# Patient Record
Sex: Male | Born: 1960 | Race: Black or African American | Hispanic: No | Smoking: Current every day smoker
Health system: Southern US, Community
[De-identification: ages and names within clinical notes are randomized; demographics above are authoritative.]

## PROBLEM LIST (undated history)

## (undated) DIAGNOSIS — N1831 Chronic kidney disease, stage 3a: Secondary | ICD-10-CM

## (undated) DIAGNOSIS — I214 Non-ST elevation (NSTEMI) myocardial infarction: Secondary | ICD-10-CM

## (undated) DIAGNOSIS — Z8673 Personal history of transient ischemic attack (TIA), and cerebral infarction without residual deficits: Secondary | ICD-10-CM

## (undated) DIAGNOSIS — F1910 Other psychoactive substance abuse, uncomplicated: Secondary | ICD-10-CM

## (undated) DIAGNOSIS — J45909 Unspecified asthma, uncomplicated: Secondary | ICD-10-CM

## (undated) DIAGNOSIS — F329 Major depressive disorder, single episode, unspecified: Secondary | ICD-10-CM

## (undated) DIAGNOSIS — Z9861 Coronary angioplasty status: Principal | ICD-10-CM

## (undated) DIAGNOSIS — G4733 Obstructive sleep apnea (adult) (pediatric): Secondary | ICD-10-CM

## (undated) DIAGNOSIS — M5030 Other cervical disc degeneration, unspecified cervical region: Secondary | ICD-10-CM

## (undated) DIAGNOSIS — I2699 Other pulmonary embolism without acute cor pulmonale: Secondary | ICD-10-CM

## (undated) DIAGNOSIS — Z7901 Long term (current) use of anticoagulants: Secondary | ICD-10-CM

## (undated) DIAGNOSIS — Z955 Presence of coronary angioplasty implant and graft: Secondary | ICD-10-CM

## (undated) DIAGNOSIS — F32A Depression, unspecified: Secondary | ICD-10-CM

## (undated) DIAGNOSIS — I10 Essential (primary) hypertension: Secondary | ICD-10-CM

## (undated) DIAGNOSIS — I2510 Atherosclerotic heart disease of native coronary artery without angina pectoris: Principal | ICD-10-CM

## (undated) HISTORY — DX: Personal history of transient ischemic attack (TIA), and cerebral infarction without residual deficits: Z86.73

## (undated) HISTORY — PX: TUMOR REMOVAL: SHX12

## (undated) HISTORY — DX: Atherosclerotic heart disease of native coronary artery without angina pectoris: I25.10

## (undated) HISTORY — DX: Coronary angioplasty status: Z98.61

## (undated) HISTORY — DX: Obstructive sleep apnea (adult) (pediatric): G47.33

## (undated) HISTORY — DX: Presence of coronary angioplasty implant and graft: Z95.5

## (undated) HISTORY — DX: Other pulmonary embolism without acute cor pulmonale: I26.99

## (undated) HISTORY — DX: Other psychoactive substance abuse, uncomplicated: F19.10

---

## 2009-12-10 ENCOUNTER — Emergency Department (HOSPITAL_COMMUNITY): Admission: EM | Admit: 2009-12-10 | Discharge: 2009-12-10 | Payer: Self-pay | Admitting: Emergency Medicine

## 2010-10-22 LAB — CBC
HCT: 42.9 % (ref 39.0–52.0)
MCHC: 34.8 g/dL (ref 30.0–36.0)
MCV: 94.1 fL (ref 78.0–100.0)
Platelets: 221 10*3/uL (ref 150–400)
RBC: 4.56 MIL/uL (ref 4.22–5.81)
RDW: 13.1 % (ref 11.5–15.5)

## 2010-10-22 LAB — POCT I-STAT, CHEM 8
BUN: 9 mg/dL (ref 6–23)
Calcium, Ion: 1.13 mmol/L (ref 1.12–1.32)
Creatinine, Ser: 1 mg/dL (ref 0.4–1.5)
Glucose, Bld: 91 mg/dL (ref 70–99)
HCT: 47 % (ref 39.0–52.0)
Potassium: 4.1 mEq/L (ref 3.5–5.1)
TCO2: 25 mmol/L (ref 0–100)

## 2010-10-22 LAB — POCT CARDIAC MARKERS: Troponin i, poc: <0.05 ng/mL (ref 0.00–0.09)

## 2010-10-22 LAB — RAPID URINE DRUG SCREEN, HOSP PERFORMED: Opiates: NOT DETECTED

## 2011-04-09 ENCOUNTER — Emergency Department (HOSPITAL_COMMUNITY): Payer: Medicaid Other

## 2011-04-09 ENCOUNTER — Emergency Department (HOSPITAL_COMMUNITY)
Admission: EM | Admit: 2011-04-09 | Discharge: 2011-04-09 | Disposition: A | Discharge status: Home / Self Care | Payer: Medicaid Other | Attending: Emergency Medicine | Admitting: Emergency Medicine

## 2011-04-09 DIAGNOSIS — R0602 Shortness of breath: Secondary | ICD-10-CM | POA: Insufficient documentation

## 2011-04-09 DIAGNOSIS — E119 Type 2 diabetes mellitus without complications: Secondary | ICD-10-CM | POA: Insufficient documentation

## 2011-04-09 DIAGNOSIS — R071 Chest pain on breathing: Secondary | ICD-10-CM | POA: Insufficient documentation

## 2011-04-09 DIAGNOSIS — I10 Essential (primary) hypertension: Secondary | ICD-10-CM | POA: Insufficient documentation

## 2011-04-09 LAB — COMPREHENSIVE METABOLIC PANEL
BUN: 20 mg/dL (ref 6–23)
Calcium: 9.6 mg/dL (ref 8.4–10.5)
Creatinine, Ser: 1.08 mg/dL (ref 0.50–1.35)
GFR calc Af Amer: >60 mL/min (ref >60)
GFR calc non Af Amer: >60 mL/min (ref >60)
Potassium: 3.5 mEq/L (ref 3.5–5.1)
Sodium: 142 mEq/L (ref 135–145)
Total Bilirubin: 0.5 mg/dL (ref 0.3–1.2)
Total Protein: 6.5 g/dL (ref 6.0–8.3)

## 2011-04-09 LAB — RAPID URINE DRUG SCREEN, HOSP PERFORMED
Amphetamines: NOT DETECTED
Opiates: NOT DETECTED
Tetrahydrocannabinol: POSITIVE — AB

## 2011-04-09 LAB — POCT I-STAT TROPONIN I

## 2011-11-17 ENCOUNTER — Emergency Department (HOSPITAL_COMMUNITY): Payer: Medicaid Other

## 2011-11-17 ENCOUNTER — Encounter (HOSPITAL_COMMUNITY): Payer: Self-pay | Admitting: Emergency Medicine

## 2011-11-17 ENCOUNTER — Emergency Department (HOSPITAL_COMMUNITY)
Admission: EM | Admit: 2011-11-17 | Discharge: 2011-11-17 | Disposition: A | Discharge status: Home / Self Care | Payer: Medicaid Other | Attending: Emergency Medicine | Admitting: Emergency Medicine

## 2011-11-17 DIAGNOSIS — M25519 Pain in unspecified shoulder: Secondary | ICD-10-CM | POA: Insufficient documentation

## 2011-11-17 DIAGNOSIS — F329 Major depressive disorder, single episode, unspecified: Secondary | ICD-10-CM | POA: Insufficient documentation

## 2011-11-17 DIAGNOSIS — R209 Unspecified disturbances of skin sensation: Secondary | ICD-10-CM | POA: Insufficient documentation

## 2011-11-17 DIAGNOSIS — M79609 Pain in unspecified limb: Secondary | ICD-10-CM | POA: Insufficient documentation

## 2011-11-17 DIAGNOSIS — R29898 Other symptoms and signs involving the musculoskeletal system: Secondary | ICD-10-CM | POA: Insufficient documentation

## 2011-11-17 DIAGNOSIS — M5090 Cervical disc disorder, unspecified, unspecified cervical region: Secondary | ICD-10-CM | POA: Insufficient documentation

## 2011-11-17 DIAGNOSIS — M542 Cervicalgia: Secondary | ICD-10-CM | POA: Insufficient documentation

## 2011-11-17 DIAGNOSIS — F3289 Other specified depressive episodes: Secondary | ICD-10-CM | POA: Insufficient documentation

## 2011-11-17 HISTORY — DX: Depression, unspecified: F32.A

## 2011-11-17 HISTORY — DX: Major depressive disorder, single episode, unspecified: F32.9

## 2011-11-17 LAB — DIFFERENTIAL
Basophils Absolute: 0 10*3/uL (ref 0.0–0.1)
Eosinophils Absolute: 0.4 10*3/uL (ref 0.0–0.7)
Eosinophils Relative: 4 % (ref 0–5)
Monocytes Absolute: 0.5 10*3/uL (ref 0.1–1.0)
Monocytes Relative: 6 % (ref 3–12)
Neutrophils Relative %: 56 % (ref 43–77)

## 2011-11-17 LAB — BASIC METABOLIC PANEL
BUN: 16 mg/dL (ref 6–23)
CO2: 25 mEq/L (ref 19–32)
Calcium: 8.9 mg/dL (ref 8.4–10.5)
Creatinine, Ser: 1.17 mg/dL (ref 0.50–1.35)
GFR calc non Af Amer: 72 mL/min — ABNORMAL LOW (ref >90)
Glucose, Bld: 91 mg/dL (ref 70–99)

## 2011-11-17 LAB — CBC
MCH: 32.1 pg (ref 26.0–34.0)
MCHC: 35.3 g/dL (ref 30.0–36.0)
RBC: 4.27 MIL/uL (ref 4.22–5.81)
RDW: 13.1 % (ref 11.5–15.5)
WBC: 9.2 10*3/uL (ref 4.0–10.5)

## 2011-11-17 MED ORDER — KETOROLAC TROMETHAMINE 30 MG/ML IJ SOLN
30.0000 mg | Freq: Once | INTRAMUSCULAR | Status: AC
  Administered 2011-11-17: 30 mg via INTRAVENOUS
  Filled 2011-11-17: qty 1

## 2011-11-17 MED ORDER — ONDANSETRON HCL 4 MG/2ML IJ SOLN
4.0000 mg | Freq: Once | INTRAMUSCULAR | Status: AC
  Administered 2011-11-17: 4 mg via INTRAVENOUS
  Filled 2011-11-17: qty 2

## 2011-11-17 MED ORDER — OXYCODONE-ACETAMINOPHEN 5-325 MG PO TABS
2.0000 | ORAL_TABLET | Freq: Once | ORAL | Status: AC
  Administered 2011-11-17: 2 via ORAL
  Filled 2011-11-17: qty 2

## 2011-11-17 MED ORDER — MORPHINE SULFATE 4 MG/ML IJ SOLN
4.0000 mg | Freq: Once | INTRAMUSCULAR | Status: AC
  Administered 2011-11-17: 4 mg via INTRAVENOUS
  Filled 2011-11-17: qty 1

## 2011-11-17 MED ORDER — PREDNISONE 10 MG PO TABS: 20.0000 mg | ORAL_TABLET | Freq: Two times a day (BID) | ORAL | Status: DC

## 2011-11-17 MED ORDER — METHYLPREDNISOLONE SODIUM SUCC 125 MG IJ SOLR
125.0000 mg | Freq: Once | INTRAMUSCULAR | Status: AC
  Administered 2011-11-17: 125 mg via INTRAVENOUS
  Filled 2011-11-17: qty 2

## 2011-11-17 MED ORDER — LORAZEPAM 2 MG/ML IJ SOLN
1.0000 mg | Freq: Once | INTRAMUSCULAR | Status: AC
  Administered 2011-11-17: 1 mg via INTRAVENOUS
  Filled 2011-11-17: qty 1

## 2011-11-17 MED ORDER — HYDROCODONE-ACETAMINOPHEN 5-325 MG PO TABS: 1.0000 | ORAL_TABLET | Freq: Three times a day (TID) | ORAL | Status: AC | PRN

## 2011-11-17 NOTE — ED Notes (Signed)
Cp for 2days and progressively got worst this evening, CP with numbness and tingling to left side. Pain scale 10/10, ASA 324 mg  And 0.4nitro SL given by EMS.

## 2011-11-17 NOTE — ED Provider Notes (Signed)
Pt has a cervical disc protrusion.  Spoke with dr. Dutch Quint who will see the pt in the office in 2 days.  Pt to begin steroids  Benny Lennert, MD 11/17/11 [phone]
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
## 2011-11-17 NOTE — Discharge Instructions (Signed)
Follow up with dr. Dutch Quint in 2-3 days

## 2011-11-17 NOTE — ED Notes (Signed)
Patient transported to MRI 

## 2011-11-21 ENCOUNTER — Encounter (HOSPITAL_COMMUNITY): Payer: Self-pay | Admitting: Pharmacy Technician

## 2011-11-24 ENCOUNTER — Other Ambulatory Visit: Payer: Self-pay | Admitting: Neurosurgery

## 2011-11-24 NOTE — Pre-Procedure Instructions (Signed)
20 Timothy Peck  11/24/2011   Your procedure is scheduled on:  Friday November 28, 2011  Report to [facility] at 1:20pm .  Call this number if you have problems the morning of surgery: [phone]   Remember:   Do not eat food:After Midnight.  May have clear liquids: up to 4 Hours before arrival. (up to 9:20am)  Clear liquids include soda, tea, black coffee, apple or grape juice, broth.  Take these medicines the morning of surgery with A SIP OF WATER: hydrocodone, prednisone, paxil, seroquel     Do not wear lotions, powders, or perfumes. You may wear deodorant.  Do not shave 48 hours prior to surgery.  Do not bring valuables to the hospital.  Contacts, dentures or bridgework may not be worn into surgery.  Leave suitcase in the car. After surgery it may be brought to your room.  For patients admitted to the hospital, checkout time is 11:00 AM the day of discharge.   Patients discharged the day of surgery will not be allowed to drive home.  Name and phone number of your driver:   Special Instructions: CHG Shower Use Special Wash: 1/2 bottle night before surgery and 1/2 bottle morning of surgery.   Please read over the following fact sheets that you were given: Pain Booklet, Coughing and Deep Breathing, MRSA Information and Surgical Site Infection Prevention

## 2011-11-25 ENCOUNTER — Encounter (HOSPITAL_COMMUNITY): Payer: Self-pay

## 2011-11-25 ENCOUNTER — Encounter (HOSPITAL_COMMUNITY)
Admission: RE | Admit: 2011-11-25 | Discharge: 2011-11-25 | Disposition: A | Discharge status: Home / Self Care | Payer: Medicaid Other | Source: Ambulatory Visit | Attending: Neurosurgery | Admitting: Neurosurgery

## 2011-11-25 HISTORY — DX: Other cervical disc degeneration, unspecified cervical region: M50.30

## 2011-11-25 LAB — COMPREHENSIVE METABOLIC PANEL
Albumin: 4 g/dL (ref 3.5–5.2)
Alkaline Phosphatase: 66 U/L (ref 39–117)
BUN: 20 mg/dL (ref 6–23)
Calcium: 9.5 mg/dL (ref 8.4–10.5)
Creatinine, Ser: 1.28 mg/dL (ref 0.50–1.35)
GFR calc Af Amer: 75 mL/min — ABNORMAL LOW (ref >90)
Glucose, Bld: 80 mg/dL (ref 70–99)
Potassium: 4.1 mEq/L (ref 3.5–5.1)
Total Protein: 7 g/dL (ref 6.0–8.3)

## 2011-11-25 LAB — CBC
HCT: 44.7 % (ref 39.0–52.0)
Hemoglobin: 15.6 g/dL (ref 13.0–17.0)
MCV: 92.9 fL (ref 78.0–100.0)
RBC: 4.81 MIL/uL (ref 4.22–5.81)
WBC: 11 10*3/uL — ABNORMAL HIGH (ref 4.0–10.5)

## 2011-11-25 LAB — DIFFERENTIAL
Eosinophils Absolute: 0.4 10*3/uL (ref 0.0–0.7)
Eosinophils Relative: 3 % (ref 0–5)
Lymphocytes Relative: 29 % (ref 12–46)
Lymphs Abs: 3.2 10*3/uL (ref 0.7–4.0)
Monocytes Absolute: 0.9 10*3/uL (ref 0.1–1.0)

## 2011-11-25 LAB — SURGICAL PCR SCREEN: MRSA, PCR: NEGATIVE

## 2011-11-28 ENCOUNTER — Encounter (HOSPITAL_COMMUNITY): Payer: Self-pay | Admitting: Anesthesiology

## 2011-11-28 ENCOUNTER — Inpatient Hospital Stay (HOSPITAL_COMMUNITY)
Admission: RE | Admit: 2011-11-28 | Discharge: 2011-11-29 | DRG: 473 | Disposition: A | Discharge status: Home / Self Care | Payer: Medicaid Other | Source: Ambulatory Visit | Attending: Neurosurgery | Admitting: Neurosurgery

## 2011-11-28 ENCOUNTER — Ambulatory Visit (HOSPITAL_COMMUNITY): Payer: Medicaid Other | Admitting: Anesthesiology

## 2011-11-28 ENCOUNTER — Encounter (HOSPITAL_COMMUNITY)
Admission: RE | Disposition: A | Discharge status: Home / Self Care | Payer: Self-pay | Source: Ambulatory Visit | Attending: Neurosurgery

## 2011-11-28 ENCOUNTER — Encounter (HOSPITAL_COMMUNITY): Payer: Self-pay | Admitting: *Deleted

## 2011-11-28 ENCOUNTER — Ambulatory Visit (HOSPITAL_COMMUNITY): Payer: Medicaid Other

## 2011-11-28 DIAGNOSIS — M5020 Other cervical disc displacement, unspecified cervical region: Principal | ICD-10-CM | POA: Diagnosis present

## 2011-11-28 DIAGNOSIS — F329 Major depressive disorder, single episode, unspecified: Secondary | ICD-10-CM | POA: Diagnosis present

## 2011-11-28 DIAGNOSIS — F1410 Cocaine abuse, uncomplicated: Secondary | ICD-10-CM | POA: Diagnosis present

## 2011-11-28 DIAGNOSIS — F17200 Nicotine dependence, unspecified, uncomplicated: Secondary | ICD-10-CM | POA: Diagnosis present

## 2011-11-28 DIAGNOSIS — IMO0002 Reserved for concepts with insufficient information to code with codable children: Secondary | ICD-10-CM

## 2011-11-28 DIAGNOSIS — M5010 Cervical disc disorder with radiculopathy, unspecified cervical region: Secondary | ICD-10-CM | POA: Diagnosis present

## 2011-11-28 DIAGNOSIS — Z01812 Encounter for preprocedural laboratory examination: Secondary | ICD-10-CM

## 2011-11-28 DIAGNOSIS — F3289 Other specified depressive episodes: Secondary | ICD-10-CM | POA: Diagnosis present

## 2011-11-28 DIAGNOSIS — F1210 Cannabis abuse, uncomplicated: Secondary | ICD-10-CM | POA: Diagnosis present

## 2011-11-28 HISTORY — PX: ANTERIOR CERVICAL DECOMP/DISCECTOMY FUSION: SHX1161

## 2011-11-28 SURGERY — ANTERIOR CERVICAL DECOMPRESSION/DISCECTOMY FUSION 1 LEVEL
Anesthesia: General | Site: Neck | Wound class: Clean

## 2011-11-28 MED ORDER — 0.9 % SODIUM CHLORIDE (POUR BTL) OPTIME
TOPICAL | Status: DC | PRN
  Administered 2011-11-28: 1000 mL

## 2011-11-28 MED ORDER — PROPOFOL 10 MG/ML IV BOLUS
INTRAVENOUS | Status: DC | PRN
  Administered 2011-11-28: 50 mg via INTRAVENOUS
  Administered 2011-11-28: 150 mg via INTRAVENOUS

## 2011-11-28 MED ORDER — ALUM & MAG HYDROXIDE-SIMETH 200-200-20 MG/5ML PO SUSP: 30.0000 mL | Freq: Four times a day (QID) | ORAL | Status: DC | PRN

## 2011-11-28 MED ORDER — FENTANYL CITRATE 0.05 MG/ML IJ SOLN
INTRAMUSCULAR | Status: DC | PRN
  Administered 2011-11-28: 125 ug via INTRAVENOUS
  Administered 2011-11-28 (×2): 100 ug via INTRAVENOUS
  Administered 2011-11-28: 125 ug via INTRAVENOUS
  Administered 2011-11-28: 50 ug via INTRAVENOUS

## 2011-11-28 MED ORDER — ROCURONIUM BROMIDE 100 MG/10ML IV SOLN
INTRAVENOUS | Status: DC | PRN
  Administered 2011-11-28: 50 mg via INTRAVENOUS

## 2011-11-28 MED ORDER — POLYETHYLENE GLYCOL 3350 17 G PO PACK
17.0000 g | PACK | Freq: Every day | ORAL | Status: DC | PRN
  Filled 2011-11-28: qty 1

## 2011-11-28 MED ORDER — PHENOL 1.4 % MT LIQD: 1.0000 | OROMUCOSAL | Status: DC | PRN

## 2011-11-28 MED ORDER — HYDROMORPHONE HCL PF 1 MG/ML IJ SOLN: 0.2500 mg | INTRAMUSCULAR | Status: DC | PRN

## 2011-11-28 MED ORDER — BACITRACIN 50000 UNITS IM SOLR
INTRAMUSCULAR | Status: AC
  Filled 2011-11-28: qty 1

## 2011-11-28 MED ORDER — ONDANSETRON HCL 4 MG/2ML IJ SOLN: 4.0000 mg | Freq: Once | INTRAMUSCULAR | Status: DC | PRN

## 2011-11-28 MED ORDER — FLEET ENEMA 7-19 GM/118ML RE ENEM
1.0000 | ENEMA | Freq: Once | RECTAL | Status: AC | PRN
  Filled 2011-11-28: qty 1

## 2011-11-28 MED ORDER — CEFAZOLIN SODIUM 1-5 GM-% IV SOLN
1.0000 g | Freq: Three times a day (TID) | INTRAVENOUS | Status: DC
  Administered 2011-11-29: 1 g via INTRAVENOUS
  Filled 2011-11-28 (×2): qty 50

## 2011-11-28 MED ORDER — ONDANSETRON HCL 4 MG/2ML IJ SOLN
4.0000 mg | INTRAMUSCULAR | Status: DC | PRN
  Administered 2011-11-28 – 2011-11-29 (×2): 4 mg via INTRAVENOUS
  Filled 2011-11-28 (×2): qty 2

## 2011-11-28 MED ORDER — PAROXETINE HCL 10 MG PO TABS
10.0000 mg | ORAL_TABLET | ORAL | Status: DC
  Administered 2011-11-29: 10 mg via ORAL
  Filled 2011-11-28 (×2): qty 1

## 2011-11-28 MED ORDER — ZOLPIDEM TARTRATE 5 MG PO TABS: 10.0000 mg | ORAL_TABLET | Freq: Every evening | ORAL | Status: DC | PRN

## 2011-11-28 MED ORDER — HYDROMORPHONE HCL PF 1 MG/ML IJ SOLN
0.2500 mg | INTRAMUSCULAR | Status: DC | PRN
  Administered 2011-11-28: 0.5 mg via INTRAVENOUS

## 2011-11-28 MED ORDER — ACETAMINOPHEN 650 MG RE SUPP: 650.0000 mg | RECTAL | Status: DC | PRN

## 2011-11-28 MED ORDER — BISACODYL 10 MG RE SUPP: 10.0000 mg | Freq: Every day | RECTAL | Status: DC | PRN

## 2011-11-28 MED ORDER — SODIUM CHLORIDE 0.9 % IV SOLN
INTRAVENOUS | Status: AC
  Filled 2011-11-28: qty 500

## 2011-11-28 MED ORDER — SODIUM CHLORIDE 0.9 % IV SOLN: 250.0000 mL | INTRAVENOUS | Status: DC

## 2011-11-28 MED ORDER — MORPHINE SULFATE 2 MG/ML IJ SOLN: 0.0500 mg/kg | INTRAMUSCULAR | Status: DC | PRN

## 2011-11-28 MED ORDER — ACETAMINOPHEN 325 MG PO TABS: 650.0000 mg | ORAL_TABLET | ORAL | Status: DC | PRN

## 2011-11-28 MED ORDER — GLYCOPYRROLATE 0.2 MG/ML IJ SOLN
INTRAMUSCULAR | Status: DC | PRN
  Administered 2011-11-28: 0.4 mg via INTRAVENOUS

## 2011-11-28 MED ORDER — MENTHOL 3 MG MT LOZG: 1.0000 | LOZENGE | OROMUCOSAL | Status: DC | PRN

## 2011-11-28 MED ORDER — LACTATED RINGERS IV SOLN
INTRAVENOUS | Status: DC | PRN
  Administered 2011-11-28 (×2): via INTRAVENOUS

## 2011-11-28 MED ORDER — HEMOSTATIC AGENTS (NO CHARGE) OPTIME
TOPICAL | Status: DC | PRN
  Administered 2011-11-28: 1 via TOPICAL

## 2011-11-28 MED ORDER — SENNA 8.6 MG PO TABS
1.0000 | ORAL_TABLET | Freq: Two times a day (BID) | ORAL | Status: DC
  Filled 2011-11-28 (×2): qty 1

## 2011-11-28 MED ORDER — HYDROMORPHONE HCL PF 1 MG/ML IJ SOLN
0.5000 mg | INTRAMUSCULAR | Status: DC | PRN
  Administered 2011-11-28 – 2011-11-29 (×2): 1 mg via INTRAVENOUS
  Filled 2011-11-28 (×2): qty 1

## 2011-11-28 MED ORDER — SODIUM CHLORIDE 0.9 % IR SOLN
Status: DC | PRN
  Administered 2011-11-28: 19:00:00

## 2011-11-28 MED ORDER — OXYCODONE-ACETAMINOPHEN 5-325 MG PO TABS
1.0000 | ORAL_TABLET | ORAL | Status: DC | PRN
  Administered 2011-11-29 (×2): 2 via ORAL
  Filled 2011-11-28 (×2): qty 2

## 2011-11-28 MED ORDER — MIDAZOLAM HCL 5 MG/5ML IJ SOLN
INTRAMUSCULAR | Status: DC | PRN
  Administered 2011-11-28: 2 mg via INTRAVENOUS

## 2011-11-28 MED ORDER — CEFAZOLIN SODIUM 1-5 GM-% IV SOLN
INTRAVENOUS | Status: DC | PRN
  Administered 2011-11-28: 2 g via INTRAVENOUS

## 2011-11-28 MED ORDER — THROMBIN 5000 UNITS EX SOLR
CUTANEOUS | Status: DC | PRN
  Administered 2011-11-28 (×2): 5000 [IU] via TOPICAL

## 2011-11-28 MED ORDER — SODIUM CHLORIDE 0.9 % IJ SOLN: 3.0000 mL | Freq: Two times a day (BID) | INTRAMUSCULAR | Status: DC

## 2011-11-28 MED ORDER — CYCLOBENZAPRINE HCL 10 MG PO TABS
10.0000 mg | ORAL_TABLET | Freq: Three times a day (TID) | ORAL | Status: DC | PRN
  Administered 2011-11-29 (×2): 10 mg via ORAL
  Filled 2011-11-28 (×2): qty 1

## 2011-11-28 MED ORDER — DROPERIDOL 2.5 MG/ML IJ SOLN
0.6250 mg | INTRAMUSCULAR | Status: DC | PRN
  Filled 2011-11-28: qty 0.25

## 2011-11-28 MED ORDER — HYDROCODONE-ACETAMINOPHEN 5-325 MG PO TABS: 1.0000 | ORAL_TABLET | ORAL | Status: DC | PRN

## 2011-11-28 MED ORDER — SODIUM CHLORIDE 0.9 % IJ SOLN: 3.0000 mL | INTRAMUSCULAR | Status: DC | PRN

## 2011-11-28 MED ORDER — NEOSTIGMINE METHYLSULFATE 1 MG/ML IJ SOLN
INTRAMUSCULAR | Status: DC | PRN
  Administered 2011-11-28: 2 mg via INTRAVENOUS

## 2011-11-28 MED ORDER — QUETIAPINE FUMARATE ER 300 MG PO TB24
300.0000 mg | ORAL_TABLET | Freq: Every day | ORAL | Status: DC
Start: 2011-11-28 — End: 2011-11-29
  Filled 2011-11-28 (×2): qty 1

## 2011-11-28 SURGICAL SUPPLY — 54 items
BAG DECANTER FOR FLEXI CONT (MISCELLANEOUS) ×2 IMPLANT
BENZOIN TINCTURE PRP APPL 2/3 (GAUZE/BANDAGES/DRESSINGS) ×2 IMPLANT
BRUSH SCRUB EZ PLAIN DRY (MISCELLANEOUS) ×2 IMPLANT
BUR MATCHSTICK NEURO 3.0 LAGG (BURR) ×2 IMPLANT
CANISTER SUCTION 2500CC (MISCELLANEOUS) ×2 IMPLANT
CLOTH BEACON ORANGE TIMEOUT ST (SAFETY) ×2 IMPLANT
CONT SPEC 4OZ CLIKSEAL STRL BL (MISCELLANEOUS) ×2 IMPLANT
DRAPE C-ARM 42X72 X-RAY (DRAPES) ×4 IMPLANT
DRAPE INCISE IOBAN 66X45 STRL (DRAPES) ×2 IMPLANT
DRAPE LAPAROTOMY 100X72 PEDS (DRAPES) ×2 IMPLANT
DRAPE MICROSCOPE ZEISS OPMI (DRAPES) ×2 IMPLANT
DRAPE POUCH INSTRU U-SHP 10X18 (DRAPES) ×2 IMPLANT
DRILL BIT (BIT) ×2 IMPLANT
ELECT COATED BLADE 2.86 ST (ELECTRODE) ×2 IMPLANT
ELECT REM PT RETURN 9FT ADLT (ELECTROSURGICAL) ×2
ELECTRODE REM PT RTRN 9FT ADLT (ELECTROSURGICAL) ×1 IMPLANT
GAUZE SPONGE 4X4 16PLY XRAY LF (GAUZE/BANDAGES/DRESSINGS) IMPLANT
GLOVE BIO SURGEON STRL SZ 6.5 (GLOVE) ×2 IMPLANT
GLOVE BIOGEL PI IND STRL 6.5 (GLOVE) ×1 IMPLANT
GLOVE BIOGEL PI IND STRL 7.5 (GLOVE) ×1 IMPLANT
GLOVE BIOGEL PI INDICATOR 6.5 (GLOVE) ×1
GLOVE BIOGEL PI INDICATOR 7.5 (GLOVE) ×1
GLOVE ECLIPSE 8.5 STRL (GLOVE) ×2 IMPLANT
GLOVE EXAM NITRILE LRG STRL (GLOVE) IMPLANT
GLOVE EXAM NITRILE MD LF STRL (GLOVE) IMPLANT
GLOVE EXAM NITRILE XL STR (GLOVE) IMPLANT
GLOVE EXAM NITRILE XS STR PU (GLOVE) IMPLANT
GOWN BRE IMP SLV AUR LG STRL (GOWN DISPOSABLE) ×2 IMPLANT
GOWN BRE IMP SLV AUR XL STRL (GOWN DISPOSABLE) ×4 IMPLANT
GOWN STRL REIN 2XL LVL4 (GOWN DISPOSABLE) IMPLANT
HEAD HALTER (SOFTGOODS) ×2 IMPLANT
KIT BASIN OR (CUSTOM PROCEDURE TRAY) ×2 IMPLANT
KIT ROOM TURNOVER OR (KITS) ×2 IMPLANT
NEEDLE SPNL 20GX3.5 QUINCKE YW (NEEDLE) ×2 IMPLANT
NS IRRIG 1000ML POUR BTL (IV SOLUTION) ×2 IMPLANT
PACK LAMINECTOMY NEURO (CUSTOM PROCEDURE TRAY) ×4 IMPLANT
PAD ARMBOARD 7.5X6 YLW CONV (MISCELLANEOUS) ×4 IMPLANT
PLATE ELITE VISION 25MM (Plate) ×2 IMPLANT
RUBBERBAND STERILE (MISCELLANEOUS) ×4 IMPLANT
SCREW ST 13X4XST VA NS SPNE (Screw) ×4 IMPLANT
SCREW ST VAR 4 ATL (Screw) ×4 IMPLANT
SPACER BONE CORNERSTONE 8X14 (Orthopedic Implant) ×2 IMPLANT
SPONGE GAUZE 4X4 12PLY (GAUZE/BANDAGES/DRESSINGS) ×2 IMPLANT
SPONGE INTESTINAL PEANUT (DISPOSABLE) ×2 IMPLANT
SPONGE SURGIFOAM ABS GEL SZ50 (HEMOSTASIS) ×2 IMPLANT
STRIP CLOSURE SKIN 1/2X4 (GAUZE/BANDAGES/DRESSINGS) ×2 IMPLANT
SUT PDS AB 5-0 P3 18 (SUTURE) ×2 IMPLANT
SUT VIC AB 3-0 SH 8-18 (SUTURE) ×2 IMPLANT
SYR 20ML ECCENTRIC (SYRINGE) ×2 IMPLANT
TAPE CLOTH 4X10 WHT NS (GAUZE/BANDAGES/DRESSINGS) ×2 IMPLANT
TAPE CLOTH SURG 4X10 WHT LF (GAUZE/BANDAGES/DRESSINGS) ×2 IMPLANT
TOWEL OR 17X24 6PK STRL BLUE (TOWEL DISPOSABLE) ×2 IMPLANT
TOWEL OR 17X26 10 PK STRL BLUE (TOWEL DISPOSABLE) ×2 IMPLANT
WATER STERILE IRR 1000ML POUR (IV SOLUTION) ×2 IMPLANT

## 2011-11-28 NOTE — H&P (Signed)
Timothy Peck is an 51 y.o. male.   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 Patient presents now for anterior cervical discectomy and fusion with allograft and anterior plating in hopes of improving his symptoms.  Past Medical History  Diagnosis Date  . Depression   . Degenerative disc disease, cervical     Dx years ago    Past Surgical History  Procedure Date  . Tumor removal     left foot third toe    Family History  Problem Relation Age of Onset  . Anesthesia problems Neg Hx   . Hypotension Neg Hx   . Malignant hyperthermia Neg Hx   . Pseudochol deficiency Neg Hx    Social History:  reports that he has been smoking Cigarettes.  He has a 4.5 pack-year smoking history. He has never used smokeless tobacco. He reports that he drinks alcohol. He reports that he uses illicit drugs (Cocaine and Marijuana).  Allergies: No Known Allergies  Medications Prior to Admission  Medication Sig Dispense Refill  . HYDROcodone-acetaminophen (NORCO) 5-325 MG per tablet Take 1 tablet by mouth every 8 (eight) hours as needed for pain.  20 tablet  0  . ibuprofen (ADVIL,MOTRIN) 200 MG tablet Take 800 mg by mouth every 6 (six) hours as needed. For pain      . Multiple Vitamin (MULITIVITAMIN WITH MINERALS) TABS Take 1 tablet by mouth daily.      Marland Kitchen oxyCODONE-acetaminophen (PERCOCET) 5-325 MG per tablet Take 1-2 tablets by mouth every 4 (four) hours as needed. For pain      . PARoxetine (PAXIL) 10 MG tablet Take 10 mg by mouth every morning.      . predniSONE (DELTASONE) 10 MG tablet Take 2 tablets (20 mg total) by mouth 2 (two) times daily.  15 tablet  0  . QUEtiapine (SEROQUEL XR) 300 MG 24 hr tablet Take 300 mg by mouth at bedtime.      . Ascorbic Acid (VITAMIN C) 1000 MG tablet Take 1,000 mg by mouth daily.          No results found for this or any previous visit (from the past 48 hour(s)). No results found.  Review of Systems  Constitutional: Negative.   HENT: Negative.   Eyes: Negative.   Respiratory: Negative.   Cardiovascular: Negative.   Gastrointestinal: Negative.   Genitourinary: Negative.   Musculoskeletal: Negative.   Skin: Negative.   Neurological: Negative.   Endo/Heme/Allergies: Negative.   Psychiatric/Behavioral: Negative.     Blood pressure 151/92, pulse 47, temperature 97.9 F (36.6 C), temperature source Oral, resp. rate 18, SpO2 100.00%. Physical Exam  Constitutional: He is oriented to person, place, and time. He appears well-developed and well-nourished.  HENT:  Head: Normocephalic and atraumatic.  Right Ear: External ear normal.  Left Ear: External ear normal.  Nose: Nose normal.  Mouth/Throat: Oropharynx is clear and moist.  Eyes: Conjunctivae and EOM are normal. Pupils are equal, round, and reactive to light.  Neck: Normal range of motion. Neck supple. No tracheal deviation present. No thyromegaly present.  Cardiovascular: Normal rate, regular rhythm, normal heart sounds and intact distal pulses.   Respiratory: Effort normal and breath sounds normal. No respiratory distress. He has no wheezes.  GI: Soft. Bowel sounds are normal. He exhibits no distension. There is no tenderness.  Musculoskeletal: Normal  range of motion.  Neurological: He is alert and oriented to person, place, and time. He has normal reflexes. He displays normal reflexes. No cranial nerve deficit. He exhibits normal muscle tone. Coordination normal.  Skin: Skin is warm and dry. No rash noted. No erythema. No pallor.  Psychiatric: He has a normal mood and affect. His behavior is normal. Judgment and thought content normal.     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 Plan C6-7 anterior cervical discectomy and fusion with allograft and anterior plating. Risks and  benefits have been explained. Patient wishes to proceed.  Moesha Sarchet A 11/28/2011, 12:38 PM

## 2011-11-28 NOTE — Anesthesia Procedure Notes (Signed)
Procedure Name: Intubation Date/Time: 11/28/2011 6:58 PM Performed by: Alanda Amass A Pre-anesthesia Checklist: Patient identified, Timeout performed, Emergency Drugs available, Patient being monitored and Suction available Patient Re-evaluated:Patient Re-evaluated prior to inductionOxygen Delivery Method: Circle system utilized Preoxygenation: Pre-oxygenation with 100% oxygen Intubation Type: IV induction Ventilation: Mask ventilation without difficulty Laryngoscope Size: Mac and 3 Grade View: Grade II Tube type: Oral Tube size: 7.5 mm Number of attempts: 1 Airway Equipment and Method: Stylet Placement Confirmation: ETT inserted through vocal cords under direct vision,  positive ETCO2 and breath sounds checked- equal and bilateral Secured at: 22 cm Tube secured with: Tape Dental Injury: Teeth and Oropharynx as per pre-operative assessment

## 2011-11-28 NOTE — Brief Op Note (Signed)
11/28/2011  7:59 PM  PATIENT:  Timothy Peck  51 y.o. male  PRE-OPERATIVE DIAGNOSIS:  HNP  POST-OPERATIVE DIAGNOSIS:  HNP  PROCEDURE:  Procedure(s) (LRB): ANTERIOR CERVICAL DECOMPRESSION/DISCECTOMY FUSION 1 LEVEL (N/A)  SURGEON:  Surgeon(s) and Role:    * Temple Pacini, MD - Primary  PHYSICIAN ASSISTANT:   ASSISTANTS: none   ANESTHESIA:   general  EBL:  Total I/O In: 1400 [I.V.:1400] Out: 15 [Blood:15]  BLOOD ADMINISTERED:none  DRAINS: none   LOCAL MEDICATIONS USED:  NONE  SPECIMEN:  No Specimen  DISPOSITION OF SPECIMEN:  N/A  COUNTS:  YES  TOURNIQUET:  * No tourniquets in log *  DICTATION: .Dragon Dictation  PLAN OF CARE: Admit to inpatient   PATIENT DISPOSITION:  PACU - hemodynamically stable.   Delay start of Pharmacological VTE agent (>24hrs) due to surgical blood loss or risk of bleeding: yes

## 2011-11-28 NOTE — Preoperative (Signed)
Beta Blockers   Reason not to administer Beta Blockers:Not Applicable 

## 2011-11-28 NOTE — Transfer of Care (Signed)
Immediate Anesthesia Transfer of Care Note  Patient: Timothy Peck  Procedure(s) Performed: Procedure(s) (LRB): ANTERIOR CERVICAL DECOMPRESSION/DISCECTOMY FUSION 1 LEVEL (N/A)  Patient Location: PACU  Anesthesia Type: General  Level of Consciousness: awake  Airway & Oxygen Therapy: Patient Spontanous Breathing and Patient connected to nasal cannula oxygen  Post-op Assessment: Report given to PACU RN and Post -op Vital signs reviewed and stable  Post vital signs: Reviewed and stable  Complications: No apparent anesthesia complications

## 2011-11-28 NOTE — Anesthesia Postprocedure Evaluation (Signed)
Anesthesia Post Note  Patient: Timothy Peck  Procedure(s) Performed: Procedure(s) (LRB): ANTERIOR CERVICAL DECOMPRESSION/DISCECTOMY FUSION 1 LEVEL (N/A)  Anesthesia type: general  Patient location: PACU  Post pain: Pain level controlled  Post assessment: Patient's Cardiovascular Status Stable  Last Vitals:  Filed Vitals:   11/28/11 2055  BP: 144/61  Pulse: 51  Temp:   Resp: 9    Post vital signs: Reviewed and stable  Level of consciousness: sedated  Complications: No apparent anesthesia complications

## 2011-11-28 NOTE — Anesthesia Preprocedure Evaluation (Addendum)
Anesthesia Evaluation  Patient identified by MRN, date of birth, ID band Patient awake    Reviewed: Allergy & Precautions, H&P , NPO status , Patient's Chart, lab work & pertinent test results  Airway Mallampati: I TM Distance: >3 FB Neck ROM: Full    Dental  (+) Teeth Intact and Dental Advisory Given   Pulmonary  breath sounds clear to auscultation        Cardiovascular Rhythm:Regular Rate:Normal     Neuro/Psych    GI/Hepatic   Endo/Other    Renal/GU      Musculoskeletal   Abdominal   Peds  Hematology   Anesthesia Other Findings   Reproductive/Obstetrics                           Anesthesia Physical Anesthesia Plan  ASA: II  Anesthesia Plan: General   Post-op Pain Management:    Induction: Intravenous  Airway Management Planned: Oral ETT  Additional Equipment:   Intra-op Plan:   Post-operative Plan: Extubation in OR  Informed Consent: I have reviewed the patients History and Physical, chart, labs and discussed the procedure including the risks, benefits and alternatives for the proposed anesthesia with the patient or authorized representative who has indicated his/her understanding and acceptance.   Dental advisory given  Plan Discussed with: CRNA, Anesthesiologist and Surgeon  Anesthesia Plan Comments:         Anesthesia Quick Evaluation  

## 2011-11-28 NOTE — Op Note (Signed)
Date of procedure: 11/28/2011  Date of dictation: Same  Service: Neurosurgery  Preoperative diagnosis: Left C6-7 herniated nucleus pulposus with radiculopathy  Postoperative diagnosis: Same  Procedure Name: C6-7 anterior cervical setting fusion with allograft and anterior plating  Surgeon:Tally Mattox A.Yanuel Tagg, M.D.  Asst. Surgeon: None  Anesthesia: General  Indication: 51 year old male with history of neck and left upper journey pain paresthesias and weakness consistent with a left-sided C7 her a copy her workup and straight a large left-sided C6-7 disc herniation with spinal cord and nerve root compression. Patient presents now for C6-7 anterior cervical facet fusion with allograft and plating in hopes of improving his symptoms.  Operative note: After induction anesthesia, patient is a supine with Extended and held in place with halter traction. Patient's anterior service prepped draped sterilely. Incision was made overlying the C6-7 interspace. This carried down sharply to the platysma. This is then divided vertically and dissection proceeded on U. border of the sternocleidomastoid muscle and carotid sheath. Trachea and esophagus mobilized track towards the left. Prevertebral fascia proximal anterior spinal column. Longus colli muscles of elevated bilaterally/Carter. Deep self retaining retractors placed intraoperative fluoroscopy is used levels was confirmed. The space and incised 15 blade in a rectangular fashion. Y. disc space cleanout was achieved using pituitary rongeurs forward and backward angle Carlen curettes Kerrison rongeurs the high-speed drill. Almost the disc were removed down to the level posterior annulus. Marked and brought field these at the remainder of the discectomy. Remaining aspects of annulus and osteophytes removed using a high-speed drill. Was elevated and resected in the fashion using Kerrison rongeurs. Underlying thecal sac then identified. Wide decompression and perform  undercut the bodies of C6 and C7. Decompression and CA chauffeuring provider foraminotomies and portal along the course the exiting C7 nerve roots bilaterally probably to the disc herniation of the left side were completely resected this point a very thorough depression achieved. There is noted of injury to thecal sac and nerve roots. Wound is then irrigated. Cornerstone allograft wedges and packed in place recessed off the 1 mm and the intervertebral margin tear 25 mm Atlantis anterior cervical plate was a posterior the C6 and C7 levels. This attached under fluoroscopic guidance using 13 mm  variable angle screws to each of both levels. All screws final tightening be solidly within bone. Locking screws engaged both levels. Final images real good position the graft or proper level with normal S1. Wound is then irrigated and bike solution. Hemostasis was assured with bipolar cautery. Wounds and closed in a typical fashion. Steri-Strips and a sterile dressing were applied. The patient tolerated the procedure well. He returns to the recovery room postop.

## 2011-11-28 NOTE — Progress Notes (Signed)
Orthopedic Tech Progress Note Patient Details:  Timothy Peck 1961/02/21 [id number]  Other Ortho Devices Ortho Device Location: soft collar Ortho Device Interventions: Application   Timothy Peck, Timothy Peck 11/28/2011, 12:53 PM

## 2011-11-29 MED ORDER — DIAZEPAM 5 MG PO TABS: 5.0000 mg | ORAL_TABLET | Freq: Four times a day (QID) | ORAL | Status: DC | PRN

## 2011-11-29 MED ORDER — OXYCODONE-ACETAMINOPHEN 5-325 MG PO TABS: 1.0000 | ORAL_TABLET | ORAL | Status: DC | PRN

## 2011-11-29 NOTE — Discharge Summary (Signed)
Discharge summary: Admitting diagnosis: Herniated nucleus pulposus C6-7 left with left cervical radiculopathy Discharge diagnosis: Herniated nucleus pulposus C6-C7 left with left cervical radiculopathy Operation: Anterior cervical decompression C6-C7 arthrodesis with structural allograft, anterior plate fixation B1-Y7 Condition on discharge: Improved Discharge medications: Percocet 5/325 #40 without refills Valium 5 mg #30 without refills Hospital course: Uncomplicated

## 2011-11-29 NOTE — Discharge Instructions (Signed)
Wound Care Keep incision covered and dry for one week.  If you shower prior to then, cover incision with plastic wrap.  You may remove outer bandage after one week and shower.  Do not put any creams, lotions, or ointments on incision. Leave steri-strips on neck.  They will fall off by themselves. Activity Walk each and every day, increasing distance each day. No lifting greater than 5 lbs.  Avoid excessive neck motion. No driving for 2 weeks; may ride as a passenger locally. Wear neck brace at all times except when showering. Diet Resume your normal diet.  Return to Work Will be discussed at you follow up appointment. Call Your Doctor If Any of These Occur Redness, drainage, or swelling at the wound.  Temperature greater than 101 degrees. Severe pain not relieved by pain medication. Increased difficulty swallowing.  Incision starts to come apart. Follow Up Appt Call today for appointment in 1-2 weeks ([phone]) or for problems.  If you have any hardware placed in your spine, you will need an x-ray before your appointment. 

## 2011-12-01 ENCOUNTER — Encounter (HOSPITAL_COMMUNITY): Payer: Self-pay | Admitting: Neurosurgery

## 2011-12-07 ENCOUNTER — Encounter (HOSPITAL_COMMUNITY): Payer: Self-pay | Admitting: Emergency Medicine

## 2011-12-07 ENCOUNTER — Emergency Department (HOSPITAL_COMMUNITY)
Admission: EM | Admit: 2011-12-07 | Discharge: 2011-12-07 | Disposition: A | Discharge status: Home / Self Care | Payer: Medicaid Other | Attending: Emergency Medicine | Admitting: Emergency Medicine

## 2011-12-07 DIAGNOSIS — M542 Cervicalgia: Secondary | ICD-10-CM

## 2011-12-07 DIAGNOSIS — M5030 Other cervical disc degeneration, unspecified cervical region: Secondary | ICD-10-CM | POA: Insufficient documentation

## 2011-12-07 DIAGNOSIS — F17200 Nicotine dependence, unspecified, uncomplicated: Secondary | ICD-10-CM | POA: Insufficient documentation

## 2011-12-07 DIAGNOSIS — M79609 Pain in unspecified limb: Secondary | ICD-10-CM | POA: Insufficient documentation

## 2011-12-07 MED ORDER — OXYCODONE-ACETAMINOPHEN 5-325 MG PO TABS: 1.0000 | ORAL_TABLET | ORAL | Status: AC | PRN

## 2011-12-07 MED ORDER — OXYCODONE-ACETAMINOPHEN 5-325 MG PO TABS
1.0000 | ORAL_TABLET | Freq: Once | ORAL | Status: AC
  Administered 2011-12-07: 1 via ORAL
  Filled 2011-12-07: qty 1

## 2011-12-07 NOTE — Discharge Instructions (Signed)
Please call Dr. Lindalou Hose office tomorrow to obtain an appointment this week for reevaluation.  Cervical Radiculopathy Cervical radiculopathy happens when a nerve in the neck is pinched or bruised by a slipped (herniated) disk or by arthritic changes in the bones of the cervical spine. This can occur due to an injury or as part of the normal aging process. Pressure on the cervical nerves can cause pain or numbness that runs from your neck all the way down into your arm and fingers. CAUSES  There are many possible causes, including:  Injury.   Muscle tightness in the neck from overuse.   Swollen, painful joints (arthritis).   Breakdown or degeneration in the bones and joints of the spine (spondylosis) due to aging.   Bone spurs that may develop near the cervical nerves.  SYMPTOMS  Symptoms include pain, weakness, or numbness in the affected arm and hand. Pain can be severe or irritating. Symptoms may be worse when extending or turning the neck. DIAGNOSIS  Your caregiver will ask about your symptoms and do a physical exam. He or she may test your strength and reflexes. X-rays, CT scans, and MRI scans may be needed in cases of injury or if the symptoms do not go away after a period of time. Electromyography (EMG) or nerve conduction testing may be done to study how your nerves and muscles are working. TREATMENT  Your caregiver may recommend certain exercises to help relieve your symptoms. Cervical radiculopathy can, and often does, get better with time and treatment. If your problems continue, treatment options may include:  Wearing a soft collar for short periods of time.   Physical therapy to strengthen the neck muscles.   Medicines, such as nonsteroidal anti-inflammatory drugs (NSAIDs), oral corticosteroids, or spinal injections.   Surgery. Different types of surgery may be done depending on the cause of your problems.  HOME CARE INSTRUCTIONS   Put ice on the affected area.   Put ice  in a plastic bag.   Place a towel between your skin and the bag.   Leave the ice on for 15 to 20 minutes, 3 to 4 times a day or as directed by your caregiver.   Use a flat pillow when you sleep.   Only take over-the-counter or prescription medicines for pain, discomfort, or fever as directed by your caregiver.   If physical therapy was prescribed, follow your caregiver's directions.   If a soft collar was prescribed, use it as directed.  SEEK IMMEDIATE MEDICAL CARE IF:   Your pain gets much worse and cannot be controlled with medicines.   You have weakness or numbness in your hand, arm, face, or leg.   You have a high fever or a stiff, rigid neck.   You lose bowel or bladder control (incontinence).   You have trouble with walking, balance, or speaking.  MAKE SURE YOU:   Understand these instructions.   Will watch your condition.   Will get help right away if you are not doing well or get worse.  Document Released: 04/15/2001 Document Revised: 07/10/2011 Document Reviewed: 03/04/2011 [facility] Patient Information 2012 [place], [place].

## 2011-12-07 NOTE — ED Provider Notes (Signed)
History  Scribed for Nat Christen, MD, the patient was seen in room STRE4/STRE4. This chart was scribed by Candelaria Stagers. The patient's care started at 1:29 PM    CSN: [id number]  Arrival date & time 12/07/11  1247   First MD Initiated Contact with Patient 12/07/11 1327      Chief Complaint  Patient presents with  . Incisional Pain     HPI Timothy Peck is a 51 y.o. male who presents to the Emergency Department complaining of incisional pain and swelling after having surgery for herniation of cervical intervertebral disc.  Pt states that it hurts to swallow.  He states that after the surgery his arm felt fine and for the past three days he has had pain down his left arm.  Pain is worse when lying down.  Pt states he has tried to f/u with surgeon and has not been able to schedule an appointment.    Past Medical History  Diagnosis Date  . Depression   . Degenerative disc disease, cervical     Dx years ago    Past Surgical History  Procedure Date  . Tumor removal     left foot third toe  . Anterior cervical decomp/discectomy fusion 11/28/2011    Procedure: ANTERIOR CERVICAL DECOMPRESSION/DISCECTOMY FUSION 1 LEVEL;  Surgeon: Temple Pacini, MD;  Location: [facility];  Service: Neurosurgery;  Laterality: N/A;  Anterior Cervical Six-Seven Decompression and Fusion    Family History  Problem Relation Age of Onset  . Anesthesia problems Neg Hx   . Hypotension Neg Hx   . Malignant hyperthermia Neg Hx   . Pseudochol deficiency Neg Hx     History  Substance Use Topics  . Smoking status: Current Everyday Smoker -- 0.5 packs/day for 9 years    Types: Cigarettes  . Smokeless tobacco: Never Used  . Alcohol Use: Yes     occasional      Review of Systems  HENT: Positive for trouble swallowing.   Musculoskeletal: Positive for arthralgias (left arm).  Skin:       Incisional pain and swelling.   All other systems reviewed and are negative.    Allergies  Review of  patient's allergies indicates no known allergies.  Home Medications   Current Outpatient Rx  Name Route Sig Dispense Refill  . VITAMIN C 1000 MG PO TABS Oral Take 1,000 mg by mouth daily.    Marland Kitchen DIAZEPAM 5 MG PO TABS Oral Take 5 mg by mouth every 6 (six) hours as needed. For muscle spasms.    . IBUPROFEN 200 MG PO TABS Oral Take 800 mg by mouth every 6 (six) hours as needed. For pain    . OXYCODONE-ACETAMINOPHEN 5-325 MG PO TABS Oral Take 1 tablet by mouth every 4 (four) hours as needed. For pain    . PAROXETINE HCL 10 MG PO TABS Oral Take 10 mg by mouth every morning.    Marland Kitchen PREDNISONE 10 MG PO TABS Oral Take 20 mg by mouth 2 (two) times daily.    . QUETIAPINE FUMARATE ER 300 MG PO TB24 Oral Take 300 mg by mouth at bedtime.      BP 148/94  Pulse 58  Temp(Src) 97.8 F (36.6 C) (Oral)  Resp 18  SpO2 100%  Physical Exam  Nursing note and vitals reviewed. Constitutional: He is oriented to person, place, and time. He appears well-developed and well-nourished. No distress.  HENT:  Head: Normocephalic and atraumatic.  Eyes: Conjunctivae and EOM  are normal. Pupils are equal, round, and reactive to light. Right eye exhibits no discharge. Left eye exhibits no discharge.  Neck: Normal range of motion. Neck supple. No tracheal deviation present.       Wound and anterior right neck has Steri-Strips in place.  No drainage.  No erythema.  Mild swelling underneath the incision without erythema or warmth  Cardiovascular: Normal rate.   Pulmonary/Chest: Effort normal.  Abdominal: Soft.  Musculoskeletal: He exhibits tenderness. He exhibits no edema.       Tenderness both midline and over the paraspinal muscles.  Pain with raising the left arm even to shoulder level.    Palpable radial pulses bilaterally.  Slight decrease in grip strength on left.    Lymphadenopathy:    He has no cervical adenopathy.  Neurological: He is alert and oriented to person, place, and time.  Skin: Skin is warm and dry. He  is not diaphoretic.  Psychiatric: He has a normal mood and affect. His behavior is normal. Judgment and thought content normal.    ED Course  Procedures  DIAGNOSTIC STUDIES: Oxygen Saturation is 100% on room air, normal by my interpretation.    COORDINATION OF CARE:   Labs Reviewed - No data to display No results found.   No diagnosis found.    MDM  Patient with a wound that appears clean and intact.  There is no signs of drainage or erythema.  Patient is afebrile here.  Patient has some return of left arm pain at this time.  He has mild weakness on that side at this time.  I will discharge the patient with Percocet for pain and have instructed him to call the office tomorrow to obtain a followup appointment this week with Dr. Dutch Quint.  I have no ability to specifically obtain an appointment for the patient today since it is Sunday.  Patient does have the physician's business card so has the number in his possession.  I personally performed the services described in this documentation, which was scribed in my presence. The recorded information has been reviewed and considered.   Nat Christen, MD 12/07/11 1346

## 2011-12-07 NOTE — ED Notes (Signed)
Pt here for swelling to incision site on neck from cervical sx; steri strips present are clean and dry; pt denies drainage; pt sts incision got hit accidently

## 2012-01-31 ENCOUNTER — Encounter (HOSPITAL_COMMUNITY)
Admission: EM | Disposition: A | Discharge status: Home / Self Care | Payer: Self-pay | Source: Ambulatory Visit | Attending: Internal Medicine

## 2012-01-31 ENCOUNTER — Inpatient Hospital Stay (HOSPITAL_COMMUNITY)
Admission: EM | Admit: 2012-01-31 | Discharge: 2012-02-03 | DRG: 247 | Disposition: A | Discharge status: Home / Self Care | Payer: Medicaid Other | Source: Ambulatory Visit | Attending: Cardiology | Admitting: Cardiology

## 2012-01-31 ENCOUNTER — Inpatient Hospital Stay (HOSPITAL_COMMUNITY): Payer: Medicaid Other

## 2012-01-31 ENCOUNTER — Emergency Department (HOSPITAL_COMMUNITY): Payer: Medicaid Other

## 2012-01-31 ENCOUNTER — Encounter (HOSPITAL_COMMUNITY): Payer: Self-pay | Admitting: Internal Medicine

## 2012-01-31 DIAGNOSIS — I214 Non-ST elevation (NSTEMI) myocardial infarction: Secondary | ICD-10-CM

## 2012-01-31 DIAGNOSIS — F17200 Nicotine dependence, unspecified, uncomplicated: Secondary | ICD-10-CM | POA: Diagnosis present

## 2012-01-31 DIAGNOSIS — F329 Major depressive disorder, single episode, unspecified: Secondary | ICD-10-CM | POA: Diagnosis present

## 2012-01-31 DIAGNOSIS — M5030 Other cervical disc degeneration, unspecified cervical region: Secondary | ICD-10-CM | POA: Diagnosis present

## 2012-01-31 DIAGNOSIS — R079 Chest pain, unspecified: Secondary | ICD-10-CM | POA: Diagnosis present

## 2012-01-31 DIAGNOSIS — Z720 Tobacco use: Secondary | ICD-10-CM | POA: Diagnosis present

## 2012-01-31 DIAGNOSIS — I498 Other specified cardiac arrhythmias: Secondary | ICD-10-CM | POA: Diagnosis present

## 2012-01-31 DIAGNOSIS — F1910 Other psychoactive substance abuse, uncomplicated: Secondary | ICD-10-CM

## 2012-01-31 DIAGNOSIS — I2510 Atherosclerotic heart disease of native coronary artery without angina pectoris: Secondary | ICD-10-CM | POA: Diagnosis present

## 2012-01-31 DIAGNOSIS — Z955 Presence of coronary angioplasty implant and graft: Secondary | ICD-10-CM

## 2012-01-31 DIAGNOSIS — F1010 Alcohol abuse, uncomplicated: Secondary | ICD-10-CM

## 2012-01-31 DIAGNOSIS — Z79899 Other long term (current) drug therapy: Secondary | ICD-10-CM

## 2012-01-31 DIAGNOSIS — F3289 Other specified depressive episodes: Secondary | ICD-10-CM | POA: Diagnosis present

## 2012-01-31 DIAGNOSIS — F1410 Cocaine abuse, uncomplicated: Secondary | ICD-10-CM | POA: Diagnosis present

## 2012-01-31 DIAGNOSIS — R001 Bradycardia, unspecified: Secondary | ICD-10-CM

## 2012-01-31 HISTORY — PX: LEFT HEART CATHETERIZATION WITH CORONARY ANGIOGRAM: SHX5451

## 2012-01-31 HISTORY — DX: Non-ST elevation (NSTEMI) myocardial infarction: I21.4

## 2012-01-31 LAB — CARDIAC PANEL(CRET KIN+CKTOT+MB+TROPI)
CK, MB: 86.7 ng/mL (ref 0.3–4.0)
CK, MB: 60.2 ng/mL (ref 0.3–4.0)
Total CK: 1117 U/L — ABNORMAL HIGH (ref 7–232)
Total CK: 841 U/L — ABNORMAL HIGH (ref 7–232)
Troponin I: >20 ng/mL (ref <0.30)

## 2012-01-31 LAB — RAPID URINE DRUG SCREEN, HOSP PERFORMED
Amphetamines: NOT DETECTED
Barbiturates: NOT DETECTED
Cocaine: POSITIVE — AB
Opiates: NOT DETECTED
Tetrahydrocannabinol: POSITIVE — AB

## 2012-01-31 LAB — COMPREHENSIVE METABOLIC PANEL
ALT: 23 U/L (ref 0–53)
Albumin: 2.9 g/dL — ABNORMAL LOW (ref 3.5–5.2)
Alkaline Phosphatase: 39 U/L (ref 39–117)
Potassium: 3.3 mEq/L — ABNORMAL LOW (ref 3.5–5.1)
Sodium: 145 mEq/L (ref 135–145)
Total Protein: 5 g/dL — ABNORMAL LOW (ref 6.0–8.3)

## 2012-01-31 LAB — POCT I-STAT TROPONIN I

## 2012-01-31 LAB — LIPASE, BLOOD: Lipase: 21 U/L (ref 11–59)

## 2012-01-31 LAB — CBC WITH DIFFERENTIAL/PLATELET
Basophils Absolute: 0 10*3/uL (ref 0.0–0.1)
Basophils Relative: 0 % (ref 0–1)
Eosinophils Absolute: 0.4 10*3/uL (ref 0.0–0.7)
MCH: 32 pg (ref 26.0–34.0)
MCHC: 34.8 g/dL (ref 30.0–36.0)
Neutrophils Relative %: 68 % (ref 43–77)
Platelets: 214 10*3/uL (ref 150–400)

## 2012-01-31 SURGERY — LEFT HEART CATHETERIZATION WITH CORONARY ANGIOGRAM
Anesthesia: LOCAL

## 2012-01-31 MED ORDER — VITAMIN C 500 MG PO TABS
1000.0000 mg | ORAL_TABLET | Freq: Every day | ORAL | Status: DC
  Administered 2012-01-31 – 2012-02-03 (×4): 1000 mg via ORAL
  Filled 2012-01-31 (×4): qty 2

## 2012-01-31 MED ORDER — MORPHINE SULFATE 2 MG/ML IJ SOLN: 1.0000 mg | INTRAMUSCULAR | Status: DC | PRN

## 2012-01-31 MED ORDER — ACETAMINOPHEN 325 MG PO TABS
650.0000 mg | ORAL_TABLET | ORAL | Status: DC | PRN
  Administered 2012-01-31: 650 mg via ORAL
  Filled 2012-01-31: qty 2

## 2012-01-31 MED ORDER — ASPIRIN EC 81 MG PO TBEC: 81.0000 mg | DELAYED_RELEASE_TABLET | Freq: Every day | ORAL | Status: DC

## 2012-01-31 MED ORDER — NITROGLYCERIN 2 % TD OINT
1.0000 [in_us] | TOPICAL_OINTMENT | Freq: Once | TRANSDERMAL | Status: AC
  Administered 2012-01-31: 1 [in_us] via TOPICAL
  Filled 2012-01-31: qty 1

## 2012-01-31 MED ORDER — LIDOCAINE HCL (PF) 1 % IJ SOLN
INTRAMUSCULAR | Status: AC
  Filled 2012-01-31: qty 30

## 2012-01-31 MED ORDER — FENTANYL CITRATE 0.05 MG/ML IJ SOLN
INTRAMUSCULAR | Status: AC
  Filled 2012-01-31: qty 2

## 2012-01-31 MED ORDER — HEPARIN BOLUS VIA INFUSION
4000.0000 [IU] | Freq: Once | INTRAVENOUS | Status: AC
  Administered 2012-01-31: 4000 [IU] via INTRAVENOUS

## 2012-01-31 MED ORDER — NITROGLYCERIN IN D5W 200-5 MCG/ML-% IV SOLN
INTRAVENOUS | Status: AC
  Filled 2012-01-31: qty 250

## 2012-01-31 MED ORDER — NITROGLYCERIN 0.4 MG SL SUBL: 0.4000 mg | SUBLINGUAL_TABLET | SUBLINGUAL | Status: DC | PRN

## 2012-01-31 MED ORDER — SODIUM CHLORIDE 0.9 % IV BOLUS (SEPSIS)
1000.0000 mL | Freq: Once | INTRAVENOUS | Status: AC
  Administered 2012-01-31: 1000 mL via INTRAVENOUS

## 2012-01-31 MED ORDER — EPTIFIBATIDE BOLUS VIA INFUSION
180.0000 ug/kg | Freq: Once | INTRAVENOUS | Status: DC
  Filled 2012-01-31: qty 20

## 2012-01-31 MED ORDER — THIAMINE HCL 100 MG/ML IJ SOLN
100.0000 mg | Freq: Every day | INTRAMUSCULAR | Status: DC
  Filled 2012-01-31 (×3): qty 1

## 2012-01-31 MED ORDER — MORPHINE SULFATE 2 MG/ML IJ SOLN: 2.0000 mg | INTRAMUSCULAR | Status: DC | PRN

## 2012-01-31 MED ORDER — ASPIRIN 81 MG PO CHEW
CHEWABLE_TABLET | ORAL | Status: AC
  Administered 2012-01-31: 324 mg
  Filled 2012-01-31: qty 4

## 2012-01-31 MED ORDER — LORAZEPAM 2 MG/ML IJ SOLN: 1.0000 mg | Freq: Four times a day (QID) | INTRAMUSCULAR | Status: AC | PRN

## 2012-01-31 MED ORDER — KETOROLAC TROMETHAMINE 30 MG/ML IJ SOLN
INTRAMUSCULAR | Status: AC
  Filled 2012-01-31: qty 1

## 2012-01-31 MED ORDER — SODIUM CHLORIDE 0.9 % IJ SOLN: 3.0000 mL | Freq: Two times a day (BID) | INTRAMUSCULAR | Status: DC

## 2012-01-31 MED ORDER — CLOPIDOGREL BISULFATE 300 MG PO TABS
ORAL_TABLET | ORAL | Status: AC
  Filled 2012-01-31: qty 1

## 2012-01-31 MED ORDER — LORAZEPAM 0.5 MG PO TABS
1.0000 mg | ORAL_TABLET | Freq: Four times a day (QID) | ORAL | Status: AC | PRN
  Administered 2012-01-31: 1 mg via ORAL
  Filled 2012-01-31 (×2): qty 1

## 2012-01-31 MED ORDER — EPTIFIBATIDE 75 MG/100ML IV SOLN
2.0000 ug/kg/min | INTRAVENOUS | Status: DC
  Administered 2012-01-31 – 2012-02-02 (×6): 2 ug/kg/min via INTRAVENOUS
  Filled 2012-01-31 (×15): qty 100

## 2012-01-31 MED ORDER — MIDAZOLAM HCL 2 MG/2ML IJ SOLN
INTRAMUSCULAR | Status: AC
  Filled 2012-01-31: qty 2

## 2012-01-31 MED ORDER — ASPIRIN EC 325 MG PO TBEC
325.0000 mg | DELAYED_RELEASE_TABLET | Freq: Every day | ORAL | Status: DC
  Filled 2012-01-31: qty 1

## 2012-01-31 MED ORDER — NITROGLYCERIN 0.2 MG/ML ON CALL CATH LAB
INTRAVENOUS | Status: AC
  Filled 2012-01-31: qty 1

## 2012-01-31 MED ORDER — ASPIRIN 81 MG PO CHEW: 324.0000 mg | CHEWABLE_TABLET | Freq: Once | ORAL | Status: AC

## 2012-01-31 MED ORDER — CLOPIDOGREL BISULFATE 75 MG PO TABS
75.0000 mg | ORAL_TABLET | Freq: Every day | ORAL | Status: DC
  Administered 2012-02-01 – 2012-02-02 (×2): 75 mg via ORAL
  Filled 2012-01-31 (×4): qty 1

## 2012-01-31 MED ORDER — ONDANSETRON HCL 4 MG/2ML IJ SOLN
INTRAMUSCULAR | Status: AC
  Filled 2012-01-31: qty 2

## 2012-01-31 MED ORDER — KETOROLAC TROMETHAMINE 30 MG/ML IJ SOLN
30.0000 mg | Freq: Once | INTRAMUSCULAR | Status: AC
  Administered 2012-01-31: 30 mg via INTRAVENOUS

## 2012-01-31 MED ORDER — VITAMIN B-1 100 MG PO TABS
100.0000 mg | ORAL_TABLET | Freq: Every day | ORAL | Status: DC
  Administered 2012-01-31 – 2012-02-02 (×3): 100 mg via ORAL
  Filled 2012-01-31 (×3): qty 1

## 2012-01-31 MED ORDER — ONDANSETRON HCL 4 MG/2ML IJ SOLN
4.0000 mg | Freq: Four times a day (QID) | INTRAMUSCULAR | Status: DC | PRN
  Administered 2012-01-31: 4 mg via INTRAVENOUS
  Filled 2012-01-31: qty 2

## 2012-01-31 MED ORDER — ADULT MULTIVITAMIN W/MINERALS CH
1.0000 | ORAL_TABLET | Freq: Every day | ORAL | Status: DC
  Administered 2012-01-31 – 2012-02-02 (×3): 1 via ORAL
  Filled 2012-01-31 (×3): qty 1

## 2012-01-31 MED ORDER — ONDANSETRON HCL 4 MG/2ML IJ SOLN: 4.0000 mg | Freq: Four times a day (QID) | INTRAMUSCULAR | Status: DC | PRN

## 2012-01-31 MED ORDER — QUETIAPINE FUMARATE ER 300 MG PO TB24
300.0000 mg | ORAL_TABLET | Freq: Every day | ORAL | Status: DC
  Administered 2012-01-31 – 2012-02-02 (×3): 300 mg via ORAL
  Filled 2012-01-31 (×6): qty 1

## 2012-01-31 MED ORDER — NICOTINE 21 MG/24HR TD PT24: 21.0000 mg | MEDICATED_PATCH | Freq: Once | TRANSDERMAL | Status: DC

## 2012-01-31 MED ORDER — HEPARIN (PORCINE) IN NACL 100-0.45 UNIT/ML-% IJ SOLN
1000.0000 [IU]/h | INTRAMUSCULAR | Status: DC
  Administered 2012-01-31: 1000 [IU]/h via INTRAVENOUS
  Filled 2012-01-31 (×2): qty 250

## 2012-01-31 MED ORDER — SODIUM CHLORIDE 0.9 % IV SOLN: INTRAVENOUS | Status: DC

## 2012-01-31 MED ORDER — SODIUM CHLORIDE 0.9 % IV SOLN
INTRAVENOUS | Status: AC
  Administered 2012-01-31: 16:00:00 via INTRAVENOUS

## 2012-01-31 MED ORDER — POTASSIUM CHLORIDE CRYS ER 20 MEQ PO TBCR
40.0000 meq | EXTENDED_RELEASE_TABLET | Freq: Once | ORAL | Status: AC
  Administered 2012-01-31: 40 meq via ORAL
  Filled 2012-01-31: qty 2

## 2012-01-31 MED ORDER — EPTIFIBATIDE 75 MG/100ML IV SOLN
INTRAVENOUS | Status: AC
  Administered 2012-01-31: 2 ug/kg/min via INTRAVENOUS
  Filled 2012-01-31: qty 100

## 2012-01-31 MED ORDER — HYDRALAZINE HCL 20 MG/ML IJ SOLN
5.0000 mg | Freq: Four times a day (QID) | INTRAMUSCULAR | Status: DC | PRN
  Filled 2012-01-31: qty 0.25

## 2012-01-31 MED ORDER — PAROXETINE HCL 10 MG PO TABS
10.0000 mg | ORAL_TABLET | Freq: Every day | ORAL | Status: DC
  Administered 2012-01-31 – 2012-02-03 (×4): 10 mg via ORAL
  Filled 2012-01-31 (×4): qty 1

## 2012-01-31 MED ORDER — NITROGLYCERIN IN D5W 200-5 MCG/ML-% IV SOLN
2.0000 ug/min | INTRAVENOUS | Status: DC
  Administered 2012-01-31 – 2012-02-01 (×2): 10 ug/min via INTRAVENOUS
  Filled 2012-01-31 (×2): qty 250

## 2012-01-31 MED ORDER — FOLIC ACID 1 MG PO TABS
1.0000 mg | ORAL_TABLET | Freq: Every day | ORAL | Status: DC
  Administered 2012-01-31 – 2012-02-03 (×4): 1 mg via ORAL
  Filled 2012-01-31 (×4): qty 1

## 2012-01-31 MED ORDER — HEPARIN (PORCINE) IN NACL 2-0.9 UNIT/ML-% IJ SOLN
INTRAMUSCULAR | Status: AC
  Filled 2012-01-31: qty 2000

## 2012-01-31 NOTE — ED Notes (Signed)
Per EMS, pt having CP started at 6pm after an argument; gradually gotten worse, L sided, no radiation, denies SOB, n/v/diaphoresis; worse with movement and respirations; 324 mg asa given 2 nitro given, 9/10 pain after meds; 20g Lhand; vss, 134/87, 100% on RA, HR 75; RR 20

## 2012-01-31 NOTE — Progress Notes (Signed)
[facility] [place] TEAM 1 - Stepdown/ICU TEAM  PCP:  Pcp Not In System  Subjective: 51 yo thin male with a history of depression, alcohol abuse, drug abuse, tobacco abuse since age of 37, and recent cervical spine surgery. He drinks 3-4 or more 40 ounce beers per day.  He presented with complaints of L sided pressure like chest pain over the last 3 days with no particular relieving or provoking factors.  Since admit, the patient has develped recurrent chest pain.  His cardiac enzymes have climbed.  He is therefore being taken to the cardiac cath lab per [facility].  I was able to examine him prior to the procedure.  He was alert and conversant, but admittedly nervous.  He denied cp, sob, ha, abdom pain, or n/v at that time.   Objective:  Intake/Output Summary (Last 24 hours) at 01/31/12 1453 Last data filed at 01/31/12 1253  Gross per 24 hour  Intake 651.92 ml  Output   1575 ml  Net -923.08 ml   Blood pressure 103/63, pulse 59, temperature 98.3 F (36.8 C), temperature source Oral, resp. rate 20, height 6\' 4"  (1.93 m), weight 80 kg (176 lb 5.9 oz), SpO2 100.00%.  Physical Exam: General: No acute respiratory distress Lungs: Clear to auscultation bilaterally without wheezes or crackles Cardiovascular: Regular rate and rhythm without murmur gallop or rub normal S1 and S2 Extremities: No significant cyanosis, clubbing, or edema bilateral lower extremities  Lab Results:  [facility] 01/31/12 0254  NA 145  K 3.3*  CL 109  CO2 24  GLUCOSE 71  BUN 12  CREATININE 1.27  CALCIUM 8.5  MG --  PHOS --    Basename 01/31/12 0254  AST 41*  ALT 23  ALKPHOS 39  BILITOT 0.3  PROT 5.0*  ALBUMIN 2.9*    Basename 01/31/12 0254  WBC 14.6*  NEUTROABS 9.9*  HGB 14.0  HCT 40.2  MCV 92.0  PLT 214    Basename 01/31/12 1200  CKTOTAL 1117*  CKMB 86.7*  CKMBINDEX --  TROPONINI >20.00*   Micro Results: Recent Results (from the past 240 hour(s))  MRSA PCR SCREENING     Status:  Normal   Collection Time   01/31/12  6:20 AM      Component Value Range Status Comment   MRSA by PCR NEGATIVE  NEGATIVE Final     Studies/Results: All recent x-ray/radiology reports have been reviewed in detail.   Medications: I have reviewed the patient's complete medication list.  Assessment/Plan:  NSTEMI / elevated cardiac enzymes In setting of admitted cocaine abuse - to cath lab - care per [facility]  Polysubstance abuse To include heavy EtoH abuse, cocaine use, marijuana use, and tobacco abuse - agree with empiric CIWA protocol - educate on need for absolute abstinence   hypokalemia likley due to poor nutrition - replacing - follow trend   ANTERIOR CERVICAL DECOMPRESSION/DISCECTOMY FUSION Surgeon: Temple Pacini, MD - 11/28/2011 - no immediate evidence of complications  Lonia Blood, MD [facility] Office  [phone] Pager [phone]  On-Call/Text Page:      [email or web address]      password [facility]

## 2012-01-31 NOTE — Progress Notes (Signed)
ANTICOAGULATION CONSULT NOTE - Initial Consult  Pharmacy Consult for heparin Indication: chest pain/ACS  No Known Allergies  Patient Measurements: Height: 6\' 4"  (193 cm) Weight: 176 lb 5.9 oz (80 kg) IBW/kg (Calculated) : 86.8  Heparin Dosing Weight: 80 kg  Vital Signs: Temp: 98.3 F (36.8 C) (06/29 1150) Temp src: Oral (06/29 1150) BP: 130/86 mmHg (06/29 1630) Pulse Rate: 51  (06/29 1630)  Labs:  Basename 01/31/12 1200 01/31/12 1159 01/31/12 0254  HGB -- -- 14.0  HCT -- -- 40.2  PLT -- -- 214  APTT -- -- --  LABPROT -- -- --  INR -- -- --  HEPARINUNFRC -- 0.49 --  CREATININE -- -- 1.27  CKTOTAL 1117* -- --  CKMB 86.7* -- --  TROPONINI >20.00* -- --    Estimated Creatinine Clearance: 79.6 ml/min (by C-G formula based on Cr of 1.27).   Medical History: Past Medical History  Diagnosis Date  . Depression   . Degenerative disc disease, cervical     Dx years ago    Medications:  Prescriptions prior to admission  Medication Sig Dispense Refill  . Ascorbic Acid (VITAMIN C) 1000 MG tablet Take 1,000 mg by mouth daily.      Marland Kitchen ibuprofen (ADVIL,MOTRIN) 200 MG tablet Take 800 mg by mouth every 6 (six) hours as needed. For pain      . PARoxetine (PAXIL) 10 MG tablet Take 10 mg by mouth every morning.      Marland Kitchen QUEtiapine (SEROQUEL XR) 300 MG 24 hr tablet Take 300 mg by mouth at bedtime.        Assessment: Patient is a 51 y.o. M admitted to the ED today with CP and positive cardiac enzymes. Heparin 4000 units IV bolus and drip at 1000 units/hr started at ~0600 this morning per MD in the ED. Patient taken to cath today d/t climbing troponin.  Found to have large prox LAD thrombus with plan to resume heparin and start IIb/IIIa post cath with relook on Monday. Heparin level today = 0.49 at 1000units/hr. Dose of integrelin 84mcg/kg/min OK for renal function.   Goal of Therapy:  Heparin level 0.3-0.5 for concomitant IIb/IIa Monitor platelets by anticoagulation protocol: Yes     Plan:  1.) Heparin to start 4h after sheath removal (15:25),  Heparin no bolus at 1000units/hr 2.) Heparin level and  CBC at 0200. 3.) heparin level and CBC daily 4.) monitor for bleeding   Dannielle Huh 01/31/2012,4:38 PM

## 2012-01-31 NOTE — Progress Notes (Addendum)
ANTICOAGULATION CONSULT NOTE - Initial Consult  Pharmacy Consult for heparin Indication: chest pain/ACS  No Known Allergies  Patient Measurements: Height: 6\' 4"  (193 cm) Weight: 176 lb 5.9 oz (80 kg) IBW/kg (Calculated) : 86.8  Heparin Dosing Weight: 80 kg  Vital Signs: Temp: 98.8 F (37.1 C) (06/29 0733) Temp src: Oral (06/29 0733) BP: 118/69 mmHg (06/29 1000) Pulse Rate: 60  (06/29 1000)  Labs:  Basename 01/31/12 0254  HGB 14.0  HCT 40.2  PLT 214  APTT --  LABPROT --  INR --  HEPARINUNFRC --  CREATININE 1.27  CKTOTAL --  CKMB --  TROPONINI --    Estimated Creatinine Clearance: 79.6 ml/min (by C-G formula based on Cr of 1.27).   Medical History: Past Medical History  Diagnosis Date  . Depression   . Degenerative disc disease, cervical     Dx years ago    Medications:  Prescriptions prior to admission  Medication Sig Dispense Refill  . Ascorbic Acid (VITAMIN C) 1000 MG tablet Take 1,000 mg by mouth daily.      Marland Kitchen ibuprofen (ADVIL,MOTRIN) 200 MG tablet Take 800 mg by mouth every 6 (six) hours as needed. For pain      . PARoxetine (PAXIL) 10 MG tablet Take 10 mg by mouth every morning.      Marland Kitchen QUEtiapine (SEROQUEL XR) 300 MG 24 hr tablet Take 300 mg by mouth at bedtime.        Assessment: Patient is a 51 y.o. M admitted to the ED today with CP and positive cardiac enzymes. Heparin 4000 units IV bolus and drip at 1000 units/hr started at ~0600 this morning per MD in the ED. Plan for cardiac cath on Monday.  Goal of Therapy:  Heparin level 0.3-0.7 units/ml Monitor platelets by anticoagulation protocol: Yes   Plan:  1) continue current heparin rate 2) check level at noon   Adden:  Heparin level 6 hours after drip started now back therapeutic at 0.49.  Continue heparin at 1000 units/hr.  Azalie Harbeck P 01/31/2012,11:38 AM

## 2012-01-31 NOTE — ED Notes (Signed)
Admitting MD at bedside.

## 2012-01-31 NOTE — Op Note (Signed)
ZAKEE DEERMAN is a 51 y.o. male    [id number] LOCATION:  FACILITY: [facility]  PHYSICIAN: Nanetta Batty, M.D. 1961/01/25   DATE OF PROCEDURE:  01/31/2012  DATE OF DISCHARGE:  [facility]  CARDIAC CATHETERIZATION     History obtained from chart review. Mr. Menning is a 51 year old single after the rectum male positive factors including 80+-pack-year history of tobacco abuse and family history. He also routinely uses THC, alcohol and cocaine. He presented to the hospital with chest pain nausea vomiting. His enzymes were positive and continued to rise. His EKG showed no acute changes. Because of this he was brought to the cardiac catheterization laboratory to define his anatomy  PROCEDURE DESCRIPTION:     The patient was brought to the second floor  [facility]iac cath lab in the postabsorptive state. He Was  premedicated with IV Versed and fentanyl.Marland Kitchen His right groin was prepped and shaved in usual sterile fashion. Xylocaine 1% was used  for local anesthesia. A 6 French sheath was inserted into the the common femoral  artery using standard Seldinger technique. 6 French right and left Judkins catheters all the 6 French pigtail catheter were used for selective coronary angiography and left ventriculography respectively. Visipaque dye was used for the entirety of the case. Retrograde aorta, left ventricular pullback pressures were recorded.   HEMODYNAMICS:    AO SYSTOLIC/AO DIASTOLIC: 122/88   LV SYSTOLIC/LV DIASTOLIC: 125/14  ANGIOGRAPHIC RESULTS:   1. Left main; normal  2. LAD; ulcerative plaque of approximately 80 with a large superimposed thrombus burden 3. Left circumflex; nondominant and normal. There was a moderate size ramus branch that was normal as well..  4. Right coronary artery; dominant and normal 5. Left ventriculography; RAO left ventriculogram was performed using  25 mL of Visipaque dye at 12 mL/second. The overall LVEF estimated  50 %   Without wall motion abnormalities  IMPRESSION:Mr. 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 I believe the safest option is to treat him with Integrilin for 36-48 hours along with Plavix and aspirin. He'll need relook angiography on Monday to determine whether the thrombus will have resolved making percutaneous intervention safer. The sheath was removed and pressure was held on the groin to achieve hemostasis after a few as measured. The patient received 300 mg of by mouth Plavix in the cath lab Integrilin infusion was initiated. He left the lab in stable condition.  Runell Gess MD, [facility] 01/31/2012 3:35 PM

## 2012-01-31 NOTE — H&P (Signed)
    Pt was reexamined and existing H & P reviewed. No changes found.  Runell Gess, MD [facility] 01/31/2012 3:02 PM

## 2012-01-31 NOTE — Progress Notes (Signed)
CRITICAL VALUE ALERT  Critical value received:  Troponin >20, CKMB 86.7  Date of notification: 01/31/2012  Time of notification:  1250  Critical value read back:yes   Nurse who received alert:  Yvone Neu, RN  MD notified (1st page):  Huey Bienenstock, PA  Time of first page:  1255  MD notified (2nd page):  Time of second page:  Responding MD:  Huey Bienenstock, PA  Time MD responded:  [phone]

## 2012-01-31 NOTE — Progress Notes (Signed)
Chaplain Note:  Chaplain responded to referral from Cath Lab nurse.  Pt coming from CICU to Cath Lab.  Chaplain met pt and pt's nurses in CICU.  Pt was anxious about the risks associated with a cath procedure.  Pt has a fear of dying in a hospital.  Chaplain joined nurses in helping pt with through his anxious moment. Chaplain went to cath lab with pt and remained there in support of pt and staff throughout the procedure.  Chaplain made contact with pt's family and offered spiritual comfort and support. Pt, cath lab staff, and pt's family expressed appreciation for chaplain support.  Chaplain will follow up as needed.  01/31/12 1456  Clinical Encounter Type  Visited With Patient;Family;Health care provider  Visit Type Spiritual support  Referral From Nurse  Spiritual Encounters  Spiritual Needs Emotional  Stress Factors  Patient Stress Factors Loss of control;Lack of knowledge;Family relationships;Major life changes;Health changes  Family Stress Factors Family relationships;Major life changes;Lack of knowledge   Verdie Shire, chaplain resident [phone]

## 2012-01-31 NOTE — ED Notes (Signed)
Pt c/o onset left chest pain at 1800 hrs while drinking and arguing..  Denies radiation of pain,, NV diaphoresis.  States SOB "OFF and ON".  Rates pain 8/10 constant.  Has prior HX of CP in May 2013.

## 2012-01-31 NOTE — ED Provider Notes (Addendum)
History     CSN: [id number]  Arrival date & time 01/31/12  0103   First MD Initiated Contact with Patient 01/31/12 0229      Chief Complaint  Patient presents with  . Chest Pain    (Consider location/radiation/quality/duration/timing/severity/associated sxs/prior treatment) Patient is a 51 y.o. male presenting with chest pain. The history is provided by the patient and medical records. The history is limited by the condition of the patient.  Chest Pain Pertinent negatives for primary symptoms include no fever, no shortness of breath, no cough, no abdominal pain, no nausea and no vomiting.  Pertinent negatives for associated symptoms include no diaphoresis.    the patient is a 51 year old, male, who smokes cigarettes and drinks alcohol.  He presents emergency department complaining of chest pain.  For the past 3 days.  He, says it is worse.  He takes a deep breath.  The pain has been constant for the past 3 days.  It does not radiate.  He denies cough, shortness of breath, fevers, chills, nausea, vomiting, sweating, leg pain or swelling.  He has not had recent surgery or travel.  Level V caveat applies for severe intoxication.  Past Medical History  Diagnosis Date  . Depression   . Degenerative disc disease, cervical     Dx years ago    Past Surgical History  Procedure Date  . Tumor removal     left foot third toe  . Anterior cervical decomp/discectomy fusion 11/28/2011    Procedure: ANTERIOR CERVICAL DECOMPRESSION/DISCECTOMY FUSION 1 LEVEL;  Surgeon: Temple Pacini, MD;  Location: [facility];  Service: Neurosurgery;  Laterality: N/A;  Anterior Cervical Six-Seven Decompression and Fusion    Family History  Problem Relation Age of Onset  . Anesthesia problems Neg Hx   . Hypotension Neg Hx   . Malignant hyperthermia Neg Hx   . Pseudochol deficiency Neg Hx     History  Substance Use Topics  . Smoking status: Current Everyday Smoker -- 0.5 packs/day for 9 years    Types:  Cigarettes  . Smokeless tobacco: Never Used  . Alcohol Use: Yes     occasional      Review of Systems  Constitutional: Negative for fever, chills and diaphoresis.  HENT: Negative for congestion.   Respiratory: Negative for cough and shortness of breath.   Cardiovascular: Positive for chest pain. Negative for leg swelling.  Gastrointestinal: Negative for nausea, vomiting and abdominal pain.  Skin: Negative for rash.  Neurological: Negative for headaches.  Psychiatric/Behavioral: Negative for confusion.  All other systems reviewed and are negative.    Allergies  Review of patient's allergies indicates no known allergies.  Home Medications   Current Outpatient Rx  Name Route Sig Dispense Refill  . VITAMIN C 1000 MG PO TABS Oral Take 1,000 mg by mouth daily.    . IBUPROFEN 200 MG PO TABS Oral Take 800 mg by mouth every 6 (six) hours as needed. For pain    . PAROXETINE HCL 10 MG PO TABS Oral Take 10 mg by mouth every morning.    Marland Kitchen QUETIAPINE FUMARATE ER 300 MG PO TB24 Oral Take 300 mg by mouth at bedtime.      BP 147/89  Temp 97.8 F (36.6 C)  Resp 24  SpO2 100%  Physical Exam  Nursing note and vitals reviewed. Constitutional: He is oriented to person, place, and time. He appears well-developed and well-nourished. No distress.       Very strong odor of alcohol  HENT:  Head: Normocephalic and atraumatic.  Eyes: Conjunctivae are normal.  Neck: Normal range of motion. Neck supple.  Cardiovascular: Normal rate.   No murmur heard. Pulmonary/Chest: Effort normal and breath sounds normal. No respiratory distress. He has no rales.  Abdominal: Soft. He exhibits no distension. There is no tenderness.  Musculoskeletal: He exhibits no edema and no tenderness.  Neurological: He is alert and oriented to person, place, and time.  Skin: Skin is warm and dry.  Psychiatric: He has a normal mood and affect. Thought content normal.    ED Course  Procedures (including critical care  time)   Labs Reviewed  CBC WITH DIFFERENTIAL  COMPREHENSIVE METABOLIC PANEL  LIPASE, BLOOD   No results found.   No diagnosis found.  ECG 0109 Normal sinus rhythm with heart rate 70 Normal axis. Normal intervals. ST segment elevation suggestive of early repo versus pericarditis. No significant change compared to EKG from 11/17/2011  ECG at 0 350 Normal sinus rhythm with heart rate 88 beats per minute. Normal axis. Normal intervals. No significant change from prior EKG  4:42 AM Spoke with Dr. Herbie Baltimore. He will consult. He asked me to start ntg and heparin and page triad  Spoke with triad. He will admit.  Spoke with pt. Explained findings and need for admission.  He understands.  He is PAIN FREE.  MDM  Chest pain Non-stemi Alcohol abuse       Cheri Guppy, MD 01/31/12 1914  Cheri Guppy, MD 01/31/12 7829  Cheri Guppy, MD 01/31/12 [phone]

## 2012-01-31 NOTE — H&P (Signed)
[facility]                                                                                    Patient Demographics  Timothy Peck, is a 51 y.o. male  CSN: [id number]  MRN: [id number]  DOB - 04-Mar-1961  Admit Date - 01/31/2012  Outpatient Primary MD for the patient is Pcp Not In System   With History of -  Past Medical History  Diagnosis Date  . Depression   . Degenerative disc disease, cervical     Dx years ago      Past Surgical History  Procedure Date  . Tumor removal     left foot third toe  . Anterior cervical decomp/discectomy fusion 11/28/2011    Procedure: ANTERIOR CERVICAL DECOMPRESSION/DISCECTOMY FUSION 1 LEVEL;  Surgeon: Temple Pacini, MD;  Location: [facility];  Service: Neurosurgery;  Laterality: N/A;  Anterior Cervical Six-Seven Decompression and Fusion    in for   Chief Complaint  Patient presents with  . Chest Pain     HPI  Timothy Peck  is a 51 y.o. male, without significant past medical history presents with complaints of chest pain over the last 3 days no relieving or provoking factor, reports he has severe episode of chest pain pressure-like in left side of the chest nonradiating while he was argued with a neighbor, as well reports it was accompanied by nausea diaphoresis palpitations shortness of breath lightheadedness, patient reports he has been having these episodes on and off over the last 3 days, patient reports he has been using cocaine last time yesterday but he but he reports the chest pain is unrelated to cocaine as it wasn't present appeared to that and it did not come immediately after he smoked cocaine, as well reports he has history of heavy drinking on average to 40 ounce beer every day he had to drink up to 4 yesterday because of his friend birthday, patient did not have any significant EKG changes from previous one but did have elevated troponin 2.4  discussed with cardiology Dr. Herbie Baltimore from [facility] cardiology requested  admission to the hospitalist service and recommended starting patient on heparin drip, patient received 324 of aspirin and one sublingual nitroglycerin.    Review of Systems    In addition to the HPI above, scratch No Fever-chills, No Headache, No changes with Vision or hearing, No problems swallowing food or Liquids, Complaints of Chest pain, and Shortness of Breath, lightheadedness and palpitation No Abdominal pain,   Vommitting, Bowel movements are regular, complaints of nausea No Blood in stool or Urine, No dysuria, No new skin rashes or bruises, No new joints pains-aches,  No new weakness, tingling, numbness in any extremity, No recent weight gain or loss, No polyuria, polydypsia or polyphagia, History of alcohol and cocaine and marijuana use.  A full 10 point Review of Systems was done, except as stated above, all other Review of Systems were negative.   Social History History  Substance Use Topics  . Smoking status: Current Everyday Smoker -- 0.5 packs/day for 9 years    Types: Cigarettes  . Smokeless tobacco: Never Used  . Alcohol  Use: Yes     occasional    Family History Family History  Problem Relation Age of Onset  . Anesthesia problems Neg Hx   . Hypotension Neg Hx   . Malignant hyperthermia Neg Hx   . Pseudochol deficiency Neg Hx   . Coronary artery disease Father      Prior to Admission medications   Medication Sig Start Date End Date Taking? Authorizing Provider  Ascorbic Acid (VITAMIN C) 1000 MG tablet Take 1,000 mg by mouth daily.   Yes Historical Provider, MD  ibuprofen (ADVIL,MOTRIN) 200 MG tablet Take 800 mg by mouth every 6 (six) hours as needed. For pain   Yes Historical Provider, MD  PARoxetine (PAXIL) 10 MG tablet Take 10 mg by mouth every morning.   Yes Historical Provider, MD  QUEtiapine (SEROQUEL XR) 300 MG 24 hr tablet Take 300 mg by mouth at bedtime.   Yes Historical Provider, MD    No Known Allergies  Physical Exam  Vitals  Blood  pressure 120/66, pulse 91, temperature 97.8 F (36.6 C), resp. rate 18, SpO2 95.00%.   1. General well-nourished male lying in bed in NAD,    2. Normal affect and insight, Not Suicidal or Homicidal, Awake Alert, Oriented *3.  3. No F.N deficits, ALL C.Nerves Intact, Strength 5/5 all 4 extremities, Sensation intact all 4 extremities, Plantars down going.  4. Ears and Eyes appear Normal, Conjunctivae clear, PERRLA. Moist Oral Mucosa.  5. Supple Neck, No JVD, No cervical lymphadenopathy appriciated, No Carotid Bruits.  6. Symmetrical Chest wall movement, Good air movement bilaterally, CTAB.  7. RRR, No Gallops, Rubs or Murmurs, No Parasternal Heave.  8. Positive Bowel Sounds, Abdomen Soft, Non tender, No organomegaly appriciated,       No rebound -guarding or rigidity.  9.  No Cyanosis, Normal Skin Turgor, No Skin Rash or Bruise.  10. Good muscle tone,  joints appear normal , no effusions, Normal ROM.  11. No Palpable Lymph Nodes in Neck or Axillae    Data Review  CBC  Lab 01/31/12 0254  WBC 14.6*  HGB 14.0  HCT 40.2  PLT 214  MCV 92.0  MCH 32.0  MCHC 34.8  RDW 13.9  LYMPHSABS 3.1  MONOABS 1.1*  EOSABS 0.4  BASOSABS 0.0  BANDABS --   ------------------------------------------------------------------------------------------------------------------  Chemistries   Lab 01/31/12 0254  NA 145  K 3.3*  CL 109  CO2 24  GLUCOSE 71  BUN 12  CREATININE 1.27  CALCIUM 8.5  MG --  AST 41*  ALT 23  ALKPHOS 39  BILITOT 0.3   ------------------------------------------------------------------------------------------------------------------ CrCl is unknown because both a height and weight (above a minimum accepted value) are required for this calculation. ------------------------------------------------------------------------------------------------------------------ No results found for this basename: TSH,T4TOTAL,FREET3,T3FREE,THYROIDAB in the last 72  hours   Coagulation profile No results found for this basename: INR:5,PROTIME:5 in the last 168 hours ------------------------------------------------------------------------------------------------------------------- No results found for this basename: DDIMER:2 in the last 72 hours -------------------------------------------------------------------------------------------------------------------  Cardiac Enzymes No results found for this basename: CK:3,CKMB:3,TROPONINI:3,MYOGLOBIN:3 in the last 168 hours ------------------------------------------------------------------------------------------------------------------ No components found with this basename: POCBNP:3   ---------------------------------------------------------------------------------------------------------------  Urinalysis No results found for this basename: colorurine, appearanceur, labspec, phurine, glucoseu, hgbur, bilirubinur, ketonesur, proteinur, urobilinogen, nitrite, leukocytesur    ----------------------------------------------------------------------------------------------------------------   Imaging results:   Dg Chest Port 1 View  01/31/2012  *RADIOLOGY REPORT*  Clinical Data: Chest pain  PORTABLE CHEST - 1 VIEW  Comparison: 04/09/2011  Findings: Hypoaeration results in elevation of the hemidiaphragms, obscuring evaluation of  the lung bases.  Allowing for this, no focal consolidation, pleural effusion, or pneumothorax.  Mild interstitial prominence.  Cardiomediastinal contours within normal range.  No acute osseous finding. Partially imaged cervical fusion hardware.  IMPRESSION: Mild interstitial prominence.  No focal consolidation identified.  Original Report Authenticated By: Waneta Martins, M.D.    My personal review of EKG: Rhythm NSR, no acute ST or T wave changes once compared to previous  Personally reviewed Old Chart  Assessment & Plan  Principal Problem:  *NSTEMI (non-ST elevated  myocardial infarction) Active Problems:  Substance abuse  Alcohol abuse  Tobacco abuse    1. non-STEMI. Patient has elevated troponin, currently chest pain resolved, unclear if it's related to his cocaine use or not, patient will be given 324 of aspirin, started on heparin drip, will avoid beta blockers secondary to recent cocaine use, will have him sublingual nitroglycerin we'll check lipid panel, ED discussed with [facility] cardiology will evaluate in a.m. we'll admit to step down  2. Substance abuse. Patient has history of cocaine abuse last time he smoked yesterday possibly provoked his none STEMI, will avoid beta blockers, patient was counseled  3. Alcohol abuse. Patient was counseled will start on alcohol withdrawal protocol  4. Tobacco abuse. Patient was counseled will start on Nicoderm    AM Labs Ordered, also please review Full Orders  Admission, patients condition and plan of care including tests being ordered have been discussed with the patient who indicate understanding and agree with the plan and Code Status.  Code Status full  Condition GUARDED  Ceci Taliaferro M.D on 01/31/2012 at 5:24 AM  Between 7am to 7pm - Pager - [phone]  After 7pm go to [email or web address] - password [email or web address]  And look for the night coverage person covering me after hours  [facility] Office  [phone]

## 2012-01-31 NOTE — Consult Note (Signed)
Reason for Consult: NSTEMI  Referring Physician:   POPE Peck is an 51 y.o. male.  HPI:   The patient is a 51 yo thin AA male with a history of depression, alcohol abuse, drug abuse, Tobacco abuse since age of 77, recent cervical spine surgery.  He drinks 3-4 or more 40 ounce beers per day.  His family has a lot of birthdays in the month on June so he drinks more.  He presents with chest pain which began last night.  He describes the pain as "sore".  At its worst was 10/10 and during the interview was 3/10. He had associated diaphoresis.  He denies radiation of pain, nausea, vomiting, palpitations, cough, congestion, abdominal pain, LEE, dysuria, hematuria.    Past Medical History  Diagnosis Date  . Depression   . Degenerative disc disease, cervical     Dx years ago    Past Surgical History  Procedure Date  . Tumor removal     left foot third toe  . Anterior cervical decomp/discectomy fusion 11/28/2011    Procedure: ANTERIOR CERVICAL DECOMPRESSION/DISCECTOMY FUSION 1 LEVEL;  Surgeon: Temple Pacini, MD;  Location: [facility];  Service: Neurosurgery;  Laterality: N/A;  Anterior Cervical Six-Seven Decompression and Fusion    Family History  Problem Relation Age of Onset  . Anesthesia problems Neg Hx   . Hypotension Neg Hx   . Malignant hyperthermia Neg Hx   . Pseudochol deficiency Neg Hx   . Coronary artery disease Father     Social History:  reports that he has been smoking Cigarettes.  He has a 4.5 pack-year smoking history. He has never used smokeless tobacco. He reports that he drinks alcohol. He reports that he uses illicit drugs (Cocaine and Marijuana).  Allergies: No Known Allergies  Medications:     . aspirin  324 mg Oral Once  . aspirin EC  81 mg Oral Daily  . folic acid  1 mg Oral Daily  . heparin  4,000 Units Intravenous Once  . ketorolac  30 mg Intravenous Once  . ketorolac      . multivitamin with minerals  1 tablet Oral Daily  . nicotine  21 mg Transdermal  Once  . nitroGLYCERIN  1 inch Topical Once  . nitroGLYCERIN      . PARoxetine  10 mg Oral Daily  . QUEtiapine  300 mg Oral QHS  . sodium chloride  1,000 mL Intravenous Once  . sodium chloride  3 mL Intravenous Q12H  . thiamine  100 mg Oral Daily   Or  . thiamine  100 mg Intravenous Daily  . vitamin C  1,000 mg Oral Daily    Results for orders placed during the hospital encounter of 01/31/12 (from the past 48 hour(s))  CBC WITH DIFFERENTIAL     Status: Abnormal   Collection Time   01/31/12  2:54 AM      Component Value Range Comment   WBC 14.6 (*) 4.0 - 10.5 K/uL    RBC 4.37  4.22 - 5.81 MIL/uL    Hemoglobin 14.0  13.0 - 17.0 g/dL    HCT 16.1  09.6 - 04.5 %    MCV 92.0  78.0 - 100.0 fL    MCH 32.0  26.0 - 34.0 pg    MCHC 34.8  30.0 - 36.0 g/dL    RDW 40.9  81.1 - 91.4 %    Platelets 214  150 - 400 K/uL    Neutrophils Relative 68  43 - 77 %    Neutro Abs 9.9 (*) 1.7 - 7.7 K/uL    Lymphocytes Relative 22  12 - 46 %    Lymphs Abs 3.1  0.7 - 4.0 K/uL    Monocytes Relative 8  3 - 12 %    Monocytes Absolute 1.1 (*) 0.1 - 1.0 K/uL    Eosinophils Relative 3  0 - 5 %    Eosinophils Absolute 0.4  0.0 - 0.7 K/uL    Basophils Relative 0  0 - 1 %    Basophils Absolute 0.0  0.0 - 0.1 K/uL   COMPREHENSIVE METABOLIC PANEL     Status: Abnormal   Collection Time   01/31/12  2:54 AM      Component Value Range Comment   Sodium 145  135 - 145 mEq/L    Potassium 3.3 (*) 3.5 - 5.1 mEq/L    Chloride 109  96 - 112 mEq/L    CO2 24  19 - 32 mEq/L    Glucose, Bld 71  70 - 99 mg/dL    BUN 12  6 - 23 mg/dL    Creatinine, Ser 5.28  0.50 - 1.35 mg/dL    Calcium 8.5  8.4 - 41.3 mg/dL    Total Protein 5.0 (*) 6.0 - 8.3 g/dL    Albumin 2.9 (*) 3.5 - 5.2 g/dL    AST 41 (*) 0 - 37 U/L    ALT 23  0 - 53 U/L    Alkaline Phosphatase 39  39 - 117 U/L    Total Bilirubin 0.3  0.3 - 1.2 mg/dL    GFR calc non Af Amer 65 (*) >90 mL/min    GFR calc Af Amer 75 (*) >90 mL/min   LIPASE, BLOOD     Status: Normal     Collection Time   01/31/12  2:54 AM      Component Value Range Comment   Lipase 21  11 - 59 U/L   ETHANOL     Status: Abnormal   Collection Time   01/31/12  3:11 AM      Component Value Range Comment   Alcohol, Ethyl (B) 46 (*) 0 - 11 mg/dL   POCT I-STAT TROPONIN I     Status: Abnormal   Collection Time   01/31/12  3:15 AM      Component Value Range Comment   Troponin i, poc 2.56 (*) 0.00 - 0.08 ng/mL    Comment NOTIFIED PHYSICIAN      Comment 3            POCT I-STAT TROPONIN I     Status: Abnormal   Collection Time   01/31/12  5:25 AM      Component Value Range Comment   Troponin i, poc 8.59 (*) 0.00 - 0.08 ng/mL    Comment NOTIFIED PHYSICIAN      Comment 3            MRSA PCR SCREENING     Status: Normal   Collection Time   01/31/12  6:20 AM      Component Value Range Comment   MRSA by PCR NEGATIVE  NEGATIVE     Dg Chest Port 1 View  01/31/2012  *RADIOLOGY REPORT*  Clinical Data: Chest pain  PORTABLE CHEST - 1 VIEW  Comparison: 04/09/2011  Findings: Hypoaeration results in elevation of the hemidiaphragms, obscuring evaluation of the lung bases.  Allowing for this, no focal consolidation, pleural effusion, or pneumothorax.  Mild interstitial prominence.  Cardiomediastinal contours within normal range.  No acute osseous finding. Partially imaged cervical fusion hardware.  IMPRESSION: Mild interstitial prominence.  No focal consolidation identified.  Original Report Authenticated By: Waneta Martins, M.D.    Review of Systems  Constitutional: Negative for fever and diaphoresis.  HENT: Negative for congestion and sore throat.   Eyes: Negative for blurred vision.  Respiratory: Negative for cough, shortness of breath and wheezing.   Cardiovascular: Positive for chest pain. Negative for palpitations, orthopnea, leg swelling and PND.  Gastrointestinal: Negative for nausea, vomiting, abdominal pain, blood in stool and melena.  Genitourinary: Negative for dysuria and hematuria.   Musculoskeletal:       No radiation of pain to neck, arm, jaw, back.  Neurological: Negative for dizziness and headaches.   Blood pressure 123/77, pulse 69, temperature 98.8 F (37.1 C), temperature source Oral, resp. rate 22, height 6\' 4"  (1.93 m), weight 80 kg (176 lb 5.9 oz), SpO2 99.00%. Physical Exam  Constitutional: He is oriented to person, place, and time. He appears well-developed and well-nourished. No distress.  HENT:  Head: Normocephalic and atraumatic.  Eyes: EOM are normal. Pupils are equal, round, and reactive to light. No scleral icterus.  Neck: Normal range of motion. Neck supple.  Cardiovascular: Normal rate and regular rhythm.   No murmur heard. Respiratory: Effort normal. He has no rales.       BS decreased  GI: Soft. There is tenderness (6/10 periumbilical, LUQ tenderness. ).  Musculoskeletal: He exhibits no edema.  Lymphadenopathy:    He has no cervical adenopathy.  Neurological: He is alert and oriented to person, place, and time. He exhibits normal muscle tone.  Skin: Skin is warm and dry.  Psychiatric: He has a normal mood and affect.    Assessment/Plan: Patient Active Hospital Problem List: NSTEMI (non-ST elevated myocardial infarction) (01/31/2012) Substance abuse (01/31/2012) Alcohol abuse (01/31/2012) Tobacco abuse (01/31/2012)  Plan:  Second I-Stat Troponin was 8.59.  Cardiac Markers cycling.  I started IV nitro and DCd paste.  IV heparin going.  Will get urine drug screen.   Replace K+.  Will add beta blocker after UDS comes back.  History of cocaine use.  Left heart cath and possible PCI Monday.   2D echo.  KUB.     HAGER, BRYAN 01/31/2012, 8:19 AM      Agree with note written by [facility]  Pt admitted with CP. CRF + tob and family history. Drinks ETOH in excess, smokes THC and uses crack cocaine frequently ( last 1-2 days ago). Enz +, EKG shows LVH, early repol and j point elevation. Exam benign. On IV hep and NTG. Still having low grade  CP. Tele shows freq PVCs, K+ low and being repleted. Will need cardiac cath, question is timing. UDS pending. At risk for ETOH W/D.     Runell Gess 01/31/2012 12:25 PM

## 2012-02-01 DIAGNOSIS — F1010 Alcohol abuse, uncomplicated: Secondary | ICD-10-CM

## 2012-02-01 DIAGNOSIS — F17200 Nicotine dependence, unspecified, uncomplicated: Secondary | ICD-10-CM

## 2012-02-01 DIAGNOSIS — F1910 Other psychoactive substance abuse, uncomplicated: Secondary | ICD-10-CM

## 2012-02-01 DIAGNOSIS — I214 Non-ST elevation (NSTEMI) myocardial infarction: Secondary | ICD-10-CM

## 2012-02-01 LAB — LIPID PANEL
HDL: 67 mg/dL (ref >39)
LDL Cholesterol: 50 mg/dL (ref 0–99)
Total CHOL/HDL Ratio: 1.9 RATIO
VLDL: 11 mg/dL (ref 0–40)

## 2012-02-01 LAB — CBC
HCT: 40.6 % (ref 39.0–52.0)
MCH: 32.4 pg (ref 26.0–34.0)
MCHC: 35 g/dL (ref 30.0–36.0)
MCV: 92.8 fL (ref 78.0–100.0)
Platelets: 110 10*3/uL — ABNORMAL LOW (ref 150–400)
Platelets: 113 10*3/uL — ABNORMAL LOW (ref 150–400)
Platelets: 118 10*3/uL — ABNORMAL LOW (ref 150–400)
RBC: 4.14 MIL/uL — ABNORMAL LOW (ref 4.22–5.81)
RBC: 4.41 MIL/uL (ref 4.22–5.81)
RBC: 4.37 MIL/uL (ref 4.22–5.81)
RDW: 14.2 % (ref 11.5–15.5)
RDW: 14.1 % (ref 11.5–15.5)
WBC: 10.7 10*3/uL — ABNORMAL HIGH (ref 4.0–10.5)
WBC: 8 10*3/uL (ref 4.0–10.5)

## 2012-02-01 LAB — COMPREHENSIVE METABOLIC PANEL
ALT: 28 U/L (ref 0–53)
AST: 70 U/L — ABNORMAL HIGH (ref 0–37)
Albumin: 2.9 g/dL — ABNORMAL LOW (ref 3.5–5.2)
Alkaline Phosphatase: 38 U/L — ABNORMAL LOW (ref 39–117)
Calcium: 8.4 mg/dL (ref 8.4–10.5)
GFR calc Af Amer: 72 mL/min — ABNORMAL LOW (ref >90)
Potassium: 3.5 mEq/L (ref 3.5–5.1)
Sodium: 142 mEq/L (ref 135–145)
Total Protein: 5.1 g/dL — ABNORMAL LOW (ref 6.0–8.3)

## 2012-02-01 LAB — HEPARIN LEVEL (UNFRACTIONATED): Heparin Unfractionated: 0.35 IU/mL (ref 0.30–0.70)

## 2012-02-01 MED ORDER — POTASSIUM CHLORIDE CRYS ER 20 MEQ PO TBCR
EXTENDED_RELEASE_TABLET | ORAL | Status: AC
  Filled 2012-02-01: qty 2

## 2012-02-01 MED ORDER — SODIUM CHLORIDE 0.9 % IV SOLN
INTRAVENOUS | Status: DC
  Administered 2012-02-01: 20 mL/h via INTRAVENOUS

## 2012-02-01 MED ORDER — ASPIRIN EC 325 MG PO TBEC
325.0000 mg | DELAYED_RELEASE_TABLET | Freq: Every day | ORAL | Status: DC
  Administered 2012-02-01: 325 mg via ORAL
  Filled 2012-02-01 (×2): qty 1

## 2012-02-01 MED ORDER — FOLIC ACID 1 MG PO TABS: 1.0000 mg | ORAL_TABLET | Freq: Every day | ORAL | Status: DC

## 2012-02-01 MED ORDER — POTASSIUM CHLORIDE CRYS ER 20 MEQ PO TBCR
40.0000 meq | EXTENDED_RELEASE_TABLET | Freq: Once | ORAL | Status: AC
  Administered 2012-02-01: 40 meq via ORAL

## 2012-02-01 NOTE — Progress Notes (Signed)
  Echocardiogram 2D Echocardiogram has been performed.  Timothy Peck 02/01/2012, 11:37 AM

## 2012-02-01 NOTE — Progress Notes (Signed)
[facility] [place] TEAM 1 - Stepdown/ICU TEAM  PCP:  Pcp Not In System  Subjective: 51 yo thin male with a history of depression, alcohol abuse, drug abuse, tobacco abuse since age of 74, and recent cervical spine surgery. He drinks 3-4 or more 40 ounce beers per day.  He presented with complaints of L sided pressure like chest pain over the last 3 days with no particular relieving or provoking factors.  Since admit, the patient develped recurrent chest pain.  His cardiac enzymes climbed.  He was therefore taken to the cardiac cath lab on 6/29 where a ulcerated plaque in the proximal LAD with a large thrombus burden was diagnosed.    This morning the pt is resting comfortably.  He denies f/c, sob, recurrent cp, or sob.   Objective:  Intake/Output Summary (Last 24 hours) at 02/01/12 0941 Last data filed at 02/01/12 0900  Gross per 24 hour  Intake 2746.12 ml  Output   5426 ml  Net -2679.88 ml   Blood pressure 115/72, pulse 78, temperature 98.5 F (36.9 C), temperature source Oral, resp. rate 17, height 6\' 4"  (1.93 m), weight 80 kg (176 lb 5.9 oz), SpO2 96.00%.  Physical Exam: General: No acute respiratory distress Lungs: Clear to auscultation bilaterally without wheezes or crackles Abdom:  NT/ND, soft, bs+, no mass, no rebound Cardiovascular: Regular rate and rhythm without murmur gallop or rub normal S1 and S2 Extremities: No significant cyanosis, clubbing, or edema bilateral lower extremities  Lab Results:  [facility] 02/01/12 0540 01/31/12 0254  NA 142 145  K 3.5 3.3*  CL 108 109  CO2 27 24  GLUCOSE 90 71  BUN 7 12  CREATININE 1.32 1.27  CALCIUM 8.4 8.5  MG -- --  PHOS -- --    Basename 02/01/12 0540 01/31/12 0254  AST 70* 41*  ALT 28 23  ALKPHOS 38* 39  BILITOT 0.5 0.3  PROT 5.1* 5.0*  ALBUMIN 2.9* 2.9*    Basename 02/01/12 0540 01/31/12 0254  WBC 10.7* 14.6*  NEUTROABS -- 9.9*  HGB 13.3 14.0  HCT 38.4* 40.2  MCV 92.8 92.0  PLT 110* 214     Basename 01/31/12 2100 01/31/12 1200  CKTOTAL 841* 1117*  CKMB 60.2* 86.7*  CKMBINDEX -- --  TROPONINI 17.93* >20.00*   Micro Results: Recent Results (from the past 240 hour(s))  MRSA PCR SCREENING     Status: Normal   Collection Time   01/31/12  6:20 AM      Component Value Range Status Comment   MRSA by PCR NEGATIVE  NEGATIVE Final     Studies/Results: All recent x-ray/radiology reports have been reviewed in detail.   Medications: I have reviewed the patient's complete medication list.  Assessment/Plan:  NSTEMI / elevated cardiac enzymes In setting of admitted cocaine abuse - found to have ruptured plaque with large thrombus - care per [facility] - to return to cath lab 02/02/12  Polysubstance abuse To include heavy EtoH abuse, cocaine use, marijuana use, and tobacco abuse - agree with empiric CIWA protocol - educate on need for absolute abstinence - ask CSW to counsel as well  hypokalemia likley due to poor nutrition - replacing - follow trend   ANTERIOR CERVICAL DECOMPRESSION/DISCECTOMY FUSION Surgeon: Temple Pacini, MD - 11/28/2011 - no immediate evidence of complications  Dispo: Perhaps pt would be more appropriate for the [facility] service, with [facility] as a consult - will discuss with [facility]  Lonia Blood, MD [facility] Office  [phone]  Pager [phone]  On-Call/Text Page:      [email or web address]      password [facility]

## 2012-02-01 NOTE — Progress Notes (Signed)
ANTICOAGULATION CONSULT NOTE - Follow Up Consult  Pharmacy Consult for heparin Indication: chest pain/ACS  No Known Allergies  Patient Measurements: Height: 6\' 4"  (193 cm) Weight: 176 lb 5.9 oz (80 kg) IBW/kg (Calculated) : 86.8    Vital Signs: Temp: 98.5 F (36.9 C) (06/30 0729) Temp src: Oral (06/30 0729) BP: 102/57 mmHg (06/30 0000)  Labs:  Basename 02/01/12 0540 01/31/12 2100 01/31/12 1200 01/31/12 1159 01/31/12 0254  HGB 13.3 -- -- -- 14.0  HCT 38.4* -- -- -- 40.2  PLT 110* -- -- -- 214  APTT -- -- -- -- --  LABPROT -- -- -- -- --  INR -- -- -- -- --  HEPARINUNFRC 0.35 -- -- 0.49 --  CREATININE 1.32 -- -- -- 1.27  CKTOTAL -- 841* 1117* -- --  CKMB -- 60.2* 86.7* -- --  TROPONINI -- 17.93* >20.00* -- --    Estimated Creatinine Clearance: 76.6 ml/min (by C-G formula based on Cr of 1.32).  Assessment: Patient is a 51 y.o M s/p cath on 6/29 with heparin and integrilin continued after procedure d/t large thrombus noted in LAD.  No bleeding noted but platelet has decreased sharply from 214 to 110 today.  Spoke to Dr. Allyson Sabal, d/c heparin drip for now and continue with integrilin, plavix and asa.   Plan:  1) d/c heparin 2) will recheck cbc at 5 PM today 2) continue with integrilin  Denim Kalmbach P 02/01/2012,7:51 AM

## 2012-02-01 NOTE — Progress Notes (Signed)
[facility]  Subjective: No More CP.  Objective: Vital signs in last 24 hours: Temp:  [98.2 F (36.8 C)-98.6 F (37 C)] 98.5 F (36.9 C) (06/30 0729) Pulse Rate:  [44-90] 77  (06/30 0800) Resp:  [13-28] 19  (06/30 0800) BP: (102-140)/(57-96) 113/69 mmHg (06/30 0800) SpO2:  [96 %-100 %] 96 % (06/30 0800)    Intake/Output from previous day: 06/29 0701 - 06/30 0700 In: 2644.1 [P.O.:1440; I.V.:1204.1] Out: 5276 [Urine:5275; Stool:1] Intake/Output this shift: Total I/O In: 35.7 [I.V.:35.7] Out: -   Medications Current Facility-Administered Medications  Medication Dose Route Frequency Provider Last Rate Last Dose  . 0.9 %  sodium chloride infusion   Intravenous Continuous Runell Gess, MD 75 mL/hr at 01/31/12 1612    . acetaminophen (TYLENOL) tablet 650 mg  650 mg Oral Q4H PRN Runell Gess, MD   650 mg at 01/31/12 2025  . aspirin 81 MG chewable tablet        324 mg at 01/31/12 1429  . aspirin EC tablet 325 mg  325 mg Oral Daily Runell Gess, MD      . clopidogrel (PLAVIX) 300 MG tablet           . clopidogrel (PLAVIX) tablet 75 mg  75 mg Oral Q breakfast Runell Gess, MD      . eptifibatide (INTEGRILIN) 0.75 mg/mL bolus via infusion 14,400 mcg  180 mcg/kg Intravenous Once Runell Gess, MD      . eptifibatide (INTEGRILIN) 75 mg / 100 mL infusion  2 mcg/kg/min Intravenous Continuous Runell Gess, MD 12.8 mL/hr at 02/01/12 0417 2 mcg/kg/min at 02/01/12 0417  . fentaNYL (SUBLIMAZE) 0.05 MG/ML injection           . folic acid (FOLVITE) tablet 1 mg  1 mg Oral Daily Huey Bienenstock, MD   1 mg at 01/31/12 1053  . heparin 2-0.9 UNIT/ML-% infusion           . hydrALAZINE (APRESOLINE) injection 5 mg  5 mg Intravenous Q6H PRN Huey Bienenstock, MD      . ketorolac (TORADOL) 30 MG/ML injection           . lidocaine (XYLOCAINE) 1 % injection           . LORazepam (ATIVAN) tablet 1 mg  1 mg Oral Q6H PRN Huey Bienenstock, MD   1 mg at  01/31/12 1648   Or  . LORazepam (ATIVAN) injection 1 mg  1 mg Intravenous Q6H PRN Huey Bienenstock, MD      . midazolam (VERSED) 2 MG/2ML injection           . morphine 2 MG/ML injection 1 mg  1 mg Intravenous Q1H PRN Runell Gess, MD      . multivitamin with minerals tablet 1 tablet  1 tablet Oral Daily Huey Bienenstock, MD   1 tablet at 01/31/12 1054  . nicotine (NICODERM CQ - dosed in mg/24 hours) patch 21 mg  21 mg Transdermal Once Huey Bienenstock, MD      . nitroGLYCERIN (NITROSTAT) SL tablet 0.4 mg  0.4 mg Sublingual Q5 min PRN Cheri Guppy, MD      . nitroGLYCERIN (NTG ON-CALL) 0.2 mg/mL injection           . nitroGLYCERIN 0.2 mg/mL in dextrose 5 % infusion  2-200 mcg/min Intravenous Titrated Wilburt Finlay, PA 3 mL/hr at 02/01/12 0700 10 mcg/min at 02/01/12 0700  . ondansetron (ZOFRAN) 4 MG/2ML injection           .  ondansetron (ZOFRAN) injection 4 mg  4 mg Intravenous Q6H PRN Runell Gess, MD      . PARoxetine (PAXIL) tablet 10 mg  10 mg Oral Daily Huey Bienenstock, MD   10 mg at 01/31/12 1054  . potassium chloride SA (K-DUR,KLOR-CON) CR tablet 40 mEq  40 mEq Oral Once Wilburt Finlay, PA   40 mEq at 01/31/12 1052  . QUEtiapine (SEROQUEL XR) 24 hr tablet 300 mg  300 mg Oral QHS Huey Bienenstock, MD   300 mg at 01/31/12 2226  . thiamine (VITAMIN B-1) tablet 100 mg  100 mg Oral Daily Huey Bienenstock, MD   100 mg at 01/31/12 1053   Or  . thiamine (B-1) injection 100 mg  100 mg Intravenous Daily Dawood Elgergawy, MD      . vitamin C (ASCORBIC ACID) tablet 1,000 mg  1,000 mg Oral Daily Huey Bienenstock, MD   1,000 mg at 01/31/12 1054  . DISCONTD: 0.9 %  sodium chloride infusion   Intravenous Continuous Lonia Blood, MD 15 mL/hr at 01/31/12 0900 15 mL/hr at 01/31/12 0900  . DISCONTD: acetaminophen (TYLENOL) tablet 650 mg  650 mg Oral Q4H PRN Wilburt Finlay, PA   650 mg at 01/31/12 1308  . DISCONTD: aspirin EC tablet 81 mg  81 mg Oral Daily Huey Bienenstock, MD      . DISCONTD:  heparin ADULT infusion 100 units/mL (25000 units/250 mL)  1,000 Units/hr Intravenous Continuous Cheri Guppy, MD   1,000 Units/hr at 01/31/12 0550  . DISCONTD: heparin ADULT infusion 100 units/mL (25000 units/250 mL)  1,000 Units/hr Intravenous Continuous Dannielle Huh, PHARMD   1,000 Units/hr at 01/31/12 2015  . DISCONTD: morphine 2 MG/ML injection 2 mg  2 mg Intravenous Q2H PRN Huey Bienenstock, MD      . DISCONTD: ondansetron (ZOFRAN) injection 4 mg  4 mg Intravenous Q6H PRN Wilburt Finlay, PA   4 mg at 01/31/12 1335  . DISCONTD: sodium chloride 0.9 % injection 3 mL  3 mL Intravenous Q12H Huey Bienenstock, MD        PE: General appearance: alert, cooperative and no distress Lungs: clear to auscultation bilaterally Heart: regular rate and rhythm, S1, S2 normal, no murmur, click, rub or gallop Extremities: No LEE Pulses: 2+ and symmetric Groin:    Lab Results:   Basename 02/01/12 0540 01/31/12 0254  WBC 10.7* 14.6*  HGB 13.3 14.0  HCT 38.4* 40.2  PLT 110* 214   BMET  Basename 02/01/12 0540 01/31/12 0254  NA 142 145  K 3.5 3.3*  CL 108 109  CO2 27 24  GLUCOSE 90 71  BUN 7 12  CREATININE 1.32 1.27  CALCIUM 8.4 8.5   Lipid Panel     Component Value Date/Time   CHOL 128 02/01/2012 0540   TRIG 57 02/01/2012 0540   HDL 67 02/01/2012 0540   CHOLHDL 1.9 02/01/2012 0540   VLDL 11 02/01/2012 0540   LDLCALC 50 02/01/2012 0540   Cardiac Panel (last 3 results)  Basename 01/31/12 2100 01/31/12 1200  CKTOTAL 841* 1117*  CKMB 60.2* 86.7*  TROPONINI 17.93* >20.00*  RELINDX 7.2* 7.8*     Studies/Results: Timothy Peck is a 51 y.o. male  [id number]  LOCATION: FACILITY: [facility]  PHYSICIAN: Nanetta Batty, M.D.  1961/04/07  DATE OF PROCEDURE: 01/31/2012  DATE OF DISCHARGE:  [facility]  CARDIAC CATHETERIZATION  History obtained from chart review. Timothy Peck is a 51 year old single after the rectum male positive factors including 80+-pack-year  history of tobacco abuse and family history. He also routinely uses THC, alcohol and cocaine. He presented to the hospital with chest pain nausea vomiting. His enzymes were positive and continued to rise. His EKG showed no acute changes. Because of this he was brought to the cardiac catheterization laboratory to define his anatomy  PROCEDURE DESCRIPTION:  The patient was brought to the second floor  [place] Cardiac cath lab in the postabsorptive state. He Was  premedicated with IV Versed and fentanyl.Marland Kitchen His right groin  was prepped and shaved in usual sterile fashion. Xylocaine 1% was used  for local anesthesia. A 6 French sheath was inserted into the the common femoral  artery using standard Seldinger technique. 6 French right and left Judkins catheters all the 6 French pigtail catheter were used for selective coronary angiography and left ventriculography respectively. Visipaque dye was used for the entirety of the case. Retrograde aorta, left ventricular pullback pressures were recorded.  HEMODYNAMICS:  AO SYSTOLIC/AO DIASTOLIC: 122/88  LV SYSTOLIC/LV DIASTOLIC: 125/14  ANGIOGRAPHIC RESULTS:  1. Left main; normal  2. LAD; ulcerative plaque of approximately 80 with a large superimposed thrombus burden  3. Left circumflex; nondominant and normal. There was a moderate size ramus branch that was normal as well..  4. Right coronary artery; dominant and normal  5. Left ventriculography; RAO left ventriculogram was performed using  25 mL of Visipaque dye at 12 mL/second. The overall LVEF estimated  50 % Without wall motion abnormalities  IMPRESSION:Timothy Peck has an ulcerated plaque in its proximal LAD with a large thrombus burden superimposed. I believe the safest option is to treat him with Integrilin for 36-48 hours along with Plavix and aspirin. He'll need relook angiography on Monday to determine whether the thrombus will have resolved making percutaneous intervention safer. The sheath was  removed and pressure was held on the groin to achieve hemostasis after a few as measured. The patient received 300 mg of by mouth Plavix in the cath lab Integrilin infusion was initiated. He left the lab in stable condition.  Runell Gess MD, [facility]  01/31/2012  3:35 PM   Assessment/Plan  Principal Problem:  *NSTEMI (non-ST elevated myocardial infarction) Active Problems:  Substance abuse  Alcohol abuse  Tobacco abuse  S/P Urgent left heart cath yesterday due to continued CP and Troponin > 20.0.   Ulcerated plaque in its proximal LAD with a large thrombus burden superimposed. Integrilin, ASA/Plavix.  Relook cath on Monday.  Surprising lipid profile for someone with CAD: Cholesterol 128 Triglycerides 57 HDL 67 LDL Cholesterol 50  VLDL 11 Total CHOL/HDL Ratio 1.9.  Platelets Decreased.  Monitor q8hr. Heparin stopped.  Troponin decreasing.  BP controlled this AM.     LOS: 1 day    Timothy Peck, Timothy Peck 02/01/2012 8:34 AM   Agree with note written by [facility]  S/P cath yesterday with large thrombus burden prox LAD with ruptured plaque. No CP/SOB. Groin OK. Exam benign. Plts dropping. On IV integrelin, hep and PO asa and plavix. Will D/C hep and follow plts. Will ideally like to keep on integrelin another 24 hours prior to recath tomorrow by Dr. Herbie Baltimore. Troponin peaked and is dropping as well.  Runell Gess 02/01/2012 8:56 AM

## 2012-02-02 ENCOUNTER — Encounter (HOSPITAL_COMMUNITY)
Admission: EM | Disposition: A | Discharge status: Home / Self Care | Payer: Self-pay | Source: Ambulatory Visit | Attending: Internal Medicine

## 2012-02-02 DIAGNOSIS — F1010 Alcohol abuse, uncomplicated: Secondary | ICD-10-CM

## 2012-02-02 DIAGNOSIS — F1910 Other psychoactive substance abuse, uncomplicated: Secondary | ICD-10-CM

## 2012-02-02 DIAGNOSIS — Z955 Presence of coronary angioplasty implant and graft: Secondary | ICD-10-CM

## 2012-02-02 DIAGNOSIS — I214 Non-ST elevation (NSTEMI) myocardial infarction: Principal | ICD-10-CM

## 2012-02-02 DIAGNOSIS — I2510 Atherosclerotic heart disease of native coronary artery without angina pectoris: Secondary | ICD-10-CM

## 2012-02-02 HISTORY — DX: Presence of coronary angioplasty implant and graft: Z95.5

## 2012-02-02 HISTORY — DX: Atherosclerotic heart disease of native coronary artery without angina pectoris: I25.10

## 2012-02-02 HISTORY — PX: PERCUTANEOUS CORONARY STENT INTERVENTION (PCI-S): SHX5485

## 2012-02-02 LAB — POCT ACTIVATED CLOTTING TIME
Activated Clotting Time: 120 seconds
Activated Clotting Time: 249 seconds
Activated Clotting Time: 144 seconds

## 2012-02-02 LAB — CBC
HCT: 41.3 % (ref 39.0–52.0)
HCT: 40.4 % (ref 39.0–52.0)
Hemoglobin: 14.3 g/dL (ref 13.0–17.0)
MCH: 32.4 pg (ref 26.0–34.0)
MCHC: 34.5 g/dL (ref 30.0–36.0)
MCHC: 35.1 g/dL (ref 30.0–36.0)
MCV: 93.8 fL (ref 78.0–100.0)
Platelets: 122 10*3/uL — ABNORMAL LOW (ref 150–400)
Platelets: 120 10*3/uL — ABNORMAL LOW (ref 150–400)
RBC: 4.45 MIL/uL (ref 4.22–5.81)
RBC: 4.48 MIL/uL (ref 4.22–5.81)
RDW: 14 % (ref 11.5–15.5)
RDW: 13.8 % (ref 11.5–15.5)
WBC: 8.9 10*3/uL (ref 4.0–10.5)

## 2012-02-02 LAB — MAGNESIUM: Magnesium: 1.8 mg/dL (ref 1.5–2.5)

## 2012-02-02 LAB — PROTIME-INR
INR: 1.01 (ref 0.00–1.49)
Prothrombin Time: 13.5 seconds (ref 11.6–15.2)

## 2012-02-02 LAB — BASIC METABOLIC PANEL
BUN: 7 mg/dL (ref 6–23)
Calcium: 8.6 mg/dL (ref 8.4–10.5)
Chloride: 104 mEq/L (ref 96–112)
Creatinine, Ser: 1.27 mg/dL (ref 0.50–1.35)
GFR calc Af Amer: 75 mL/min — ABNORMAL LOW (ref >90)
GFR calc non Af Amer: 65 mL/min — ABNORMAL LOW (ref >90)

## 2012-02-02 SURGERY — PERCUTANEOUS CORONARY STENT INTERVENTION (PCI-S)
Anesthesia: LOCAL

## 2012-02-02 MED ORDER — ACETAMINOPHEN 325 MG PO TABS: 650.0000 mg | ORAL_TABLET | ORAL | Status: DC | PRN

## 2012-02-02 MED ORDER — SODIUM CHLORIDE 0.9 % IV SOLN: 1.0000 mL/kg/h | INTRAVENOUS | Status: DC

## 2012-02-02 MED ORDER — SODIUM CHLORIDE 0.9 % IJ SOLN: 3.0000 mL | INTRAMUSCULAR | Status: DC | PRN

## 2012-02-02 MED ORDER — MIDAZOLAM HCL 2 MG/2ML IJ SOLN
INTRAMUSCULAR | Status: AC
  Filled 2012-02-02: qty 2

## 2012-02-02 MED ORDER — SODIUM CHLORIDE 0.9 % IV SOLN: 250.0000 mL | INTRAVENOUS | Status: DC

## 2012-02-02 MED ORDER — ATORVASTATIN CALCIUM 80 MG PO TABS
80.0000 mg | ORAL_TABLET | Freq: Every day | ORAL | Status: DC
  Administered 2012-02-02: 80 mg via ORAL
  Filled 2012-02-02 (×3): qty 1

## 2012-02-02 MED ORDER — SODIUM CHLORIDE 0.9 % IJ SOLN: 3.0000 mL | Freq: Two times a day (BID) | INTRAMUSCULAR | Status: DC

## 2012-02-02 MED ORDER — HYDRALAZINE HCL 20 MG/ML IJ SOLN
20.0000 mg | INTRAMUSCULAR | Status: DC | PRN
  Filled 2012-02-02: qty 1

## 2012-02-02 MED ORDER — ASPIRIN 81 MG PO CHEW
CHEWABLE_TABLET | ORAL | Status: AC
  Filled 2012-02-02: qty 4

## 2012-02-02 MED ORDER — ASPIRIN EC 325 MG PO TBEC
325.0000 mg | DELAYED_RELEASE_TABLET | Freq: Every day | ORAL | Status: DC
  Filled 2012-02-02: qty 1

## 2012-02-02 MED ORDER — SODIUM CHLORIDE 0.9 % IV SOLN: 250.0000 mL | INTRAVENOUS | Status: DC | PRN

## 2012-02-02 MED ORDER — SODIUM CHLORIDE 0.9 % IJ SOLN
3.0000 mL | Freq: Two times a day (BID) | INTRAMUSCULAR | Status: DC
  Administered 2012-02-02: 07:00:00 via INTRAVENOUS

## 2012-02-02 MED ORDER — FENTANYL CITRATE 0.05 MG/ML IJ SOLN
INTRAMUSCULAR | Status: AC
  Filled 2012-02-02: qty 2

## 2012-02-02 MED ORDER — ASPIRIN 81 MG PO CHEW
324.0000 mg | CHEWABLE_TABLET | ORAL | Status: AC
  Administered 2012-02-02: 324 mg via ORAL

## 2012-02-02 MED ORDER — ONDANSETRON HCL 4 MG/2ML IJ SOLN: 4.0000 mg | Freq: Four times a day (QID) | INTRAMUSCULAR | Status: DC | PRN

## 2012-02-02 MED ORDER — EPTIFIBATIDE 75 MG/100ML IV SOLN
INTRAVENOUS | Status: AC
  Filled 2012-02-02: qty 100

## 2012-02-02 MED ORDER — SODIUM CHLORIDE 0.9 % IV SOLN
1.0000 mL/kg/h | INTRAVENOUS | Status: AC
  Administered 2012-02-02: 1 mL/kg/h via INTRAVENOUS

## 2012-02-02 NOTE — Care Management Note (Signed)
    Page 1 of 1   02/02/2012     10:33:47 AM   CARE MANAGEMENT NOTE 02/02/2012  Patient:  Timothy Peck, Timothy Peck   Account Number:  [id number]  Date Initiated:  02/02/2012  Documentation initiated by:  Junius Creamer  Subjective/Objective Assessment:   adm w mi     Action/Plan:   lives w wife, has medicaid so his meds or 1.00 to 3.00   Anticipated DC Date:     Anticipated DC Plan:        DC Planning Services  CM consult      Choice offered to / List presented to:             Status of service:   Medicare Important Message given?   (If response is "NO", the following Medicare IM given date fields will be blank) Date Medicare IM given:   Date Additional Medicare IM given:    Discharge Disposition:    Per UR Regulation:  Reviewed for med. necessity/level of care/duration of stay  If discussed at Long Length of Stay Meetings, dates discussed:    Comments:  7/1 10:32a debbie Makaia Rappa rn,bsn [phone]

## 2012-02-02 NOTE — CV Procedure (Signed)
[facility] PERCUTANEOUS CORONARY INTERVENTION REPORT  NAME:  DEMARLO RIOJAS   MRN: [id number] DOB:  05/23/1961   ADMIT DATE: 01/31/2012  INTERVENTIONAL CARDIOLOGIST: Marykay Lex, M.D., MS PRIMARY CARE PROVIDER: Pcp Not In System PRIMARY CARDIOLOGIST: Nanetta Batty, M.D.   PATIENT:  Timothy Peck is a 51 y.o. male who presented to [facility] emergency room early the morning of Saturday 6/29 with ACS/unstable angina.  He suddenly ruled in for non-ST elevation MI, and continued to have pain to the course of the day was therefore taken in the Cath Lab with Dr. Allyson Sabal. This catheterization revealed proximal LAD lesion that is extremely in nature. The patient was placed on Heparin and Integrilin over the course the weekend for medical stabilization. He the chest pain-free. He is now referred for relook angiography with possible we'll/likely PCI of the LAD lesion.   PRE-OPERATIVE DIAGNOSIS:    NSTEMI  Previously documented Proximal LAD Lesion.  PROCEDURES PERFORMED:    Successful Percutaneous Intervention on the Proximal to Mid LAD with a Promus DES 4.0 mm x 28 mm  PROCEDURE: Consent: The procedure with Risks/Benefits/Alternatives and Indications was reviewed with the patient.  All questions were answered.    Risks / Complications include, but not limited to: Death, MI, CVA/TIA, VF/VT (with defibrillation), Bradycardia (need for temporary pacer placement), contrast induced nephropathy, bleeding / bruising / hematoma / pseudoaneurysm, vascular or coronary injury (with possible emergent CT or Vascular Surgery), adverse medication reactions, infection.    The patient voiced understanding and agree to proceed.     Consent for signed by MD and patient with RN witness -- placed on chart.  Procedure: The patient was brought to the 2nd Floor [facility]iac Catheterization Lab in the fasting state and prepped and draped in the usual sterile fashion for Right groin  access. Sterile technique was used including antiseptics, cap, gloves, gown, hand hygiene, mask and sheet.  Skin prep: Chlorhexidine;  Time Out: Verified patient identification, verified procedure, site/side was marked, verified correct patient position, special equipment/implants available, medications/allergies/relevent history reviewed, required imaging and test results available.  Performed   Access: Right Common Femoral Artery; 5 Fr Sheath; fluoroscopically guided, modified Seldinger technique  Diagnostic Left Coronary Artery Angiography:  JL 4  Revealed residual 70-80% lesion ranging from the proximal to mid LAD between the ostia of D1 and SP1, there is minimal residual thrombus present. The rest of the LAD is free of any significant disease the very tortuous Hemodynamics:  Central Aortic / Mean Pressures: 135/87 mmHg; 109 mmHg   PCI:  Sheath exchanged for 6 Fr  IV Heparin bolus administered to achieve ACT greater than 200 prior to wire advancement; see below Guide: 6 Fr   XB LAD 3.5  Guidewire: BMW  Predilation Balloon: Emerge Monorail 3.0 mm x 15 mm; 8 Atm x 30 Sec  Stent: Promus Element DES 4.0 mm x  28 mm; 12 Atm x 30 Sec, 14 Atm x 45 Sec -- final diameter 4.1 mm  Post-dilatation Balloon:  Quantum Apex 4.0 mm x 15 mm; 16 Atm x 45 Sec -- 3 sequential inflations from distal to proximal stent. Post-deployment cineangiography with and without guidewire in place was performed in multiple orthogonal views demonstrating excellent stent deployment and did not reveal evidence of dissection or perforation  The sheath was sutured in place, to be removed on 2500 according to the ACT.  ANESTHESIA:   Local Lidocaine 18 ml SEDATION:  3 mg IV Versed, 75  mcg IV Fentanyl; MEDICATIONS: Continue Integrilin infusion during procedure; IV Heparin 2 separate boluses -- 5000 units, 2000 units  EBL:   < 20 ml  PATIENT DISPOSITION:    The patient was stable before, during, and after the  procedure.   He tolerated the procedure well without any complications.  He was transported to the PACU holding area in a chest pain-free, hemodynamically stable condition  POST-OPERATIVE DIAGNOSIS:    Successful PCI of the proximal into the mid LAD with a Promus Element DES 4.0 mm x 28 mm  PLAN OF CARE:  Transfer to 2500 for post PCI care.  Monitor post Integrilin CBCs. Need to monitor thrombocytopenia, may need to use Angiomax if platelets drop.  Continue dual Antiplatelet Therapy for minimum of 1 year.  I have counseled the case management to assist.  Continue to adjust her medications prior to discharge.   Marykay Lex, M.D., M.S. [facility] [address] [place], [place]  [address]  [phone]  02/02/2012 6:33 PM

## 2012-02-02 NOTE — Progress Notes (Addendum)
[facility]  Subjective: No CP or SOB  Objective: Vital signs in last 24 hours: Temp:  [97.7 F (36.5 C)-98.9 F (37.2 C)] 98.9 F (37.2 C) (07/01 0400) Pulse Rate:  [49-86] 58  (07/01 0900) Resp:  [15-20] 17  (07/01 0900) BP: (98-119)/(60-85) 119/76 mmHg (07/01 0900) SpO2:  [96 %-99 %] 98 % (07/01 0900) Weight:  [80 kg (176 lb 5.9 oz)] 80 kg (176 lb 5.9 oz) (07/01 0343) Last BM Date: 02/01/12  Intake/Output from previous day: 06/30 0701 - 07/01 0700 In: 1881.9 [P.O.:860; I.V.:1021.9] Out: 3900 [Urine:3900] Intake/Output this shift: Total I/O In: 160 [I.V.:160] Out: -   Medications Current Facility-Administered Medications  Medication Dose Route Frequency Provider Last Rate Last Dose  . 0.9 %  sodium chloride infusion  250 mL Intravenous PRN Wilburt Finlay, PA      . 0.9 %  sodium chloride infusion  1 mL/kg/hr Intravenous Continuous Wilburt Finlay, PA      . 0.9 %  sodium chloride infusion   Intravenous Continuous Runell Gess, MD 20 mL/hr at 02/01/12 1008 20 mL/hr at 02/01/12 1008  . acetaminophen (TYLENOL) tablet 650 mg  650 mg Oral Q4H PRN Runell Gess, MD   650 mg at 01/31/12 2025  . aspirin 81 MG chewable tablet           . aspirin chewable tablet 324 mg  324 mg Oral Pre-Cath Wilburt Finlay, [place]   324 mg at 02/02/12 1610  . aspirin EC tablet 325 mg  325 mg Oral Daily Lonia Blood, MD      . clopidogrel (PLAVIX) tablet 75 mg  75 mg Oral Q breakfast Runell Gess, MD   75 mg at 02/02/12 0858  . eptifibatide (INTEGRILIN) 0.75 mg/mL bolus via infusion 14,400 mcg  180 mcg/kg Intravenous Once Runell Gess, MD      . eptifibatide (INTEGRILIN) 75 mg / 100 mL infusion  2 mcg/kg/min Intravenous Continuous Runell Gess, MD 12.8 mL/hr at 02/02/12 0012 2 mcg/kg/min at 02/02/12 0012  . folic acid (FOLVITE) tablet 1 mg  1 mg Oral Daily Huey Bienenstock, MD   1 mg at 02/01/12 1003  . hydrALAZINE (APRESOLINE) injection 5 mg  5 mg Intravenous  Q6H PRN Huey Bienenstock, MD      . LORazepam (ATIVAN) tablet 1 mg  1 mg Oral Q6H PRN Huey Bienenstock, MD   1 mg at 01/31/12 1648   Or  . LORazepam (ATIVAN) injection 1 mg  1 mg Intravenous Q6H PRN Huey Bienenstock, MD      . morphine 2 MG/ML injection 1 mg  1 mg Intravenous Q1H PRN Runell Gess, MD      . multivitamin with minerals tablet 1 tablet  1 tablet Oral Daily Huey Bienenstock, MD   1 tablet at 02/01/12 1003  . nicotine (NICODERM CQ - dosed in mg/24 hours) patch 21 mg  21 mg Transdermal Once Huey Bienenstock, MD      . nitroGLYCERIN (NITROSTAT) SL tablet 0.4 mg  0.4 mg Sublingual Q5 min PRN Cheri Guppy, MD      . nitroGLYCERIN 0.2 mg/mL in dextrose 5 % infusion  2-200 mcg/min Intravenous Titrated Wilburt Finlay, PA 3 mL/hr at 02/01/12 2135 10 mcg/min at 02/01/12 2135  . ondansetron (ZOFRAN) injection 4 mg  4 mg Intravenous Q6H PRN Runell Gess, MD      . PARoxetine (PAXIL) tablet 10 mg  10 mg Oral Daily Huey Bienenstock, MD  10 mg at 02/01/12 1003  . potassium chloride SA (K-DUR,KLOR-CON) 20 MEQ CR tablet           . potassium chloride SA (K-DUR,KLOR-CON) CR tablet 40 mEq  40 mEq Oral Once Lonia Blood, MD   40 mEq at 02/01/12 1014  . QUEtiapine (SEROQUEL XR) 24 hr tablet 300 mg  300 mg Oral QHS Huey Bienenstock, MD   300 mg at 02/01/12 2143  . sodium chloride 0.9 % injection 3 mL  3 mL Intravenous Q12H Wilburt Finlay, PA      . sodium chloride 0.9 % injection 3 mL  3 mL Intravenous PRN Wilburt Finlay, PA      . thiamine (VITAMIN B-1) tablet 100 mg  100 mg Oral Daily Huey Bienenstock, MD   100 mg at 02/01/12 1003   Or  . thiamine (B-1) injection 100 mg  100 mg Intravenous Daily Huey Bienenstock, MD      . vitamin C (ASCORBIC ACID) tablet 1,000 mg  1,000 mg Oral Daily Huey Bienenstock, MD   1,000 mg at 02/01/12 1003  . DISCONTD: aspirin EC tablet 325 mg  325 mg Oral Daily Runell Gess, MD      . DISCONTD: aspirin EC tablet 325 mg  325 mg Oral Daily Wilburt Finlay, [place]   325  mg at 02/01/12 1003  . DISCONTD: folic acid (FOLVITE) tablet 1 mg  1 mg Oral Daily Lonia Blood, MD        PE: General appearance: alert, cooperative and no distress  Lungs: clear to auscultation bilaterally  Heart: regular rate and rhythm, S1, S2 normal, no murmur, click, rub or gallop  Extremities: No LEE  Pulses: 2+ and symmetric  Lab Results:   Basename 02/02/12 0429 02/01/12 2100 02/01/12 1200  WBC 7.1 8.0 10.3  HGB 14.3 14.0 14.3  HCT 41.3 40.6 40.8  PLT 106* 118* 113*   BMET  Basename 02/02/12 0429 02/01/12 0540 01/31/12 0254  NA 139 142 145  K 3.7 3.5 3.3*  CL 104 108 109  CO2 26 27 24   GLUCOSE 100* 90 71  BUN 7 7 12   CREATININE 1.27 1.32 1.27  CALCIUM 8.6 8.4 8.5   PT/INR No results found for this basename: LABPROT:3,INR:3 in the last 72 hours Cholesterol  Basename 02/01/12 0540  CHOL 128   Cardiac Panel (last 3 results)  Basename 01/31/12 2100 01/31/12 1200  CKTOTAL 841* 1117*  CKMB 60.2* 86.7*  TROPONINI 17.93* >20.00*  RELINDX 7.2* 7.8*   Studies/Results: @RISRSLT2 @   Assessment/Plan  Principal Problem:  *NSTEMI (non-ST elevated myocardial infarction) Active Problems:  Substance abuse  Alcohol abuse  Tobacco abuse  Plan:  No further CP.   Net fluids negative 4.8L in the last two days.  For relook cath and possible  PCI today.  Ok for lite breakfast.  On integrilin and nitro.   Thrombocytopenia, acute drop.  Heparin DCd.  Platelets holding steady.         LOS: 2 days    Timothy Peck, Timothy Peck 02/02/2012 9:14 AM  I have seen & examined the patient. I agree with [name] note above.  Plan is relook LAD +/- PCI to LAD lesion.  Will need to send of PF4 (HITT) panel -- ? If thrombocytopenia was due to HITT vs. Integrilin. -- either way, both will be d/c'd today. Plavix started over the weekend. Echo with normal EF - can use CCB +/- ACE-I on d/c. -- will start with Diltiazem post cath for  rate control & antianginal effect.  Will need  statin prior to d/c  Counseled on importance of Smoking, Cocaine & EtOH abuse on his long-term health outcome. He needs to abstain from Cocaine & stop smoking.  EtOH will be more difficult.  Continue Thiamine & Nicoderm patch.   The planned procedure has been fully reviewed with the patient and written informed consent has been obtained.   Timothy Peck, M.D., M.S. [facility] [address] [place], [place]  [address]  [phone] Pager # [phone]  02/02/2012 12:35 PM

## 2012-02-02 NOTE — Progress Notes (Addendum)
[facility] [place] TEAM 1 - Stepdown/ICU TEAM  PCP:  Pcp Not In System  Subjective: 51 yo thin male with a history of depression, alcohol abuse, drug abuse, tobacco abuse since age of 81, and recent cervical spine surgery. He drinks 3-4 or more 40 ounce beers per day.  He presented with complaints of L sided pressure like chest pain over the last 3 days with no particular relieving or provoking factors.  Since admit, the patient develped recurrent chest pain.  His cardiac enzymes climbed.  He was therefore taken to the cardiac cath lab on 6/29 where a ulcerated plaque in the proximal LAD with a large thrombus burden was diagnosed.    Pt is resting comfortably.  Denies f/c, sob, n/v, chest pain, or SOB.  Is anxious to go home.    Objective:  Intake/Output Summary (Last 24 hours) at 02/02/12 1257 Last data filed at 02/02/12 1200  Gross per 24 hour  Intake 1576.4 ml  Output   2650 ml  Net -1073.6 ml   Blood pressure 124/69, pulse 84, temperature 98.9 F (37.2 C), temperature source Oral, resp. rate 24, height 6\' 4"  (1.93 m), weight 80 kg (176 lb 5.9 oz), SpO2 99.00%.  Physical Exam: General: No acute respiratory distress Lungs: Clear to auscultation bilaterally without wheezes or crackles Abdom:  NT/ND, soft, bs+, no mass, no rebound Cardiovascular: Regular rate and rhythm without murmur gallop or rub normal S1 and S2 Extremities: No significant cyanosis, clubbing, or edema bilateral lower extremities  Lab Results:  Basename 02/02/12 0429 02/01/12 0540 01/31/12 0254  NA 139 142 145  K 3.7 3.5 3.3*  CL 104 108 109  CO2 26 27 24   GLUCOSE 100* 90 71  BUN 7 7 12   CREATININE 1.27 1.32 1.27  CALCIUM 8.6 8.4 8.5  MG 1.8 -- --  PHOS 3.7 -- --    [facility] 02/01/12 0540 01/31/12 0254  AST 70* 41*  ALT 28 23  ALKPHOS 38* 39  BILITOT 0.5 0.3  PROT 5.1* 5.0*  ALBUMIN 2.9* 2.9*    Basename 02/02/12 0429 02/01/12 2100 02/01/12 1200 01/31/12 0254  WBC 7.1 8.0 10.3 --    NEUTROABS -- -- -- 9.9*  HGB 14.3 14.0 14.3 --  HCT 41.3 40.6 40.8 --  MCV 92.8 92.9 92.5 --  PLT 106* 118* 113* --    Basename 01/31/12 2100 01/31/12 1200  CKTOTAL 841* 1117*  CKMB 60.2* 86.7*  CKMBINDEX -- --  TROPONINI 17.93* >20.00*   Micro Results: Recent Results (from the past 240 hour(s))  MRSA PCR SCREENING     Status: Normal   Collection Time   01/31/12  6:20 AM      Component Value Range Status Comment   MRSA by PCR NEGATIVE  NEGATIVE Final     Studies/Results: All recent x-ray/radiology reports have been reviewed in detail.   Medications: I have reviewed the patient's complete medication list.  Assessment/Plan:  NSTEMI / elevated cardiac enzymes In setting of admitted cocaine abuse - found to have ruptured plaque with large thrombus - care per [facility] - to return to cath lab today  Polysubstance abuse To include heavy EtoH abuse, cocaine use, marijuana use, and tobacco abuse - no evidence of withdrawal sx - educated on need for absolute abstinence - asked CSW to counsel as well  hypokalemia likley due to poor nutrition - replaced - now stable    Thrombocytopenia [facility] is investigating - no evidence of acute bleeding complications - admit PLT count  was 214  ANTERIOR CERVICAL DECOMPRESSION/DISCECTOMY FUSION Surgeon: Temple Pacini, MD - 11/28/2011 - no immediate evidence of complications  Dispo: Discussed w/ [facility] - they will assume attending role as other non-cardiac issues are all stable at this time - [facility] will sign off  Lonia Blood, MD [facility] Office  [phone] Pager [phone]  On-Call/Text Page:      [email or web address]      password [facility]

## 2012-02-03 ENCOUNTER — Other Ambulatory Visit: Payer: Self-pay

## 2012-02-03 DIAGNOSIS — R001 Bradycardia, unspecified: Secondary | ICD-10-CM

## 2012-02-03 LAB — CBC
MCH: 32.4 pg (ref 26.0–34.0)
MCHC: 35 g/dL (ref 30.0–36.0)
MCV: 92.5 fL (ref 78.0–100.0)
Platelets: 135 10*3/uL — ABNORMAL LOW (ref 150–400)
RBC: 4.54 MIL/uL (ref 4.22–5.81)
RDW: 13.8 % (ref 11.5–15.5)

## 2012-02-03 LAB — BASIC METABOLIC PANEL
Calcium: 8.6 mg/dL (ref 8.4–10.5)
Creatinine, Ser: 1.22 mg/dL (ref 0.50–1.35)
GFR calc Af Amer: 79 mL/min — ABNORMAL LOW (ref >90)
GFR calc non Af Amer: 68 mL/min — ABNORMAL LOW (ref >90)
Sodium: 141 mEq/L (ref 135–145)

## 2012-02-03 LAB — CARDIAC PANEL(CRET KIN+CKTOT+MB+TROPI)
CK, MB: 4 ng/mL (ref 0.3–4.0)
Troponin I: 3.68 ng/mL (ref <0.30)

## 2012-02-03 MED ORDER — ASPIRIN 81 MG PO TBEC: 81.0000 mg | DELAYED_RELEASE_TABLET | Freq: Every day | ORAL | Status: AC

## 2012-02-03 MED ORDER — ATORVASTATIN CALCIUM 80 MG PO TABS: 80.0000 mg | ORAL_TABLET | Freq: Every day | ORAL | Status: DC

## 2012-02-03 MED ORDER — FOLIC ACID 1 MG PO TABS: 1.0000 mg | ORAL_TABLET | Freq: Every day | ORAL | Status: AC

## 2012-02-03 MED ORDER — TICAGRELOR 90 MG PO TABS: 90.0000 mg | ORAL_TABLET | Freq: Two times a day (BID) | ORAL | Status: DC

## 2012-02-03 MED ORDER — FOLIC ACID 1 MG PO TABS: 1.0000 mg | ORAL_TABLET | Freq: Every day | ORAL | Status: DC

## 2012-02-03 MED ORDER — TICAGRELOR 90 MG PO TABS
90.0000 mg | ORAL_TABLET | Freq: Two times a day (BID) | ORAL | Status: DC
  Administered 2012-02-03: 90 mg via ORAL
  Filled 2012-02-03 (×2): qty 1

## 2012-02-03 MED ORDER — ASPIRIN EC 81 MG PO TBEC: 81.0000 mg | DELAYED_RELEASE_TABLET | Freq: Every day | ORAL | Status: DC

## 2012-02-03 MED ORDER — ASPIRIN 325 MG PO TBEC: 81.0000 mg | DELAYED_RELEASE_TABLET | Freq: Every day | ORAL | Status: DC

## 2012-02-03 NOTE — Progress Notes (Signed)
CARDIAC REHAB PHASE I   PRE:  Rate/Rhythm: 61 SR    BP: sitting 112/81    SaO2: 99 RA  MODE:  Ambulation: 500 ft   POST:  Rate/Rhythm: 62 SR    BP: sitting 110/70     SaO2:   Pt c/o dizziness upon walking which continued entire walk. Noticed HR fluctuates from 49-73 SR at rest. QRS seems narrow but changes periodically. Pt sts he has palpitations chronically. Stated he was having palpitations after the walk while sitting. Monitor showed HR 50 SB. Ed completed with pt with good understanding of cardiac issues. Sts he can quit substance abuse on his own. Interested in [facility] and requests his name be sent to [place] [facility]. [phone]  Harriet Masson CES, ACSM

## 2012-02-03 NOTE — Progress Notes (Signed)
[facility]  Subjective: No complaints or CP or SOB  Objective: Vital signs in last 24 hours: Temp:  [97.6 F (36.4 C)-98.1 F (36.7 C)] 97.7 F (36.5 C) (07/02 0822) Pulse Rate:  [43-87] 57  (07/02 0822) Resp:  [12-24] 16  (07/02 0822) BP: (110-162)/(58-101) 110/70 mmHg (07/02 0822) SpO2:  [96 %-100 %] 99 % (07/02 0822) Weight:  [80.5 kg (177 lb 7.5 oz)] 80.5 kg (177 lb 7.5 oz) (07/02 0409) Last BM Date: 02/02/12  Intake/Output from previous day: 07/01 0701 - 07/02 0700 In: 1775.9 [P.O.:510; I.V.:1265.9] Out: 2500 [Urine:2500] Intake/Output this shift:    Medications Current Facility-Administered Medications  Medication Dose Route Frequency Provider Last Rate Last Dose  . 0.9 %  sodium chloride infusion  1 mL/kg/hr Intravenous Continuous Marykay Lex, MD 80 mL/hr at 02/02/12 2029 1 mL/kg/hr at 02/02/12 2029  . 0.9 %  sodium chloride infusion  250 mL Intravenous Continuous Marykay Lex, MD      . acetaminophen (TYLENOL) tablet 650 mg  650 mg Oral Q4H PRN Marykay Lex, MD      . aspirin 81 MG chewable tablet           . aspirin EC tablet 325 mg  325 mg Oral Daily Lonia Blood, MD      . atorvastatin (LIPITOR) tablet 80 mg  80 mg Oral q1800 Marykay Lex, MD   80 mg at 02/02/12 2351  . clopidogrel (PLAVIX) tablet 75 mg  75 mg Oral Q breakfast Runell Gess, MD   75 mg at 02/02/12 0858  . eptifibatide (INTEGRILIN) 75 mg / 100 mL infusion           . fentaNYL (SUBLIMAZE) 0.05 MG/ML injection           . folic acid (FOLVITE) tablet 1 mg  1 mg Oral Daily Huey Bienenstock, MD   1 mg at 02/02/12 0926  . hydrALAZINE (APRESOLINE) injection 20 mg  20 mg Intravenous PRN Marykay Lex, MD      . LORazepam (ATIVAN) tablet 1 mg  1 mg Oral Q6H PRN Huey Bienenstock, MD   1 mg at 01/31/12 1648   Or  . LORazepam (ATIVAN) injection 1 mg  1 mg Intravenous Q6H PRN Huey Bienenstock, MD      . midazolam (VERSED) 2 MG/2ML injection           .  midazolam (VERSED) 2 MG/2ML injection           . ondansetron (ZOFRAN) injection 4 mg  4 mg Intravenous Q6H PRN Marykay Lex, MD      . PARoxetine (PAXIL) tablet 10 mg  10 mg Oral Daily Huey Bienenstock, MD   10 mg at 02/02/12 0926  . QUEtiapine (SEROQUEL XR) 24 hr tablet 300 mg  300 mg Oral QHS Huey Bienenstock, MD   300 mg at 02/02/12 2352  . sodium chloride 0.9 % injection 3 mL  3 mL Intravenous Q12H Marykay Lex, MD      . sodium chloride 0.9 % injection 3 mL  3 mL Intravenous PRN Marykay Lex, MD      . vitamin C (ASCORBIC ACID) tablet 1,000 mg  1,000 mg Oral Daily Huey Bienenstock, MD   1,000 mg at 02/02/12 0926  . DISCONTD: 0.9 %  sodium chloride infusion  250 mL Intravenous PRN Wilburt Finlay, PA      . DISCONTD: 0.9 %  sodium chloride infusion  1 mL/kg/hr Intravenous Continuous Wilburt Finlay, PA      . DISCONTD: 0.9 %  sodium chloride infusion   Intravenous Continuous Runell Gess, MD 20 mL/hr at 02/01/12 1008 20 mL/hr at 02/01/12 1008  . DISCONTD: 0.9 %  sodium chloride infusion  1 mL/kg/hr Intravenous Continuous Lonia Blood, MD      . DISCONTD: acetaminophen (TYLENOL) tablet 650 mg  650 mg Oral Q4H PRN Runell Gess, MD   650 mg at 01/31/12 2025  . DISCONTD: eptifibatide (INTEGRILIN) 0.75 mg/mL bolus via infusion 14,400 mcg  180 mcg/kg Intravenous Once Runell Gess, MD      . DISCONTD: eptifibatide (INTEGRILIN) 75 mg / 100 mL infusion  2 mcg/kg/min Intravenous Continuous Runell Gess, MD 12.8 mL/hr at 02/02/12 0942 2 mcg/kg/min at 02/02/12 0942  . DISCONTD: hydrALAZINE (APRESOLINE) injection 5 mg  5 mg Intravenous Q6H PRN Huey Bienenstock, MD      . DISCONTD: morphine 2 MG/ML injection 1 mg  1 mg Intravenous Q1H PRN Runell Gess, MD      . DISCONTD: multivitamin with minerals tablet 1 tablet  1 tablet Oral Daily Huey Bienenstock, MD   1 tablet at 02/02/12 0926  . DISCONTD: nicotine (NICODERM CQ - dosed in mg/24 hours) patch 21 mg  21 mg Transdermal Once Huey Bienenstock, MD      . DISCONTD: nitroGLYCERIN (NITROSTAT) SL tablet 0.4 mg  0.4 mg Sublingual Q5 min PRN Cheri Guppy, MD      . DISCONTD: nitroGLYCERIN 0.2 mg/mL in dextrose 5 % infusion  2-200 mcg/min Intravenous Titrated Wilburt Finlay, PA   10 mcg/min at 02/01/12 2135  . DISCONTD: ondansetron (ZOFRAN) injection 4 mg  4 mg Intravenous Q6H PRN Runell Gess, MD      . DISCONTD: sodium chloride 0.9 % injection 3 mL  3 mL Intravenous Q12H Wilburt Finlay, PA      . DISCONTD: sodium chloride 0.9 % injection 3 mL  3 mL Intravenous PRN Wilburt Finlay, PA      . DISCONTD: thiamine (B-1) injection 100 mg  100 mg Intravenous Daily Huey Bienenstock, MD      . DISCONTD: thiamine (VITAMIN B-1) tablet 100 mg  100 mg Oral Daily Huey Bienenstock, MD   100 mg at 02/02/12 1000    PE: General appearance: alert, cooperative and no distress Lungs: clear to auscultation bilaterally Heart: regular rate and rhythm, S1, S2 normal, no murmur, click, rub or gallop Extremities: No LEE Pulses: 2+ and symmetric Neurologic: Grossly normal Skin:  Warm and dry.  Small hematoma at right groin cath site.  Minial ecchymosis.  Nontender and no bruit.    Lab Results:   Basename 02/03/12 0425 02/02/12 2235 02/02/12 1255  WBC 8.7 8.9 7.8  HGB 14.7 14.2 14.5  HCT 42.0 40.4 42.0  PLT 135* 120* 122*   BMET  Basename 02/03/12 0425 02/02/12 0429 02/01/12 0540  NA 141 139 142  K 4.0 3.7 3.5  CL 107 104 108  CO2 25 26 27   GLUCOSE 88 100* 90  BUN 11 7 7   CREATININE 1.22 1.27 1.32  CALCIUM 8.6 8.6 8.4   PT/INR  Basename 02/02/12 0821  LABPROT 13.5  INR 1.01   Cholesterol  Basename 02/01/12 0540  CHOL 128   Cardiac Panel (last 3 results)  Basename 02/03/12 0500 01/31/12 2100 01/31/12 1200  CKTOTAL 137 841* 1117*  CKMB 4.0 60.2* 86.7*  TROPONINI 3.68* 17.93* >20.00*  RELINDX 2.9* 7.2* 7.8*  Studies/Results: PRE-OPERATIVE DIAGNOSIS:  NSTEMI  Previously documented Proximal LAD Lesion. PROCEDURES  PERFORMED:  Successful Percutaneous Intervention on the Proximal to Mid LAD with a Promus DES 4.0 mm x 28 mm PROCEDURE:  Consent: The procedure with Risks/Benefits/Alternatives and Indications was reviewed with the patient. All questions were answered.  Risks / Complications include, but not limited to: Death, MI, CVA/TIA, VF/VT (with defibrillation), Bradycardia (need for temporary pacer placement), contrast induced nephropathy, bleeding / bruising / hematoma / pseudoaneurysm, vascular or coronary injury (with possible emergent CT or Vascular Surgery), adverse medication reactions, infection.  The patient voiced understanding and agree to proceed.  Consent for signed by MD and patient with RN witness -- placed on chart.  Procedure: The patient was brought to the 2nd Floor [facility] in the fasting state and prepped and draped in the usual sterile fashion for Right groin access. Sterile technique was used including antiseptics, cap, gloves, gown, hand hygiene, mask and sheet.  Skin prep: Chlorhexidine;  Time Out: Verified patient identification, verified procedure, site/side was marked, verified correct patient position, special equipment/implants available, medications/allergies/relevent history reviewed, required imaging and test results available. Performed  Access: Right Common Femoral Artery; 5 Fr Sheath; fluoroscopically guided, modified Seldinger technique  Diagnostic Left Coronary Artery Angiography: JL 4  Revealed residual 70-80% lesion ranging from the proximal to mid LAD between the ostia of D1 and SP1, there is minimal residual thrombus present. The rest of the LAD is free of any significant disease the very tortuous Hemodynamics:  Central Aortic / Mean Pressures: 135/87 mmHg; 109 mmHg PCI: Sheath exchanged for 6 Fr  IV Heparin bolus administered to achieve ACT greater than 200 prior to wire advancement; see below Guide: 6 Fr XB LAD 3.5 Guidewire: BMW    Predilation Balloon: Emerge Monorail 3.0 mm x 15 mm; 8 Atm x 30 Sec  Stent: Promus Element DES 4.0 mm x 28 mm; 12 Atm x 30 Sec, 14 Atm x 45 Sec -- final diameter 4.1 mm Post-dilatation Balloon: [place] Quantum Apex 4.0 mm x 15 mm; 16 Atm x 45 Sec -- 3 sequential inflations from distal to proximal stent. Post-deployment cineangiography with and without guidewire in place was performed in multiple orthogonal views demonstrating excellent stent deployment and did not reveal evidence of dissection or perforation  The sheath was sutured in place, to be removed on 2500 according to the ACT.  ANESTHESIA: Local Lidocaine 18 ml  SEDATION: 3 mg IV Versed, 75 mcg IV Fentanyl;  MEDICATIONS: Continue Integrilin infusion during procedure; IV Heparin 2 separate boluses -- 5000 units, 2000 units  EBL: < 20 ml  PATIENT DISPOSITION:  The patient was stable before, during, and after the procedure.  He tolerated the procedure well without any complications.  He was transported to the PACU holding area in a chest pain-free, hemodynamically stable condition POST-OPERATIVE DIAGNOSIS:  Successful PCI of the proximal into the mid LAD with a Promus Element DES 4.0 mm x 28 mm PLAN OF CARE:  Transfer to 2500 for post PCI care.  Monitor post Integrilin CBCs. Need to monitor thrombocytopenia, may need to use Angiomax if platelets drop.  Continue dual Antiplatelet Therapy for minimum of 1 year. I have counseled the case management to assist.  Continue to adjust her medications prior to discharge. Marykay Lex, M.D., M.S.  [facility]   Assessment/Plan  Principal Problem:  *NSTEMI (non-ST elevated myocardial infarction) Active Problems:  Substance abuse  Alcohol abuse  Tobacco abuse  Bradycardia  Plan: S/P LHC and PCI to proximal into the mid LAD with a Promus Element DES 4.0 mm x 28 mm.  No beta blocker secondary to cocaine use and intermitent junctional rhythm/bradycardia.  Consider  starting amlodipine for better BP control as OP.  BP today is well controlled.  Troponin improved considerably.  Platelets starting to improve.  Starting [employer] tomorrow.     LOS: 3 days    HAGER, BRYAN 02/03/2012 9:47 AM  He did well overnight - has intermittent bradycardia in the mid to high 40s that he senses as "palpitations" -- with the QRS widening somewhat during this rhythm - it could either be idioventricular rhythm vs. Low junctional.. -- No AV Nodal Agents.  He says that he actually feels better -- no pressure in his chest, & breathing better.  Groin looks great, exam is benign.  He walked around today without difficulty.   His platelets have continued to increase.  Troponin appears to have had a "washout phenomenon.  He is ready for discharge today -- would consider Amlodipine vs ACE-I for BP once we see how his BP normalizes in OP. Will convert to Brilinta -- can get via Medicaid (can switch to Plavix after month if necessary). D/c on statin for endothelial stabilization.  ROV with JB or an extender in ~1-2 weeks. Discussed activity restrictions - ~3 wks with light activity, full activity not until 6 weeks.  Marykay Lex, M.D., M.S. [facility] [address] [place], [place]  [address]  [phone] Pager # [phone]  02/03/2012 2:07 PM

## 2012-02-03 NOTE — Discharge Summary (Signed)
Physician Discharge Summary  Patient ID: JAXTON CASALE MRN: [id number] DOB/AGE: [birth year] 49 y.o.  Admit date: 01/31/2012 Discharge date: 02/03/2012  Admission Diagnoses:  Discharge Diagnoses:  Principal Problem:  *NSTEMI (non-ST elevated myocardial infarction) Active Problems:  Substance abuse - Cocaine  Alcohol abuse  Tobacco abuse  Bradycardia  Discharged Condition: stable  Hospital Course:   The patient is a 51 yo thin AA male with a history of depression, alcohol abuse, drug abuse, Tobacco abuse since age of 40, recent cervical spine surgery. He drinks 3-4 or more 40 ounce beers per day. His family has a lot of birthdays in the month on June so he drinks more. He presented with chest pain. He described the pain as "sore". At its worst was 10/10 and during the interview was 3/10. He had associated diaphoresis. He denied radiation of pain, nausea, vomiting, palpitations, cough, congestion, abdominal pain, LEE, dysuria, hematuria.  He was admitted to step down.  I-Sat troponin was 8.59.  He was started on IV nitro and heparin.  2D echo was ordered(see results below). First set of cardiac markers showed a troponin >20.0.  He was then taken to the cath lab on Saturday for LHC.   He  had an ulcerated plaque in its proximal LAD with a large thrombus burden superimposed.  Integrillin was continued for 36 hrs and he was taken back to the cath lab on Monday when he had successful PCI of the proximal into the mid LAD with a Promus Element DES 4.0 mm x 28 mm.  UDS was positive for cocaine and marijuana.  EToh was positive.   Chest pain resolved.  Beta blocker not initiated secondary to cocaine use.  He was discharged home in stable condition after being seen by Dr. Herbie Baltimore.  Consults: None  Significant Diagnostic Studies:  [facility]  CARDIAC CATHETERIZATION  History obtained from chart review. Mr. Jeanty is a 51 year old single after the rectum male positive factors  including 80+-pack-year history of tobacco abuse and family history. He also routinely uses THC, alcohol and cocaine. He presented to the hospital with chest pain nausea vomiting. His enzymes were positive and continued to rise. His EKG showed no acute changes. Because of this he was brought to the cardiac catheterization laboratory to define his anatomy  PROCEDURE DESCRIPTION:  The patient was brought to the second floor  [facility] Cardiac cath lab in the postabsorptive state. He Was  premedicated with IV Versed and fentanyl.Marland Kitchen His right groin  was prepped and shaved in usual sterile fashion. Xylocaine 1% was used  for local anesthesia. A 6 French sheath was inserted into the the common femoral  artery using standard Seldinger technique. 6 French right and left Judkins catheters all the 6 French pigtail catheter were used for selective coronary angiography and left ventriculography respectively. Visipaque dye was used for the entirety of the case. Retrograde aorta, left ventricular pullback pressures were recorded.  HEMODYNAMICS:  AO SYSTOLIC/AO DIASTOLIC: 122/88  LV SYSTOLIC/LV DIASTOLIC: 125/14  ANGIOGRAPHIC RESULTS:  1. Left main; normal  2. LAD; ulcerative plaque of approximately 80 with a large superimposed thrombus burden  3. Left circumflex; nondominant and normal. There was a moderate size ramus branch that was normal as well..  4. Right coronary artery; dominant and normal  5. Left ventriculography; RAO left ventriculogram was performed using  25 mL of Visipaque dye at 12 mL/second. The overall LVEF estimated  50 % Without wall motion abnormalities  IMPRESSION:Mr. Shuster has an  ulcerated plaque in its proximal LAD with a large thrombus burden superimposed. I believe the safest option is to treat him with Integrilin for 36-48 hours along with Plavix and aspirin. He'll need relook angiography on Monday to determine whether the thrombus will have resolved making percutaneous intervention  safer. The sheath was removed and pressure was held on the groin to achieve hemostasis after a few as measured. The patient received 300 mg of by mouth Plavix in the cath lab Integrilin infusion was initiated. He left the lab in stable condition.  Runell Gess MD, [facility]  01/31/2012  3:35 PM   02/02/12 PRE-OPERATIVE DIAGNOSIS:  NSTEMI  Previously documented Proximal LAD Lesion. PROCEDURES PERFORMED:  Successful Percutaneous Intervention on the Proximal to Mid LAD with a Promus DES 4.0 mm x 28 mm PROCEDURE:  Consent: The procedure with Risks/Benefits/Alternatives and Indications was reviewed with the patient. All questions were answered.  Risks / Complications include, but not limited to: Death, MI, CVA/TIA, VF/VT (with defibrillation), Bradycardia (need for temporary pacer placement), contrast induced nephropathy, bleeding / bruising / hematoma / pseudoaneurysm, vascular or coronary injury (with possible emergent CT or Vascular Surgery), adverse medication reactions, infection.  The patient voiced understanding and agree to proceed.  Consent for signed by MD and patient with RN witness -- placed on chart.  Procedure: The patient was brought to the 2nd Floor [facility]iac Catheterization Lab in the fasting state and prepped and draped in the usual sterile fashion for Right groin access. Sterile technique was used including antiseptics, cap, gloves, gown, hand hygiene, mask and sheet.  Skin prep: Chlorhexidine;  Time Out: Verified patient identification, verified procedure, site/side was marked, verified correct patient position, special equipment/implants available, medications/allergies/relevent history reviewed, required imaging and test results available. Performed  Access: Right Common Femoral Artery; 5 Fr Sheath; fluoroscopically guided, modified Seldinger technique  Diagnostic Left Coronary Artery Angiography: JL 4  Revealed residual 70-80% lesion ranging from the proximal to mid LAD  between the ostia of D1 and SP1, there is minimal residual thrombus present. The rest of the LAD is free of any significant disease the very tortuous Hemodynamics:  Central Aortic / Mean Pressures: 135/87 mmHg; 109 mmHg PCI: Sheath exchanged for 6 Fr  IV Heparin bolus administered to achieve ACT greater than 200 prior to wire advancement; see below Guide: 6 Fr XB LAD 3.5 Guidewire: BMW  Predilation Balloon: Emerge Monorail 3.0 mm x 15 mm; 8 Atm x 30 Sec  Stent: Promus Element DES 4.0 mm x 28 mm; 12 Atm x 30 Sec, 14 Atm x 45 Sec -- final diameter 4.1 mm Post-dilatation Balloon: [place] Quantum Apex 4.0 mm x 15 mm; 16 Atm x 45 Sec -- 3 sequential inflations from distal to proximal stent. Post-deployment cineangiography with and without guidewire in place was performed in multiple orthogonal views demonstrating excellent stent deployment and did not reveal evidence of dissection or perforation  The sheath was sutured in place, to be removed on 2500 according to the ACT.  ANESTHESIA: Local Lidocaine 18 ml  SEDATION: 3 mg IV Versed, 75 mcg IV Fentanyl;  MEDICATIONS: Continue Integrilin infusion during procedure; IV Heparin 2 separate boluses -- 5000 units, 2000 units  EBL: < 20 ml  PATIENT DISPOSITION:  The patient was stable before, during, and after the procedure.  He tolerated the procedure well without any complications.  He was transported to the PACU holding area in a chest pain-free, hemodynamically stable condition POST-OPERATIVE DIAGNOSIS:  Successful PCI of the proximal into  the mid LAD with a Promus Element DES 4.0 mm x 28 mm PLAN OF CARE:  Transfer to 2500 for post PCI care.  Monitor post Integrilin CBCs. Need to monitor thrombocytopenia, may need to use Angiomax if platelets drop.  Continue dual Antiplatelet Therapy for minimum of 1 year. I have counseled the case management to assist.  Continue to adjust her medications prior to discharge. Marykay Lex, M.D., M.S.  [facility]  [address]  [place], [place] [address]  [phone]   [phone], 2D Echo Study Conclusions  - Left ventricle: The cavity size was normal. There was moderate to severefocal basal and moderate concentric hypertrophy of the septum. Consider hypertrophic cardiomyopathy variant if the patient does not have a history of hypertension. No increased LVOT gradient was noted. Systolic function was normal. The estimated ejection fraction was in the range of 55% to 60%. Left ventricular diastolic function parameters were normal. - Left atrium: The atrium was mildly dilated.  Lipid Panel     Component Value Date/Time   CHOL 128 02/01/2012 0540   TRIG 57 02/01/2012 0540   HDL 67 02/01/2012 0540   CHOLHDL 1.9 02/01/2012 0540   VLDL 11 02/01/2012 0540   LDLCALC 50 02/01/2012 0540    BMET    Component Value Date/Time   NA 141 02/03/2012 0425   K 4.0 02/03/2012 0425   CL 107 02/03/2012 0425   CO2 25 02/03/2012 0425   GLUCOSE 88 02/03/2012 0425   BUN 11 02/03/2012 0425   CREATININE 1.22 02/03/2012 0425   CALCIUM 8.6 02/03/2012 0425   GFRNONAA 68* 02/03/2012 0425   GFRAA 79* 02/03/2012 0425    CBC    Component Value Date/Time   WBC 8.7 02/03/2012 0425   RBC 4.54 02/03/2012 0425   HGB 14.7 02/03/2012 0425   HCT 42.0 02/03/2012 0425   PLT 135* 02/03/2012 0425   MCV 92.5 02/03/2012 0425   MCH 32.4 02/03/2012 0425   MCHC 35.0 02/03/2012 0425   RDW 13.8 02/03/2012 0425   LYMPHSABS 3.1 01/31/2012 0254   MONOABS 1.1* 01/31/2012 0254   EOSABS 0.4 01/31/2012 0254   BASOSABS 0.0 01/31/2012 0254    Treatments: Diagnostic Cardiac Catheterization on 01/31/12 --> placed on IV Integrelin & Heparin; Planned relook LHC with PCI of prox-mid LAD on 02/02/12.  Echocardiogram done 02/01/12.    Discharge Exam: Blood pressure 110/70, pulse 57, temperature 97.7 F (36.5 C), temperature source Oral, resp. rate 16, height 6\' 4"  (1.93 m), weight 80.5 kg (177 lb 7.5 oz), SpO2 99.00%. General appearance: alert,  cooperative and no distress  Lungs: clear to auscultation bilaterally  Heart: regular rate and rhythm, S1, S2 normal, no murmur, click, rub or gallop  Extremities: No LEE  Pulses: 2+ and symmetric  Neurologic: Grossly normal  Skin: Warm and dry. Small hematoma at right groin cath site. Minial ecchymosis. Nontender and no bruit.   Disposition: 01-Home or Self Care  Discharge Orders    Future Orders Please Complete By Expires   Amb Referral to Cardiac Rehabilitation      Diet - low sodium heart healthy      Increase activity slowly      Discharge instructions      Comments:   The patient had a myocardial infarction requiring stent to the LAD artery and needs to rest for the next seven days.  No driving and no lifting greater than a gallon of milk.     Medication List  As of 02/03/2012 10:20 AM   STOP taking these medications         ibuprofen 200 MG tablet         TAKE these medications         aspirin 81 MG EC tablet   Take 1 tablet (81 mg total) by mouth daily.      atorvastatin 80 MG tablet   Commonly known as: LIPITOR   Take 1 tablet (80 mg total) by mouth daily at 6 PM.      folic acid 1 MG tablet   Commonly known as: FOLVITE   Take 1 tablet (1 mg total) by mouth daily.      PARoxetine 10 MG tablet   Commonly known as: PAXIL   Take 10 mg by mouth every morning.      QUEtiapine 300 MG 24 hr tablet   Commonly known as: SEROQUEL XR   Take 300 mg by mouth at bedtime.      Ticagrelor 90 MG Tabs tablet   Commonly known as: BRILINTA   Take 1 tablet (90 mg total) by mouth 2 (two) times daily.      vitamin C 1000 MG tablet   Take 1,000 mg by mouth daily.           Follow-up Information    Follow up with [facility] R, NP on 02/13/2012. (3:15 PM)    Contact information:   [address] [place] [address] [phone]          Signed: Wilburt Finlay 02/03/2012, 10:20 AM  I saw & examined the patient the AM of discharge.  I  agree with Mr. Jasper Riling summary as noted above.    He did well overnight following his PCI.    He did have intermittent bradycardia in the mid to high 40s that he senses as "palpitations" -- with the QRS widening somewhat during this rhythm - it could either be idioventricular rhythm vs. Low junctional.. -- No AV Nodal Agents.   He says that he actually feels better -- no pressure in his chest, & breathing better.   Groin looks great, exam is benign. He walked around today without difficulty.   His platelets have continued to increase. Troponin appears to have had a "washout phenomenon.   He is ready for discharge today -- would consider Amlodipine vs ACE-I for BP once we see how his BP normalizes in OP.   Will convert to Brilinta -- can get via Medicaid (can switch to Plavix after month if necessary).   D/c on statin for endothelial stabilization.   ROV with JB or an extender in ~1-2 weeks.   Discussed activity restrictions - ~3 wks with light activity, full activity not until 6 weeks.

## 2012-02-03 NOTE — Progress Notes (Signed)
Site area: right groin  Site Prior to Removal:  Level 0  Pressure Applied For 30 minutes  Minutes Beginning at 2245  Manual:   yes  Patient Status During Pull:  WNL  Post Pull Groin Site:  Level 0  Post Pull Instructions Given:  yes  Post Pull Pulses Present:  Yes  Dressing Applied:  Yes  Comments: Pt tolerated procedure well

## 2012-02-09 LAB — POCT ACTIVATED CLOTTING TIME: Activated Clotting Time: 199 seconds

## 2012-02-21 ENCOUNTER — Encounter (HOSPITAL_COMMUNITY): Payer: Self-pay | Admitting: *Deleted

## 2012-02-21 ENCOUNTER — Other Ambulatory Visit: Payer: Self-pay

## 2012-02-21 ENCOUNTER — Emergency Department (HOSPITAL_COMMUNITY)
Admission: EM | Admit: 2012-02-21 | Discharge: 2012-02-21 | Disposition: A | Discharge status: Home / Self Care | Payer: Medicaid Other | Attending: Emergency Medicine | Admitting: Emergency Medicine

## 2012-02-21 DIAGNOSIS — IMO0002 Reserved for concepts with insufficient information to code with codable children: Secondary | ICD-10-CM

## 2012-02-21 DIAGNOSIS — S0180XA Unspecified open wound of other part of head, initial encounter: Secondary | ICD-10-CM | POA: Insufficient documentation

## 2012-02-21 DIAGNOSIS — F17200 Nicotine dependence, unspecified, uncomplicated: Secondary | ICD-10-CM | POA: Insufficient documentation

## 2012-02-21 DIAGNOSIS — F411 Generalized anxiety disorder: Secondary | ICD-10-CM | POA: Insufficient documentation

## 2012-02-21 DIAGNOSIS — F419 Anxiety disorder, unspecified: Secondary | ICD-10-CM

## 2012-02-21 DIAGNOSIS — Z981 Arthrodesis status: Secondary | ICD-10-CM | POA: Insufficient documentation

## 2012-02-21 DIAGNOSIS — M5030 Other cervical disc degeneration, unspecified cervical region: Secondary | ICD-10-CM | POA: Insufficient documentation

## 2012-02-21 MED ORDER — TICAGRELOR 90 MG PO TABS
90.0000 mg | ORAL_TABLET | Freq: Once | ORAL | Status: AC
  Administered 2012-02-21: 90 mg via ORAL
  Filled 2012-02-21: qty 1

## 2012-02-21 NOTE — ED Notes (Signed)
Pt ambulated with a steady gait; VSS; A&Ox3; no signs of distress; respirations even and unlabored; skin warm and dry. No questions
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
## 2012-02-21 NOTE — ED Provider Notes (Signed)
Medical screening examination/treatment/procedure(s) were performed by non-physician practitioner and as supervising physician I was immediately available for consultation/collaboration.   Leanda Padmore, MD 02/21/12 0600 
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
## 2012-07-23 LAB — CBC WITH DIFFERENTIAL/PLATELET
Basophil %: 0.8 %
Eosinophil %: 4.9 %
HCT: 39.9 % — ABNORMAL LOW (ref 40.0–52.0)
HGB: 13.3 g/dL (ref 13.0–18.0)
Lymphocyte #: 2 10*3/uL (ref 1.0–3.6)
MCH: 31.4 pg (ref 26.0–34.0)
MCV: 94 fL (ref 80–100)
Monocyte #: 0.4 x10 3/mm (ref 0.2–1.0)
Neutrophil #: 3.2 10*3/uL (ref 1.4–6.5)
RBC: 4.23 10*6/uL — ABNORMAL LOW (ref 4.40–5.90)
RDW: 13 % (ref 11.5–14.5)

## 2012-07-23 LAB — COMPREHENSIVE METABOLIC PANEL
Alkaline Phosphatase: 67 U/L (ref 50–136)
BUN: 20 mg/dL — ABNORMAL HIGH (ref 7–18)
Bilirubin,Total: 0.3 mg/dL (ref 0.2–1.0)
Chloride: 107 mmol/L (ref 98–107)
Creatinine: 1.06 mg/dL (ref 0.60–1.30)
EGFR (African American): >60
EGFR (Non-African Amer.): >60
Glucose: 73 mg/dL (ref 65–99)
Osmolality: 282 (ref 275–301)
Potassium: 4.3 mmol/L (ref 3.5–5.1)
SGOT(AST): 24 U/L (ref 15–37)
Sodium: 141 mmol/L (ref 136–145)
Total Protein: 6.5 g/dL (ref 6.4–8.2)

## 2012-07-23 LAB — CK TOTAL AND CKMB (NOT AT ARMC): CK, Total: 118 U/L (ref 35–232)

## 2012-07-24 ENCOUNTER — Observation Stay: Payer: Self-pay | Admitting: Internal Medicine

## 2012-07-24 DIAGNOSIS — R079 Chest pain, unspecified: Secondary | ICD-10-CM

## 2012-07-24 LAB — URINALYSIS, COMPLETE
Nitrite: NEGATIVE
Protein: NEGATIVE
RBC,UR: 8 /HPF (ref 0–5)
WBC UR: 9 /HPF (ref 0–5)

## 2012-07-24 LAB — CK TOTAL AND CKMB (NOT AT ARMC)
CK, Total: 122 U/L (ref 35–232)
CK-MB: 1.9 ng/mL (ref 0.5–3.6)
CK-MB: 1.8 ng/mL (ref 0.5–3.6)

## 2012-07-25 DIAGNOSIS — R079 Chest pain, unspecified: Secondary | ICD-10-CM

## 2012-07-25 LAB — LIPID PANEL
Cholesterol: 110 mg/dL (ref 0–200)
Ldl Cholesterol, Calc: 48 mg/dL (ref 0–100)

## 2012-12-06 ENCOUNTER — Encounter: Payer: Self-pay | Admitting: Cardiology

## 2012-12-08 ENCOUNTER — Telehealth: Payer: Self-pay | Admitting: Cardiology

## 2012-12-08 NOTE — Telephone Encounter (Signed)
Patient was called to see if he would r/s to see a PA the same day. Pt VM is full and I could not leave a message.

## 2012-12-13 ENCOUNTER — Encounter: Payer: Self-pay | Admitting: Cardiology

## 2012-12-14 ENCOUNTER — Encounter: Payer: Self-pay | Admitting: Cardiology

## 2012-12-14 ENCOUNTER — Ambulatory Visit (INDEPENDENT_AMBULATORY_CARE_PROVIDER_SITE_OTHER): Payer: Medicaid Other | Admitting: Cardiology

## 2012-12-14 VITALS — BP 100/60 | HR 49 | Ht 76.0 in | Wt 181.5 lb

## 2012-12-14 DIAGNOSIS — I200 Unstable angina: Secondary | ICD-10-CM

## 2012-12-14 DIAGNOSIS — F17200 Nicotine dependence, unspecified, uncomplicated: Secondary | ICD-10-CM

## 2012-12-14 DIAGNOSIS — Z79899 Other long term (current) drug therapy: Secondary | ICD-10-CM

## 2012-12-14 DIAGNOSIS — D689 Coagulation defect, unspecified: Secondary | ICD-10-CM

## 2012-12-14 DIAGNOSIS — Z01818 Encounter for other preprocedural examination: Secondary | ICD-10-CM

## 2012-12-14 DIAGNOSIS — R079 Chest pain, unspecified: Secondary | ICD-10-CM

## 2012-12-14 DIAGNOSIS — R001 Bradycardia, unspecified: Secondary | ICD-10-CM

## 2012-12-14 DIAGNOSIS — Z9861 Coronary angioplasty status: Secondary | ICD-10-CM | POA: Insufficient documentation

## 2012-12-14 DIAGNOSIS — Z720 Tobacco use: Secondary | ICD-10-CM

## 2012-12-14 DIAGNOSIS — I498 Other specified cardiac arrhythmias: Secondary | ICD-10-CM

## 2012-12-14 DIAGNOSIS — I2510 Atherosclerotic heart disease of native coronary artery without angina pectoris: Secondary | ICD-10-CM

## 2012-12-14 DIAGNOSIS — E785 Hyperlipidemia, unspecified: Secondary | ICD-10-CM

## 2012-12-14 DIAGNOSIS — F1910 Other psychoactive substance abuse, uncomplicated: Secondary | ICD-10-CM

## 2012-12-14 LAB — PROTIME-INR
INR: 0.92 (ref <1.50)
Prothrombin Time: 12.4 seconds (ref 11.6–15.2)

## 2012-12-14 LAB — CBC
HCT: 45.1 % (ref 39.0–52.0)
MCV: 93.6 fL (ref 78.0–100.0)
Platelets: 276 10*3/uL (ref 150–400)
RBC: 4.82 MIL/uL (ref 4.22–5.81)
WBC: 8.3 10*3/uL (ref 4.0–10.5)

## 2012-12-14 MED ORDER — ATORVASTATIN CALCIUM 80 MG PO TABS: 40.0000 mg | ORAL_TABLET | Freq: Every day | ORAL | Status: DC

## 2012-12-14 MED ORDER — ISOSORBIDE MONONITRATE ER 30 MG PO TB24: 30.0000 mg | ORAL_TABLET | Freq: Every day | ORAL | Status: DC

## 2012-12-14 MED ORDER — LISINOPRIL 5 MG PO TABS: 2.5000 mg | ORAL_TABLET | Freq: Every day | ORAL | Status: DC

## 2012-12-14 NOTE — Assessment & Plan Note (Signed)
He needs relatively urgent cardiac catheterization to confirm stent patency.  High likelihood of ISR/thrombosis. Add Imdur No BP room for BB as yet -- actually reducing ACE-I dose to 2.5 mg.

## 2012-12-14 NOTE — Assessment & Plan Note (Addendum)
Need to continue Brilinta + ASA. Plan Cardiac Cath. Need Echo Report from 07/2012 @ [facility]

## 2012-12-14 NOTE — Progress Notes (Signed)
[facility]  Patient ID: Timothy Peck, male   DOB: 03/15/1961, 51 y.o.   MRN: [id number]  Clinic Note: HPI: Timothy Peck is a 52 y.o. male history denies solution MI back in June 2013. He was found to have a hazy ulcerated plaque in the proximal LAD. This was treated with overnight Integrilin, but was persistent on relook catheterization. I then treated the lesion the proximal LAD with a Promus element DES 4.0 mm 28 mm. Unfortunately I am not really certain how much the followup since his discharge. Apparently he was incarcerated at the time of his with an intended followup visit with me. I did see him in August at which time he was doing pretty well insistent on seeing him back.  Today he comes in quite late for his appointment reportedly been having intermittent chest pain with exertion as well as shortness of breath with exertion since November of last year. He notes that this was after a several month period with no active symptoms. His symptoms began when he was incarcerated. He says that for several weeks he didn't get his Prasugrel.  He was then switched to Plavix, was not receiving aspirin. He was not receiving a statin or his ACE inhibitor. Next  He was taken to [facility] where he was evaluated for shortness of breath with exertion and chest pain and said he had ultrasound of his heart. All he knows was that he was told that the stent was placed was "messed up". He would not give any more details. This information was not forwarded to me. Since then he's had persistently worsening shortness of breath exertion. He also gets chest tightness and pressure with exertion. Now the point where he asked to have some chest pains at rest with not doing anything. He has 2 different types of pain one is aching type pain when he does certain movements but the other is a deeper heavier pain which he relates to being similar to his non-ST elevation type pain. He also  notes having PND and orthopnea. He denies any syncope or near-syncope but does note occasional dizziness but is not necessary prepositional. He actually at some chest pain and shortness of breath will walking into the office today.   Allergies  Allergen Reactions  . Ibuprofen Other (See Comments)    Doctor told him he could not take    Current Outpatient Prescriptions  Medication Sig Dispense Refill  . folic acid (FOLVITE) 1 MG tablet Take 1 tablet (1 mg total) by mouth daily.  30 tablet  5  . lisinopril (PRINIVIL,ZESTRIL) 5 MG tablet Take 0.5 tablets (2.5 mg total) by mouth daily.      . nitroGLYCERIN (NITROSTAT) 0.4 MG SL tablet Place 0.4 mg under the tongue every 5 (five) minutes as needed for chest pain.      . Ticagrelor (BRILINTA) 90 MG TABS tablet Take 1 tablet (90 mg total) by mouth 2 (two) times daily.  60 tablet  10  . Ascorbic Acid (VITAMIN C) 1000 MG tablet Take 1,000 mg by mouth daily.      . aspirin EC 81 MG EC tablet Take 1 tablet (81 mg total) by mouth daily.      . atorvastatin (LIPITOR) 80 MG tablet Take 0.5 tablets (40 mg total) by mouth daily at 6 PM.  90 tablet  5  . isosorbide mononitrate (IMDUR) 30 MG 24 hr tablet Take 1 tablet (30 mg total) by mouth daily.    90 tablet  3  . PARoxetine (PAXIL) 10 MG tablet Take 10 mg by mouth every morning.      . QUEtiapine (SEROQUEL XR) 300 MG 24 hr tablet Take 300 mg by mouth at bedtime.       No current facility-administered medications for this visit.    Past Medical History  Diagnosis Date  . Depression   . Degenerative disc disease, cervical     Dx years ago  . CAD (coronary artery disease) 02/2012    NSTEMI - PCI to prox LAD  . Presence of drug coated stent in LAD coronary artery 02/2012    Promus Element 4.0 mm x 28 mm  . MI (myocardial infarction) 02/02/2012    Normal 2D Echo - EF-60    Past Surgical History  Procedure Laterality Date  . Tumor removal      left foot third toe  . Anterior cervical  decomp/discectomy fusion  11/28/2011    Procedure: ANTERIOR CERVICAL DECOMPRESSION/DISCECTOMY FUSION 1 LEVEL;  Surgeon: Henry A Pool, MD;  Location: [facility];  Service: Neurosurgery;  Laterality: N/A;  Anterior Cervical Six-Seven Decompression and Fusion  . Cardiac catheterization  01/31/2012    Promeus Element DES 4 x 28, balloon-Emerge monorail    ROS: Review of Systems - Negative except below & HPI Respiratory ROS: positive for - orthopnea and shortness of breath negative for - cough, hemoptysis, sputum changes or wheezing Cardiovascular ROS: positive for - chest pain, dyspnea on exertion, irregular heartbeat, orthopnea, palpitations, paroxysmal nocturnal dyspnea and shortness of breath negative for - edema, loss of consciousness or murmur Gastrointestinal ROS: no abdominal pain, change in bowel habits, or black or bloody stools negative for - blood in stools, heartburn, hematemesis, melena or nausea/vomiting Musculoskeletal ROS: positive for - pain in bilateral chest wall   PHYSICAL EXAM BP 100/60  Pulse 49  Ht 6' 4" (1.93 m)  Wt 181 lb 8 oz (82.328 kg)  BMI 22.1 kg/m2 General appearance: alert, cooperative, appears stated age, no distress and pleasant mood & affect; very anxious Neck: no adenopathy, no carotid bruit, no JVD, supple, symmetrical, trachea midline and thyroid not enlarged, symmetric, no tenderness/mass/nodules Lungs: clear to auscultation bilaterally, normal percussion bilaterally and non-labored Heart: regular rate and rhythm, S1, S2 normal, no murmur, click, rub or gallop and Bradycardic Abdomen: soft, non-tender; bowel sounds normal; no masses,  no organomegaly Extremities: extremities normal, atraumatic, no cyanosis or edema Pulses: 2+ and symmetric Neurologic: Alert and oriented X 3, normal strength and tone. Normal symmetric reflexes. Normal coordination and gait Palpation of bilateral chest wall reporduces & exacerbates painl but he states that this is not  what he feels with exertion.  EKG:Rate:49 , Rhythm: Sinus Bradycardia;  Conduction Delay non; AV Block: none; J-point elevation as previously noted in anterior leads  ASSESSMENT AND PLAN: Patient Active Problem List   Diagnosis Date Noted  . Unstable angina pectoris - angina now intermittently at rest; previously with exertion only 12/14/2012    Priority: High  . CAD (coronary artery disease), native coronary artery 12/14/2012    Priority: Medium  . Dyslipidemia, goal LDL below 70 12/14/2012    Priority: Medium  . NSTEMI (non-ST elevated myocardial infarction) 01/31/2012    Priority: Low  . Bradycardia 02/03/2012  . Substance abuse 01/31/2012  . Alcohol abuse 01/31/2012  . Tobacco abuse 01/31/2012  . Herniation of cervical intervertebral disc with radiculopathy 11/28/2011   I am quite concerned that in this gentleman with a relatively long drug-eluting   stent in his LAD having exertional chest pain or shortness of breath for several months may very well be indicative of in-stent restenosis or thrombosis. Especially since he's not been on standing dual antiplatelet therapy during this time. At this point I think the most prudent course of action is to proceed with cardiac catheterization. I may be the result of the echocardiogram from [facility]. I am also quite upset at the fact that he had a cardiac evaluation was not forwarded to me while he was incarcerated. He should see a cardiologist if there was concerns.  Orders Placed This Encounter  Procedures  . COMPLETE METABOLIC PANEL WITH GFR  . CBC  . PTT  . INR/PT  . TSH  . EKG 12-Lead  . LEFT HEART CATH    Standing Status: Future     Number of Occurrences:      Standing Expiration Date: 12/20/2012    Scheduling Instructions:     Right radial access. Will do labs 12/14/2012    HARDING,DAVID W, M.D., M.S. [facility] [address] [place], [place]  [address]  [phone] Pager #  [phone] 12/14/2012 1:13 PM    

## 2012-12-14 NOTE — Assessment & Plan Note (Addendum)
He has been "sober" since prior to incarceration. He was congratulated.

## 2012-12-14 NOTE — Patient Instructions (Addendum)
Scheduling  Cardiac Cath for this week.Please do lab work today.   Concern for issues with your stent causing your shortness of breath and chest pain. Some of the chest pain is reproducible, & therefore may be musculoskeletal.  Your physician has requested that you have a cardiac catheterization. Cardiac catheterization is used to diagnose and/or treat various heart conditions. Doctors may recommend this procedure for a number of different reasons. The most common reason is to evaluate chest pain. Chest pain can be a symptom of coronary artery disease (CAD), and cardiac catheterization can show whether plaque is narrowing or blocking your heart's arteries. This procedure is also used to evaluate the valves, as well as measure the blood flow and oxygen levels in different parts of your heart. For further information please visit [email or web address] Please follow instruction sheet, as given. If an "interventio"n needs to be done, you may need to spend the night, if not, you will be discharged that day.  Your doctor would like for you to: Reduce Lisinopril dose to 1/2 tab daily. Add a long acting Nitroglycerin medicine (IMDUR) to help with your chest pain.  You will follow up in the office following your procedure.

## 2012-12-14 NOTE — Assessment & Plan Note (Signed)
No Beta Blocker.

## 2012-12-14 NOTE — Assessment & Plan Note (Signed)
Restart Statin -- f/u labs in 3 months

## 2012-12-14 NOTE — Assessment & Plan Note (Signed)
3-5 minutes of smoking cessation counseling given.   Down to 5 cigarettes daily.

## 2012-12-15 ENCOUNTER — Other Ambulatory Visit: Payer: Self-pay | Admitting: *Deleted

## 2012-12-15 ENCOUNTER — Encounter (HOSPITAL_COMMUNITY): Payer: Self-pay | Admitting: Pharmacy Technician

## 2012-12-15 DIAGNOSIS — Z01810 Encounter for preprocedural cardiovascular examination: Secondary | ICD-10-CM

## 2012-12-15 LAB — COMPLETE METABOLIC PANEL WITH GFR
AST: 34 U/L (ref 0–37)
Alkaline Phosphatase: 47 U/L (ref 39–117)
BUN: 16 mg/dL (ref 6–23)
Creat: 1.32 mg/dL (ref 0.50–1.35)
Total Bilirubin: 0.3 mg/dL (ref 0.3–1.2)

## 2012-12-17 ENCOUNTER — Encounter (HOSPITAL_COMMUNITY)
Admission: RE | Disposition: A | Discharge status: Home / Self Care | Payer: Self-pay | Source: Ambulatory Visit | Attending: Cardiology

## 2012-12-17 ENCOUNTER — Ambulatory Visit (HOSPITAL_COMMUNITY)
Admission: RE | Admit: 2012-12-17 | Discharge: 2012-12-17 | Disposition: A | Discharge status: Home / Self Care | Payer: Medicaid Other | Source: Ambulatory Visit | Attending: Cardiology | Admitting: Cardiology

## 2012-12-17 ENCOUNTER — Encounter (HOSPITAL_COMMUNITY): Admission: RE | Payer: Self-pay | Source: Ambulatory Visit

## 2012-12-17 ENCOUNTER — Ambulatory Visit (HOSPITAL_COMMUNITY): Admission: RE | Admit: 2012-12-17 | Payer: Medicaid Other | Source: Ambulatory Visit | Admitting: Cardiology

## 2012-12-17 DIAGNOSIS — Z886 Allergy status to analgesic agent status: Secondary | ICD-10-CM | POA: Insufficient documentation

## 2012-12-17 DIAGNOSIS — I252 Old myocardial infarction: Secondary | ICD-10-CM | POA: Insufficient documentation

## 2012-12-17 DIAGNOSIS — I2510 Atherosclerotic heart disease of native coronary artery without angina pectoris: Secondary | ICD-10-CM | POA: Insufficient documentation

## 2012-12-17 DIAGNOSIS — Z7982 Long term (current) use of aspirin: Secondary | ICD-10-CM | POA: Insufficient documentation

## 2012-12-17 DIAGNOSIS — R0602 Shortness of breath: Secondary | ICD-10-CM | POA: Insufficient documentation

## 2012-12-17 DIAGNOSIS — R0789 Other chest pain: Secondary | ICD-10-CM | POA: Insufficient documentation

## 2012-12-17 DIAGNOSIS — Z01810 Encounter for preprocedural cardiovascular examination: Secondary | ICD-10-CM

## 2012-12-17 DIAGNOSIS — Z9861 Coronary angioplasty status: Secondary | ICD-10-CM | POA: Insufficient documentation

## 2012-12-17 DIAGNOSIS — Z79899 Other long term (current) drug therapy: Secondary | ICD-10-CM | POA: Insufficient documentation

## 2012-12-17 DIAGNOSIS — F329 Major depressive disorder, single episode, unspecified: Secondary | ICD-10-CM | POA: Insufficient documentation

## 2012-12-17 DIAGNOSIS — E785 Hyperlipidemia, unspecified: Secondary | ICD-10-CM | POA: Insufficient documentation

## 2012-12-17 DIAGNOSIS — M5030 Other cervical disc degeneration, unspecified cervical region: Secondary | ICD-10-CM | POA: Insufficient documentation

## 2012-12-17 DIAGNOSIS — F3289 Other specified depressive episodes: Secondary | ICD-10-CM | POA: Insufficient documentation

## 2012-12-17 HISTORY — PX: LEFT HEART CATHETERIZATION WITH CORONARY ANGIOGRAM: SHX5451

## 2012-12-17 LAB — LIPID PANEL
LDL Cholesterol: 33 mg/dL (ref 0–99)
VLDL: 10 mg/dL (ref 0–40)

## 2012-12-17 SURGERY — LEFT HEART CATHETERIZATION WITH CORONARY ANGIOGRAM
Anesthesia: LOCAL

## 2012-12-17 MED ORDER — NITROGLYCERIN 1 MG/10 ML FOR IR/CATH LAB
INTRA_ARTERIAL | Status: AC
  Filled 2012-12-17: qty 10

## 2012-12-17 MED ORDER — TICAGRELOR 90 MG PO TABS
ORAL_TABLET | ORAL | Status: AC
  Filled 2012-12-17: qty 1

## 2012-12-17 MED ORDER — ALBUTEROL SULFATE HFA 108 (90 BASE) MCG/ACT IN AERS: 2.0000 | INHALATION_SPRAY | RESPIRATORY_TRACT | Status: DC | PRN

## 2012-12-17 MED ORDER — ACETAMINOPHEN 325 MG PO TABS: 650.0000 mg | ORAL_TABLET | ORAL | Status: DC | PRN

## 2012-12-17 MED ORDER — HEPARIN (PORCINE) IN NACL 2-0.9 UNIT/ML-% IJ SOLN
INTRAMUSCULAR | Status: AC
  Filled 2012-12-17: qty 1000

## 2012-12-17 MED ORDER — DIAZEPAM 5 MG PO TABS: 5.0000 mg | ORAL_TABLET | ORAL | Status: AC

## 2012-12-17 MED ORDER — ATORVASTATIN CALCIUM 40 MG PO TABS
40.0000 mg | ORAL_TABLET | Freq: Every day | ORAL | Status: DC
  Administered 2012-12-17: 40 mg via ORAL
  Filled 2012-12-17: qty 1

## 2012-12-17 MED ORDER — HEPARIN SODIUM (PORCINE) 1000 UNIT/ML IJ SOLN
INTRAMUSCULAR | Status: AC
  Filled 2012-12-17: qty 1

## 2012-12-17 MED ORDER — VERAPAMIL HCL 2.5 MG/ML IV SOLN
INTRAVENOUS | Status: AC
  Filled 2012-12-17: qty 2

## 2012-12-17 MED ORDER — MIDAZOLAM HCL 2 MG/2ML IJ SOLN
INTRAMUSCULAR | Status: AC
  Filled 2012-12-17: qty 2

## 2012-12-17 MED ORDER — LIDOCAINE HCL (PF) 1 % IJ SOLN
INTRAMUSCULAR | Status: AC
  Filled 2012-12-17: qty 30

## 2012-12-17 MED ORDER — ASPIRIN 81 MG PO CHEW
CHEWABLE_TABLET | ORAL | Status: AC
  Filled 2012-12-17: qty 1

## 2012-12-17 MED ORDER — SODIUM CHLORIDE 0.9 % IV SOLN: 1.0000 mL/kg/h | INTRAVENOUS | Status: DC

## 2012-12-17 MED ORDER — NITROGLYCERIN 0.4 MG SL SUBL: 0.4000 mg | SUBLINGUAL_TABLET | SUBLINGUAL | Status: DC | PRN

## 2012-12-17 MED ORDER — ONDANSETRON HCL 4 MG/2ML IJ SOLN: 4.0000 mg | Freq: Four times a day (QID) | INTRAMUSCULAR | Status: DC | PRN

## 2012-12-17 MED ORDER — SODIUM CHLORIDE 0.9 % IV SOLN
INTRAVENOUS | Status: DC
  Administered 2012-12-17: 09:00:00 via INTRAVENOUS

## 2012-12-17 MED ORDER — DIAZEPAM 5 MG PO TABS
ORAL_TABLET | ORAL | Status: AC
  Administered 2012-12-17: 5 mg via ORAL
  Filled 2012-12-17: qty 1

## 2012-12-17 MED ORDER — SODIUM CHLORIDE 0.9 % IJ SOLN: 3.0000 mL | Freq: Two times a day (BID) | INTRAMUSCULAR | Status: DC

## 2012-12-17 MED ORDER — FENTANYL CITRATE 0.05 MG/ML IJ SOLN
INTRAMUSCULAR | Status: AC
  Filled 2012-12-17: qty 2

## 2012-12-17 NOTE — Interval H&P Note (Signed)
History and Physical Interval Note:  12/17/2012 8:42 AM  [name]  has presented today for surgery, with the diagnosis of Crescendo (Unstable Angina). The various methods of treatment have been discussed with the patient and family. After consideration of risks, benefits and other options for treatment, the patient has consented to  Procedure(s): LEFT HEART CATHETERIZATION WITH CORONARY ANGIOGRAM (N/A) with possible PERCUTANEOUS CORNARY INTERVENTION as a surgical intervention .    The patient's history has been reviewed, patient examined, no change in status, stable for surgery.  I have reviewed the patient's chart and labs.  Questions were answered to the patient's satisfaction.     HARDING,DAVID W

## 2012-12-17 NOTE — H&P (View-Only) (Signed)
[facility]  Patient ID: Timothy Peck, male   DOB: Mar 30, 1961, 52 y.o.   MRN: [id number]  Clinic Note: HPI: Timothy Peck is a 52 y.o. male history denies solution MI back in June 2013. He was found to have a hazy ulcerated plaque in the proximal LAD. This was treated with overnight Integrilin, but was persistent on relook catheterization. I then treated the lesion the proximal LAD with a Promus element DES 4.0 mm 28 mm. Unfortunately I am not really certain how much the followup since his discharge. Apparently he was incarcerated at the time of his with an intended followup visit with me. I did see him in August at which time he was doing pretty well insistent on seeing him back.  Today he comes in quite late for his appointment reportedly been having intermittent chest pain with exertion as well as shortness of breath with exertion since November of last year. He notes that this was after a several month period with no active symptoms. His symptoms began when he was incarcerated. He says that for several weeks he didn't get his Prasugrel.  He was then switched to Plavix, was not receiving aspirin. He was not receiving a statin or his ACE inhibitor. Next  He was taken to [facility] where he was evaluated for shortness of breath with exertion and chest pain and said he had ultrasound of his heart. All he knows was that he was told that the stent was placed was "messed up". He would not give any more details. This information was not forwarded to me. Since then he's had persistently worsening shortness of breath exertion. He also gets chest tightness and pressure with exertion. Now the point where he asked to have some chest pains at rest with not doing anything. He has 2 different types of pain one is aching type pain when he does certain movements but the other is a deeper heavier pain which he relates to being similar to his non-ST elevation type pain. He also  notes having PND and orthopnea. He denies any syncope or near-syncope but does note occasional dizziness but is not necessary prepositional. He actually at some chest pain and shortness of breath will walking into the office today.   Allergies  Allergen Reactions  . Ibuprofen Other (See Comments)    Doctor told him he could not take    Current Outpatient Prescriptions  Medication Sig Dispense Refill  . folic acid (FOLVITE) 1 MG tablet Take 1 tablet (1 mg total) by mouth daily.  30 tablet  5  . lisinopril (PRINIVIL,ZESTRIL) 5 MG tablet Take 0.5 tablets (2.5 mg total) by mouth daily.      . nitroGLYCERIN (NITROSTAT) 0.4 MG SL tablet Place 0.4 mg under the tongue every 5 (five) minutes as needed for chest pain.      . Ticagrelor (BRILINTA) 90 MG TABS tablet Take 1 tablet (90 mg total) by mouth 2 (two) times daily.  60 tablet  10  . Ascorbic Acid (VITAMIN C) 1000 MG tablet Take 1,000 mg by mouth daily.      Marland Kitchen aspirin EC 81 MG EC tablet Take 1 tablet (81 mg total) by mouth daily.      Marland Kitchen atorvastatin (LIPITOR) 80 MG tablet Take 0.5 tablets (40 mg total) by mouth daily at 6 PM.  90 tablet  5  . isosorbide mononitrate (IMDUR) 30 MG 24 hr tablet Take 1 tablet (30 mg total) by mouth daily.  90 tablet  3  . PARoxetine (PAXIL) 10 MG tablet Take 10 mg by mouth every morning.      Marland Kitchen QUEtiapine (SEROQUEL XR) 300 MG 24 hr tablet Take 300 mg by mouth at bedtime.       No current facility-administered medications for this visit.    Past Medical History  Diagnosis Date  . Depression   . Degenerative disc disease, cervical     Dx years ago  . CAD (coronary artery disease) 02/2012    NSTEMI - PCI to prox LAD  . Presence of drug coated stent in LAD coronary artery 02/2012    Promus Element 4.0 mm x 28 mm  . MI (myocardial infarction) 02/02/2012    Normal 2D Echo - EF-60    Past Surgical History  Procedure Laterality Date  . Tumor removal      left foot third toe  . Anterior cervical  decomp/discectomy fusion  11/28/2011    Procedure: ANTERIOR CERVICAL DECOMPRESSION/DISCECTOMY FUSION 1 LEVEL;  Surgeon: Temple Pacini, MD;  Location: [facility];  Service: Neurosurgery;  Laterality: N/A;  Anterior Cervical Six-Seven Decompression and Fusion  . Cardiac catheterization  01/31/2012    Promeus Element DES 4 x 28, balloon-Emerge monorail    ROS: Review of Systems - Negative except below & HPI Respiratory ROS: positive for - orthopnea and shortness of breath negative for - cough, hemoptysis, sputum changes or wheezing Cardiovascular ROS: positive for - chest pain, dyspnea on exertion, irregular heartbeat, orthopnea, palpitations, paroxysmal nocturnal dyspnea and shortness of breath negative for - edema, loss of consciousness or murmur Gastrointestinal ROS: no abdominal pain, change in bowel habits, or black or bloody stools negative for - blood in stools, heartburn, hematemesis, melena or nausea/vomiting Musculoskeletal ROS: positive for - pain in bilateral chest wall   PHYSICAL EXAM BP 100/60  Pulse 49  Ht 6\' 4"  (1.93 m)  Wt 181 lb 8 oz (82.328 kg)  BMI 22.1 kg/m2 General appearance: alert, cooperative, appears stated age, no distress and pleasant mood & affect; very anxious Neck: no adenopathy, no carotid bruit, no JVD, supple, symmetrical, trachea midline and thyroid not enlarged, symmetric, no tenderness/mass/nodules Lungs: clear to auscultation bilaterally, normal percussion bilaterally and non-labored Heart: regular rate and rhythm, S1, S2 normal, no murmur, click, rub or gallop and Bradycardic Abdomen: soft, non-tender; bowel sounds normal; no masses,  no organomegaly Extremities: extremities normal, atraumatic, no cyanosis or edema Pulses: 2+ and symmetric Neurologic: Alert and oriented X 3, normal strength and tone. Normal symmetric reflexes. Normal coordination and gait Palpation of bilateral chest wall reporduces & exacerbates painl but he states that this is not  what he feels with exertion.  NWG:NFAO:13 , Rhythm: Sinus Bradycardia;  Conduction Delay non; AV Block: none; J-point elevation as previously noted in anterior leads  ASSESSMENT AND PLAN: Patient Active Problem List   Diagnosis Date Noted  . Unstable angina pectoris - angina now intermittently at rest; previously with exertion only 12/14/2012    Priority: High  . CAD (coronary artery disease), native coronary artery 12/14/2012    Priority: Medium  . Dyslipidemia, goal LDL below 70 12/14/2012    Priority: Medium  . NSTEMI (non-ST elevated myocardial infarction) 01/31/2012    Priority: Low  . Bradycardia 02/03/2012  . Substance abuse 01/31/2012  . Alcohol abuse 01/31/2012  . Tobacco abuse 01/31/2012  . Herniation of cervical intervertebral disc with radiculopathy 11/28/2011   I am quite concerned that in this gentleman with a relatively long drug-eluting  stent in his LAD having exertional chest pain or shortness of breath for several months may very well be indicative of in-stent restenosis or thrombosis. Especially since he's not been on standing dual antiplatelet therapy during this time. At this point I think the most prudent course of action is to proceed with cardiac catheterization. I may be the result of the echocardiogram from [facility]. I am also quite upset at the fact that he had a cardiac evaluation was not forwarded to me while he was incarcerated. He should see a cardiologist if there was concerns.  Orders Placed This Encounter  Procedures  . COMPLETE METABOLIC PANEL WITH GFR  . CBC  . PTT  . INR/PT  . TSH  . EKG 12-Lead  . LEFT HEART CATH    Standing Status: Future     Number of Occurrences:      Standing Expiration Date: 12/20/2012    Scheduling Instructions:     Right radial access. Will do labs 12/14/2012    Marykay Lex, M.D., M.S. [facility] [address] [place], [place]  [address]  [phone] Pager #  [phone] 12/14/2012 1:13 PM

## 2012-12-17 NOTE — CV Procedure (Signed)
[facility] CARDIAC CATHETERIZATION REPORT  NAME:  Timothy Peck   MRN: [id number] DOB:  August 06, 1960   ADMIT DATE: 12/17/2012 Procedure Date: 12/17/2012  INTERVENTIONAL CARDIOLOGIST: Marykay Lex, M.D., MS PRIMARY CARE PROVIDER: Pcp Not In System PRIMARY CARDIOLOGIST: Marykay Lex, MD, MS   PATIENT:  Timothy Peck is a 52 y.o. male with a PMH of NSTEMI in 01/2012, found to have an ulcerated ~80% plaque in the early mid LAD -- treated with a Promus Element DES 4.0 mm x 28 mm.  He was doing well until late November 2013.  Since then he has been having progressively worsening episodes of Chest Pain that sounds similar to his NSTEMI angina.  He supposedly had some sort of evaluation while incarcerated and was told that there must be a problem with his stent.  Since then, his symptoms have worsened.  I finally saw him this week in clinic, and come to find that he has not been on DAPT (was not on ASA while incarcerated & never restarted, had been on neither ASA or Brilinta/Plavix for ~1 week).  I was concerned that his symptoms could be related to In-stent restenosis vs. Thrombosis, so he was referred for cardiac catheterization.   PRE-OPERATIVE DIAGNOSIS:    Chest Pain -- concerning for Crescendo Unstable Angina.  Known CAD with DES in proximal to mid LAD  PROCEDURES PERFORMED:    LEFT HEART CATHETERIZATION WITH NATIVE CORONARY ANGIOGRAPHY  PROCEDURE:Consent:  Risks of procedure as well as the alternatives and risks of each were explained to the (patient/caregiver).  Consent for procedure obtained. Consent for signed by MD and patient with RN witness -- placed on chart.   PROCEDURE: The patient was brought to the 2nd Floor [facility] in the fasting state and prepped and draped in the usual sterile fashion for Right groin or radial access. A modified Allen's test with plethysmography was performed, revealing excellent Ulnar artery collateral  flow.  Sterile technique was used including antiseptics, cap, gloves, gown, hand hygiene, mask and sheet.  Skin prep: Chlorhexidine.  Time Out: Verified patient identification, verified procedure, site/side was marked, verified correct patient position, special equipment/implants available, medications/allergies/relevent history reviewed, required imaging and test results available.  Performed  Access: Right Radial Artery; 5 Fr Sheath -- Seldinger technique using an Angiocath Micropuncture Kit.  IA Radial Cocktail, IV Heparin Diagnostic:  5 Fr TIG 4.0 advanced over a Versicore wire into the ascending aorta.  Left & Right Coronary Artery Angiography: TIG 4.0  LV Hemodynamics (LV Gram): Angled Pigtail (exchanged over long exchange safety J-wire)  TR Band:  1117 Hours, 12 mL air; non-occlusive hemostasis  MEDICATIONS:  Anesthesia:  Local Lidocaine 2 ml  Sedation:  2 mg IV Versed, 50 mcg IV fentanyl ;   Premedication: 5 mg PO Valium  Omnipaque Contrast: 70 ml  Anticoagulation:  IV Heparin 4000 Units   Anti-Platelet Agent:  Brilinta 90 mg, ASA 81 mg  Hemodynamics:  Central Aortic / Mean Pressures: 108/70 mmHg; 88 mmHg  Left Ventricular Pressures / EDP: 104/7 mmHg; 11 mmHg  Left Ventriculography:  EF: 55 %  Wall Motion: normal  Coronary Anatomy:  Left Main: large caliber, trifurcates into LAD, Circumflex & Ramus Intermedius. LAD: Large caliber vessel with widely patent DES stent and brisk TIMI 3 flow throughout the remainder of the vessel.  There are 2 small caliber D1 & D2 branches that are free of disease.  The jailed SP1 has a residual 60-70% ostial  stenosis that is improved from the immediate post PCI ~90%.  Left Circumflex: Large caliber vessel with a  Small OM1 proximally that bifurcates into a moderate to large OM2 and the AV Groove Circumflex that bifurcates into OM3 and a small LPL branch.  Minimal luminal irregularities.  Ramus intermedius: Moderate to large  caliber vessel that courses as a "High OM1"; minimal luminal irregularities.  RCA: large caliber, dominant vessel with a proximal ~40% stenosis (with catheter induced "kinking" at the slight "sherpherd's crook" bend.  There is a small to moderate (but major) RV marginal branch that arises early in the mid-vessel.  The RCA bifurcates just after the inferior bend into the Right Posterior Descending Artery and the Right Posterior AV Groove Branch (RPAV); no angiographically significant disease.  RPDA: moderate caliber vessel that reaches almost to the apex, minimal luminal irregularities.  RPL Sysytem:The RPAV begins as a moderate caliber vessel that bifurcates into two small to moderate caliber Right Posterolateral Branches; minimal luminal irregularities.  PATIENT DISPOSITION:    The patient was transferred to the PACU holding area in a hemodynamicaly stable, chest pain free condition.  The patient tolerated the procedure well, and there were no complications.  EBL:   < 10 ml  The patient was stable before, during, and after the procedure.  POST-OPERATIVE DIAGNOSIS:    Widely patent proximal to mid LAD DES stent with no evidence of ISR or thrombosis.  The jailed large septal trunk has improved ostial stenosis with brisk filling.    No angiographic evidence of additional CAD lesions to explain the patient's CP symptoms - suspect a non-cardiac etiology.  Well preserved LVEF.  PLAN OF CARE:  Post Radial cath care with hydration & TR Band removal.  D/c home today on current Medical Regimen -- ensuring DAPT.  ROV with me as scheduled.   Marykay Lex, M.D., M.S. [facility] [address] [place], [place]  [address]  [phone]  12/17/2012 11:34 AM

## 2012-12-22 ENCOUNTER — Encounter: Payer: Self-pay | Admitting: Cardiology

## 2012-12-22 ENCOUNTER — Ambulatory Visit (INDEPENDENT_AMBULATORY_CARE_PROVIDER_SITE_OTHER): Payer: Medicaid Other | Admitting: Cardiology

## 2012-12-22 ENCOUNTER — Ambulatory Visit (HOSPITAL_COMMUNITY)
Admission: RE | Admit: 2012-12-22 | Discharge: 2012-12-22 | Disposition: A | Discharge status: Home / Self Care | Payer: Medicaid Other | Source: Ambulatory Visit | Attending: Cardiovascular Disease | Admitting: Cardiovascular Disease

## 2012-12-22 VITALS — BP 120/72 | HR 60 | Ht 76.5 in | Wt 186.0 lb

## 2012-12-22 DIAGNOSIS — I2510 Atherosclerotic heart disease of native coronary artery without angina pectoris: Secondary | ICD-10-CM

## 2012-12-22 DIAGNOSIS — M25531 Pain in right wrist: Secondary | ICD-10-CM

## 2012-12-22 DIAGNOSIS — T148XXA Other injury of unspecified body region, initial encounter: Secondary | ICD-10-CM

## 2012-12-22 DIAGNOSIS — R600 Localized edema: Secondary | ICD-10-CM

## 2012-12-22 DIAGNOSIS — F17200 Nicotine dependence, unspecified, uncomplicated: Secondary | ICD-10-CM

## 2012-12-22 DIAGNOSIS — Z720 Tobacco use: Secondary | ICD-10-CM

## 2012-12-22 DIAGNOSIS — M25539 Pain in unspecified wrist: Secondary | ICD-10-CM

## 2012-12-22 DIAGNOSIS — R609 Edema, unspecified: Secondary | ICD-10-CM

## 2012-12-22 DIAGNOSIS — E785 Hyperlipidemia, unspecified: Secondary | ICD-10-CM

## 2012-12-22 DIAGNOSIS — R079 Chest pain, unspecified: Secondary | ICD-10-CM

## 2012-12-22 DIAGNOSIS — M7989 Other specified soft tissue disorders: Secondary | ICD-10-CM

## 2012-12-22 DIAGNOSIS — M79609 Pain in unspecified limb: Secondary | ICD-10-CM

## 2012-12-22 MED ORDER — PANTOPRAZOLE SODIUM 40 MG PO TBEC: 40.0000 mg | DELAYED_RELEASE_TABLET | Freq: Every day | ORAL | Status: DC

## 2012-12-22 MED ORDER — TRAMADOL HCL 50 MG PO TABS: 50.0000 mg | ORAL_TABLET | Freq: Four times a day (QID) | ORAL | Status: DC | PRN

## 2012-12-22 NOTE — Assessment & Plan Note (Signed)
Hx of Proximal LAD stent from 2013, now on cath 12/17/12 patent stent, stable CAD, not felt to be cardiac related chest pain.  Will add protonix to meds.

## 2012-12-22 NOTE — Progress Notes (Signed)
[facility]  12/22/2012   PCP: Pcp Not In System   Chief Complaint  Patient presents with  . Hand Pain    right hand/wrist swelling and pain since yesterday    Primary Cardiologist: Dr. Herbie Baltimore  HPI: 52 y.o. male history with NSTEMI back in June 2013. He was found to have a hazy ulcerated plaque in the proximal LAD. This was treated with overnight Integrilin, but was persistent on relook catheterization. Dr. Herbie Baltimore then treated the lesion the proximal LAD with a Promus element DES 4.0 mm 28 mm.   Unfortunately he was incarcerated after MI and did not have much follow up.  He was seen for intermittent chest pain with exertion as well as shortness of breath with exertion since November of last year. He noted that this was after a several month period with no active symptoms. His symptoms began when he was incarcerated. He says that for several weeks he didn't get his Prasugrel. He was then switched to Plavix, was not receiving aspirin.  Dr. Herbie Baltimore felt cardiac cath would be most prudent means to evaluate his chest pain.   Only had patent stent to the LAD without any evidence of in-stent restenosis or thrombus the jailed large septal trunk had improved with ostial stenosis with brisk filling.  No additional coronary artery lesions Dr. Herbie Baltimore believe the patient's current chest pain was noncardiac. He was also found to have normal LVEF.  Patient stated initially he did well as this was a right radial cath but the next morning his right hand was swollen like a baseball glove and pain of the right wrist he called and talked with our office and he is here today for followup.   Today the hand is improved on but significant right wrist pain with any movement or touch.  He also relates he could not take the isosorbide mononitrate and has stopped that but continues with episodic chest pain.  I saw the patient and then we sent him for arterial Dopplers of the right wrist.   There was no blockage in the radial artery.   Allergies  Allergen Reactions  . Ibuprofen Other (See Comments)    Doctor told him he could not take    Current Outpatient Prescriptions  Medication Sig Dispense Refill  . Ascorbic Acid (VITAMIN C) 1000 MG tablet Take 1,000 mg by mouth daily.      Marland Kitchen aspirin EC 81 MG EC tablet Take 1 tablet (81 mg total) by mouth daily.      Marland Kitchen atorvastatin (LIPITOR) 80 MG tablet Take 0.5 tablets (40 mg total) by mouth daily at 6 PM.  90 tablet  5  . folic acid (FOLVITE) 1 MG tablet Take 1 tablet (1 mg total) by mouth daily.  30 tablet  5  . isosorbide mononitrate (IMDUR) 30 MG 24 hr tablet Take 1 tablet (30 mg total) by mouth daily.  90 tablet  3  . lisinopril (PRINIVIL,ZESTRIL) 5 MG tablet Take 0.5 tablets (2.5 mg total) by mouth daily.      . nitroGLYCERIN (NITROSTAT) 0.4 MG SL tablet Place 0.4 mg under the tongue every 5 (five) minutes as needed for chest pain.      Marland Kitchen PARoxetine (PAXIL) 10 MG tablet Take 10 mg by mouth every morning.      . Ticagrelor (BRILINTA) 90 MG TABS tablet Take 1 tablet (90 mg total) by mouth 2 (two) times daily.  60 tablet  10  . albuterol (PROVENTIL HFA;VENTOLIN  HFA) 108 (90 BASE) MCG/ACT inhaler Inhale 2 puffs into the lungs every 4 (four) hours as needed for wheezing.      . pantoprazole (PROTONIX) 40 MG tablet Take 1 tablet (40 mg total) by mouth daily.  30 tablet  11  . QUEtiapine (SEROQUEL XR) 300 MG 24 hr tablet Take 300 mg by mouth at bedtime.      . traMADol (ULTRAM) 50 MG tablet Take 1 tablet (50 mg total) by mouth every 6 (six) hours as needed for pain.  30 tablet  0   No current facility-administered medications for this visit.    Past Medical History  Diagnosis Date  . Depression   . Degenerative disc disease, cervical     Dx years ago  . CAD (coronary artery disease) 02/2012    NSTEMI - PCI to prox LAD  . Presence of drug coated stent in LAD coronary artery 02/2012    Promus Element 4.0 mm x 28 mm  . MI  (myocardial infarction) 02/02/2012    Normal 2D Echo - EF-60  . Hematoma, s/p cath -rt wrist 12/22/2012  . Chest pain, stable cardiac cath 12/2012 12/22/2012    Past Surgical History  Procedure Laterality Date  . Tumor removal      left foot third toe  . Anterior cervical decomp/discectomy fusion  11/28/2011    Procedure: ANTERIOR CERVICAL DECOMPRESSION/DISCECTOMY FUSION 1 LEVEL;  Surgeon: Temple Pacini, MD;  Location: [facility];  Service: Neurosurgery;  Laterality: N/A;  Anterior Cervical Six-Seven Decompression and Fusion  . Cardiac catheterization  01/31/2012    Promeus Element DES 4 x 28, balloon-Emerge monorail  . Cardiac catheterization  12/17/12    patent stent, stable CAD    YNW:GNFAOZH:YQ colds or fevers, no weight changes Skin:no rashes or ulcers HEENT:no blurred vision, no congestion CV:see HPI PUL:see HPI GI:no diarrhea constipation or melena, no indigestion GU:no hematuria, no dysuria MS:no joint pain - except right wrist pain from hematoma, no claudication Neuro:no syncope, no lightheadedness Endo:no diabetes, no thyroid disease  PHYSICAL EXAM BP 120/72  Pulse 60  Ht 6' 4.5" (1.943 m)  Wt 186 lb (84.369 kg)  BMI 22.35 kg/m2 General:Pleasant affect, NAD Skin:Warm and dry, brisk capillary refill HEENT:normocephalic, sclera clear, mucus membranes moist Neck:supple, no JVD, no bruits  Heart:S1S2 RRR without murmur, gallup, rub or click Lungs:clear without rales, rhonchi, or wheezes MVH:QION, non tender, + BS, do not palpate liver spleen or masses Ext:no lower ext edema, 2+ pedal pulses, 2+ radial pulses, and 2+ rt ulna pulse.  Right wrist with obvious hematoma extremely tender to touch but he is able to move his fingers all fingers and palm are equally warm with brisk capillary refill. Neuro:alert and oriented, MAE, follows commands, + facial symmetry    ASSESSMENT AND PLAN Hematoma, s/p cath -rt wrist Rt hand and wrist swollen day after procedure, now continues  with pain and swelling in wrist alone.  Will check doppler.  Which revealed no clot. Vessel open. Will prescribe Ultram 50 mg every 8 hours prn.  Wrist brace but to move fingers.  And I will follow up in 2 weeks.  Dr. Herbie Baltimore also spoke to the patient.  CAD (coronary artery disease), native coronary artery Hx of Proximal LAD stent from 2013, now on cath 12/17/12 patent stent, stable CAD, not felt to be cardiac related chest pain.  Will add protonix to meds.   Dyslipidemia, goal LDL below 70 stable  Tobacco abuse Discussed stopping.

## 2012-12-22 NOTE — Assessment & Plan Note (Signed)
Discussed stopping 

## 2012-12-22 NOTE — Assessment & Plan Note (Signed)
stable °

## 2012-12-22 NOTE — Progress Notes (Signed)
Left Upper Ext. Arterial Limited Completed. No evidence of pseudoaneurysm noted. Marilynne Halsted, RDMS, RVT

## 2012-12-22 NOTE — Patient Instructions (Signed)
  Vernona Rieger wants you to follow-up with her in : 2 weeks                                                         Your physician has recommended you make the following change in your medication: start Protonix 40mg  daily, you may use Tramadol every 8 hours to help with the discomfor  YOU MAY USE A WRIST SPLINT TO HELP WITH THE DISCOMFORT, BUT PLEASE CONTINUE TO MOVE FINGERS FREQUENTLY.

## 2012-12-22 NOTE — Assessment & Plan Note (Addendum)
Rt hand and wrist swollen day after procedure, now continues with pain and swelling in wrist alone.  Will check doppler.  Which revealed no clot. Vessel open. Will prescribe Ultram 50 mg every 8 hours prn.  Wrist brace but to move fingers.  And I will follow up in 2 weeks.  Dr. Herbie Baltimore also spoke to the patient.

## 2013-01-07 ENCOUNTER — Ambulatory Visit: Payer: Medicaid Other | Admitting: Cardiology

## 2013-01-11 ENCOUNTER — Encounter: Payer: Self-pay | Admitting: Cardiology

## 2013-01-11 ENCOUNTER — Ambulatory Visit (INDEPENDENT_AMBULATORY_CARE_PROVIDER_SITE_OTHER): Payer: Medicaid Other | Admitting: Cardiology

## 2013-01-11 VITALS — BP 130/92 | Ht 76.5 in | Wt 187.4 lb

## 2013-01-11 DIAGNOSIS — K921 Melena: Secondary | ICD-10-CM | POA: Insufficient documentation

## 2013-01-11 DIAGNOSIS — E785 Hyperlipidemia, unspecified: Secondary | ICD-10-CM

## 2013-01-11 DIAGNOSIS — T148XXA Other injury of unspecified body region, initial encounter: Secondary | ICD-10-CM

## 2013-01-11 DIAGNOSIS — F17200 Nicotine dependence, unspecified, uncomplicated: Secondary | ICD-10-CM

## 2013-01-11 DIAGNOSIS — Z720 Tobacco use: Secondary | ICD-10-CM

## 2013-01-11 DIAGNOSIS — I2510 Atherosclerotic heart disease of native coronary artery without angina pectoris: Secondary | ICD-10-CM

## 2013-01-11 NOTE — Assessment & Plan Note (Signed)
Was bright blood, none recently.

## 2013-01-11 NOTE — Assessment & Plan Note (Signed)
Wearing wrist splint most of pain resolved.

## 2013-01-11 NOTE — Progress Notes (Signed)
01/11/2013   PCP: Pcp Not In System   Chief Complaint  Patient presents with  . follow up cardiac cath    5/16 Dr. Herbie Baltimore; blood in stool x 3 days, stopped 1 week ago    Primary Cardiologist:Dr. Ranae Palms   HPI: 52 y.o. male history with NSTEMI back in June 2013. He was found to have a hazy ulcerated plaque in the proximal LAD. This was treated with overnight Integrilin, but was persistent on relook catheterization. Dr. Herbie Baltimore then treated the lesion the proximal LAD with a Promus element DES 4.0 mm 28 mm. Unfortunately he was incarcerated after MI and did not have much follow up. He was seen for intermittent chest pain with exertion as well as shortness of breath with exertion since November of last year. He noted that this was after a several month period with no active symptoms. His symptoms began when he was incarcerated. He says that for several weeks he didn't get his Prasugrel. He was then switched to Plavix, was not receiving aspirin. Dr. Herbie Baltimore felt cardiac cath would be most prudent means to evaluate his chest pain. Only had patent stent to the LAD without any evidence of in-stent restenosis or thrombus the jailed large septal trunk had improved with ostial stenosis with brisk filling. No additional coronary artery lesions Dr. Herbie Baltimore believe the patient's current chest pain was noncardiac. He was also found to have normal LVEF. Patient stated initially he did well as this was a right radial cath but the next morning his right hand was swollen like a baseball glove and pain of the right wrist he called and talked with our office and he is here today for followup.  This has since resolved and he is wearing a wrist splint to help with any discomfort from the nerve irritation. Followup Dopplers revealed normal radial artery.  Today is here for followup he's been stable, no chest pain no shortness of breath. Right wrist with some discomfort at times. Does complain of bright blood in  the stool happened several times and then has stopped possibility of hemorrhoids.  The patient is facing the possibility of incarceration again and has asked [place] to write a letter explaining his medical facts. I have done this. Though I did explain that I can only give facts, I cannot control the correctional system.   Allergies  Allergen Reactions  . Ibuprofen Other (See Comments)    Doctor told him he could not take    Current Outpatient Prescriptions  Medication Sig Dispense Refill  . Ascorbic Acid (VITAMIN C) 1000 MG tablet Take 1,000 mg by mouth daily.      Marland Kitchen aspirin EC 81 MG EC tablet Take 1 tablet (81 mg total) by mouth daily.      Marland Kitchen atorvastatin (LIPITOR) 80 MG tablet Take 80 mg by mouth daily at 6 PM.      . folic acid (FOLVITE) 1 MG tablet Take 1 tablet (1 mg total) by mouth daily.  30 tablet  5  . isosorbide mononitrate (IMDUR) 30 MG 24 hr tablet Take 1 tablet (30 mg total) by mouth daily.  90 tablet  3  . lisinopril (PRINIVIL,ZESTRIL) 5 MG tablet Take 5 mg by mouth daily.      . nitroGLYCERIN (NITROSTAT) 0.4 MG SL tablet Place 0.4 mg under the tongue every 5 (five) minutes as needed for chest pain.      . pantoprazole (PROTONIX) 40 MG tablet Take 1 tablet (40 mg total) by  mouth daily.  30 tablet  11  . PARoxetine (PAXIL) 10 MG tablet Take 10 mg by mouth every morning.      Marland Kitchen QUEtiapine (SEROQUEL XR) 300 MG 24 hr tablet Take 300 mg by mouth at bedtime.      . Ticagrelor (BRILINTA) 90 MG TABS tablet Take 90 mg by mouth once.      . traMADol (ULTRAM) 50 MG tablet Take 1 tablet (50 mg total) by mouth every 6 (six) hours as needed for pain.  30 tablet  0  . albuterol (PROVENTIL HFA;VENTOLIN HFA) 108 (90 BASE) MCG/ACT inhaler Inhale 2 puffs into the lungs every 4 (four) hours as needed for wheezing.       No current facility-administered medications for this visit.    Past Medical History  Diagnosis Date  . Depression   . Degenerative disc disease, cervical     Dx years ago  .  CAD (coronary artery disease) 02/2012    NSTEMI - PCI to prox LAD  . Presence of drug coated stent in LAD coronary artery 02/2012    Promus Element 4.0 mm x 28 mm  . MI (myocardial infarction) 02/02/2012    Normal 2D Echo - EF-60  . Hematoma, s/p cath -rt wrist 12/22/2012  . Chest pain, stable cardiac cath 12/2012 12/22/2012    Past Surgical History  Procedure Laterality Date  . Tumor removal      left foot third toe  . Anterior cervical decomp/discectomy fusion  11/28/2011    Procedure: ANTERIOR CERVICAL DECOMPRESSION/DISCECTOMY FUSION 1 LEVEL;  Surgeon: Temple Pacini, MD;  Location: [facility];  Service: Neurosurgery;  Laterality: N/A;  Anterior Cervical Six-Seven Decompression and Fusion  . Cardiac catheterization  01/31/2012    Promeus Element DES 4 x 28, balloon-Emerge monorail  . Cardiac catheterization  12/17/12    patent stent, stable CAD    WUJ:WJXBJYN:WG colds or fevers, no weight changes Skin:no rashes or ulcers HEENT:no blurred vision, no congestion CV:see HPI PUL:see HPI GI:no diarrhea constipation or a week ago some bright blood in stool, no indigestion GU:no hematuria, no dysuria MS:no joint pain, no claudication Neuro:no syncope, no lightheadedness Endo:no diabetes, no thyroid disease  PHYSICAL EXAM BP 130/92  Ht 6' 4.5" (1.943 m)  Wt 187 lb 6.4 oz (85.004 kg)  BMI 22.52 kg/m2 General:Pleasant affect, NAD Skin:Warm and dry, brisk capillary refill HEENT:normocephalic, sclera clear, mucus membranes moist Neck:supple, no JVD, no bruits  Heart:S1S2 RRR without murmur, gallup, rub or click Lungs:clear without rales, rhonchi, or wheezes NFA:OZHY, non tender, + BS, do not palpate liver spleen or masses Ext:no lower ext edema, 2+ pedal pulses, 2+ radial pulses Neuro:alert and oriented, MAE, follows commands, + facial symmetry   ASSESSMENT AND PLAN Hematoma, s/p cath -rt wrist Wearing wrist splint most of pain resolved.  CAD (coronary artery disease), native  coronary artery Stable cath.    Tobacco abuse Continues to smoke, have asked him to make 03/04/13 as his quit day,  He is down to 5 or so cigarettes a day.  Dyslipidemia, goal LDL below 70 Discussed eating healthy  Blood in stool, resolved Was bright blood, none recently.     Recommendations continue current medications. Patient should certainly continue Brilinta.Marland Kitchen  He'll follow with Dr. Herbie Baltimore in several months. I have written a letter to him it may concern concerning his medical problems from a cardiology perspective and the patient will pick up the letter in the morning.

## 2013-01-11 NOTE — Assessment & Plan Note (Signed)
Discussed eating healthy

## 2013-01-11 NOTE — Assessment & Plan Note (Signed)
Stable cath.

## 2013-01-11 NOTE — Assessment & Plan Note (Signed)
Continues to smoke, have asked him to make 03/04/13 as his quit day,  He is down to 5 or so cigarettes a day.

## 2013-01-11 NOTE — Patient Instructions (Signed)
I will have letter ready for you in the morning, but again I can only give your medical history he will be up to the court to decide anything further.  Call for any cardiac problems, follow up with Dr. Herbie Baltimore in 4-5 months.  Make your goal to stop tobacco use 03/04/2013.

## 2013-03-04 DIAGNOSIS — I2699 Other pulmonary embolism without acute cor pulmonale: Secondary | ICD-10-CM

## 2013-03-04 HISTORY — DX: Other pulmonary embolism without acute cor pulmonale: I26.99

## 2013-03-13 ENCOUNTER — Emergency Department (HOSPITAL_COMMUNITY): Payer: Medicaid Other

## 2013-03-13 ENCOUNTER — Inpatient Hospital Stay (HOSPITAL_COMMUNITY)
Admission: EM | Admit: 2013-03-13 | Discharge: 2013-03-16 | DRG: 175 | Disposition: A | Discharge status: Home / Self Care | Payer: Medicaid Other | Attending: Internal Medicine | Admitting: Internal Medicine

## 2013-03-13 DIAGNOSIS — F17200 Nicotine dependence, unspecified, uncomplicated: Secondary | ICD-10-CM | POA: Diagnosis present

## 2013-03-13 DIAGNOSIS — I252 Old myocardial infarction: Secondary | ICD-10-CM

## 2013-03-13 DIAGNOSIS — Z886 Allergy status to analgesic agent status: Secondary | ICD-10-CM

## 2013-03-13 DIAGNOSIS — J9601 Acute respiratory failure with hypoxia: Secondary | ICD-10-CM

## 2013-03-13 DIAGNOSIS — Z720 Tobacco use: Secondary | ICD-10-CM

## 2013-03-13 DIAGNOSIS — D649 Anemia, unspecified: Secondary | ICD-10-CM | POA: Diagnosis present

## 2013-03-13 DIAGNOSIS — I2699 Other pulmonary embolism without acute cor pulmonale: Principal | ICD-10-CM

## 2013-03-13 DIAGNOSIS — F329 Major depressive disorder, single episode, unspecified: Secondary | ICD-10-CM | POA: Diagnosis present

## 2013-03-13 DIAGNOSIS — M5010 Cervical disc disorder with radiculopathy, unspecified cervical region: Secondary | ICD-10-CM

## 2013-03-13 DIAGNOSIS — F1010 Alcohol abuse, uncomplicated: Secondary | ICD-10-CM

## 2013-03-13 DIAGNOSIS — K921 Melena: Secondary | ICD-10-CM

## 2013-03-13 DIAGNOSIS — F1410 Cocaine abuse, uncomplicated: Secondary | ICD-10-CM | POA: Diagnosis present

## 2013-03-13 DIAGNOSIS — Z9861 Coronary angioplasty status: Secondary | ICD-10-CM

## 2013-03-13 DIAGNOSIS — I2510 Atherosclerotic heart disease of native coronary artery without angina pectoris: Secondary | ICD-10-CM

## 2013-03-13 DIAGNOSIS — F1210 Cannabis abuse, uncomplicated: Secondary | ICD-10-CM | POA: Diagnosis present

## 2013-03-13 DIAGNOSIS — R079 Chest pain, unspecified: Secondary | ICD-10-CM

## 2013-03-13 DIAGNOSIS — I214 Non-ST elevation (NSTEMI) myocardial infarction: Secondary | ICD-10-CM

## 2013-03-13 DIAGNOSIS — I200 Unstable angina: Secondary | ICD-10-CM

## 2013-03-13 DIAGNOSIS — R001 Bradycardia, unspecified: Secondary | ICD-10-CM

## 2013-03-13 DIAGNOSIS — J9600 Acute respiratory failure, unspecified whether with hypoxia or hypercapnia: Secondary | ICD-10-CM | POA: Diagnosis present

## 2013-03-13 DIAGNOSIS — T148XXA Other injury of unspecified body region, initial encounter: Secondary | ICD-10-CM

## 2013-03-13 DIAGNOSIS — E785 Hyperlipidemia, unspecified: Secondary | ICD-10-CM

## 2013-03-13 DIAGNOSIS — Z8249 Family history of ischemic heart disease and other diseases of the circulatory system: Secondary | ICD-10-CM

## 2013-03-13 DIAGNOSIS — F1910 Other psychoactive substance abuse, uncomplicated: Secondary | ICD-10-CM

## 2013-03-13 DIAGNOSIS — F3289 Other specified depressive episodes: Secondary | ICD-10-CM | POA: Diagnosis present

## 2013-03-13 DIAGNOSIS — I10 Essential (primary) hypertension: Secondary | ICD-10-CM | POA: Diagnosis present

## 2013-03-13 LAB — POCT I-STAT, CHEM 8
Chloride: 104 mEq/L (ref 96–112)
Glucose, Bld: 74 mg/dL (ref 70–99)
HCT: 42 % (ref 39.0–52.0)
Hemoglobin: 14.3 g/dL (ref 13.0–17.0)
Potassium: 4 mEq/L (ref 3.5–5.1)

## 2013-03-13 LAB — POCT I-STAT TROPONIN I: Troponin i, poc: 0 ng/mL (ref 0.00–0.08)

## 2013-03-13 MED ORDER — FENTANYL CITRATE 0.05 MG/ML IJ SOLN
50.0000 ug | INTRAMUSCULAR | Status: DC | PRN
  Administered 2013-03-14 (×2): 50 ug via INTRAVENOUS
  Filled 2013-03-13: qty 2

## 2013-03-13 MED ORDER — NITROGLYCERIN IN D5W 200-5 MCG/ML-% IV SOLN
5.0000 ug/min | INTRAVENOUS | Status: DC
  Administered 2013-03-14: 5 ug/min via INTRAVENOUS
  Filled 2013-03-13: qty 250
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
## 2013-03-13 NOTE — ED Notes (Signed)
EMS-pt reports left sided chest pain that started yesterday radiating down left arm associated with sob and diaphoresis. Pt given 324ASA 4 nitros en route and 6mg  of morphine, no change in pain. 10/10 pt appears very uncomfortable and is very diaphoretic. 18g(L)AC. Hx of MI

## 2013-03-14 ENCOUNTER — Encounter (HOSPITAL_COMMUNITY): Payer: Self-pay | Admitting: Internal Medicine

## 2013-03-14 ENCOUNTER — Observation Stay (HOSPITAL_COMMUNITY): Payer: Medicaid Other

## 2013-03-14 DIAGNOSIS — J9601 Acute respiratory failure with hypoxia: Secondary | ICD-10-CM | POA: Diagnosis present

## 2013-03-14 DIAGNOSIS — I2699 Other pulmonary embolism without acute cor pulmonale: Secondary | ICD-10-CM | POA: Diagnosis present

## 2013-03-14 DIAGNOSIS — R079 Chest pain, unspecified: Secondary | ICD-10-CM

## 2013-03-14 LAB — HEPARIN LEVEL (UNFRACTIONATED): Heparin Unfractionated: 0.95 IU/mL — ABNORMAL HIGH (ref 0.30–0.70)

## 2013-03-14 LAB — CBC
MCV: 90.7 fL (ref 78.0–100.0)
Platelets: 290 10*3/uL (ref 150–400)
RBC: 4.39 MIL/uL (ref 4.22–5.81)
RDW: 13.6 % (ref 11.5–15.5)
WBC: 9.5 10*3/uL (ref 4.0–10.5)

## 2013-03-14 LAB — TROPONIN I: Troponin I: <0.3 ng/mL (ref <0.30)

## 2013-03-14 LAB — LIPASE, BLOOD: Lipase: 24 U/L (ref 11–59)

## 2013-03-14 MED ORDER — LISINOPRIL 5 MG PO TABS
5.0000 mg | ORAL_TABLET | Freq: Every day | ORAL | Status: DC
  Administered 2013-03-14 – 2013-03-16 (×3): 5 mg via ORAL
  Filled 2013-03-14 (×3): qty 1

## 2013-03-14 MED ORDER — HEPARIN SODIUM (PORCINE) 5000 UNIT/ML IJ SOLN: 5000.0000 [IU] | Freq: Three times a day (TID) | INTRAMUSCULAR | Status: DC

## 2013-03-14 MED ORDER — TICAGRELOR 90 MG PO TABS
90.0000 mg | ORAL_TABLET | Freq: Two times a day (BID) | ORAL | Status: DC
  Administered 2013-03-14 – 2013-03-16 (×4): 90 mg via ORAL
  Filled 2013-03-14 (×6): qty 1

## 2013-03-14 MED ORDER — GI COCKTAIL ~~LOC~~: 30.0000 mL | Freq: Once | ORAL | Status: DC

## 2013-03-14 MED ORDER — FENTANYL CITRATE 0.05 MG/ML IJ SOLN: 50.0000 ug | INTRAMUSCULAR | Status: DC | PRN

## 2013-03-14 MED ORDER — ONDANSETRON HCL 4 MG/2ML IJ SOLN: 4.0000 mg | Freq: Four times a day (QID) | INTRAMUSCULAR | Status: DC | PRN

## 2013-03-14 MED ORDER — ASPIRIN EC 81 MG PO TBEC
81.0000 mg | DELAYED_RELEASE_TABLET | Freq: Every day | ORAL | Status: DC
  Administered 2013-03-14 – 2013-03-16 (×3): 81 mg via ORAL
  Filled 2013-03-14 (×3): qty 1

## 2013-03-14 MED ORDER — ISOSORBIDE MONONITRATE ER 30 MG PO TB24
30.0000 mg | ORAL_TABLET | Freq: Every day | ORAL | Status: DC
  Administered 2013-03-14 – 2013-03-15 (×2): 30 mg via ORAL
  Filled 2013-03-14 (×3): qty 1

## 2013-03-14 MED ORDER — PAROXETINE HCL 10 MG PO TABS
10.0000 mg | ORAL_TABLET | ORAL | Status: DC
  Administered 2013-03-14 – 2013-03-16 (×3): 10 mg via ORAL
  Filled 2013-03-14 (×5): qty 1

## 2013-03-14 MED ORDER — ADULT MULTIVITAMIN W/MINERALS CH
1.0000 | ORAL_TABLET | Freq: Every day | ORAL | Status: DC
  Administered 2013-03-14 – 2013-03-16 (×3): 1 via ORAL
  Filled 2013-03-14 (×3): qty 1

## 2013-03-14 MED ORDER — VITAMIN B-1 100 MG PO TABS
100.0000 mg | ORAL_TABLET | Freq: Every day | ORAL | Status: DC
  Administered 2013-03-14 – 2013-03-16 (×3): 100 mg via ORAL
  Filled 2013-03-14 (×3): qty 1

## 2013-03-14 MED ORDER — FOLIC ACID 1 MG PO TABS
1.0000 mg | ORAL_TABLET | Freq: Every day | ORAL | Status: DC
  Administered 2013-03-14 – 2013-03-16 (×3): 1 mg via ORAL
  Filled 2013-03-14 (×3): qty 1

## 2013-03-14 MED ORDER — QUETIAPINE FUMARATE ER 300 MG PO TB24
300.0000 mg | ORAL_TABLET | Freq: Every day | ORAL | Status: DC
  Administered 2013-03-14 – 2013-03-15 (×2): 300 mg via ORAL
  Filled 2013-03-14 (×4): qty 1

## 2013-03-14 MED ORDER — PANTOPRAZOLE SODIUM 40 MG PO TBEC
40.0000 mg | DELAYED_RELEASE_TABLET | Freq: Every day | ORAL | Status: DC
  Administered 2013-03-14 – 2013-03-16 (×3): 40 mg via ORAL
  Filled 2013-03-14 (×2): qty 1

## 2013-03-14 MED ORDER — IOHEXOL 350 MG/ML SOLN
100.0000 mL | Freq: Once | INTRAVENOUS | Status: AC | PRN
  Administered 2013-03-14: 100 mL via INTRAVENOUS

## 2013-03-14 MED ORDER — HYDROMORPHONE HCL PF 1 MG/ML IJ SOLN: 1.0000 mg | Freq: Once | INTRAMUSCULAR | Status: DC

## 2013-03-14 MED ORDER — ATORVASTATIN CALCIUM 80 MG PO TABS
80.0000 mg | ORAL_TABLET | Freq: Every day | ORAL | Status: DC
  Administered 2013-03-14 – 2013-03-15 (×2): 80 mg via ORAL
  Filled 2013-03-14 (×3): qty 1

## 2013-03-14 MED ORDER — FOLIC ACID 1 MG PO TABS: 1.0000 mg | ORAL_TABLET | Freq: Every day | ORAL | Status: DC

## 2013-03-14 MED ORDER — TICAGRELOR 90 MG PO TABS
90.0000 mg | ORAL_TABLET | Freq: Every day | ORAL | Status: DC
  Administered 2013-03-14: 90 mg via ORAL
  Filled 2013-03-14: qty 1

## 2013-03-14 MED ORDER — HEPARIN (PORCINE) IN NACL 100-0.45 UNIT/ML-% IJ SOLN
1350.0000 [IU]/h | INTRAMUSCULAR | Status: DC
  Administered 2013-03-14 (×2): 1350 [IU]/h via INTRAVENOUS
  Filled 2013-03-14: qty 250

## 2013-03-14 MED ORDER — OXYCODONE HCL 5 MG PO TABS: 5.0000 mg | ORAL_TABLET | ORAL | Status: DC | PRN

## 2013-03-14 MED ORDER — HEPARIN BOLUS VIA INFUSION
5000.0000 [IU] | Freq: Once | INTRAVENOUS | Status: AC
  Administered 2013-03-14: 5000 [IU] via INTRAVENOUS

## 2013-03-14 MED ORDER — HEPARIN (PORCINE) IN NACL 100-0.45 UNIT/ML-% IJ SOLN
1200.0000 [IU]/h | INTRAMUSCULAR | Status: DC
Start: 2013-03-14 — End: 2013-03-16
  Administered 2013-03-14 – 2013-03-15 (×2): 1200 [IU]/h via INTRAVENOUS
  Filled 2013-03-14 (×3): qty 250

## 2013-03-14 MED ORDER — HEPARIN (PORCINE) IN NACL 100-0.45 UNIT/ML-% IJ SOLN
1500.0000 [IU]/h | INTRAMUSCULAR | Status: DC
  Administered 2013-03-14: 1500 [IU]/h via INTRAVENOUS
  Filled 2013-03-14 (×2): qty 250

## 2013-03-14 MED ORDER — ALBUTEROL SULFATE HFA 108 (90 BASE) MCG/ACT IN AERS: 2.0000 | INHALATION_SPRAY | RESPIRATORY_TRACT | Status: DC | PRN

## 2013-03-14 MED ORDER — LORAZEPAM 1 MG PO TABS: 1.0000 mg | ORAL_TABLET | Freq: Four times a day (QID) | ORAL | Status: DC | PRN

## 2013-03-14 MED ORDER — SODIUM CHLORIDE 0.9 % IV SOLN
INTRAVENOUS | Status: DC
  Administered 2013-03-14 – 2013-03-15 (×4): via INTRAVENOUS

## 2013-03-14 MED ORDER — FENTANYL CITRATE 0.05 MG/ML IJ SOLN
50.0000 ug | INTRAMUSCULAR | Status: DC | PRN
  Administered 2013-03-14 (×2): 100 ug via INTRAVENOUS
  Filled 2013-03-14 (×2): qty 2

## 2013-03-14 MED ORDER — THIAMINE HCL 100 MG/ML IJ SOLN
100.0000 mg | Freq: Every day | INTRAMUSCULAR | Status: DC
  Filled 2013-03-14: qty 1

## 2013-03-14 MED ORDER — ACETAMINOPHEN 325 MG PO TABS: 650.0000 mg | ORAL_TABLET | ORAL | Status: DC | PRN

## 2013-03-14 MED ORDER — LORAZEPAM 2 MG/ML IJ SOLN: 1.0000 mg | Freq: Four times a day (QID) | INTRAMUSCULAR | Status: DC | PRN

## 2013-03-14 NOTE — ED Notes (Signed)
Admitting MD at BS.  

## 2013-03-14 NOTE — Progress Notes (Signed)
Report received from [facility].  Assumed care of patient.  Arrived to unit via stretcher from the ED.  Alert and oriented x4.  Complains of pain 10/10.  Nitro infusing.  Heparin gtt initiated.  POC reviewed.  Skin assessment complete.  No areas of pressure noted.

## 2013-03-14 NOTE — Progress Notes (Signed)
ANTICOAGULATION CONSULT NOTE - Follow Up Consult  Pharmacy Consult for Heparin Indication: pulmonary embolus  Allergies  Allergen Reactions  . Ibuprofen Other (See Comments)    Doctor told him he could not take    Patient Measurements: Height: 6' 4.38" (194 cm) Weight: 190 lb 14.7 oz (86.6 kg) IBW/kg (Calculated) : 87.67 Heparin Dosing Weight: 86.6kg  Vital Signs: Temp: 99 F (37.2 C) (08/11 1945) Temp src: Oral (08/11 1556) BP: 125/68 mmHg (08/11 1945) Pulse Rate: 59 (08/11 1700)  Labs:  Recent Labs  03/13/13 2335 03/13/13 2344 03/14/13 1200 03/14/13 2029  HGB 14.2 14.3  --   --   HCT 39.8 42.0  --   --   PLT 290  --   --   --   HEPARINUNFRC  --   --  0.90* 0.95*  CREATININE  --  1.40*  --   --   TROPONINI <0.30  --   --   --     Estimated Creatinine Clearance: 77.3 ml/min (by C-G formula based on Cr of 1.4).   Medications:  Heparin 1350 units/hr   Assessment: 52yom on heparin for bilateral PE. Heparin level (0.95) remains supratherapeutic and actually increased despite rate reduction - will plan to hold heparin drip x and restart at lower rate. - H/H and Plts wnl - No significant bleeding reported  Goal of Therapy:  Heparin level 0.3-0.7 units/ml Monitor platelets by anticoagulation protocol: Yes   Plan:  1. Hold heparin drip x 30 minutes 2. Restart heparin drip 1200 units/hr (12 ml/hr) @ 2200 3. Check heparin level 6 hours after restart  Cleon Dew [phone] 03/14/2013,9:22 PM

## 2013-03-14 NOTE — Progress Notes (Signed)
ANTICOAGULATION CONSULT NOTE - Initial Consult  Pharmacy Consult for Heparin Indication: pulmonary embolus  Allergies  Allergen Reactions  . Ibuprofen Other (See Comments)    Doctor told him he could not take    Patient Measurements: Height: 6' 4.38" (194 cm) Weight: 187 lb 6.3 oz (85 kg) IBW/kg (Calculated) : 87.67  Vital Signs: Temp: 98.7 F (37.1 C) (08/10 2341) Temp src: Oral (08/10 2341) BP: 130/88 mmHg (08/11 0300) Pulse Rate: 60 (08/11 0300)  Labs:  Recent Labs  03/13/13 2335 03/13/13 2344  HGB 14.2 14.3  HCT 39.8 42.0  PLT 290  --   CREATININE  --  1.40*  TROPONINI <0.30  --     Estimated Creatinine Clearance: 75.9 ml/min (by C-G formula based on Cr of 1.4).   Medical History: Past Medical History  Diagnosis Date  . Depression   . Degenerative disc disease, cervical     Dx years ago  . CAD (coronary artery disease) 02/2012    NSTEMI - PCI to prox LAD  . Presence of drug coated stent in LAD coronary artery 02/2012    Promus Element 4.0 mm x 28 mm  . MI (myocardial infarction) 02/02/2012    Normal 2D Echo - EF-60  . Hematoma, s/p cath -rt wrist 12/22/2012  . Chest pain, stable cardiac cath 12/2012 12/22/2012    Medications:  Albuterol  Vit C  ASA  Lipitor  Folic Acid  Imdur  Zestril  Ntg  Protonix  Paxil  Seroquel XR  Brilinta  Tramadol    Assessment: 52 yo male with PE for heparin  Goal of Therapy:  Heparin level 0.3-0.7 units/ml Monitor platelets by anticoagulation protocol: Yes   Plan:  Heparin 5000 units IV bolus, then 1500 units/hr Check heparin level in 6 hours.   Timothy Peck 03/14/2013,4:01 AM

## 2013-03-14 NOTE — Progress Notes (Signed)
ANTICOAGULATION CONSULT NOTE - Initial Consult  Pharmacy Consult for Heparin Indication: pulmonary embolus  Allergies  Allergen Reactions  . Ibuprofen Other (See Comments)    Doctor told him he could not take    Patient Measurements: Height: 6' 4.38" (194 cm) Weight: 190 lb 14.7 oz (86.6 kg) IBW/kg (Calculated) : 87.67  Vital Signs: Temp: 97.9 F (36.6 C) (08/11 1126) Temp src: Oral (08/11 1126) BP: 135/100 mmHg (08/11 1100) Pulse Rate: 79 (08/11 1126)  Labs:  Recent Labs  03/13/13 2335 03/13/13 2344 03/14/13 1200  HGB 14.2 14.3  --   HCT 39.8 42.0  --   PLT 290  --   --   HEPARINUNFRC  --   --  0.90*  CREATININE  --  1.40*  --   TROPONINI <0.30  --   --     Estimated Creatinine Clearance: 77.3 ml/min (by C-G formula based on Cr of 1.4).   Assessment: 52 yo male with PE on heparin at 1500 units/hr and the initial heparin level is above goal (HL= 0.9).  Goal of Therapy:  Heparin level 0.3-0.7 units/ml Monitor platelets by anticoagulation protocol: Yes   Plan:   -Decrease heparin to 1350 units/hr -Heparin level in 6 hours and daily wth CBC daily  Harland German, Pharm D 03/14/2013 1:13 PM
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
## 2013-03-14 NOTE — ED Notes (Signed)
VSS, no changes, pt c/o increase in CP, worse, sharper, stabbing, pt grunting, guarding resps and movement, LS CTA diminished.

## 2013-03-14 NOTE — Progress Notes (Signed)
[facility] Progress Note [place] TEAM 1 - Stepdown ICU Team   Timothy Peck ZOX:[id number] DOB: 1961-02-25 DOA: 03/13/2013 PCP: Pcp Not In System  Brief narrative: 52 year old male with known history of CAD and status post stent in 2013. Recent cardiac evaluation that included catheterization in May of 2014. This evaluation revealed no in-stent stenosis and otherwise patent coronary arteries. He presented to the emergency department with crushing left-sided and lower left chest pain that radiated to his left arm but did not radiate to the back and there was none tearing sensation associated with this pain. The pain seemed to be worse with inspiration. Patient described this pain as being identical to his previous MI chest pain. The pain was not relieved by nitroglycerin or fentanyl and was rated at 9/10. Patient denied recent travel, calf pain or swelling in his legs. Patient admitted ongoing cocaine use and state/use was 2 days prior to admission. CT angiogram of the chest done after admission did reveal bilateral pulmonary emboli with areas in the lower lungs bilaterally that were concerning for alveolar hemorrhage related to pulmonary infarction. The patient was started on IV heparin and admitted to the step. He was requiring oxygen.  Assessment/Plan:  B Pulmonary embolism and infarction - Acute respiratory failure with hypoxia /  -continue Heparin -no clear risk factors outside of tobacco abuse  -LE duplex studies pending -Acute hypercoagulable work up limited since many of the studies can result in false positives during the acute phase but can check Factor V Leiden, prothrombin gene mutation and lupus anticoagulant -will ask CM to determine copay for Xarelto -clinically remains tenuouos as pt is tachypneic and splinting - will keep in SDU  CAD, native coronary artery -stable-continue home medications  Dyslipidemia, goal LDL below 70  Substance abuse/ Alcohol abuse -UDS  positive for cocaine and THC -pt not forthright re ETOH intake so will initiate CIWA   DVT prophylaxis: IV heparin  Code Status: Full Family Communication: Patient and wife at bedside Disposition Plan/Expected LOS: Remain in stepdown  Consultants: None  Procedures: Lower extremity venous duplex - pending   Antibiotics: None  HPI/Subjective: Patient alert and continues to endorse significant chest discomfort especially with inspiration. Also remains somewhat short of breath.  Objective: Blood pressure 132/83, pulse 54, temperature 97.9 F (36.6 C), temperature source Oral, resp. rate 14, height 6' 4.38" (1.94 m), weight 86.6 kg (190 lb 14.7 oz), SpO2 92.00%.  Intake/Output Summary (Last 24 hours) at 03/14/13 1355 Last data filed at 03/14/13 1320  Gross per 24 hour  Intake 1212.88 ml  Output    700 ml  Net 512.88 ml   Exam: Followup exam completed. Patient admitted to 3:01 AM today.  Scheduled Meds:  Scheduled Meds: . aspirin EC  81 mg Oral Daily  . atorvastatin  80 mg Oral q1800  . folic acid  1 mg Oral Daily  . gi cocktail  30 mL Oral Once  .  HYDROmorphone (DILAUDID) injection  1 mg Intravenous Once  . isosorbide mononitrate  30 mg Oral Daily  . lisinopril  5 mg Oral Daily  . multivitamin with minerals  1 tablet Oral Daily  . pantoprazole  40 mg Oral Daily  . PARoxetine  10 mg Oral BH-q7a  . QUEtiapine  300 mg Oral QHS  . thiamine  100 mg Oral Daily   Or  . thiamine  100 mg Intravenous Daily  . Ticagrelor  90 mg Oral Daily   Continuous Infusions: . sodium  chloride 100 mL/hr at 03/14/13 0700  . heparin 1,350 Units/hr (03/14/13 1320)  . nitroGLYCERIN 5 mcg/min (03/14/13 0600)    Data Reviewed: Basic Metabolic Panel:  Recent Labs Lab 03/13/13 2344  NA 141  K 4.0  CL 104  GLUCOSE 74  BUN 14  CREATININE 1.40*   Liver Function Tests: No results found for this basename: AST, ALT, ALKPHOS, BILITOT, PROT, ALBUMIN,  in the last 168 hours  Recent  Labs Lab 03/14/13 0308  LIPASE 24   CBC:  Recent Labs Lab 03/13/13 2335 03/13/13 2344  WBC 9.5  --   HGB 14.2 14.3  HCT 39.8 42.0  MCV 90.7  --   PLT 290  --    Cardiac Enzymes:  Recent Labs Lab 03/13/13 2335  TROPONINI <0.30    Recent Results (from the past 240 hour(s))  MRSA PCR SCREENING     Status: None   Collection Time    03/14/13  4:27 AM      Result Value Range Status   MRSA by PCR NEGATIVE  NEGATIVE Final   Comment:            The GeneXpert MRSA Assay (FDA     approved for NASAL specimens     only), is one component of a     comprehensive MRSA colonization     surveillance program. It is not     intended to diagnose MRSA     infection nor to guide or     monitor treatment for     MRSA infections.     Studies:  Recent x-ray studies have been reviewed in detail by the Attending Physician  Junious Silk, ANP [facility] Office  [phone] Pager [phone]  **If unable to reach the above provider after paging please contact the Flow Manager @ [phone]  On-Call/Text Page:      [email or web address]      password [email or web address]  If 7PM-7AM, please contact night-coverage [email or web address] Password [email or web address] 03/14/2013, 1:55 PM   LOS: 1 day   I have personally examined this patient and reviewed the entire database. I have reviewed the above note, made any necessary editorial changes, and agree with its content.  Lonia Blood, MD [facility]

## 2013-03-14 NOTE — Significant Event (Addendum)
Update: after obtaining CT angio of chest: the patient does in fact have bilateral PEs.  Have ordered heparin gtt at this time.  Also ordering BLE dopplers for DVTs.  Have discussed this with patient, and updated problem list.
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
## 2013-03-14 NOTE — ED Notes (Signed)
VSS, no changes, alert, NAD, calm, interact8ive, to CT.

## 2013-03-15 DIAGNOSIS — I2699 Other pulmonary embolism without acute cor pulmonale: Secondary | ICD-10-CM

## 2013-03-15 DIAGNOSIS — F17200 Nicotine dependence, unspecified, uncomplicated: Secondary | ICD-10-CM

## 2013-03-15 DIAGNOSIS — J9600 Acute respiratory failure, unspecified whether with hypoxia or hypercapnia: Secondary | ICD-10-CM

## 2013-03-15 LAB — BASIC METABOLIC PANEL
CO2: 25 mEq/L (ref 19–32)
Chloride: 106 mEq/L (ref 96–112)
GFR calc Af Amer: 67 mL/min — ABNORMAL LOW (ref >90)
Potassium: 3.7 mEq/L (ref 3.5–5.1)

## 2013-03-15 LAB — CBC
Hemoglobin: 12.8 g/dL — ABNORMAL LOW (ref 13.0–17.0)
MCH: 31.8 pg (ref 26.0–34.0)
Platelets: 282 10*3/uL (ref 150–400)
RBC: 4.02 MIL/uL — ABNORMAL LOW (ref 4.22–5.81)
WBC: 10.2 10*3/uL (ref 4.0–10.5)

## 2013-03-15 LAB — HEPARIN LEVEL (UNFRACTIONATED): Heparin Unfractionated: 0.54 IU/mL (ref 0.30–0.70)

## 2013-03-15 NOTE — Progress Notes (Addendum)
[facility] Progress Note [place] TEAM 1 - Stepdown ICU Team   Timothy Peck XBJ:[id number] DOB: Mar 20, 1961 DOA: 03/13/2013 PCP: Pcp Not In System  Brief narrative: 52 year old male with known history of CAD and status post stent in 2013. Recent cardiac evaluation that included catheterization in May of 2014. This evaluation revealed no in-stent stenosis and otherwise patent coronary arteries. He presented to the emergency department with crushing left-sided and lower left chest pain that radiated to his left arm but did not radiate to the back and there was none tearing sensation associated with this pain. The pain seemed to be worse with inspiration. Patient described this pain as being identical to his previous MI chest pain. The pain was not relieved by nitroglycerin or fentanyl and was rated at 9/10. Patient denied recent travel, calf pain or swelling in his legs. Patient admitted ongoing cocaine use and state/use was 2 days prior to admission. CT angiogram of the chest done after admission did reveal bilateral pulmonary emboli with areas in the lower lungs bilaterally that were concerning for alveolar hemorrhage related to pulmonary infarction. The patient was started on IV heparin and admitted to the step. He was requiring oxygen.  Assessment/Plan:  B Pulmonary embolism and infarction - Acute respiratory failure with hypoxia /  -continue Heparin-transition to Xarelto prior to dc -no clear risk factors outside of tobacco abuse  -LE duplex studies pending -Acute hypercoagulable work up limited since many of the studies can result in false positives during the acute phase but can check Factor V Leiden, prothrombin gene mutation and lupus anticoagulant -opay for Xarelto is $3.30 on Medicaid -check ambulatory pulse ox  CAD, native coronary artery -stable-continue home medications -on Brilinta (anti platelet agent) pre admit and will need to continue after dc  Dyslipidemia, goal LDL  below 70  Substance abuse/ Alcohol abuse -UDS positive for cocaine and THC -pt not forthright re ETOH intake so will initiate CIWA -pt counseled against continued use esp cocaine   Anemia -hgb has dropped 2 gms since admit -concerned since new star full anti coagulation -check FOB and follow CBC   DVT prophylaxis: IV heparin  Code Status: Full Family Communication: Patient and wife at bedside Disposition Plan/Expected LOS: Transfer to Telemetry but keep on Team for possible DC 8/13  Consultants: None  Procedures: Lower extremity venous duplex - pending   Antibiotics: None  HPI/Subjective: Patient alert and continues to endorse significant chest discomfort especially with inspiration. Also remains somewhat short of breath.  Objective: Blood pressure 130/73, pulse 80, temperature 98.2 F (36.8 C), temperature source Oral, resp. rate 21, height 6' 4.38" (1.94 m), weight 87.8 kg (193 lb 9 oz), SpO2 96.00%.  Intake/Output Summary (Last 24 hours) at 03/15/13 1353 Last data filed at 03/15/13 1300  Gross per 24 hour  Intake 3860.12 ml  Output   3325 ml  Net 535.12 ml   Exam: General: No acute respiratory distress Lungs: Clear to auscultation bilaterally without wheezes or crackles, RA Cardiovascular: Regular rate and rhythm without murmur gallop or rub normal S1 and S2, no peripheral edema or JVD Abdomen: Nontender, nondistended, soft, bowel sounds positive, no rebound, no ascites, no appreciable mass Musculoskeletal: No significant cyanosis, clubbing of bilateral lower extremities Neurological: Alert and oriented x 3, moves all extremities x 4 without focal neurological deficits, CN 2-12 intact  Scheduled Meds:  Scheduled Meds: . aspirin EC  81 mg Oral Daily  . atorvastatin  80 mg Oral q1800  . folic  acid  1 mg Oral Daily  . gi cocktail  30 mL Oral Once  .  HYDROmorphone (DILAUDID) injection  1 mg Intravenous Once  . isosorbide mononitrate  30 mg Oral Daily  .  lisinopril  5 mg Oral Daily  . multivitamin with minerals  1 tablet Oral Daily  . pantoprazole  40 mg Oral Daily  . PARoxetine  10 mg Oral BH-q7a  . QUEtiapine  300 mg Oral QHS  . thiamine  100 mg Oral Daily  . Ticagrelor  90 mg Oral BID   Continuous Infusions: . sodium chloride 75 mL/hr at 03/15/13 1300  . heparin 1,200 Units/hr (03/15/13 1300)    Data Reviewed: Basic Metabolic Panel:  Recent Labs Lab 03/13/13 2344 03/15/13 0357  NA 141 139  K 4.0 3.7  CL 104 106  CO2  --  25  GLUCOSE 74 141*  BUN 14 10  CREATININE 1.40* 1.38*  CALCIUM  --  8.4   Liver Function Tests: No results found for this basename: AST, ALT, ALKPHOS, BILITOT, PROT, ALBUMIN,  in the last 168 hours  Recent Labs Lab 03/14/13 0308  LIPASE 24   CBC:  Recent Labs Lab 03/13/13 2335 03/13/13 2344 03/15/13 0357  WBC 9.5  --  10.2  HGB 14.2 14.3 12.8*  HCT 39.8 42.0 36.8*  MCV 90.7  --  91.5  PLT 290  --  282   Cardiac Enzymes:  Recent Labs Lab 03/13/13 2335  TROPONINI <0.30    Recent Results (from the past 240 hour(s))  MRSA PCR SCREENING     Status: None   Collection Time    03/14/13  4:27 AM      Result Value Range Status   MRSA by PCR NEGATIVE  NEGATIVE Final   Comment:            The GeneXpert MRSA Assay (FDA     approved for NASAL specimens     only), is one component of a     comprehensive MRSA colonization     surveillance program. It is not     intended to diagnose MRSA     infection nor to guide or     monitor treatment for     MRSA infections.     Studies:  Recent x-ray studies have been reviewed in detail by the Attending Physician  Junious Silk, ANP [facility] Office  [phone] Pager [phone]  **If unable to reach the above provider after paging please contact the Flow Manager @ [phone]  On-Call/Text Page:      [email or web address]      password [email or web address]  If 7PM-7AM, please contact night-coverage [email or web address] Password [facility] 03/15/2013, 1:53 PM    LOS: 2 days    I have examined the patient, reviewed the chart and modified the above note which I agree with.   RIZWAN,SAIMA,MD [phone] 03/15/2013, 7:34 PM

## 2013-03-15 NOTE — Progress Notes (Signed)
Bilateral lower extremity venous duplex:  No evidence of DVT, superficial thrombosis, or Baker's Cyst.   

## 2013-03-15 NOTE — Progress Notes (Signed)
ANTICOAGULATION CONSULT NOTE - Follow Up Consult  Pharmacy Consult for Heparin Indication: pulmonary embolus B/L  Allergies  Allergen Reactions  . Ibuprofen Other (See Comments)    Doctor told him he could not take    Patient Measurements: Height: 6' 4.38" (194 cm) Weight: 193 lb 9 oz (87.8 kg) IBW/kg (Calculated) : 87.67 Heparin Dosing Weight: 86.6kg  Vital Signs: Temp: 99.8 F (37.7 C) (08/12 0700) Temp src: Oral (08/12 0700) BP: 154/79 mmHg (08/12 0400)  Labs:  Recent Labs  03/13/13 2335 03/13/13 2344 03/14/13 1200 03/14/13 2029 03/15/13 0357  HGB 14.2 14.3  --   --  12.8*  HCT 39.8 42.0  --   --  36.8*  PLT 290  --   --   --  282  HEPARINUNFRC  --   --  0.90* 0.95* 0.54  CREATININE  --  1.40*  --   --  1.38*  TROPONINI <0.30  --   --   --   --     Estimated Creatinine Clearance: 79.4 ml/min (by C-G formula based on Cr of 1.38).   Medications:  Heparin 1350 units/hr   Assessment: 52yom on heparin for bilateral PE. Heparin drip 1200 uts/hr  level (0.54) at goal. - H/H and Plts wnl - No significant bleeding reported  Goal of Therapy:  Heparin level 0.3-0.7 units/ml Monitor platelets by anticoagulation protocol: Yes   Plan:   Continue heparin drip 1200 uts/hr Daily CBC, HL  Leota Sauers Pharm.D. CPP, BCPS Clinical Pharmacist [phone] 03/15/2013 9:23 AM

## 2013-03-16 LAB — CBC
HCT: 36.2 % — ABNORMAL LOW (ref 39.0–52.0)
Hemoglobin: 12.6 g/dL — ABNORMAL LOW (ref 13.0–17.0)
RBC: 3.95 MIL/uL — ABNORMAL LOW (ref 4.22–5.81)
WBC: 9.4 10*3/uL (ref 4.0–10.5)

## 2013-03-16 LAB — HEPARIN LEVEL (UNFRACTIONATED): Heparin Unfractionated: 0.42 IU/mL (ref 0.30–0.70)

## 2013-03-16 MED ORDER — RIVAROXABAN 20 MG PO TABS: 20.0000 mg | ORAL_TABLET | Freq: Every day | ORAL | Status: DC

## 2013-03-16 MED ORDER — RIVAROXABAN 15 MG PO TABS: 15.0000 mg | ORAL_TABLET | Freq: Two times a day (BID) | ORAL | Status: DC

## 2013-03-16 MED ORDER — RIVAROXABAN 15 MG PO TABS
15.0000 mg | ORAL_TABLET | Freq: Two times a day (BID) | ORAL | Status: DC
  Administered 2013-03-16: 15 mg via ORAL
  Filled 2013-03-16 (×3): qty 1

## 2013-03-16 NOTE — Care Management (Signed)
Case Manager did speak to pt in regards to medication xarelto. [facility] on [address] dose not have medication available. Pt is agreeable to go to [facility] on [address] to pick up medication at the cost of 3.00. No further needs from CM. Gala Lewandowsky, RN,BSN [phone]
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
## 2013-03-16 NOTE — Progress Notes (Addendum)
CSW informed that pt needs transportation assistance. CSW spoke with pt and confirmed no other means of transportation available for dc. CSW provided pt with bus pass. CSW signing off.    Rachelann Enloe, LCSWA [phone]

## 2013-04-25 ENCOUNTER — Ambulatory Visit (INDEPENDENT_AMBULATORY_CARE_PROVIDER_SITE_OTHER): Payer: Medicaid Other | Admitting: Cardiology

## 2013-04-25 ENCOUNTER — Encounter: Payer: Self-pay | Admitting: Cardiology

## 2013-04-25 VITALS — BP 120/82 | HR 59 | Ht 76.5 in | Wt 207.1 lb

## 2013-04-25 DIAGNOSIS — F17200 Nicotine dependence, unspecified, uncomplicated: Secondary | ICD-10-CM

## 2013-04-25 DIAGNOSIS — I2510 Atherosclerotic heart disease of native coronary artery without angina pectoris: Secondary | ICD-10-CM

## 2013-04-25 DIAGNOSIS — K921 Melena: Secondary | ICD-10-CM

## 2013-04-25 DIAGNOSIS — Z9861 Coronary angioplasty status: Secondary | ICD-10-CM

## 2013-04-25 DIAGNOSIS — R001 Bradycardia, unspecified: Secondary | ICD-10-CM

## 2013-04-25 DIAGNOSIS — E785 Hyperlipidemia, unspecified: Secondary | ICD-10-CM

## 2013-04-25 DIAGNOSIS — I2699 Other pulmonary embolism without acute cor pulmonale: Secondary | ICD-10-CM

## 2013-04-25 DIAGNOSIS — I498 Other specified cardiac arrhythmias: Secondary | ICD-10-CM

## 2013-04-25 DIAGNOSIS — Z720 Tobacco use: Secondary | ICD-10-CM

## 2013-04-25 LAB — CBC
HCT: 40.2 % (ref 39.0–52.0)
Hemoglobin: 13.9 g/dL (ref 13.0–17.0)
MCH: 32 pg (ref 26.0–34.0)
MCV: 92.4 fL (ref 78.0–100.0)
Platelets: 287 10*3/uL (ref 150–400)
RBC: 4.35 MIL/uL (ref 4.22–5.81)
WBC: 8 10*3/uL (ref 4.0–10.5)

## 2013-04-25 MED ORDER — HYDROCODONE-ACETAMINOPHEN 5-325 MG PO TABS: 1.0000 | ORAL_TABLET | Freq: Two times a day (BID) | ORAL | Status: DC | PRN

## 2013-04-25 MED ORDER — METOPROLOL TARTRATE 12.5 MG HALF TABLET: 12.5000 mg | ORAL_TABLET | Freq: Two times a day (BID) | ORAL | Status: DC

## 2013-04-25 MED ORDER — ALBUTEROL SULFATE HFA 108 (90 BASE) MCG/ACT IN AERS: 2.0000 | INHALATION_SPRAY | RESPIRATORY_TRACT | Status: DC | PRN

## 2013-04-25 MED ORDER — TICAGRELOR 90 MG PO TABS: 90.0000 mg | ORAL_TABLET | Freq: Once | ORAL | Status: DC

## 2013-04-25 NOTE — Patient Instructions (Addendum)
STOP ASPIRIN Start Metoprolol Tart 12.5 MG  ONE TABLET TWICE A Day Pain med Norco take 1-2 tablets every 12 hours   Your physician has requested that you have an echocardiogram. Echocardiography is a painless test that uses sound waves to create images of your heart. It provides your doctor with information about the size and shape of your heart and how well your heart's chambers and valves are working. This procedure takes approximately one hour. There are no restrictions for this procedure.  Your physician wants you to follow-up in 3 month Dr Gar Gibbon will receive a reminder letter in the mail two months in advance. If you don't receive a letter, please call our office to schedule the follow-up appointment. 3 3 months

## 2013-04-26 NOTE — Assessment & Plan Note (Signed)
He has borderline bradycardiA, but also notes having frequent palpitations. With his coronary history, start low-dose Lopressor 12.5 mg twice a day.

## 2013-04-26 NOTE — Assessment & Plan Note (Signed)
He does continue to smoke. He says he is smoking much less than he had been. He is just not quite sure if he is ready to fully quit. I have talked about Nicoderm patch and Imitrex cigarettes as good options.

## 2013-04-26 NOTE — Progress Notes (Signed)
PCP: Pcp Not In System  Clinic Note: Chief Complaint  Patient presents with  . Post Hospital    Chest pain, has been spitting up blood, sneezing blood, and has noticed blood when he goes to the bathroom. shortness of breath present at rest and on exertion, occasional pain in his legs when walking, occasional swelling on ankles, and lightheadedness.   HPI: Timothy Peck is a 52 y.o. male with a PMH below who presents today for followup after hospitalization for PE.Marland Kitchen He is a 52 year old African American gentleman who I initially met when he presented with a non-ST elevation MI in July 2013. His initial catheterization showed a significant hazy/thrombotic proximal LAD lesion and was treated overnight with Integrilin. We'll Couple days later showed a lesion persisted as a 80-85% residual stenosis. This was treated with a Promus Element DES 4.0 mm 20 mm dose postdilated up to 4.25 mm. He then had a relatively rocky post MI course were he spent some time in the [facility] correctional facilities where became clear that he was not appropriately receiving his dual antiplatelet therapy (DAPT). He then came back to see me in May as having significant left-sided chest pain, I felt it prudent to investigate his coronary anatomy most notably his LAD stent since I was unclear if he had been an uninterrupted DAPT. Thankfully undulate catheterization his LAD stent was wide open. He had some swelling in his arm post radial cath as was evaluated with ultrasound.  He then presented on August 10 with severe left-sided chest pain as well as side pain radiating to his left arm that was worsE with deep inspiration. He ruled out for an MI, but was found to have an elevated d-dimer and says will he is found to have bilateral lower lobe pulmonary emboli on CT angiogram with findings consistent with alveolar hemorrhage and likely pulmonary infarction. He was initiated on heparin initially and then switched to Xarelto which is  currently on now. Lower extremity venous Dopplers did not show clot. No clear-cut etiology such as prolonged travel was identified.  Interval History: He comes in today he still has a lot of soreness in the rib cage on the left side of his chest. He still a little short of breath with intermittent pink sputum. He also notes a few pills of clotted blood in his stool. It is very hard to take a deep breath and is walking around doing things it is hard for debris. He says he braces this side it feels little bit better but he had a lot of coughing when he firsT presented with PE. He says he definitely has dyspnea on exertion. He supposedly is being established with a primary physician at [facility], and is not sure who the provider will be.  He has occasional palpitations but nothing significant. He denies any recent lightheadedness, dizziness or wooziness. No syncope near-syncope. He felt like he may have syncope when he had his initial discomfort prior to presentation. No fevers or chills. No productive cough. No PND orthopnea or edema.  The remainder of Cardiovascular ROS: negative for - loss of consciousness, murmur, orthopnea or rapid heart rate is as follows: Additional cardiac review of systems:  Melena - no, hematochezia and mild streaks of blood when he wipes, and some clots of blood in the stool. Also noted some nosebleeds; hematuria - no; nosebleeds - no; claudication - no  Past Medical History  Diagnosis Date  . Depression   . Degenerative  disc disease, cervical     Dx years ago  . CAD (coronary artery disease) 02/2012    NSTEMI - PCI to prox LAD  . Presence of drug coated stent in LAD coronary artery 02/2012    Promus Element 4.0 mm x 28 mm  . MI (myocardial infarction) 02/02/2012    Normal 2D Echo - EF-60  . Hematoma, s/p cath -rt wrist 12/22/2012  . Chest pain, stable cardiac cath 12/2012 12/22/2012    Prior Cardiac Evaluation and Past Surgical History: Past Surgical  History  Procedure Laterality Date  . Tumor removal      left foot third toe  . Anterior cervical decomp/discectomy fusion  11/28/2011    Procedure: ANTERIOR CERVICAL DECOMPRESSION/DISCECTOMY FUSION 1 LEVEL;  Surgeon: Temple Pacini, MD;  Location: [facility];  Service: Neurosurgery;  Laterality: N/A;  Anterior Cervical Six-Seven Decompression and Fusion  . Cardiac catheterization  01/31/2012    Promeus Element DES 4 x 28, balloon-Emerge monorail  . Cardiac catheterization  12/17/12    patent stent, stable CAD    Allergies  Allergen Reactions  . Ibuprofen Other (See Comments)    Doctor told him he could not take    Current Outpatient Prescriptions  Medication Sig Dispense Refill  . Ascorbic Acid (VITAMIN C) 1000 MG tablet Take 1,000 mg by mouth daily.      Marland Kitchen atorvastatin (LIPITOR) 80 MG tablet Take 80 mg by mouth daily at 6 PM.      . folic acid (FOLVITE) 1 MG tablet Take 1 mg by mouth daily.      . isosorbide mononitrate (IMDUR) 30 MG 24 hr tablet Take 1 tablet (30 mg total) by mouth daily.  90 tablet  3  . lisinopril (PRINIVIL,ZESTRIL) 5 MG tablet Take 5 mg by mouth daily.      . nitroGLYCERIN (NITROSTAT) 0.4 MG SL tablet Place 0.4 mg under the tongue every 5 (five) minutes as needed for chest pain.      . pantoprazole (PROTONIX) 40 MG tablet Take 1 tablet (40 mg total) by mouth daily.  30 tablet  11  . PARoxetine (PAXIL) 10 MG tablet Take 10 mg by mouth every morning.      Marland Kitchen QUEtiapine (SEROQUEL XR) 300 MG 24 hr tablet Take 300 mg by mouth at bedtime.      . Ticagrelor (BRILINTA) 90 MG TABS tablet Take 1 tablet (90 mg total) by mouth once.  30 tablet  11  . traMADol (ULTRAM) 50 MG tablet Take 1 tablet (50 mg total) by mouth every 6 (six) hours as needed for pain.  30 tablet  0  . albuterol (PROVENTIL HFA;VENTOLIN HFA) 108 (90 BASE) MCG/ACT inhaler Inhale 2 puffs into the lungs every 4 (four) hours as needed for wheezing.  1 Inhaler  3   No current facility-administered medications  for this visit.    History   Social History Narrative  . No narrative on file    ROS: A comprehensive Review of Systems - Negative except Pertinent positives above and below General ROS: positive for  - fatigue Respiratory ROS: positive for - cough, pleuritic pain, shortness of breath and Frothy pink sputum did not true hemoptysis. He does note that is very difficult to sleep due to discomfort  PHYSICAL EXAM BP 120/82  Pulse 59  Ht 6' 4.5" (1.943 m)  Wt 207 lb 1.6 oz (93.94 kg)  BMI 24.88 kg/m2 General appearance: alert, cooperative, appears stated age, no distress and Healthy-appearing gentleman. He  does appear to be so uncomfortable and somewhat splinting of the left side. Neck: no adenopathy, no carotid bruit, no JVD, supple, symmetrical, trachea midline and thyroid not enlarged, symmetric, no tenderness/mass/nodules Lungs: Somewhat diminished effort, but essentially clear to auscultation. Diminished breath sounds bilateral bases. Heart: regular rate and rhythm, S1, S2 normal, no murmur, click, rub or gallop and normal apical impulse Abdomen: soft, non-tender; bowel sounds normal; no masses,  no organomegaly Extremities: extremities normal, atraumatic, no cyanosis or edema Pulses: 2+ and symmetric Neurologic: Grossly normal Palpation of the chest wall left side from mid clavicular line all the way to do is lay back around to the spine along the fifth sixth seventh ribs is exquisitely tender.  ZOX:[id number] today:Yes} Rate: 59 , Rhythm: Sinus Bradycardia, Repolarization Abnormality  Recent Labs: None since his hospitalization.  ASSESSMENT / PLAN: Pulmonary embolism and infarction This is a relatively surprising finding for this gentleman who is quite active. He denies any prolonged travel speak. He definitely would need a hypercoagulable workup performed after his son anticoagulation. Completed. Simply based on the widespread distribution and seemed significant infarction  related to pulmonary emboli, Micronase would be to treat him for close to the 9-12 month period and Xarelto. He is up to the 20 mg dose. I gave him a new card.   While on Xarelto, I think just in the setting of having some blood in the stool, having been stopped the aspirin. He still Brilinta for his LAD stent. It is still blood in stool, and next step would be to switch him from Brilinta to Plavix.  Just because he's still so short of breath, and had relatively sizable amount of pulmonary emboli, make sure there's no evidence of any RV strain or elevated pulmonary pressures.  He still has increasing pain mostly pleuritic and probably most skeletal from the coughing during his PE. He's not sleeping well, and I don't want him to have this extra stress.   Plan: 2-D echo  Xarelto for 9-12 months; hold aspirin while on Xarelto;   convert from Brilinta to Plavix if necessary for increased bleeding.  Norco One to 2 Tablets a Day, One-Month Supply with 2 Refills.  CAD S/P percutaneous coronary angioplasty: non-STEMI -Promus DES 4.0 mm 20 mm to prox LAD His anatomy was recently reevaluated the cardiac catheterization, and the LAD stent was widely patent.  While on Brilinta, the initial step will be discontinue aspirin to allow for single coverage now he is beyond one year out. If he still has more bleeding, would switch to Plavix from Brilinta.   Blood in stool, resolved He's had this blood in stool in the past, and certainly now leaving on Xarelto and DAPT, that will be the likely. See above for plan.  Since he does have fatigue elevated his CBC to make sure there is not a significant anemia from this. My suspicion is he simply has a small anal fissure or hemorrhoids.  Bradycardia He has borderline bradycardiA, but also notes having frequent palpitations. With his coronary history, start low-dose Lopressor 12.5 mg twice a day.  Tobacco abuse He does continue to smoke. He says he is smoking  much less than he had been. He is just not quite sure if he is ready to fully quit. I have talked about Nicoderm patch and Imitrex cigarettes as good options.    Dyslipidemia, goal LDL below 70 Lipids from May more quite impressive with HDL 70 LDL 30, triglycerides 52 and total Chol and 113.  We can reduce his atorvastatin down to 40 mg daily. He continued healthy eating discussion.   I recommended that he use nasal saline sprays to moisturize his nose but has not had as much dry clotted blood.  Orders Placed This Encounter  Procedures  . CBC  . EKG 12-Lead  . 2D Echocardiogram without contrast    pulm htn Recent PE    Standing Status: Future     Number of Occurrences:      Standing Expiration Date: 04/25/2014    Order Specific Question:  Type of Echo    Answer:  Complete    Order Specific Question:  Where should this test be performed    Answer:  [facility]    Order Specific Question:  Reason for exam-Echo    Answer:  Other - See Comments Section   Meds ordered this encounter  Medications  . HYDROcodone-acetaminophen (NORCO) 5-325 MG per tablet    Sig: Take 1 tablet by mouth every 12 (twelve) hours as needed for pain.    Dispense:  45 tablet    Refill:  2  . metoprolol tartrate (LOPRESSOR) 12.5 mg TABS tablet    Sig: Take 0.5 tablets (12.5 mg total) by mouth 2 (two) times daily.    Dispense:  60 tablet    Refill:  11  . Ticagrelor (BRILINTA) 90 MG TABS tablet    Sig: Take 1 tablet (90 mg total) by mouth once.    Dispense:  30 tablet    Refill:  11  . albuterol (PROVENTIL HFA;VENTOLIN HFA) 108 (90 BASE) MCG/ACT inhaler    Sig: Inhale 2 puffs into the lungs every 4 (four) hours as needed for wheezing.    Dispense:  1 Inhaler    Refill:  3    Followup: 3 months  Treyven Lafauci W. Herbie Baltimore, M.D., M.S. [facility] [address] [place], [place]  [address]  [phone] Pager # [phone]

## 2013-04-26 NOTE — Assessment & Plan Note (Addendum)
This is a relatively surprising finding for this gentleman who is quite active. He denies any prolonged travel speak. He definitely would need a hypercoagulable workup performed after his son anticoagulation. Completed. Simply based on the widespread distribution and seemed significant infarction related to pulmonary emboli, Micronase would be to treat him for close to the 9-12 month period and Xarelto. He is up to the 20 mg dose. I gave him a new card.   While on Xarelto, I think just in the setting of having some blood in the stool, having been stopped the aspirin. He still Brilinta for his LAD stent. It is still blood in stool, and next step would be to switch him from Brilinta to Plavix.  Just because he's still so short of breath, and had relatively sizable amount of pulmonary emboli, make sure there's no evidence of any RV strain or elevated pulmonary pressures.  He still has increasing pain mostly pleuritic and probably most skeletal from the coughing during his PE. He's not sleeping well, and I don't want him to have this extra stress.   Plan: 2-D echo  Xarelto for 9-12 months; hold aspirin while on Xarelto;   convert from Brilinta to Plavix if necessary for increased bleeding.  Norco One to 2 Tablets a Day, One-Month Supply with 2 Refills.

## 2013-04-26 NOTE — Assessment & Plan Note (Signed)
He's had this blood in stool in the past, and certainly now leaving on Xarelto and DAPT, that will be the likely. See above for plan.  Since he does have fatigue elevated his CBC to make sure there is not a significant anemia from this. My suspicion is he simply has a small anal fissure or hemorrhoids.

## 2013-04-26 NOTE — Assessment & Plan Note (Addendum)
Lipids from May more quite impressive with HDL 70 LDL 30, triglycerides 52 and total Chol and 113.  We can reduce his atorvastatin down to 40 mg daily. He continued healthy eating discussion.

## 2013-04-26 NOTE — Assessment & Plan Note (Signed)
His anatomy was recently reevaluated the cardiac catheterization, and the LAD stent was widely patent.  While on Brilinta, the initial step will be discontinue aspirin to allow for single coverage now he is beyond one year out. If he still has more bleeding, would switch to Plavix from Brilinta.

## 2013-04-27 ENCOUNTER — Telehealth: Payer: Self-pay | Admitting: *Deleted

## 2013-04-27 NOTE — Telephone Encounter (Signed)
Message copied by Tobin Chad on Wed Apr 27, 2013  2:15 PM ------      Message from: [place], DAVID      Created: Mon Apr 25, 2013 10:47 PM       Your blood counts look fine -- no sign of active bleeding       ------

## 2013-04-27 NOTE — Telephone Encounter (Signed)
Spoke to patient. Result given . Verbalized understanding  

## 2013-05-05 ENCOUNTER — Ambulatory Visit (HOSPITAL_COMMUNITY): Payer: Medicaid Other

## 2013-05-05 ENCOUNTER — Telehealth: Payer: Self-pay | Admitting: Cardiology

## 2013-05-05 NOTE — Telephone Encounter (Signed)
Can we take the extra step & contact his PCP's office.   This patient has been established as a patient here before that office & was scheduled to follow-up here after discharge from the hospital.   All they need is that office giving the "ok" for him to see his cardiologist as scheduled.  He was of the understanding that he had met that requirement.  I don't want his to have to wait too long to get that study done - kind of defeats the purpose.  Marykay Lex, MD

## 2013-05-05 NOTE — Telephone Encounter (Signed)
I called [facility] to see if they could make a one time exception for this patient, given that he needs close follow-up. The representative that I spoke with will get an answer from the practice manager and call me back. I'll keep you posted either way Dr. Herbie Baltimore!

## 2013-05-05 NOTE — Telephone Encounter (Signed)
For Echo scheduled 10/2: [facility] PCP is [facility]-Dr. Cloyd Stagers Bonsu. They will not give a referral until patient establishes care, therefore [employer] will not cover this patient's Echo. Attempted to contact the patient several times to notify that he must establish care with his primary but the only number we have in the system does not seem to be good. The patient's appointment is cancelled until we are able to notify and obtain a [facility] [place] [facility] referral.

## 2013-05-05 NOTE — Telephone Encounter (Signed)
Thank you,  Mr. Rinn sometimes these will assistance.  I don't he fully understands how this works, he really needs to call them and try to get approval for having been here at his last visit.  Just we cannot schedule him for followup appointments here are for procedures done here without going through his primary as part of his Medicaid agreement.  He thought he had met the criteria for being able to come here for followup, but I guess he didn't.  Marykay Lex, MD

## 2013-05-05 NOTE — Telephone Encounter (Signed)
I understand completely Dr. Herbie Baltimore. I'll contact that office again and see if I can get any further with them. The office was firm with me the last time I spoke with them, I'll let you know what happens when I call shortly.

## 2013-05-11 NOTE — Telephone Encounter (Signed)
I was finally able to obtain this patient's [facility] referral from the PCP and a scheduler has been notified to reschedule Timothy Peck's Echo ASAP.

## 2013-05-11 NOTE — Telephone Encounter (Signed)
Great, thnx.  Marykay Lex, MD

## 2013-05-17 ENCOUNTER — Telehealth (HOSPITAL_COMMUNITY): Payer: Self-pay | Admitting: *Deleted

## 2013-05-17 NOTE — Telephone Encounter (Signed)
Called patient to r/s echo appt. Insurance issues are still not taken care of yet however pat

## 2013-05-19 ENCOUNTER — Telehealth (HOSPITAL_COMMUNITY): Payer: Self-pay | Admitting: *Deleted

## 2013-05-19 NOTE — Telephone Encounter (Signed)
Pt states that he has been having some issues with bleeding. Pls call. Echo has been scheduled for 05/26/2013

## 2013-05-20 NOTE — Telephone Encounter (Signed)
Spoke to patient.He stated he was having blod in stools again. Started off bright and changed to dark. He stated he stopped taking Brilinta for 4-5 days and restarted it today. Only taking Brilinta once a day. Informed patient will defer to Dr Herbie Baltimore. \ Patient aware and will call him back with any instruction

## 2013-05-20 NOTE — Telephone Encounter (Signed)
Tell him that is OK -- I would prefer just stopping ASA.  What I may well do is change him to Plavix.  But he does need to see his PCP to get referred for GI evaluation & a CBC.  Marykay Lex, MD

## 2013-05-25 ENCOUNTER — Telehealth: Payer: Self-pay | Admitting: *Deleted

## 2013-05-25 NOTE — Telephone Encounter (Signed)
ATTEMPTED TO CALL PATIENT X4 WITH NO ANSWER. SPOKE TO PATIETNT. PER PATIENT ,HE HAS NOT HAD ANYMORE BLEEDING SINCE LAST CONVERSATION. PER DR HARDING, MAY STOP ASPIRIN AND CONTINUE BRILITINA. IF BLEEDING REOCCURS,NEED TO CONTACT PCP FOR A REFERRAL TO GI DOCTOR AND CBC.  PATIENT IS AWARE

## 2013-05-26 ENCOUNTER — Ambulatory Visit (HOSPITAL_COMMUNITY)
Admission: RE | Admit: 2013-05-26 | Discharge: 2013-05-26 | Disposition: A | Discharge status: Home / Self Care | Payer: Medicaid Other | Source: Ambulatory Visit | Attending: Cardiology | Admitting: Cardiology

## 2013-05-26 DIAGNOSIS — I2699 Other pulmonary embolism without acute cor pulmonale: Secondary | ICD-10-CM | POA: Insufficient documentation

## 2013-05-26 DIAGNOSIS — I252 Old myocardial infarction: Secondary | ICD-10-CM

## 2013-05-26 DIAGNOSIS — I2510 Atherosclerotic heart disease of native coronary artery without angina pectoris: Secondary | ICD-10-CM

## 2013-05-26 NOTE — Progress Notes (Signed)
2D Echo Performed 05/26/2013    Ashni Lonzo, RCS  

## 2013-06-14 ENCOUNTER — Telehealth: Payer: Self-pay | Admitting: *Deleted

## 2013-06-14 NOTE — Telephone Encounter (Signed)
Spoke to patient. Result given . Verbalized understanding  

## 2013-06-14 NOTE — Telephone Encounter (Signed)
Message copied by Tobin Chad on Tue Jun 14, 2013  6:06 PM ------      Message from: Herbie Baltimore, DAVID W      Created: Fri Jun 10, 2013  6:01 PM       Overall - fairly normal study.            Good Heart function -- both ventricles.            Marykay Lex, MD       ------

## 2013-07-04 DIAGNOSIS — Z7901 Long term (current) use of anticoagulants: Secondary | ICD-10-CM

## 2013-07-04 HISTORY — DX: Long term (current) use of anticoagulants: Z79.01

## 2013-07-18 ENCOUNTER — Inpatient Hospital Stay (HOSPITAL_COMMUNITY)
Admission: EM | Admit: 2013-07-18 | Discharge: 2013-07-20 | DRG: 282 | Disposition: A | Discharge status: Home / Self Care | Payer: Medicaid Other | Attending: Cardiology | Admitting: Cardiology

## 2013-07-18 ENCOUNTER — Encounter (HOSPITAL_COMMUNITY): Payer: Self-pay | Admitting: Emergency Medicine

## 2013-07-18 ENCOUNTER — Encounter (HOSPITAL_COMMUNITY)
Admission: EM | Disposition: A | Discharge status: Home / Self Care | Payer: Self-pay | Source: Home / Self Care | Attending: Cardiology

## 2013-07-18 ENCOUNTER — Emergency Department (HOSPITAL_COMMUNITY): Payer: Medicaid Other

## 2013-07-18 DIAGNOSIS — J9601 Acute respiratory failure with hypoxia: Secondary | ICD-10-CM

## 2013-07-18 DIAGNOSIS — F17200 Nicotine dependence, unspecified, uncomplicated: Secondary | ICD-10-CM | POA: Diagnosis present

## 2013-07-18 DIAGNOSIS — R0789 Other chest pain: Secondary | ICD-10-CM | POA: Diagnosis present

## 2013-07-18 DIAGNOSIS — I309 Acute pericarditis, unspecified: Principal | ICD-10-CM

## 2013-07-18 DIAGNOSIS — I2510 Atherosclerotic heart disease of native coronary artery without angina pectoris: Secondary | ICD-10-CM | POA: Diagnosis present

## 2013-07-18 DIAGNOSIS — F3289 Other specified depressive episodes: Secondary | ICD-10-CM | POA: Diagnosis present

## 2013-07-18 DIAGNOSIS — Z9861 Coronary angioplasty status: Secondary | ICD-10-CM

## 2013-07-18 DIAGNOSIS — K921 Melena: Secondary | ICD-10-CM

## 2013-07-18 DIAGNOSIS — F1410 Cocaine abuse, uncomplicated: Secondary | ICD-10-CM | POA: Diagnosis present

## 2013-07-18 DIAGNOSIS — R079 Chest pain, unspecified: Secondary | ICD-10-CM | POA: Diagnosis present

## 2013-07-18 DIAGNOSIS — I252 Old myocardial infarction: Secondary | ICD-10-CM

## 2013-07-18 DIAGNOSIS — I2699 Other pulmonary embolism without acute cor pulmonale: Secondary | ICD-10-CM

## 2013-07-18 DIAGNOSIS — I214 Non-ST elevation (NSTEMI) myocardial infarction: Secondary | ICD-10-CM | POA: Diagnosis present

## 2013-07-18 DIAGNOSIS — I319 Disease of pericardium, unspecified: Secondary | ICD-10-CM

## 2013-07-18 DIAGNOSIS — Z8249 Family history of ischemic heart disease and other diseases of the circulatory system: Secondary | ICD-10-CM

## 2013-07-18 DIAGNOSIS — F1210 Cannabis abuse, uncomplicated: Secondary | ICD-10-CM | POA: Diagnosis present

## 2013-07-18 DIAGNOSIS — Z720 Tobacco use: Secondary | ICD-10-CM | POA: Diagnosis present

## 2013-07-18 DIAGNOSIS — F329 Major depressive disorder, single episode, unspecified: Secondary | ICD-10-CM | POA: Diagnosis present

## 2013-07-18 DIAGNOSIS — Z86711 Personal history of pulmonary embolism: Secondary | ICD-10-CM

## 2013-07-18 DIAGNOSIS — E785 Hyperlipidemia, unspecified: Secondary | ICD-10-CM | POA: Diagnosis present

## 2013-07-18 DIAGNOSIS — Z7901 Long term (current) use of anticoagulants: Secondary | ICD-10-CM

## 2013-07-18 HISTORY — DX: Non-ST elevation (NSTEMI) myocardial infarction: I21.4

## 2013-07-18 HISTORY — PX: LEFT HEART CATHETERIZATION WITH CORONARY ANGIOGRAM: SHX5451

## 2013-07-18 HISTORY — DX: Long term (current) use of anticoagulants: Z79.01

## 2013-07-18 LAB — COMPREHENSIVE METABOLIC PANEL
ALT: 31 U/L (ref 0–53)
AST: 43 U/L — ABNORMAL HIGH (ref 0–37)
Alkaline Phosphatase: 46 U/L (ref 39–117)
BUN: 10 mg/dL (ref 6–23)
CO2: 26 mEq/L (ref 19–32)
Chloride: 107 mEq/L (ref 96–112)
GFR calc Af Amer: 74 mL/min — ABNORMAL LOW (ref >90)
GFR calc non Af Amer: 64 mL/min — ABNORMAL LOW (ref >90)
Potassium: 3.8 mEq/L (ref 3.5–5.1)
Sodium: 141 mEq/L (ref 135–145)
Total Bilirubin: 0.3 mg/dL (ref 0.3–1.2)

## 2013-07-18 LAB — CBC WITH DIFFERENTIAL/PLATELET
Basophils Absolute: 0 10*3/uL (ref 0.0–0.1)
Lymphocytes Relative: 22 % (ref 12–46)
Monocytes Relative: 7 % (ref 3–12)
Neutro Abs: 6.3 10*3/uL (ref 1.7–7.7)
Platelets: 279 10*3/uL (ref 150–400)
RBC: 4.55 MIL/uL (ref 4.22–5.81)
RDW: 13.8 % (ref 11.5–15.5)
WBC: 9.2 10*3/uL (ref 4.0–10.5)

## 2013-07-18 LAB — POCT I-STAT TROPONIN I: Troponin i, poc: 2.39 ng/mL (ref 0.00–0.08)

## 2013-07-18 LAB — TROPONIN I: Troponin I: 4.34 ng/mL (ref <0.30)

## 2013-07-18 SURGERY — LEFT HEART CATHETERIZATION WITH CORONARY ANGIOGRAM
Anesthesia: LOCAL

## 2013-07-18 MED ORDER — MIDAZOLAM HCL 2 MG/2ML IJ SOLN
INTRAMUSCULAR | Status: AC
  Filled 2013-07-18: qty 2

## 2013-07-18 MED ORDER — NITROGLYCERIN 0.2 MG/ML ON CALL CATH LAB
INTRAVENOUS | Status: AC
  Filled 2013-07-18: qty 1

## 2013-07-18 MED ORDER — PREDNISONE (PAK) 10 MG PO TABS: 10.0000 mg | ORAL_TABLET | Freq: Four times a day (QID) | ORAL | Status: DC

## 2013-07-18 MED ORDER — PREDNISONE (PAK) 10 MG PO TABS
10.0000 mg | ORAL_TABLET | Freq: Four times a day (QID) | ORAL | Status: DC
  Administered 2013-07-20: 09:00:00 10 mg via ORAL

## 2013-07-18 MED ORDER — QUETIAPINE FUMARATE ER 300 MG PO TB24
300.0000 mg | ORAL_TABLET | Freq: Every day | ORAL | Status: DC
  Administered 2013-07-18 – 2013-07-19 (×2): 300 mg via ORAL
  Filled 2013-07-18 (×3): qty 1

## 2013-07-18 MED ORDER — PREDNISONE (PAK) 10 MG PO TABS
20.0000 mg | ORAL_TABLET | Freq: Every evening | ORAL | Status: AC
  Administered 2013-07-18: 22:00:00 20 mg via ORAL

## 2013-07-18 MED ORDER — PREDNISONE (PAK) 10 MG PO TABS
10.0000 mg | ORAL_TABLET | Freq: Three times a day (TID) | ORAL | Status: AC
  Administered 2013-07-19 (×3): 10 mg via ORAL

## 2013-07-18 MED ORDER — PREDNISONE (PAK) 10 MG PO TABS: 10.0000 mg | ORAL_TABLET | ORAL | Status: DC

## 2013-07-18 MED ORDER — PAROXETINE HCL 10 MG PO TABS
10.0000 mg | ORAL_TABLET | ORAL | Status: DC
  Administered 2013-07-19 – 2013-07-20 (×2): 10 mg via ORAL
  Filled 2013-07-18 (×3): qty 1

## 2013-07-18 MED ORDER — RIVAROXABAN 15 MG PO TABS: 15.0000 mg | ORAL_TABLET | Freq: Two times a day (BID) | ORAL | Status: DC

## 2013-07-18 MED ORDER — HEPARIN SODIUM (PORCINE) 1000 UNIT/ML IJ SOLN
INTRAMUSCULAR | Status: AC
  Filled 2013-07-18: qty 1

## 2013-07-18 MED ORDER — PREDNISONE (PAK) 10 MG PO TABS
20.0000 mg | ORAL_TABLET | Freq: Every evening | ORAL | Status: AC
  Administered 2013-07-19: 20 mg via ORAL

## 2013-07-18 MED ORDER — ISOSORBIDE MONONITRATE ER 30 MG PO TB24
30.0000 mg | ORAL_TABLET | Freq: Every day | ORAL | Status: DC
  Administered 2013-07-18 – 2013-07-19 (×2): 30 mg via ORAL
  Filled 2013-07-18 (×3): qty 1

## 2013-07-18 MED ORDER — PREDNISONE (PAK) 10 MG PO TABS: 20.0000 mg | ORAL_TABLET | Freq: Every evening | ORAL | Status: DC

## 2013-07-18 MED ORDER — ACETAMINOPHEN 325 MG PO TABS
650.0000 mg | ORAL_TABLET | ORAL | Status: DC | PRN
  Administered 2013-07-19: 17:00:00 650 mg via ORAL
  Filled 2013-07-18: qty 2

## 2013-07-18 MED ORDER — NITROGLYCERIN 0.4 MG SL SUBL: 0.4000 mg | SUBLINGUAL_TABLET | SUBLINGUAL | Status: DC | PRN

## 2013-07-18 MED ORDER — FOLIC ACID 1 MG PO TABS
1.0000 mg | ORAL_TABLET | Freq: Every day | ORAL | Status: DC
  Administered 2013-07-18 – 2013-07-20 (×3): 1 mg via ORAL
  Filled 2013-07-18 (×3): qty 1

## 2013-07-18 MED ORDER — TRAMADOL HCL 50 MG PO TABS
50.0000 mg | ORAL_TABLET | Freq: Four times a day (QID) | ORAL | Status: DC | PRN
  Administered 2013-07-19: 13:00:00 50 mg via ORAL
  Filled 2013-07-18 (×2): qty 1

## 2013-07-18 MED ORDER — SODIUM CHLORIDE 0.9 % IV SOLN
INTRAVENOUS | Status: AC
  Administered 2013-07-18: 18:00:00 via INTRAVENOUS

## 2013-07-18 MED ORDER — ATORVASTATIN CALCIUM 80 MG PO TABS
80.0000 mg | ORAL_TABLET | Freq: Every day | ORAL | Status: DC
  Administered 2013-07-18 – 2013-07-19 (×2): 80 mg via ORAL
  Filled 2013-07-18 (×3): qty 1

## 2013-07-18 MED ORDER — ASPIRIN 81 MG PO CHEW
324.0000 mg | CHEWABLE_TABLET | Freq: Once | ORAL | Status: AC
  Administered 2013-07-18: 324 mg via ORAL
  Filled 2013-07-18: qty 4

## 2013-07-18 MED ORDER — VERAPAMIL HCL 2.5 MG/ML IV SOLN
INTRAVENOUS | Status: AC
  Filled 2013-07-18: qty 2

## 2013-07-18 MED ORDER — LISINOPRIL 5 MG PO TABS
5.0000 mg | ORAL_TABLET | Freq: Every day | ORAL | Status: DC
  Administered 2013-07-18 – 2013-07-19 (×2): 5 mg via ORAL
  Filled 2013-07-18 (×2): qty 1

## 2013-07-18 MED ORDER — TICAGRELOR 90 MG PO TABS
90.0000 mg | ORAL_TABLET | Freq: Once | ORAL | Status: AC
  Administered 2013-07-18: 90 mg via ORAL
  Filled 2013-07-18: qty 1

## 2013-07-18 MED ORDER — MOMETASONE FURO-FORMOTEROL FUM 100-5 MCG/ACT IN AERO
2.0000 | INHALATION_SPRAY | Freq: Two times a day (BID) | RESPIRATORY_TRACT | Status: DC | PRN
  Filled 2013-07-18: qty 8.8

## 2013-07-18 MED ORDER — FENTANYL CITRATE 0.05 MG/ML IJ SOLN
INTRAMUSCULAR | Status: AC
  Filled 2013-07-18: qty 2

## 2013-07-18 MED ORDER — PREDNISONE (PAK) 10 MG PO TABS: 10.0000 mg | ORAL_TABLET | ORAL | Status: AC

## 2013-07-18 MED ORDER — PREDNISONE (PAK) 10 MG PO TABS: 10.0000 mg | ORAL_TABLET | Freq: Three times a day (TID) | ORAL | Status: DC

## 2013-07-18 MED ORDER — HEPARIN (PORCINE) IN NACL 2-0.9 UNIT/ML-% IJ SOLN
INTRAMUSCULAR | Status: AC
  Filled 2013-07-18: qty 1500

## 2013-07-18 MED ORDER — METOPROLOL TARTRATE 12.5 MG HALF TABLET
12.5000 mg | ORAL_TABLET | Freq: Two times a day (BID) | ORAL | Status: DC
  Administered 2013-07-19 – 2013-07-20 (×3): 12.5 mg via ORAL
  Filled 2013-07-18 (×6): qty 1

## 2013-07-18 MED ORDER — RIVAROXABAN 20 MG PO TABS
20.0000 mg | ORAL_TABLET | Freq: Every day | ORAL | Status: DC
  Administered 2013-07-18 – 2013-07-19 (×2): 20 mg via ORAL
  Filled 2013-07-18 (×3): qty 1

## 2013-07-18 MED ORDER — PREDNISONE (PAK) 10 MG PO TABS
10.0000 mg | ORAL_TABLET | ORAL | Status: AC
  Administered 2013-07-18: 22:00:00 10 mg via ORAL

## 2013-07-18 MED ORDER — COLCHICINE 0.6 MG PO TABS
0.6000 mg | ORAL_TABLET | Freq: Every day | ORAL | Status: DC
  Administered 2013-07-18 – 2013-07-20 (×3): 0.6 mg via ORAL
  Filled 2013-07-18 (×3): qty 1

## 2013-07-18 MED ORDER — ONDANSETRON HCL 4 MG/2ML IJ SOLN: 4.0000 mg | Freq: Four times a day (QID) | INTRAMUSCULAR | Status: DC | PRN

## 2013-07-18 MED ORDER — LIDOCAINE HCL (PF) 1 % IJ SOLN
INTRAMUSCULAR | Status: AC
  Filled 2013-07-18: qty 30

## 2013-07-18 MED ORDER — PREDNISONE (PAK) 10 MG PO TABS: 20.0000 mg | ORAL_TABLET | Freq: Every morning | ORAL | Status: DC

## 2013-07-18 MED ORDER — PREDNISONE (PAK) 10 MG PO TABS
20.0000 mg | ORAL_TABLET | Freq: Every morning | ORAL | Status: AC
  Administered 2013-07-18: 22:00:00 20 mg via ORAL
  Filled 2013-07-18: qty 21

## 2013-07-18 NOTE — H&P (Signed)
History and Physical Interval Note:  NAME:  Timothy Peck   MRN: [id number] DOB:  04/28/1961   ADMIT DATE: 07/18/2013   07/18/2013 3:49 PM  Timothy Peck is a 52 y.o. male with CAD - PCI to LAD & recent PE this summer (on Brilinta & Xarelto) with h/o MSK CP also evaluated this summer with CATH showing patent stent.   Began noting CP yesterday evening - no relief with H2 blocker or NTG over night.  Sx is persistent. Finally, called EMS this afternoon -- initially EMS called CODE STEMI for what appears to be stable Repolarization abnormality.  STEMI cancelled, & initial history / exam suggested reproducible CP & RUQ pain.  Plan was r/o MI & eval RUQ pain.  Interestingly, the 1st Troponin level was unexpectedly elevated @ ~2.39.  He continues to have discomfort & has not had relief from NTG.  Past Medical History  Diagnosis Date  . Depression   . Degenerative disc disease, cervical     Dx years ago  . CAD (coronary artery disease) 02/2012    NSTEMI - PCI to prox LAD  . Presence of drug coated stent in LAD coronary artery 02/2012    Promus Element 4.0 mm x 28 mm  . MI (myocardial infarction) 02/02/2012    Normal 2D Echo - EF-60  . Hematoma, s/p cath -rt wrist 12/22/2012  . Chest pain, stable cardiac cath 12/2012 12/22/2012   Past Surgical History  Procedure Laterality Date  . Tumor removal      left foot third toe  . Anterior cervical decomp/discectomy fusion  11/28/2011    Procedure: ANTERIOR CERVICAL DECOMPRESSION/DISCECTOMY FUSION 1 LEVEL;  Surgeon: Temple Pacini, MD;  Location: [facility];  Service: Neurosurgery;  Laterality: N/A;  Anterior Cervical Six-Seven Decompression and Fusion  . Cardiac catheterization  01/31/2012    Promeus Element DES 4 x 28, balloon-Emerge monorail  . Cardiac catheterization  12/17/12    patent stent, stable CAD    FAMHx: Family History  Problem Relation Age of Onset  . Anesthesia problems Neg Hx   . Hypotension Neg Hx   . Malignant hyperthermia Neg Hx   .  Pseudochol deficiency Neg Hx   . Coronary artery disease Father     SOCHx:  reports that he has been smoking Cigarettes.  He started smoking about 42 years ago. He has a 10.5 pack-year smoking history. He has never used smokeless tobacco. He reports that he drinks alcohol. He reports that he uses illicit drugs (Cocaine, Marijuana, and Other-see comments).  ALLERGIES: Allergies  Allergen Reactions  . Ibuprofen Other (See Comments)    Doctor told him he could not take    HOME MEDICATIONS: - MEDICATIONS REVIEWED.  (Not in a hospital admission)  PHYSICAL EXAM:Blood pressure 123/86, pulse 55, temperature 97.7 F (36.5 C), temperature source Oral, resp. rate 14, SpO2 97.00%. Pt was see & examined along with Mr. Leron Croak, [place] -- please see full H&P for PE. Gen: pleasant, mild distress. Healthy appearing HEENT: [place]/AT, EOMI Neck: supple, no LAD, JVD, bruit CV: Brady, RR; no M/R/G; normal S1S2 Lungs: CTAB, non-labored; pain worse with deep inspiration & cough Abd: soft; RUQ & epigastric tenderness Ext: no C/C/E Neuro: A&O x 3, CN 2-12 grossly intact  IMPRESSION & PLAN Principal Problem:   NSTEMI (non-ST elevated myocardial infarction) Active Problems:   CAD S/P percutaneous coronary angioplasty: non-STEMI -Promus DES 4.0 mm 20 mm to prox LAD   Pulmonary embolism and infarction   Dyslipidemia,  goal LDL below 70   Chest pain, musculoskeletal   Tobacco abuse   The patients' history has been reviewed, patient examined, no change in status from most recent note, stable for surgery. I have reviewed the patients' chart and labs. Questions were answered to the patient's satisfaction.    TARIS GALINDO has presented today for surgery, with the diagnosis of NSTEMI. The various methods of treatment have been discussed with the patient and family.   Risks / Complications include, but not limited to: Death, MI, CVA/TIA, VF/VT (with defibrillation), Bradycardia (need for temporary pacer placement),  contrast induced nephropathy, bleeding / bruising / hematoma / pseudoaneurysm, vascular or coronary injury (with possible emergent CT or Vascular Surgery), adverse medication reactions, infection.     After consideration of risks, benefits and other options for treatment, the patient has consented to Procedure(s):  LEFT HEART CATHETERIZATION AND CORONARY ANGIOGRAPHY +/- AD HOC PERCUTANEOUS CORONARY INTERVENTION  as a surgical intervention.   We will proceed with the planned procedure.   HARDING,DAVID W [facility] [address] [place], [place]  [address]  [phone]  07/18/2013 3:49 PM

## 2013-07-18 NOTE — ED Notes (Signed)
Per pt and EMS, pt has been having chest pain since last night and taking nitro without relief. sts also some diaphoresis. Pt also received 10 mg morphine in route with minimal relief. Pt having some abdominal pain.

## 2013-07-18 NOTE — H&P (Signed)
Timothy Peck is an 52 y.o. male.   Chief Complaint:  Chest Pain HPI:  Timothy Peck is a 52 y.o. male with a PMH PE, CAD( non-ST elevation MI in July 2013), PCI to proximal LAD,   His initial catheterization showed a significant hazy/thrombotic proximal LAD lesion and was treated overnight with Integrilin. A couple days later showed a lesion persisted as a 80-85% residual stenosis. This was treated with a Promus Element DES 4.0 mm 20 mm dose postdilated up to 4.25 mm. He then had a relatively rocky post MI course were he spent some time in the [facility] correctional facilities where became clear that he was not appropriately receiving his dual antiplatelet therapy (DAPT). He then came back to see Dr. Herbie Baltimore in May as having significant left-sided chest pain, he felt it prudent to investigate his coronary anatomy most notably his LAD stent since it was unclear if he had been an uninterrupted DAPT. His LAD stent was wide open.    He then presented on August 10 with severe left-sided chest pain as well as side pain radiating to his left arm that was worse with deep inspiration. He ruled out for an MI, but was found to have an elevated d-dimer and says will he is found to have bilateral lower lobe pulmonary emboli on CT angiogram with findings consistent with alveolar hemorrhage and likely pulmonary infarction. He was initiated on heparin initially and then switched to Xarelto.  Lower extremity venous Dopplers did not show clot. No clear-cut etiology such as prolonged travel was identified.  The patient presents today with chest pain which started yesterday around 0800hrs.  He reports it as a heaviness, 10/10.  He has been taking nitro consistently since yesterday with no relief.  He said he had some pain in his left arm but not always, Nausea, SOB, diaphoresis.  He noticed some blood in his stool and decided to only take the Xarelto everyother day.  The patient currently denies vomiting, fever, orthopnea, dizziness,  PND, cough, congestion, abdominal pain, melena, lower extremity edema, claudication.   Prior to Admission medications   Medication Sig Start Date End Date Taking? Authorizing Provider  Ascorbic Acid (VITAMIN C) 1000 MG tablet Take 1,000 mg by mouth daily.   Yes Historical Provider, MD  atorvastatin (LIPITOR) 80 MG tablet Take 80 mg by mouth daily at 6 PM. 12/14/12 12/14/13 Yes Marykay Lex, MD  folic acid (FOLVITE) 1 MG tablet Take 1 mg by mouth daily.   Yes Historical Provider, MD  HYDROcodone-acetaminophen (NORCO) 5-325 MG per tablet Take 1 tablet by mouth every 12 (twelve) hours as needed for pain. 04/25/13  Yes Marykay Lex, MD  isosorbide mononitrate (IMDUR) 30 MG 24 hr tablet Take 1 tablet (30 mg total) by mouth daily. 12/14/12  Yes Marykay Lex, MD  lisinopril (PRINIVIL,ZESTRIL) 5 MG tablet Take 5 mg by mouth daily. 12/14/12  Yes Marykay Lex, MD  metoprolol tartrate (LOPRESSOR) 12.5 mg TABS tablet Take 0.5 tablets (12.5 mg total) by mouth 2 (two) times daily. 04/25/13  Yes Marykay Lex, MD  mometasone-formoterol [facility]) 100-5 MCG/ACT AERO Inhale 2 puffs into the lungs 2 (two) times daily as needed for wheezing or shortness of breath.   Yes Historical Provider, MD  nitroGLYCERIN (NITROSTAT) 0.4 MG SL tablet Place 0.4 mg under the tongue every 5 (five) minutes as needed for chest pain.   Yes Historical Provider, MD  PARoxetine (PAXIL) 10 MG tablet Take 10 mg by mouth  every morning.   Yes Historical Provider, MD  QUEtiapine (SEROQUEL XR) 300 MG 24 hr tablet Take 300 mg by mouth at bedtime.   Yes Historical Provider, MD  Rivaroxaban (XARELTO) 15 MG TABS tablet Take 15 mg by mouth 2 (two) times daily with a meal.   Yes Historical Provider, MD  Ticagrelor (BRILINTA) 90 MG TABS tablet Take 1 tablet (90 mg total) by mouth once. 04/25/13  Yes Marykay Lex, MD  traMADol (ULTRAM) 50 MG tablet Take 1 tablet (50 mg total) by mouth every 6 (six) hours as needed for pain. 12/22/12  Yes Nada Boozer, NP     Past Medical History  Diagnosis Date  . Depression   . Degenerative disc disease, cervical     Dx years ago  . CAD (coronary artery disease) 02/2012    NSTEMI - PCI to prox LAD  . Presence of drug coated stent in LAD coronary artery 02/2012    Promus Element 4.0 mm x 28 mm  . MI (myocardial infarction) 02/02/2012    Normal 2D Echo - EF-60  . Hematoma, s/p cath -rt wrist 12/22/2012  . Chest pain, stable cardiac cath 12/2012 12/22/2012    Past Surgical History  Procedure Laterality Date  . Tumor removal      left foot third toe  . Anterior cervical decomp/discectomy fusion  11/28/2011    Procedure: ANTERIOR CERVICAL DECOMPRESSION/DISCECTOMY FUSION 1 LEVEL;  Surgeon: Temple Pacini, MD;  Location: [facility];  Service: Neurosurgery;  Laterality: N/A;  Anterior Cervical Six-Seven Decompression and Fusion  . Cardiac catheterization  01/31/2012    Promeus Element DES 4 x 28, balloon-Emerge monorail  . Cardiac catheterization  12/17/12    patent stent, stable CAD    Family History  Problem Relation Age of Onset  . Anesthesia problems Neg Hx   . Hypotension Neg Hx   . Malignant hyperthermia Neg Hx   . Pseudochol deficiency Neg Hx   . Coronary artery disease Father    Social History:  reports that he has been smoking Cigarettes.  He started smoking about 42 years ago. He has a 10.5 pack-year smoking history. He has never used smokeless tobacco. He reports that he drinks alcohol. He reports that he uses illicit drugs (Cocaine, Marijuana, and Other-see comments).  Allergies:  Allergies  Allergen Reactions  . Ibuprofen Other (See Comments)    Doctor told him he could not take     (Not in a hospital admission)  Results for orders placed during the hospital encounter of 07/18/13 (from the past 48 hour(s))  CBC WITH DIFFERENTIAL     Status: None   Collection Time    07/18/13  2:45 PM      Result Value Range   WBC 9.2  4.0 - 10.5 K/uL   RBC 4.55  4.22 - 5.81 MIL/uL    Hemoglobin 14.7  13.0 - 17.0 g/dL   HCT 45.4  09.8 - 11.9 %   MCV 91.6  78.0 - 100.0 fL   MCH 32.3  26.0 - 34.0 pg   MCHC 35.3  30.0 - 36.0 g/dL   RDW 14.7  82.9 - 56.2 %   Platelets 279  150 - 400 K/uL   Neutrophils Relative % 68  43 - 77 %   Neutro Abs 6.3  1.7 - 7.7 K/uL   Lymphocytes Relative 22  12 - 46 %   Lymphs Abs 2.0  0.7 - 4.0 K/uL   Monocytes Relative 7  3 - 12 %   Monocytes Absolute 0.6  0.1 - 1.0 K/uL   Eosinophils Relative 3  0 - 5 %   Eosinophils Absolute 0.3  0.0 - 0.7 K/uL   Basophils Relative 0  0 - 1 %   Basophils Absolute 0.0  0.0 - 0.1 K/uL  COMPREHENSIVE METABOLIC PANEL     Status: Abnormal   Collection Time    07/18/13  2:45 PM      Result Value Range   Sodium 141  135 - 145 mEq/L   Potassium 3.8  3.5 - 5.1 mEq/L   Chloride 107  96 - 112 mEq/L   CO2 26  19 - 32 mEq/L   Glucose, Bld 94  70 - 99 mg/dL   BUN 10  6 - 23 mg/dL   Creatinine, Ser 1.61  0.50 - 1.35 mg/dL   Calcium 8.4  8.4 - 09.6 mg/dL   Total Protein 6.0  6.0 - 8.3 g/dL   Albumin 3.3 (*) 3.5 - 5.2 g/dL   AST 43 (*) 0 - 37 U/L   ALT 31  0 - 53 U/L   Alkaline Phosphatase 46  39 - 117 U/L   Total Bilirubin 0.3  0.3 - 1.2 mg/dL   GFR calc non Af Amer 64 (*) >90 mL/min   GFR calc Af Amer 74 (*) >90 mL/min   Comment: (NOTE)     The eGFR has been calculated using the CKD EPI equation.     This calculation has not been validated in all clinical situations.     eGFR's persistently <90 mL/min signify possible Chronic Kidney     Disease.  LIPASE, BLOOD     Status: None   Collection Time    07/18/13  2:45 PM      Result Value Range   Lipase 27  11 - 59 U/L  POCT I-STAT TROPONIN I     Status: Abnormal   Collection Time    07/18/13  3:02 PM      Result Value Range   Troponin i, poc 2.39 (*) 0.00 - 0.08 ng/mL   Comment NOTIFIED PHYSICIAN     Comment 3            Comment: Due to the release kinetics of cTnI,     a negative result within the first hours     of the onset of symptoms does not  rule out     myocardial infarction with certainty.     If myocardial infarction is still suspected,     repeat the test at appropriate intervals.   Dg Chest 2 View  07/18/2013   CLINICAL DATA:  Heaviness in the chest. Bilateral upper abdominal pain.  EXAM: CHEST  2 VIEW  COMPARISON:  03/13/2013.  FINDINGS: The heart size and mediastinal contours are within normal limits. Both lungs are clear. The visualized skeletal structures are unremarkable. Prior cervical fusion.  IMPRESSION: No active cardiopulmonary disease.   Electronically Signed   By: Davonna Belling M.D.   On: 07/18/2013 15:29    Review of Systems  Constitutional: Positive for diaphoresis. Negative for fever.  HENT: Negative for congestion and sore throat.   Respiratory: Positive for shortness of breath. Negative for cough.   Cardiovascular: Positive for chest pain. Negative for orthopnea, leg swelling and PND.  Gastrointestinal: Positive for nausea and blood in stool. Negative for vomiting, abdominal pain and melena.  Genitourinary: Positive for hematuria (by UA at PCP).  Musculoskeletal: Positive for myalgias (  left arm).  Neurological: Negative for dizziness.  All other systems reviewed and are negative.    Blood pressure 123/86, pulse 55, temperature 97.7 F (36.5 C), temperature source Oral, resp. rate 14, SpO2 97.00%. Physical Exam  Nursing note and vitals reviewed. Constitutional: He is oriented to person, place, and time. He appears well-developed and well-nourished. No distress.  HENT:  Head: Normocephalic and atraumatic.  Mouth/Throat: Oropharynx is clear and moist. No oropharyngeal exudate.  Eyes: EOM are normal. Pupils are equal, round, and reactive to light. No scleral icterus.  Neck: Normal range of motion. Neck supple. No JVD present.  Cardiovascular: Normal rate, regular rhythm, S1 normal and S2 normal.   No murmur heard. Pulses:      Radial pulses are 2+ on the right side, and 2+ on the left side.        Dorsalis pedis pulses are 2+ on the right side, and 2+ on the left side.  No carotid Bruits  Respiratory: Effort normal and breath sounds normal. He has no wheezes. He has no rales.  GI: Soft. Bowel sounds are normal. There is tenderness (EPI gastric and RLQ).  Musculoskeletal: He exhibits no edema.  Lymphadenopathy:    He has no cervical adenopathy.  Neurological: He is alert and oriented to person, place, and time. He exhibits normal muscle tone.  Skin: Skin is warm and dry.  Psychiatric: He has a normal mood and affect.     Assessment/Plan Principal Problem:   NSTEMI (non-ST elevated myocardial infarction) Active Problems:   Tobacco abuse   CAD S/P percutaneous coronary angioplasty: non-STEMI -Promus DES 4.0 mm 20 mm to prox LAD   Dyslipidemia, goal LDL below 70   Pulmonary embolism and infarction   Chest pain, musculoskeletal  Plan:  The patient's CP is clearly reproducible and not relieved with NTG.  Surprisingly his troponin is elevated at 2.39.  He was started on IV heparin and will be going to the cath lab for [facility].  During exam he was found to be severely tender in the epigastric and RLQ.  Will order a Abd [place].   Given the sporadic nature taking Xarelto, I will check a D Dimer.  HAGER, BRYAN 07/18/2013, 3:41 PM   Unexpectedly - ruled in for NSTEMI.   Will take to cath for [facility] PCI.    Marykay Lex, M.D., M.S. [facility] [address] [place], [place]  [address]  [phone] Pager # [phone] 07/18/2013 4:19 PM

## 2013-07-18 NOTE — ED Notes (Signed)
NOTIFIED PHYSICIAN OF PATIENTS PANIC LAB RESULTS OF I-STAT TROPONIN = 2.39ng/ml , @15 ;18 pm , 07/18/2013.

## 2013-07-18 NOTE — ED Notes (Signed)
Cath lab ready for pt, room 9

## 2013-07-18 NOTE — ED Provider Notes (Signed)
CSN: [id number]     Arrival date & time 07/18/13  1404 History   First MD Initiated Contact with Patient 07/18/13 1405     Chief Complaint  Patient presents with  . Chest Pain   (Consider location/radiation/quality/duration/timing/severity/associated sxs/prior Treatment) HPI Comments: Patient with a history of CAD s/p stents in 2013  presents with a chief complaint of chest pain.  He reports that the chest pain has been constant since 8:30 PM last evening.  He reports that the pain feels like "something sitting on my chest."  He reports that the pain radiates down his left arm.  He has taken Nitroglycerin for the pain without relief. He was also given 10 mg Morphine by EMS en route to the ED, which he feels has helped somewhat.  He reports that he is having some associated SOB, but states that he is always short of breath.  He denies any numbness or tingling.  Denies cough, fever, or chills.  Denies any lower extremity edema.  Patient also has a history of PE that was diagnosed in August of 2014.  He is currently on Xarelto.  He reports that he has been taking his medication every other day, although he is supposed to be taking the medication daily.  He also has a history of Cocaine use.  He reports that he has not used Cocaine recently.  Although he states that he smoked Marijuana 3-4 days ago, which he thinks could have been laced with Cocaine.  Patient is followed by Cardiology.  He was recently recathed in May 2014 which showed no in stent restenosis and wide open arteries.    Patient also complaining of generalized abdominal pain that has been present intermittently over the past month.  He reports that nothing in particular brings on the pain.  He denies any nausea, vomiting, diarrhea, constipation, fever, or chills.    The history is provided by the patient.    Past Medical History  Diagnosis Date  . Depression   . Degenerative disc disease, cervical     Dx years ago  . CAD (coronary  artery disease) 02/2012    NSTEMI - PCI to prox LAD  . Presence of drug coated stent in LAD coronary artery 02/2012    Promus Element 4.0 mm x 28 mm  . MI (myocardial infarction) 02/02/2012    Normal 2D Echo - EF-60  . Hematoma, s/p cath -rt wrist 12/22/2012  . Chest pain, stable cardiac cath 12/2012 12/22/2012   Past Surgical History  Procedure Laterality Date  . Tumor removal      left foot third toe  . Anterior cervical decomp/discectomy fusion  11/28/2011    Procedure: ANTERIOR CERVICAL DECOMPRESSION/DISCECTOMY FUSION 1 LEVEL;  Surgeon: Temple Pacini, MD;  Location: [facility];  Service: Neurosurgery;  Laterality: N/A;  Anterior Cervical Six-Seven Decompression and Fusion  . Cardiac catheterization  01/31/2012    Promeus Element DES 4 x 28, balloon-Emerge monorail  . Cardiac catheterization  12/17/12    patent stent, stable CAD   Family History  Problem Relation Age of Onset  . Anesthesia problems Neg Hx   . Hypotension Neg Hx   . Malignant hyperthermia Neg Hx   . Pseudochol deficiency Neg Hx   . Coronary artery disease Father    History  Substance Use Topics  . Smoking status: Current Every Day Smoker -- 0.25 packs/day for 42 years    Types: Cigarettes    Start date: 01/12/1971  . Smokeless tobacco:  Never Used  . Alcohol Use: Yes     Comment: Report being abstinent since ~Sept 2013    Review of Systems  All other systems reviewed and are negative.    Allergies  Ibuprofen  Home Medications   Current Outpatient Rx  Name  Route  Sig  Dispense  Refill  . Ascorbic Acid (VITAMIN C) 1000 MG tablet   Oral   Take 1,000 mg by mouth daily.         Marland Kitchen atorvastatin (LIPITOR) 80 MG tablet   Oral   Take 80 mg by mouth daily at 6 PM.         . folic acid (FOLVITE) 1 MG tablet   Oral   Take 1 mg by mouth daily.         Marland Kitchen HYDROcodone-acetaminophen (NORCO) 5-325 MG per tablet   Oral   Take 1 tablet by mouth every 12 (twelve) hours as needed for pain.   45 tablet   2    . isosorbide mononitrate (IMDUR) 30 MG 24 hr tablet   Oral   Take 1 tablet (30 mg total) by mouth daily.   90 tablet   3   . lisinopril (PRINIVIL,ZESTRIL) 5 MG tablet   Oral   Take 5 mg by mouth daily.         . metoprolol tartrate (LOPRESSOR) 12.5 mg TABS tablet   Oral   Take 0.5 tablets (12.5 mg total) by mouth 2 (two) times daily.   60 tablet   11   . mometasone-formoterol (DULERA) 100-5 MCG/ACT AERO   Inhalation   Inhale 2 puffs into the lungs 2 (two) times daily as needed for wheezing or shortness of breath.         . nitroGLYCERIN (NITROSTAT) 0.4 MG SL tablet   Sublingual   Place 0.4 mg under the tongue every 5 (five) minutes as needed for chest pain.         Marland Kitchen PARoxetine (PAXIL) 10 MG tablet   Oral   Take 10 mg by mouth every morning.         Marland Kitchen QUEtiapine (SEROQUEL XR) 300 MG 24 hr tablet   Oral   Take 300 mg by mouth at bedtime.         . Rivaroxaban (XARELTO) 15 MG TABS tablet   Oral   Take 15 mg by mouth 2 (two) times daily with a meal.         . Ticagrelor (BRILINTA) 90 MG TABS tablet   Oral   Take 1 tablet (90 mg total) by mouth once.   30 tablet   11   . traMADol (ULTRAM) 50 MG tablet   Oral   Take 1 tablet (50 mg total) by mouth every 6 (six) hours as needed for pain.   30 tablet   0    BP 123/86  Pulse 55  Temp(Src) 97.7 F (36.5 C) (Oral)  Resp 14  SpO2 97% Physical Exam  Nursing note and vitals reviewed. Constitutional: He appears well-developed and well-nourished.  HENT:  Head: Normocephalic and atraumatic.  Mouth/Throat: Oropharynx is clear and moist.  Neck: Normal range of motion. Neck supple.  Cardiovascular: Normal rate, regular rhythm, normal heart sounds and intact distal pulses.   Pulmonary/Chest: Effort normal and breath sounds normal. He exhibits tenderness.  Abdominal: Soft. Bowel sounds are normal. He exhibits no distension and no mass. There is generalized tenderness. There is no rebound and no guarding.   Musculoskeletal: Normal range of  motion.  No LE edema bilaterally  Neurological: He is alert.  Skin: Skin is warm and dry.  Psychiatric: He has a normal mood and affect.    ED Course  Procedures (including critical care time) Labs Review Labs Reviewed  CBC WITH DIFFERENTIAL  COMPREHENSIVE METABOLIC PANEL  LIPASE, BLOOD  URINALYSIS, ROUTINE W REFLEX MICROSCOPIC   Imaging Review No results found.  EKG Interpretation    Date/Time:  Monday July 18 2013 14:14:19 EST Ventricular Rate:  55 PR Interval:  171 QRS Duration: 91 QT Interval:  449 QTC Calculation: 429 R Axis:   76 Text Interpretation:  Sinus rhythm ST elev, probable normal early repol pattern No significant change since last tracing Confirmed by Denton Lank  MD, KEVIN ([id number]) on 07/18/2013 2:42:13 PM           Patient evaluated by Dr. Herbie Baltimore with Cardiology immediately upon arrival in the ED.  Cardiology recommends that the patient have a medical admit under observation.  He recommends chest pain rule out.  3:30 PM Discussed with Trish with Cardiology.  She is made aware of the elevated Troponin.  3:35 PM Patient evaluated by Dr. Herbie Baltimore.  He recommends starting the patient on Heparin IV and patient will be admitted by Cardiology.   MDM  No diagnosis found. Patient with a history of CAD s/p stents presents today with chest pain.  Chest pain has been present since 8:30 PM last evening.  No ischemic changes on EKG.  Initial troponin found to be elevated at 2.39.  Cardiology immediately notified.  Patient started on Heparin IV.  Patient admitted to Cardiology for further management.    Santiago Glad, PA-C 07/18/13 [phone]

## 2013-07-18 NOTE — CV Procedure (Signed)
CARDIAC CATHETERIZATION REPORT  NAME:  Timothy Peck   MRN: [id number] DOB:  15-Nov-1960   ADMIT DATE: 07/18/2013 Procedure Date: 07/18/2013  INTERVENTIONAL CARDIOLOGIST: Marykay Lex, M.D., MS PRIMARY CARE PROVIDER: Pcp Not In System PRIMARY CARDIOLOGIST: Marykay Lex, MD, MS  PATIENT:  Timothy Peck is a 52 y.o. male with CAD - PCI to LAD (Promus DES 4.0 mm x 28 mm) & recent PE this summer (on Brilinta & Xarelto) with h/o MSK CP also evaluated this summer with CATH showing patent stent.  Began noting CP yesterday evening - no relief with H2 blocker or NTG over night. Sx is persistent. Finally, called EMS this afternoon -- initially EMS called CODE STEMI for what appears to be stable Repolarization abnormality. STEMI cancelled, & initial history / exam suggested reproducible CP & RUQ pain. Plan was r/o MI & eval RUQ pain. Interestingly, the 1st Troponin level was unexpectedly elevated @ ~2.39. He continues to have discomfort & has not had relief from NTG. With + Troponin levels & ongoing chest pain, he is referred for LHC/Angio +/- PCI.  PRE-OPERATIVE DIAGNOSIS:    NSTEMI  Known CAD - AUC = Appropriate  PROCEDURES PERFORMED:    LEFT HEART CATHETERIZATION WITH CORONARY ANGIOGRAPHY  PROCEDURE:Consent:  Risks of procedure as well as the alternatives and risks of each were explained to the (patient/caregiver).  Consent for procedure obtained. Consent for signed by MD and patient with RN witness -- placed on chart.   PROCEDURE: The patient was brought to the 2nd Floor [facility] in the fasting state and prepped and draped in the usual sterile fashion for Right groin or radial access. A modified Allen's test with plethysmography was performed, revealing excellent Ulnar artery collateral flow.  Sterile technique was used including antiseptics, cap, gloves, gown, hand hygiene, mask and sheet.  Skin prep: Chlorhexidine.  Time Out: Verified patient  identification, verified procedure, site/side was marked, verified correct patient position, special equipment/implants available, medications/allergies/relevent history reviewed, required imaging and test results available.  Performed  Access: RIGHT RADIAL Artery; 6 Fr Sheath -- Seldinger technique (Angiocath Micropuncture Kit)  IA Radial Cocktail - 10 ml , IV Heparin 5000 Units Diagnostic:  TIG 4.0,JR 4, catheters advanced & exchanged over long-exchange safety-J wire  Left Coronary Artery Angiography: TIG 4.0  Right Coronary Artery Angiography: JR4  LV Hemodynamics (LV Gram): JR4  TR Band:  1510 Hours, 9 mL air  MEDICATIONS:  Anesthesia:  Local Lidocaine 2 ml  Sedation:  2 mg IV Versed, 50 mcg IV fentanyl ;   Omnipaque Contrast: 60 ml  Anticoagulation:  IV Heparin 5000 Units   Anti-Platelet Agent:  On Brilinta  Hemodynamics:  Central Aortic Pressure  (Mean) : 124/77 mmHg (97 mmHg)  Left Ventricular Pressure (EDP): 130/12 mmHg (18 mmHg);   Left Ventriculography:  EF: 55-60 %  Wall Motion: normal  Coronary Anatomy: No change from 12/17/2012  Left Main: large caliber, trifurcates into LAD, Circumflex & Ramus Intermedius. LAD: Large caliber vessel with widely patent DES stent and brisk TIMI 3 flow throughout the remainder of the vessel. There are 2 small caliber D1 & D2 branches that are free of disease. The jailed SP1 has a residual 30-40% ostial stenosis that is improved from the immediate post PCI ~90% & 70% in 12/2012.   Left Circumflex: Large caliber vessel with a Small OM1 proximally that bifurcates into a moderate to large OM2 and the AV Groove Circumflex that bifurcates into OM3 and a small  LPL branch. Minimal luminal irregularities.   Ramus intermedius: Moderate to large caliber vessel that courses as a "High OM1"; minimal luminal irregularities.   RCA: large caliber, dominant vessel with a proximal ~20% stenosis (with catheter induced "kinking" at the slight  "sherpherd's crook" bend. There is a small to moderate (but major) RV marginal branch that arises early in the mid-vessel. The RCA bifurcates just after the inferior bend into the Right Posterior Descending Artery and the Right Posterior AV Groove Branch (RPAV); no angiographically significant disease.  RPDA: moderate caliber vessel that reaches almost to the apex, minimal luminal irregularities.  RPL Sysytem:The RPAV begins as a moderate caliber vessel that bifurcates into two small to moderate caliber Right Posterolateral Branches; minimal luminal irregularities   PATIENT DISPOSITION:    The patient was transferred to the PACU holding area in a hemodynamicaly stable, chest pain free condition.  The patient tolerated the procedure well, and there were no complications.  EBL:   < 5 ml  The patient was stable before, during, and after the procedure.  POST-OPERATIVE DIAGNOSIS:    No angiographic evidence of progression of CAD with widely patent proximal LAD DES.   Persistent slow distal flow due to large caliber vessels & small caliber diagnostic catheter  Preserved EF with mild elevation of LVEDP  Most likely etiology of CP / Troponin elevation is Myo-pericarditis because the coronary arteries are quite large in diameter & would require significant spasm to cause prolonged pain & MI.  PLAN OF CARE:  Admit to 6C post procedure unit for post radial cath care.  Will initiate Steroid taper + Colchicine in order to avoid use of NSAIDS in setting of on-going Brilinta & Xarelto,   Check 2D Echo in AM to assess for pericardial effusion.  Analgesics for pain relief.  Norco worked well in past.   Marykay Lex, M.D., M.S. [facility] [address] [place], [place]  [address]  [phone]  07/18/2013 5:14 PM

## 2013-07-18 NOTE — Progress Notes (Signed)
TR BAND REMOVAL  LOCATION:    right radial  DEFLATED PER PROTOCOL:    yes  TIME BAND OFF / DRESSING APPLIED:    1930   SITE UPON ARRIVAL:    Level 0  SITE AFTER BAND REMOVAL:    Level 0  REVERSE ALLEN'S TEST:     positive  CIRCULATION SENSATION AND MOVEMENT:    Within Normal Limits   yes  COMMENTS:   Tolerated procedure well

## 2013-07-18 NOTE — ED Notes (Signed)
Pt transported to xray 

## 2013-07-18 NOTE — Brief Op Note (Signed)
07/18/2013  5:14 PM  PATIENT:  Timothy Peck  52 y.o. male with prior NSTEMI - PCI of LAD with DES, PE & h/o ? Pericarditis p/w prolonged L side CP --> ruled in with + Troponin c/w NSTEMI.  Referred for cardiac cath.  PRE-OPERATIVE DIAGNOSIS:  NSTEM  POST-OPERATIVE DIAGNOSIS:    Widely patent LAD stent - very large vessels; no significant residual CAD  Well preserved LVEF with normal LV Pressures : 130/12 mmHg (18 mmHg); AoP 124/77 mmHg (97 mmHg)  Non CAD related Troponin Elevation - ? Myo-Pericarditis  PROCEDURE PERFORMED:  Procedure(s): LEFT HEART CATHETERIZATION WITH CORONARY ANGIOGRAM (N/A)  SURGEON:  Surgeon(s) and Role:    * Marykay Lex, MD - Primary  ANESTHESIA:   local; IV Fentanyl & Versed (50 mcg, 2 mg)  EBL:    < 5 ml  BLOOD ADMINISTERED:none  PROCEDURE: 6 Fr R Radial A Access with angiocath Micropuncture (IA Radial Cocktail 10 ml), IV Heparin 5000 Units); TIG 4.0 Catheter - LCA Angiography, exchanged over long-exchanged saftety J wire --> JR 4 catheter for LV hemodynamics & LV Gram, then RCA Angio.    LOCAL MEDICATIONS USED:  LIDOCAINE   SPECIMEN:  No Specimen  TR BAND:  1710 hrs 14 ml air.  DICTATION: .Note written in EPIC  PLAN OF CARE: Admit for overnight observation; Colchicine & Steroids for possible Myopericarditis Check 2 D Echo for ? Effusion; Norco / Morphine for pain relief (avoiding NSAIDS with recent "GI Bleeding"  PATIENT DISPOSITION:  PACU - hemodynamically stable. Still with Pain.  Marykay Lex, M.D., M.S. [facility] [address] [place], [place]  [address]  [phone] Pager # [phone] 07/18/2013 5:21 PM

## 2013-07-18 NOTE — Progress Notes (Signed)
ANTICOAGULATION CONSULT NOTE - Initial Consult  Pharmacy Consult for Xarelto Indication: History of PE  Allergies  Allergen Reactions  . Ibuprofen Other (See Comments)    Doctor told him he could not take    Patient Measurements: Height: 6' 4.38" (194 cm) Weight: 207 lb 0.2 oz (93.9 kg) IBW/kg (Calculated) : 87.67 Heparin Dosing Weight:   Vital Signs: Temp: 97.9 F (36.6 C) (12/15 2000) Temp src: Oral (12/15 2000) BP: 129/84 mmHg (12/15 2000) Pulse Rate: 49 (12/15 2000)  Labs:  Recent Labs  07/18/13 1445 07/18/13 1840  HGB 14.7  --   HCT 41.7  --   PLT 279  --   CREATININE 1.28  --   TROPONINI  --  4.34*    Estimated Creatinine Clearance: 85.6 ml/min (by C-G formula based on Cr of 1.28).   Medical History: Past Medical History  Diagnosis Date  . Depression   . Degenerative disc disease, cervical     Dx years ago  . CAD (coronary artery disease) 02/2012    NSTEMI - PCI to prox LAD  . Presence of drug coated stent in LAD coronary artery 02/2012    Promus Element 4.0 mm x 28 mm  . MI (myocardial infarction) 02/02/2012    Normal 2D Echo - EF-60  . Hematoma, s/p cath -rt wrist 12/22/2012  . Chest pain, stable cardiac cath 12/2012 12/22/2012    Medications:  Scheduled:  . atorvastatin  80 mg Oral q1800  . colchicine  0.6 mg Oral Daily  . folic acid  1 mg Oral Daily  . isosorbide mononitrate  30 mg Oral Daily  . lisinopril  5 mg Oral Daily  . metoprolol tartrate  12.5 mg Oral BID  . [START ON 07/19/2013] PARoxetine  10 mg Oral BH-q7a  . predniSONE  10 mg Oral PC lunch  . [START ON 07/19/2013] predniSONE  10 mg Oral 3 x daily with food  . [START ON 07/20/2013] predniSONE  10 mg Oral 4X daily taper  . [START ON 07/19/2013] predniSONE  20 mg Oral Nightly  . QUEtiapine  300 mg Oral QHS  . rivaroxaban  20 mg Oral Q supper    Assessment: 52 yr old male admitted with chest pain. He had PCI last summer and a PE that was being treated with Brilinta and Xarelto.  However, his Xarelto dose was 15 mg bid and it should have been switched to 20 mg QD after the first 3 weeks. He went for a cath today and the MD ordered Xarelto per Rx. Heparin was also ordered per pharmacy. A call was made to Dr Herbie Baltimore and he d/c'd the heparin and wanted the Xarelto changed to 20 mg qd. Pt is an overnight observation at this time. Goal of Therapy:    Plan:  Xarelto 20 mg once a day with evening meal.  Eugene Garnet 07/18/2013,11:07 PM

## 2013-07-18 NOTE — ED Notes (Signed)
Consent form signed.

## 2013-07-19 ENCOUNTER — Inpatient Hospital Stay (HOSPITAL_COMMUNITY): Payer: Medicaid Other

## 2013-07-19 ENCOUNTER — Encounter (HOSPITAL_COMMUNITY): Payer: Self-pay | Admitting: Cardiology

## 2013-07-19 DIAGNOSIS — K921 Melena: Secondary | ICD-10-CM

## 2013-07-19 DIAGNOSIS — I2699 Other pulmonary embolism without acute cor pulmonale: Secondary | ICD-10-CM

## 2013-07-19 DIAGNOSIS — I059 Rheumatic mitral valve disease, unspecified: Secondary | ICD-10-CM

## 2013-07-19 DIAGNOSIS — I309 Acute pericarditis, unspecified: Principal | ICD-10-CM

## 2013-07-19 DIAGNOSIS — Z7901 Long term (current) use of anticoagulants: Secondary | ICD-10-CM

## 2013-07-19 DIAGNOSIS — E785 Hyperlipidemia, unspecified: Secondary | ICD-10-CM

## 2013-07-19 DIAGNOSIS — I214 Non-ST elevation (NSTEMI) myocardial infarction: Secondary | ICD-10-CM

## 2013-07-19 DIAGNOSIS — Z9861 Coronary angioplasty status: Secondary | ICD-10-CM

## 2013-07-19 DIAGNOSIS — R0789 Other chest pain: Secondary | ICD-10-CM

## 2013-07-19 LAB — URINE MICROSCOPIC-ADD ON

## 2013-07-19 LAB — CBC
HCT: 40.8 % (ref 39.0–52.0)
Hemoglobin: 14.3 g/dL (ref 13.0–17.0)
MCV: 91.1 fL (ref 78.0–100.0)
RDW: 13.7 % (ref 11.5–15.5)
WBC: 10.8 10*3/uL — ABNORMAL HIGH (ref 4.0–10.5)

## 2013-07-19 LAB — LIPID PANEL
Cholesterol: 159 mg/dL (ref 0–200)
HDL: 55 mg/dL (ref >39)
LDL Cholesterol: 94 mg/dL (ref 0–99)
Triglycerides: 50 mg/dL (ref <150)
VLDL: 10 mg/dL (ref 0–40)

## 2013-07-19 LAB — SEDIMENTATION RATE: Sed Rate: 0 mm/hr (ref 0–16)

## 2013-07-19 LAB — BASIC METABOLIC PANEL
CO2: 21 mEq/L (ref 19–32)
Calcium: 8.5 mg/dL (ref 8.4–10.5)
Chloride: 104 mEq/L (ref 96–112)
Creatinine, Ser: 1.24 mg/dL (ref 0.50–1.35)
GFR calc Af Amer: 77 mL/min — ABNORMAL LOW (ref >90)
Potassium: 4.3 mEq/L (ref 3.5–5.1)

## 2013-07-19 LAB — HEPATIC FUNCTION PANEL
ALT: 28 U/L (ref 0–53)
Albumin: 3.3 g/dL — ABNORMAL LOW (ref 3.5–5.2)
Alkaline Phosphatase: 46 U/L (ref 39–117)
Bilirubin, Direct: 0.1 mg/dL (ref 0.0–0.3)
Total Bilirubin: 0.4 mg/dL (ref 0.3–1.2)
Total Protein: 6.2 g/dL (ref 6.0–8.3)

## 2013-07-19 LAB — URINALYSIS, ROUTINE W REFLEX MICROSCOPIC
Bilirubin Urine: NEGATIVE
Glucose, UA: NEGATIVE mg/dL
Ketones, ur: NEGATIVE mg/dL
Protein, ur: NEGATIVE mg/dL

## 2013-07-19 LAB — CK TOTAL AND CKMB (NOT AT ARMC): Total CK: 327 U/L — ABNORMAL HIGH (ref 7–232)

## 2013-07-19 LAB — TROPONIN I
Troponin I: 4.14 ng/mL (ref <0.30)
Troponin I: 1.39 ng/mL (ref <0.30)

## 2013-07-19 MED ORDER — SODIUM CHLORIDE 0.9 % IV SOLN: 250.0000 mL | INTRAVENOUS | Status: DC | PRN

## 2013-07-19 MED ORDER — SODIUM CHLORIDE 0.9 % IJ SOLN: 3.0000 mL | Freq: Two times a day (BID) | INTRAMUSCULAR | Status: DC

## 2013-07-19 MED ORDER — ASPIRIN 81 MG PO CHEW: 81.0000 mg | CHEWABLE_TABLET | ORAL | Status: DC

## 2013-07-19 MED ORDER — LOSARTAN POTASSIUM 25 MG PO TABS
25.0000 mg | ORAL_TABLET | Freq: Every day | ORAL | Status: DC
  Administered 2013-07-20: 10:00:00 25 mg via ORAL
  Filled 2013-07-19: qty 1

## 2013-07-19 MED ORDER — SODIUM CHLORIDE 0.9 % IJ SOLN: 3.0000 mL | INTRAMUSCULAR | Status: DC | PRN

## 2013-07-19 MED ORDER — TICAGRELOR 90 MG PO TABS
90.0000 mg | ORAL_TABLET | Freq: Two times a day (BID) | ORAL | Status: DC
  Administered 2013-07-19 – 2013-07-20 (×3): 90 mg via ORAL
  Filled 2013-07-19 (×5): qty 1

## 2013-07-19 NOTE — Progress Notes (Addendum)
Subjective: Chest Pain much better, some LLQ pain today. RUQ better  Objective: Vital signs in last 24 hours: Temp:  [97.7 F (36.5 C)-98.5 F (36.9 C)] 97.7 F (36.5 C) (12/16 1203) Pulse Rate:  [44-75] 67 (12/16 1203) Resp:  [14-20] 18 (12/16 1203) BP: (112-145)/(67-90) 139/71 mmHg (12/16 1203) SpO2:  [95 %-100 %] 97 % (12/16 1203) Weight:  [203 lb 7.8 oz (92.3 kg)-207 lb 0.2 oz (93.9 kg)] 203 lb 7.8 oz (92.3 kg) (12/16 0409) Weight change:  Last BM Date: 07/18/13 Intake/Output from previous day: +25  12/15 0701 - 12/16 0700 In: 925 [P.O.:600; I.V.:325] Out: 900 [Urine:900] Intake/Output this shift:   PE:  General appearance: alert, cooperative, appears stated age and no distress Neck: no adenopathy, no carotid bruit, no JVD and supple, symmetrical, trachea midline Lungs: clear to auscultation bilaterally and normal percussion bilaterally Heart: regular rate and rhythm, S1, S2 normal, no murmur, click, rub or gallop and normal apical impulse; decreased overall precordial tenderness. Abdomen: soft, ND - mild LLQ tenderness. Extremities: extremities normal, atraumatic, no cyanosis or edema; R radial cath sit ec/d/i -no hematoma Pulses: 2+ and symmetric     Lab Results:  Recent Labs  07/18/13 1445 07/19/13 0442  WBC 9.2 10.8*  HGB 14.7 14.3  HCT 41.7 40.8  PLT 279 284   BMET  Recent Labs  07/18/13 1445 07/19/13 0442  NA 141 135  K 3.8 4.3  CL 107 104  CO2 26 21  GLUCOSE 94 136*  BUN 10 10  CREATININE 1.28 1.24  CALCIUM 8.4 8.5    Recent Labs  07/19/13 0440 07/19/13 1100  TROPONINI 3.37* 1.39*    Lab Results  Component Value Date   CHOL 159 07/19/2013   HDL 55 07/19/2013   LDLCALC 94 07/19/2013   TRIG 50 07/19/2013   CHOLHDL 2.9 07/19/2013   No results found for this basename: HGBA1C     Lab Results  Component Value Date   TSH 0.997 12/14/2012    Hepatic Function Panel  Recent Labs  07/19/13 1100  PROT 6.2  ALBUMIN 3.3*  AST  31  ALT 28  ALKPHOS 46  BILITOT 0.4  BILIDIR 0.1  IBILI 0.3    Recent Labs  07/19/13 0440  CHOL 159   No results found for this basename: PROTIME,  in the last 72 hours     Studies/Results: Dg Chest 2 View  07/18/2013   CLINICAL DATA:  Heaviness in the chest. Bilateral upper abdominal pain.  EXAM: CHEST  2 VIEW  COMPARISON:  03/13/2013.  FINDINGS: The heart size and mediastinal contours are within normal limits. Both lungs are clear. The visualized skeletal structures are unremarkable. Prior cervical fusion.  IMPRESSION: No active cardiopulmonary disease.   Electronically Signed   By: Davonna Belling M.D.   On: 07/18/2013 15:29   US Abdomen Complete  07/19/2013   CLINICAL DATA:  Right upper quadrant pain common nausea  EXAM: ULTRASOUND ABDOMEN COMPLETE  COMPARISON:  None.  FINDINGS: Gallbladder:  No gallstones or wall thickening visualized, measuring 2.1 mm. No sonographic Murphy sign appreciated.  Common bile duct:  Diameter: 3.2 mm  Liver:  A 2 x 1.6 x 2.1 cm cyst is identified within the right lobe of the liver. Liver is otherwise unremarkable. Hepatopetal flow is identified within the portal vein.  IVC:  No abnormality visualized.  Pancreas:  Nonvisualized  Spleen:  The spleen is poorly visualized and is grossly unremarkable. Visualization is degraded by overlying bowel gas.  Right Kidney:  Length: 11.6 cm. Echogenicity within normal limits. No mass or hydronephrosis visualized.  Left Kidney:  Length: 12.5 cm. Echogenicity within normal limits. A 3.2 x 3 x 3.1 cm cyst is appreciated within the upper pole. Is otherwise no evidence of solid masses, hydronephrosis, nor calculi. Marland Kitchen  Abdominal aorta:  No aneurysm visualized.  Other findings:  None.  IMPRESSION: Hepatic cyst, and renal cysts. No sonographic evidence of cholecystitis. No evidence of sonographic abnormalities.   Electronically Signed   By: Salome Holmes M.D.   On: 07/19/2013 09:31   Cardiac cath: POST-OPERATIVE DIAGNOSIS:    Widely patent LAD stent - very large vessels; no significant residual CAD  Well preserved LVEF with normal LV Pressures : 130/12 mmHg (18 mmHg); AoP 124/77 mmHg (97 mmHg)  Non CAD related Troponin Elevation - ? Myo-Pericarditis    Medications: I have reviewed the patient's current medications. Scheduled Meds: . atorvastatin  80 mg Oral q1800  . colchicine  0.6 mg Oral Daily  . folic acid  1 mg Oral Daily  . isosorbide mononitrate  30 mg Oral Daily  . lisinopril  5 mg Oral Daily  . metoprolol tartrate  12.5 mg Oral BID  . PARoxetine  10 mg Oral BH-q7a  . predniSONE  10 mg Oral PC lunch  . predniSONE  10 mg Oral 3 x daily with food  . [START ON 07/20/2013] predniSONE  10 mg Oral 4X daily taper  . predniSONE  20 mg Oral Nightly  . QUEtiapine  300 mg Oral QHS  . rivaroxaban  20 mg Oral Q supper  . Ticagrelor  90 mg Oral BID   Continuous Infusions:  PRN Meds:.acetaminophen, mometasone-formoterol, nitroGLYCERIN, ondansetron (ZOFRAN) IV, traMADol  Assessment/Plan: Principal Problem:   Acute pericarditis, unspecified Active Problems:   NSTEMI (non-ST elevated myocardial infarction), 07/18/13, stable cath with patent stent.   CAD S/P percutaneous coronary angioplasty 02/21/12 non-STEMI -Promus DES 4.0 mm 20 mm to prox LAD   Pulmonary embolism and infarction, 03/13/13, on Xarelto   Dyslipidemia, goal LDL below 70   Chest pain, musculoskeletal   Tobacco abuse   Pericarditis   Chronic anticoagulation, since 03/2013 for PE  PLAN: 52 y.o. male with prior NSTEMI - PCI of LAD with DES, PE & h/o ? Pericarditis p/w prolonged L side CP --> ruled in with + Troponin c/w NSTEMI. Cath yesterday with stable CAD.  Pt also with RUQ pain for abd ultrasound now.  Awaiting echo.  Troponin pk at 4.34 last pm.  eval for myocarditis/pericaditis.  Check CKMB with next troponin.  Recheck LFTs.   Now on steroids and colchicine.     LOS: 1 day   Time spent with pt. :15 minutes. [facility] R  Nurse  Practitioner Certified Pager [phone] or after 5pm and on weekends call [phone] 07/19/2013, 11:57 AM  Most likely cause of + troponins = myo/pericaditis.  Pain was more pleuritic in description, but Troponin level was substantial & CK/MB elevation correlate.  EF preserved by LV Gram -- Echo pending. Stranglely, no obvious cause can be identified.  No recent illness.. Checking ESR (but may be reduced by present Rx with steroinds0.  At this point, will continue with steroid/colchicine Rx (plan 2 week taper) & continue colchicine x 1 month.  Given patent stent & + blood in stool, will d/c ASA while on Brilinta & Xarelto (concerted to 20mg  dose)  BP & HR stable on BB & ACE-I == with cough & dyspnea will d/c ACE-I & exchange  for ARB.  RUQ [place] normal - besides hepatic & renal cysts.  Goal for today is ambulate in hall to assess for continued Sx.  Anticipate early d/c in AM.   I have seen and evaluated the patient this AM along with  Nada Boozer, NP. I agree with her findings, examination as well as impression recommendations.   Marykay Lex, M.D., M.S. [facility] [address] [place], [place]  [address]  [phone] Pager # [phone] 07/19/2013

## 2013-07-19 NOTE — ED Provider Notes (Signed)
Medical screening examination/treatment/procedure(s) were conducted as a shared visit with non-physician practitioner(s) and myself.  I personally evaluated the patient during the encounter.  EKG Interpretation    Date/Time:  Monday July 18 2013 14:14:19 EST Ventricular Rate:  55 PR Interval:  171 QRS Duration: 91 QT Interval:  449 QTC Calculation: 429 R Axis:   76 Text Interpretation:  Sinus rhythm ST elev, probable normal early repol pattern No significant change since last tracing Confirmed by Suezette Lafave  MD, Dougles Kimmey ([id number]) on 07/18/2013 2:42:13 PM            Pt c/o chest discomfort since last pm. Constant. Dull. +chest wall tenderness. No pleuritic pain. Chest cta. Labs. Ecg. Cxr.  Trop elev. Cardiology contacted - will see in ed/admit.   Suzi Roots, MD 07/19/13 [phone]

## 2013-07-19 NOTE — Progress Notes (Signed)
CRITICAL VALUE ALERT  Critical value received:  Ckmb, trop   Date of notification:  t   Time of notification:  1154  Critical value read back:yes  Nurse who received alert:  Berle Mull RN  MD notified (1st page):  Nada Boozer RN  Time of first page:  1155  MD notified (2nd page):  Time of second page:  Responding MD:  Nada Boozer RN, NP  Time MD responded:  [phone]

## 2013-07-19 NOTE — Progress Notes (Signed)
CARDIAC REHAB PHASE I   PRE:  Rate/Rhythm: 63 SR    BP: sitting 118/78    SaO2:   MODE:  Ambulation: 1000 ft   POST:  Rate/Rhythm: 84 SR    BP: sitting 139/81     SaO2:   Pt c/o nausea and HA after lunch in bed. Pt ambulated 500 ft with slow pace. C/o SOB, which he sts is typical. Pt returned to room and used his inhaler. Pt ambulated another 500 ft. Sts he has the same SOB he normally has. Again after walk used inhaler.  To recliner, VSS although HR irregular at times, maintaining SR. Pt sts he is still nauseated and with a HA. This could be nicotine withdrawal. Pt would like a patch. Discussed smoking cessation. Pt sts he is contemplating quitting. Gave resources and quitting methods. Girlfriend was present and she was receptive to information.  [phone]  Elissa Lovett Aguanga CES, ACSM 07/19/2013 2:16 PM

## 2013-07-19 NOTE — Progress Notes (Signed)
  Echocardiogram 2D Echocardiogram has been performed.  Timothy Peck 07/19/2013, 9:20 AM

## 2013-07-19 NOTE — Progress Notes (Signed)
CRITICAL VALUE ALERT  Critical value received: troponin 4.34  Date of notification:  07/18/14  Time of notification:  19:57  Critical value read back:yes  Nurse who received alert:  Toney Sang RN  MD notified (1st page):  Wilburt Finlay PA  Time of first page:  19:55  MD notified (2nd page):  Time of second page:  Responding MD:  Wilburt Finlay PA  Time MD responded:  19:57

## 2013-07-20 DIAGNOSIS — F17200 Nicotine dependence, unspecified, uncomplicated: Secondary | ICD-10-CM

## 2013-07-20 DIAGNOSIS — I319 Disease of pericardium, unspecified: Secondary | ICD-10-CM

## 2013-07-20 DIAGNOSIS — J9600 Acute respiratory failure, unspecified whether with hypoxia or hypercapnia: Secondary | ICD-10-CM

## 2013-07-20 DIAGNOSIS — R079 Chest pain, unspecified: Secondary | ICD-10-CM

## 2013-07-20 MED ORDER — RIVAROXABAN 20 MG PO TABS: 20.0000 mg | ORAL_TABLET | Freq: Every day | ORAL | Status: DC

## 2013-07-20 MED ORDER — PREDNISONE (PAK) 10 MG PO TABS: ORAL_TABLET | Freq: Four times a day (QID) | ORAL | Status: DC

## 2013-07-20 MED ORDER — COLCHICINE 0.6 MG PO TABS: 0.6000 mg | ORAL_TABLET | Freq: Every day | ORAL | Status: DC

## 2013-07-20 MED ORDER — TICAGRELOR 90 MG PO TABS: 90.0000 mg | ORAL_TABLET | Freq: Two times a day (BID) | ORAL | Status: DC

## 2013-07-20 NOTE — Discharge Summary (Signed)
Physician Discharge Summary     Patient ID: Timothy Peck MRN: [id number] DOB/AGE: 50-Jan-1962 50 y.o.  Cardiologist: Herbie Baltimore  Admit date: 07/18/2013 Discharge date: 07/20/2013  Admission Diagnoses:  NSTEMI, pericarditis  Discharge Diagnoses:  Principal Problem:   Acute pericarditis, unspecified Active Problems:   Pericarditis   NSTEMI (non-ST elevated myocardial infarction), 07/18/13, stable cath with patent stent.   Tobacco abuse   CAD S/P percutaneous coronary angioplasty 02/21/12 non-STEMI -Promus DES 4.0 mm 20 mm to prox LAD   Dyslipidemia, goal LDL below 70   Pulmonary embolism and infarction, 03/13/13, on Xarelto   Chest pain, musculoskeletal   Chronic anticoagulation, since 03/2013 for PE   Discharged Condition: stable  Hospital Course:  Timothy Peck is a 52 y.o. male with a PMH PE, CAD( non-ST elevation MI in July 2013), PCI to proximal LAD.  His initial catheterization showed a significant hazy/thrombotic proximal LAD lesion and was treated overnight with Integrilin. A couple days later showed a lesion persisted as a 80-85% residual stenosis. This was treated with a Promus Element DES 4.0 mm 20 mm dose postdilated up to 4.25 mm. He then had a relatively rocky post MI course were he spent some time in the [facility] correctional facilities where became clear that he was not appropriately receiving his dual antiplatelet therapy (DAPT). He then came back to see Dr. Herbie Baltimore in May as having significant left-sided chest pain, he felt it prudent to investigate his coronary anatomy most notably his LAD stent since it was unclear if he had been an uninterrupted DAPT. His LAD stent was wide open.   He then presented on August 10 with severe left-sided chest pain as well as side pain radiating to his left arm that was worse with deep inspiration. He ruled out for an MI, but was found to have an elevated d-dimer and was found to have bilateral lower lobe pulmonary emboli on CT angiogram with  findings consistent with alveolar hemorrhage and likely pulmonary infarction. He was initiated on heparin initially and then switched to Xarelto. Lower extremity venous Dopplers did not show clot. No clear-cut etiology such as prolonged travel was identified.   The patient presented this time with chest pain which started yesterday around 0800hrs. He reports it as a heaviness, 10/10. He has been taking nitro consistently since yesterday with no relief. He said he had some pain in his left arm but not always, Nausea, SOB, diaphoresis. He noticed some blood in his stool and decided to only take the Xarelto everyother day. The patient currently denies vomiting, fever, orthopnea, dizziness, PND, cough, congestion, abdominal pain, melena, lower extremity edema, claudication.  The pain was clearly reproducible with palpation however, his troponin was positive and peaked at 4.34.  He was started on heparin and taken to the cath lab.  LHC revealed patent stent in the LAD and no progression of CAD.  Pericarditis was thought to be the diagnosis.  He was started on colchicine and prednisone taper.  His pain resolved by day two.  Continue brilinta and Xarelto.  Normal EF and wall motion on echo.   Negative d-dimer.  Abd [place] completed for severe pain and revealed Hepatic cyst, and renal cysts. No sonographic evidence of cholecystitis. No evidence of sonographic abnormalities.  The patient was seen by Dr. Allyson Sabal who felt he was stable for DC home.  Follow up in 2-3 weeks.  Finish prednisone taper and 3 weeks of colchicine.   Consults: None  Significant Diagnostic Studies:  Abd US IMPRESSION: Hepatic cyst, and renal cysts. No sonographic evidence of cholecystitis. No evidence of sonographic abnormalities.  Echo Study Conclusions  - Left ventricle: The cavity size was normal. Systolic function was normal. The estimated ejection fraction was in the range of 55% to 60%. Wall motion was normal; there were no  regional wall motion abnormalities. Left ventricular diastolic function parameters were normal. - Mitral valve: Mild regurgitation directed centrally.   Treatments: See above  Discharge Exam: Blood pressure 110/58, pulse 58, temperature 98.3 F (36.8 C), temperature source Oral, resp. rate 20, height 6' 4.38" (1.94 m), weight 200 lb 9.9 oz (91 kg), SpO2 93.00%.   Disposition: 01-Home or Self Care  Discharge Orders   Future Appointments Provider Department Dept Phone   08/05/2013 8:30 AM Marykay Lex, MD [facility] [phone]   Future Orders Complete By Expires   Diet - low sodium heart healthy  As directed    Discharge instructions  As directed    Comments:     1.  Stop smoking.   2.  You will taper down the prednisone.   Increase activity slowly  As directed        Medication List    STOP taking these medications       isosorbide mononitrate 30 MG 24 hr tablet  Commonly known as:  IMDUR      TAKE these medications       atorvastatin 80 MG tablet  Commonly known as:  LIPITOR  Take 80 mg by mouth daily at 6 PM.     colchicine 0.6 MG tablet  Take 1 tablet (0.6 mg total) by mouth daily.     folic acid 1 MG tablet  Commonly known as:  FOLVITE  Take 1 mg by mouth daily.     HYDROcodone-acetaminophen 5-325 MG per tablet  Commonly known as:  NORCO  Take 1 tablet by mouth every 12 (twelve) hours as needed for pain.     lisinopril 5 MG tablet  Commonly known as:  PRINIVIL,ZESTRIL  Take 5 mg by mouth daily.     metoprolol tartrate 12.5 mg Tabs tablet  Commonly known as:  LOPRESSOR  Take 0.5 tablets (12.5 mg total) by mouth 2 (two) times daily.     mometasone-formoterol 100-5 MCG/ACT Aero  Commonly known as:  DULERA  Inhale 2 puffs into the lungs 2 (two) times daily as needed for wheezing or shortness of breath.     nitroGLYCERIN 0.4 MG SL tablet  Commonly known as:  NITROSTAT  Place 0.4 mg under the tongue every 5 (five) minutes as needed for  chest pain.     PARoxetine 10 MG tablet  Commonly known as:  PAXIL  Take 10 mg by mouth every morning.     predniSONE 10 MG tablet  Commonly known as:  STERAPRED UNI-PAK  Take by mouth taper from 4 doses each day to 1 dose and stop. 4 daily x 2 days, three times daily x 3 days, two times daily x 3 days, one time daily x 3 days.  STOP.     QUEtiapine 300 MG 24 hr tablet  Commonly known as:  SEROQUEL XR  Take 300 mg by mouth at bedtime.     Rivaroxaban 20 MG Tabs tablet  Commonly known as:  XARELTO  Take 1 tablet (20 mg total) by mouth daily with supper.     Ticagrelor 90 MG Tabs tablet  Commonly known as:  BRILINTA  Take 1 tablet (90  mg total) by mouth 2 (two) times daily.     traMADol 50 MG tablet  Commonly known as:  ULTRAM  Take 1 tablet (50 mg total) by mouth every 6 (six) hours as needed for pain.     vitamin C 1000 MG tablet  Take 1,000 mg by mouth daily.           Follow-up Information   Follow up with Marykay Lex, MD On 08/05/2013. (830am)    Specialty:  Cardiology   Contact information:   [address] [place] [address] [phone]       Signed: Wilburt Finlay 07/20/2013, 10:30 AM

## 2013-07-20 NOTE — Progress Notes (Signed)
Pt declined ambulating this am. Sts he will after breakfast. Sts he walked last night x2 more without CP. Used his inhaler x1. Reviewed NTG with pt. Encouraged pt to walk as tolerated at home.  [id number] Ethelda Chick CES, ACSM 8:15 AM 07/20/2013

## 2013-07-20 NOTE — Care Management Note (Signed)
    Page 1 of 1   07/20/2013     10:38:49 AM   CARE MANAGEMENT NOTE 07/20/2013  Patient:  Timothy Peck, Timothy Peck   Account Number:  [id number]  Date Initiated:  07/20/2013  Documentation initiated by:  [facility]  Subjective/Objective Assessment:   52 yo male admitted with chest pain//Home with spouse     Action/Plan:   heart cath//Home with self care; benefits check for Brilinta   Anticipated DC Date:  07/20/2013   Anticipated DC Plan:  HOME/SELF CARE  In-house referral  Clinical Social Worker      DC Planning Services  CM consult      Choice offered to / List presented to:             Status of service:   Medicare Important Message given?   (If response is "NO", the following Medicare IM given date fields will be blank) Date Medicare IM given:   Date Additional Medicare IM given:    Discharge Disposition:    Per UR Regulation:    If discussed at Long Length of Stay Meetings, dates discussed:    Comments:  07/20/13 1000 Oletta Cohn, RN, BSN, [employer] [phone] Spoke with pt at bedside regarding Brilinta medication.  Pt utilizes [facility] on [address] for prescription needs. NCM called pharmacy to ensure medication in stock.  Pt currently on Brilinta and know the importance of refilling prescription promptly upon d/c home.  Pt also requesting ride home as he has no way to get there. Genelle Bal, LCSW notified and will provide bus pass to pt.

## 2013-07-20 NOTE — Progress Notes (Signed)
Subjective: Feels better. Imdur gives him a headache   Objective: Vital signs in last 24 hours: Temp:  [97.7 F (36.5 C)-98.3 F (36.8 C)] 98.3 F (36.8 C) (12/17 0635) Pulse Rate:  [58-86] 58 (12/17 0635) Resp:  [18-20] 20 (12/17 0635) BP: (90-139)/(48-99) 110/58 mmHg (12/17 0635) SpO2:  [93 %-99 %] 93 % (12/17 0635) Weight:  [200 lb 9.9 oz (91 kg)] 200 lb 9.9 oz (91 kg) (12/17 0108) Last BM Date: 07/18/13  Intake/Output from previous day: 12/16 0701 - 12/17 0700 In: 1560 [P.O.:1560] Out: 1650 [Urine:1650] Intake/Output this shift:    Medications Current Facility-Administered Medications  Medication Dose Route Frequency Provider Last Rate Last Dose  . acetaminophen (TYLENOL) tablet 650 mg  650 mg Oral Q4H PRN Wilburt Finlay, PA-C   650 mg at 07/19/13 1703  . atorvastatin (LIPITOR) tablet 80 mg  80 mg Oral q1800 Wilburt Finlay, PA-C   80 mg at 07/19/13 1703  . colchicine tablet 0.6 mg  0.6 mg Oral Daily Wilburt Finlay, PA-C   0.6 mg at 07/19/13 0941  . folic acid (FOLVITE) tablet 1 mg  1 mg Oral Daily Wilburt Finlay, PA-C   1 mg at 07/19/13 0941  . isosorbide mononitrate (IMDUR) 24 hr tablet 30 mg  30 mg Oral Daily Wilburt Finlay, PA-C   30 mg at 07/19/13 0941  . losartan (COZAAR) tablet 25 mg  25 mg Oral Daily Marykay Lex, MD      . metoprolol tartrate (LOPRESSOR) tablet 12.5 mg  12.5 mg Oral BID Wilburt Finlay, PA-C   12.5 mg at 07/19/13 2146  . mometasone-formoterol (DULERA) 100-5 MCG/ACT inhaler 2 puff  2 puff Inhalation BID PRN Wilburt Finlay, PA-C      . nitroGLYCERIN (NITROSTAT) SL tablet 0.4 mg  0.4 mg Sublingual Q5 Min x 3 PRN Wilburt Finlay, PA-C      . ondansetron (ZOFRAN) injection 4 mg  4 mg Intravenous Q6H PRN Wilburt Finlay, PA-C      . PARoxetine (PAXIL) tablet 10 mg  10 mg Oral Jacki Cones, PA-C   10 mg at 07/20/13 6213  . predniSONE (STERAPRED UNI-PAK) tablet 10 mg  10 mg Oral 4X daily taper Wilburt Finlay, PA-C      . QUEtiapine (SEROQUEL XR) 24 hr tablet 300 mg  300 mg  Oral QHS Wilburt Finlay, PA-C   300 mg at 07/19/13 2146  . Rivaroxaban (XARELTO) tablet 20 mg  20 mg Oral Q supper Marykay Lex, MD   20 mg at 07/19/13 1703  . Ticagrelor (BRILINTA) tablet 90 mg  90 mg Oral BID Nada Boozer, NP   90 mg at 07/19/13 2148  . traMADol (ULTRAM) tablet 50 mg  50 mg Oral Q6H PRN Wilburt Finlay, PA-C   50 mg at 07/19/13 1308    PE: General appearance: alert, cooperative and no distress Lungs: Wheeze on the right. Heart: regular rate and rhythm, S1, S2 normal, no murmur, click, rub or gallop Chest wall:  nontender Extremities: No LEE Pulses: 2+ and symmetric Skin: Warm and dry Neurologic: Grossly normal  Lab Results:   Recent Labs  07/18/13 1445 07/19/13 0442  WBC 9.2 10.8*  HGB 14.7 14.3  HCT 41.7 40.8  PLT 279 284   BMET  Recent Labs  07/18/13 1445 07/19/13 0442  NA 141 135  K 3.8 4.3  CL 107 104  CO2 26 21  GLUCOSE 94 136*  BUN 10 10  CREATININE 1.28 1.24  CALCIUM 8.4 8.5  PT/INR No results found for this basename: LABPROT, INR,  in the last 72 hours Cholesterol  Recent Labs  07/19/13 0440  CHOL 159   Cardiac Panel (last 3 results)  Recent Labs  07/18/13 2328 07/19/13 0440 07/19/13 1100  CKTOTAL  --   --  327*  CKMB  --   --  12.1*  TROPONINI 4.14* 3.37* 1.39*  RELINDX  --   --  3.7*      Assessment/Plan   Principal Problem:   Acute pericarditis, unspecified Active Problems:   Pericarditis   NSTEMI (non-ST elevated myocardial infarction), 07/18/13, stable cath with patent stent.   Tobacco abuse   CAD S/P percutaneous coronary angioplasty 02/21/12 non-STEMI -Promus DES 4.0 mm 20 mm to prox LAD   Dyslipidemia, goal LDL below 70   Pulmonary embolism and infarction, 03/13/13, on Xarelto   Chest pain, musculoskeletal   Chronic anticoagulation, since 03/2013 for PE  Plan:  NSTEMI in setting of pericarditis.  Troponin peaked at 4.34.  Widely patent LAD stent.  No significant CAD in other vessels.  Prednisone and  colchicine.   BP and HR stable.  DC home today.      LOS: 2 days    HAGER, BRYAN 07/20/2013 8:30 AM   Agree with note written by Jones Skene PAC  No further CP. Cath clean. Patent stent. + troponins with typical curve. Presumption is pericarditis by exclusion. On Brilenta and Xarelto. Colchicine and prednisone. Right wrist OK. No rub. OK for DC home. ROV with Dr. Herbie Baltimore.   Runell Gess 07/20/2013 8:51 AM

## 2013-07-22 IMAGING — CR DG CHEST 2V
2 series · 2 of 2 positions shown · non-contrast
Comparison: 12/10/2009.

CLINICAL DATA: Intermittent chest pain.

CHEST - 2 VIEW

[w chest pa]
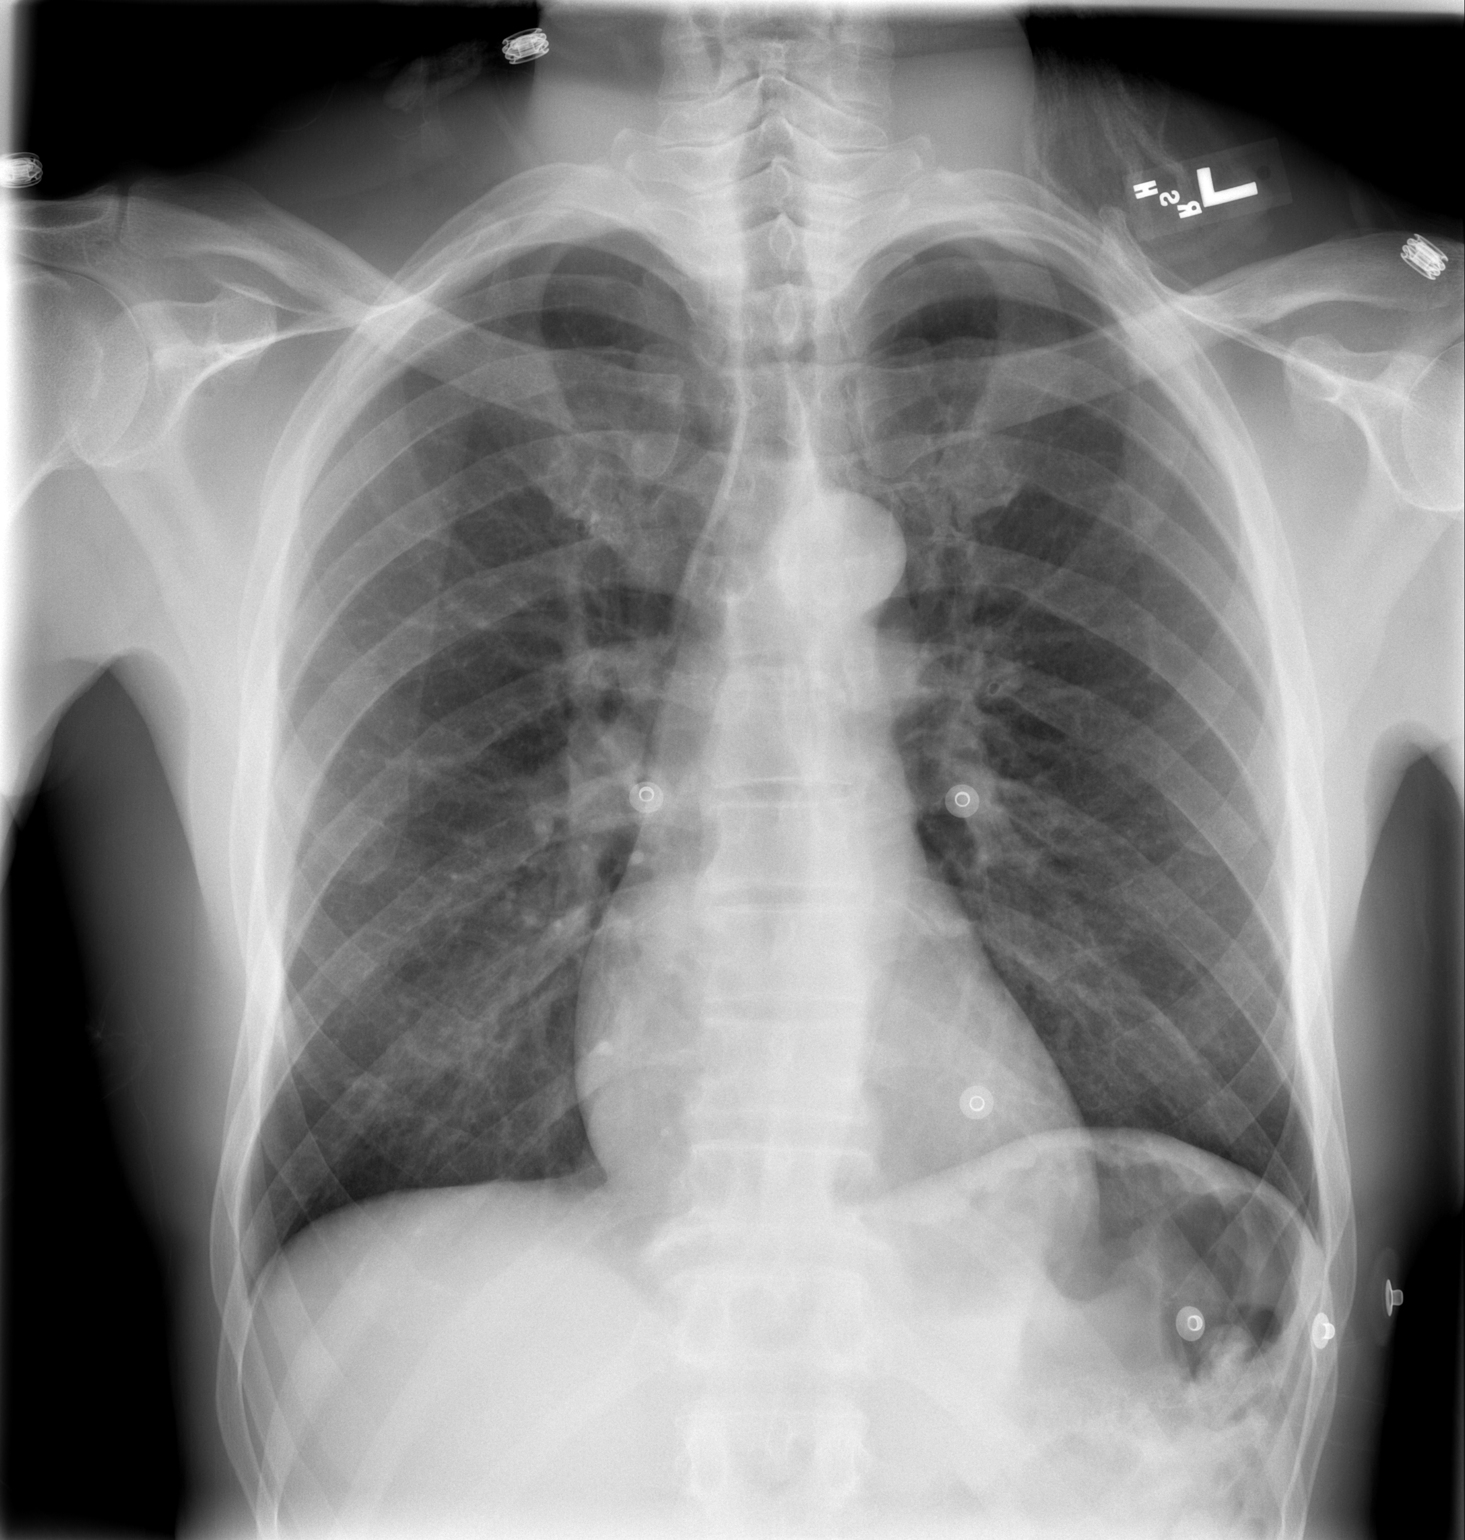

[w chest lat]
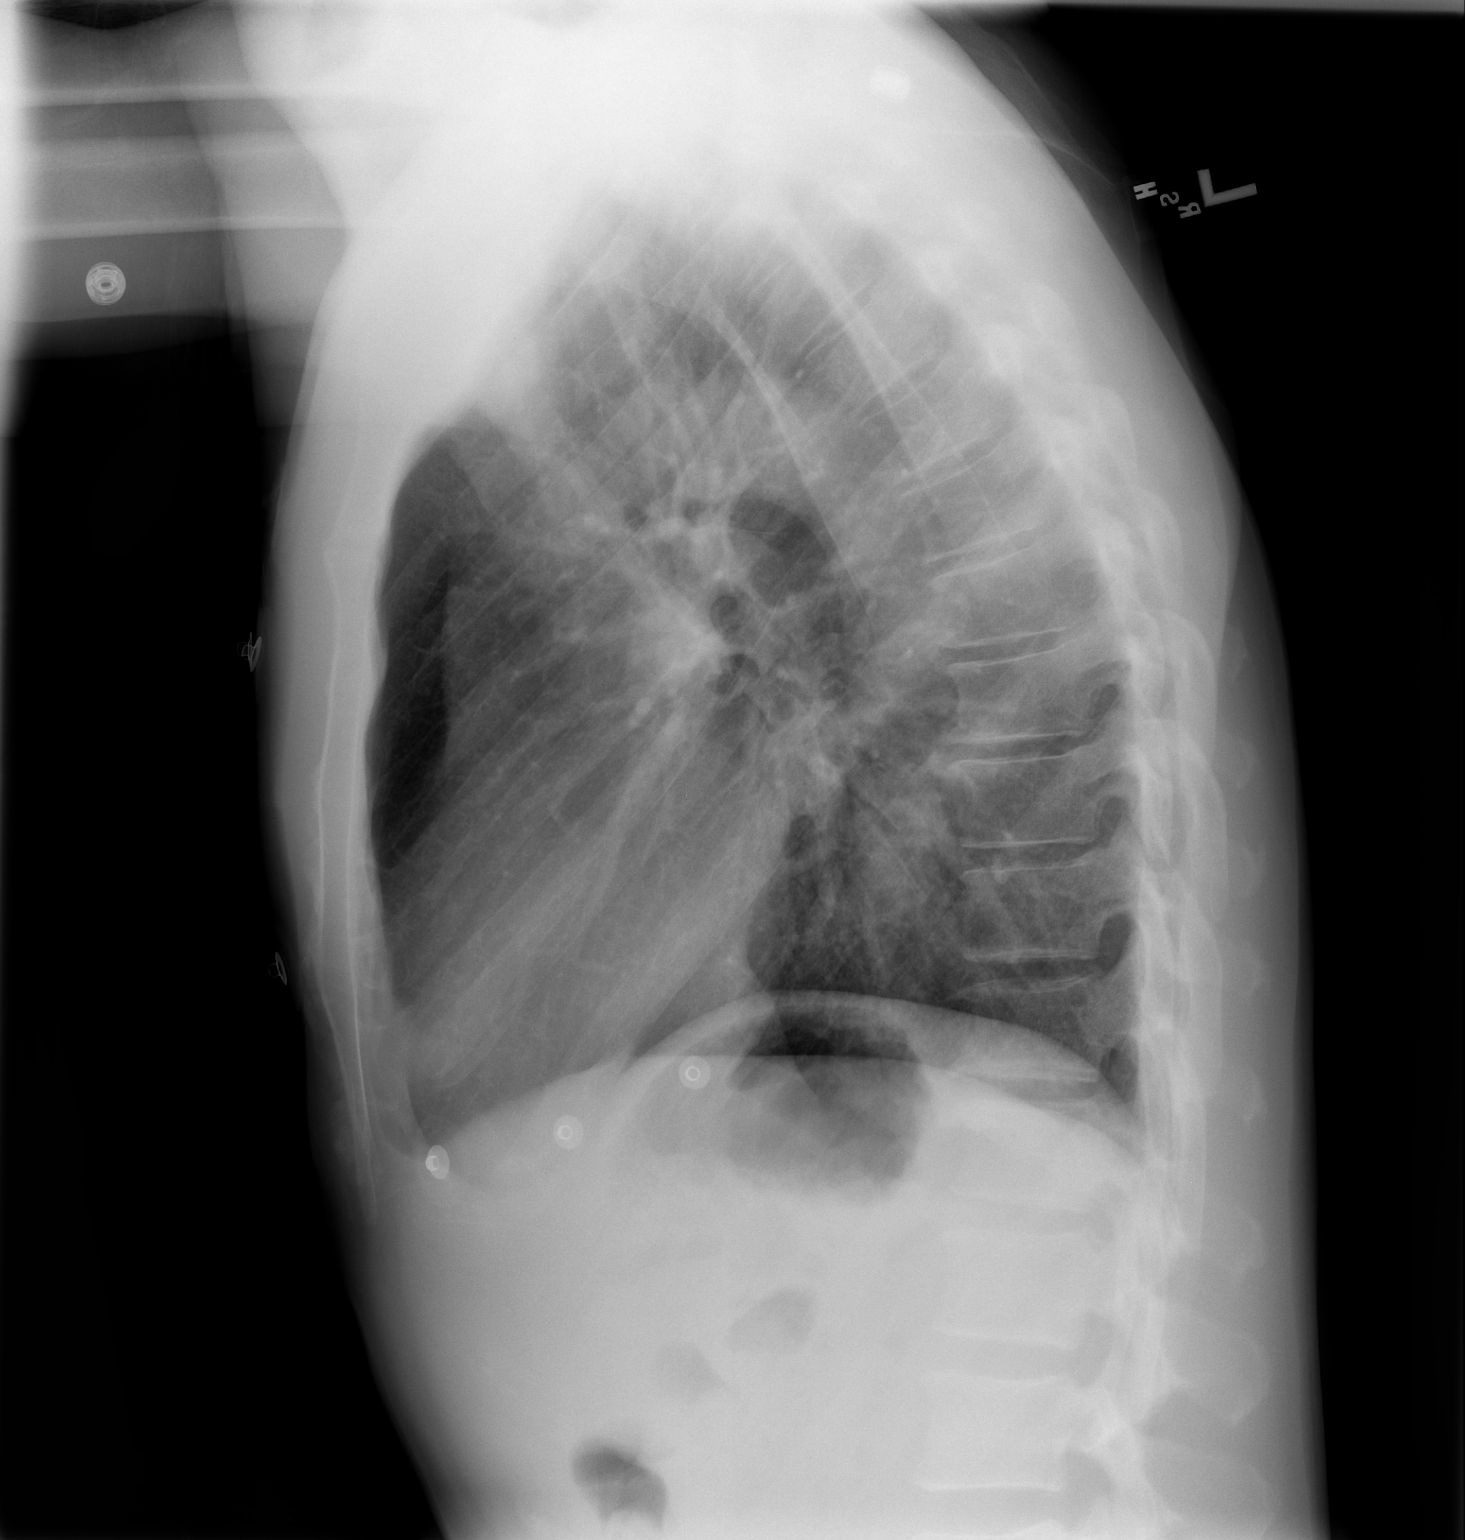

[2 of 2 positions shown; findings below may reference images not displayed]

FINDINGS: Trachea is midline.  Heart size normal.  Lungs are clear.
No pleural fluid.
IMPRESSION: No acute findings.

## 2013-08-05 ENCOUNTER — Ambulatory Visit: Payer: Medicaid Other | Admitting: Cardiology

## 2013-09-04 DIAGNOSIS — Z8673 Personal history of transient ischemic attack (TIA), and cerebral infarction without residual deficits: Secondary | ICD-10-CM

## 2013-09-04 HISTORY — DX: Personal history of transient ischemic attack (TIA), and cerebral infarction without residual deficits: Z86.73

## 2013-09-21 ENCOUNTER — Emergency Department (HOSPITAL_COMMUNITY): Payer: Medicaid Other

## 2013-09-21 ENCOUNTER — Encounter (HOSPITAL_COMMUNITY): Payer: Self-pay | Admitting: Radiology

## 2013-09-21 ENCOUNTER — Inpatient Hospital Stay (HOSPITAL_COMMUNITY)
Admission: EM | Admit: 2013-09-21 | Discharge: 2013-09-27 | DRG: 064 | Disposition: A | Discharge status: Home / Self Care | Payer: Medicaid Other | Attending: Internal Medicine | Admitting: Internal Medicine

## 2013-09-21 DIAGNOSIS — I2510 Atherosclerotic heart disease of native coronary artery without angina pectoris: Secondary | ICD-10-CM

## 2013-09-21 DIAGNOSIS — E785 Hyperlipidemia, unspecified: Secondary | ICD-10-CM

## 2013-09-21 DIAGNOSIS — H547 Unspecified visual loss: Secondary | ICD-10-CM | POA: Diagnosis present

## 2013-09-21 DIAGNOSIS — Z23 Encounter for immunization: Secondary | ICD-10-CM

## 2013-09-21 DIAGNOSIS — Z9861 Coronary angioplasty status: Secondary | ICD-10-CM

## 2013-09-21 DIAGNOSIS — F1210 Cannabis abuse, uncomplicated: Secondary | ICD-10-CM | POA: Diagnosis present

## 2013-09-21 DIAGNOSIS — J45909 Unspecified asthma, uncomplicated: Secondary | ICD-10-CM | POA: Diagnosis present

## 2013-09-21 DIAGNOSIS — Z86711 Personal history of pulmonary embolism: Secondary | ICD-10-CM

## 2013-09-21 DIAGNOSIS — R0789 Other chest pain: Secondary | ICD-10-CM

## 2013-09-21 DIAGNOSIS — I6529 Occlusion and stenosis of unspecified carotid artery: Secondary | ICD-10-CM | POA: Diagnosis present

## 2013-09-21 DIAGNOSIS — J9601 Acute respiratory failure with hypoxia: Secondary | ICD-10-CM

## 2013-09-21 DIAGNOSIS — K921 Melena: Secondary | ICD-10-CM

## 2013-09-21 DIAGNOSIS — K047 Periapical abscess without sinus: Secondary | ICD-10-CM | POA: Diagnosis present

## 2013-09-21 DIAGNOSIS — F1410 Cocaine abuse, uncomplicated: Secondary | ICD-10-CM | POA: Diagnosis present

## 2013-09-21 DIAGNOSIS — I2782 Chronic pulmonary embolism: Secondary | ICD-10-CM | POA: Diagnosis present

## 2013-09-21 DIAGNOSIS — I639 Cerebral infarction, unspecified: Secondary | ICD-10-CM

## 2013-09-21 DIAGNOSIS — I2699 Other pulmonary embolism without acute cor pulmonale: Secondary | ICD-10-CM | POA: Diagnosis present

## 2013-09-21 DIAGNOSIS — I252 Old myocardial infarction: Secondary | ICD-10-CM

## 2013-09-21 DIAGNOSIS — R58 Hemorrhage, not elsewhere classified: Secondary | ICD-10-CM | POA: Diagnosis present

## 2013-09-21 DIAGNOSIS — R29810 Facial weakness: Secondary | ICD-10-CM | POA: Diagnosis present

## 2013-09-21 DIAGNOSIS — R42 Dizziness and giddiness: Secondary | ICD-10-CM | POA: Diagnosis present

## 2013-09-21 DIAGNOSIS — D72829 Elevated white blood cell count, unspecified: Secondary | ICD-10-CM | POA: Diagnosis present

## 2013-09-21 DIAGNOSIS — R51 Headache: Secondary | ICD-10-CM | POA: Diagnosis present

## 2013-09-21 DIAGNOSIS — Z8249 Family history of ischemic heart disease and other diseases of the circulatory system: Secondary | ICD-10-CM

## 2013-09-21 DIAGNOSIS — I6350 Cerebral infarction due to unspecified occlusion or stenosis of unspecified cerebral artery: Secondary | ICD-10-CM

## 2013-09-21 DIAGNOSIS — F17200 Nicotine dependence, unspecified, uncomplicated: Secondary | ICD-10-CM | POA: Diagnosis present

## 2013-09-21 DIAGNOSIS — Z7901 Long term (current) use of anticoagulants: Secondary | ICD-10-CM

## 2013-09-21 DIAGNOSIS — R079 Chest pain, unspecified: Secondary | ICD-10-CM

## 2013-09-21 DIAGNOSIS — N182 Chronic kidney disease, stage 2 (mild): Secondary | ICD-10-CM | POA: Diagnosis present

## 2013-09-21 DIAGNOSIS — R414 Neurologic neglect syndrome: Secondary | ICD-10-CM | POA: Diagnosis present

## 2013-09-21 DIAGNOSIS — I6340 Cerebral infarction due to embolism of unspecified cerebral artery: Principal | ICD-10-CM | POA: Diagnosis present

## 2013-09-21 DIAGNOSIS — G8320 Monoplegia of upper limb affecting unspecified side: Secondary | ICD-10-CM | POA: Diagnosis present

## 2013-09-21 DIAGNOSIS — F329 Major depressive disorder, single episode, unspecified: Secondary | ICD-10-CM | POA: Diagnosis present

## 2013-09-21 DIAGNOSIS — F3289 Other specified depressive episodes: Secondary | ICD-10-CM | POA: Diagnosis present

## 2013-09-21 DIAGNOSIS — Z91199 Patient's noncompliance with other medical treatment and regimen due to unspecified reason: Secondary | ICD-10-CM

## 2013-09-21 DIAGNOSIS — N183 Chronic kidney disease, stage 3 unspecified: Secondary | ICD-10-CM | POA: Diagnosis present

## 2013-09-21 DIAGNOSIS — F1910 Other psychoactive substance abuse, uncomplicated: Secondary | ICD-10-CM

## 2013-09-21 DIAGNOSIS — Z720 Tobacco use: Secondary | ICD-10-CM

## 2013-09-21 DIAGNOSIS — G8190 Hemiplegia, unspecified affecting unspecified side: Secondary | ICD-10-CM | POA: Diagnosis present

## 2013-09-21 DIAGNOSIS — I129 Hypertensive chronic kidney disease with stage 1 through stage 4 chronic kidney disease, or unspecified chronic kidney disease: Secondary | ICD-10-CM | POA: Diagnosis present

## 2013-09-21 DIAGNOSIS — I309 Acute pericarditis, unspecified: Secondary | ICD-10-CM

## 2013-09-21 DIAGNOSIS — F1010 Alcohol abuse, uncomplicated: Secondary | ICD-10-CM

## 2013-09-21 DIAGNOSIS — Z9119 Patient's noncompliance with other medical treatment and regimen: Secondary | ICD-10-CM

## 2013-09-21 DIAGNOSIS — I63239 Cerebral infarction due to unspecified occlusion or stenosis of unspecified carotid arteries: Secondary | ICD-10-CM | POA: Diagnosis present

## 2013-09-21 DIAGNOSIS — R233 Spontaneous ecchymoses: Secondary | ICD-10-CM | POA: Diagnosis present

## 2013-09-21 LAB — COMPREHENSIVE METABOLIC PANEL
ALT: 30 U/L (ref 0–53)
AST: 25 U/L (ref 0–37)
Albumin: 3.5 g/dL (ref 3.5–5.2)
Alkaline Phosphatase: 46 U/L (ref 39–117)
BILIRUBIN TOTAL: 0.3 mg/dL (ref 0.3–1.2)
BUN: 17 mg/dL (ref 6–23)
CHLORIDE: 101 meq/L (ref 96–112)
CO2: 27 mEq/L (ref 19–32)
Calcium: 8.5 mg/dL (ref 8.4–10.5)
Creatinine, Ser: 1.5 mg/dL — ABNORMAL HIGH (ref 0.50–1.35)
GFR calc Af Amer: 61 mL/min — ABNORMAL LOW (ref >90)
GFR calc non Af Amer: 53 mL/min — ABNORMAL LOW (ref >90)
GLUCOSE: 160 mg/dL — AB (ref 70–99)
Potassium: 4.1 mEq/L (ref 3.7–5.3)
SODIUM: 138 meq/L (ref 137–147)
Total Protein: 6.1 g/dL (ref 6.0–8.3)

## 2013-09-21 LAB — POCT I-STAT, CHEM 8
BUN: 17 mg/dL (ref 6–23)
CALCIUM ION: 1.16 mmol/L (ref 1.12–1.23)
Chloride: 99 mEq/L (ref 96–112)
Creatinine, Ser: 1.5 mg/dL — ABNORMAL HIGH (ref 0.50–1.35)
GLUCOSE: 160 mg/dL — AB (ref 70–99)
HCT: 52 % (ref 39.0–52.0)
Hemoglobin: 17.7 g/dL — ABNORMAL HIGH (ref 13.0–17.0)
Potassium: 3.9 mEq/L (ref 3.7–5.3)
Sodium: 142 mEq/L (ref 137–147)
TCO2: 26 mmol/L (ref 0–100)

## 2013-09-21 LAB — POCT I-STAT TROPONIN I: TROPONIN I, POC: 0.02 ng/mL (ref 0.00–0.08)

## 2013-09-21 LAB — CBC
HCT: 46.9 % (ref 39.0–52.0)
Hemoglobin: 17 g/dL (ref 13.0–17.0)
MCH: 32.4 pg (ref 26.0–34.0)
MCHC: 36.2 g/dL — ABNORMAL HIGH (ref 30.0–36.0)
MCV: 89.5 fL (ref 78.0–100.0)
PLATELETS: 299 10*3/uL (ref 150–400)
RBC: 5.24 MIL/uL (ref 4.22–5.81)
RDW: 13.2 % (ref 11.5–15.5)
WBC: 14.2 10*3/uL — ABNORMAL HIGH (ref 4.0–10.5)

## 2013-09-21 LAB — APTT: APTT: 24 s (ref 24–37)

## 2013-09-21 LAB — DIFFERENTIAL
Basophils Absolute: 0 10*3/uL (ref 0.0–0.1)
Basophils Relative: 0 % (ref 0–1)
Eosinophils Absolute: 0.1 10*3/uL (ref 0.0–0.7)
Eosinophils Relative: 1 % (ref 0–5)
LYMPHS ABS: 2 10*3/uL (ref 0.7–4.0)
Lymphocytes Relative: 14 % (ref 12–46)
Monocytes Absolute: 0.6 10*3/uL (ref 0.1–1.0)
Monocytes Relative: 4 % (ref 3–12)
NEUTROS ABS: 11.5 10*3/uL — AB (ref 1.7–7.7)
NEUTROS PCT: 81 % — AB (ref 43–77)

## 2013-09-21 LAB — GLUCOSE, CAPILLARY: Glucose-Capillary: 155 mg/dL — ABNORMAL HIGH (ref 70–99)

## 2013-09-21 LAB — PROTIME-INR
INR: 1.03 (ref 0.00–1.49)
Prothrombin Time: 13.3 seconds (ref 11.6–15.2)

## 2013-09-21 LAB — TROPONIN I

## 2013-09-21 MED ORDER — IOHEXOL 350 MG/ML SOLN
50.0000 mL | Freq: Once | INTRAVENOUS | Status: AC | PRN
  Administered 2013-09-21: 100 mL via INTRAVENOUS

## 2013-09-21 NOTE — Consult Note (Signed)
Referring Physician: Dr. Bebe Shaggy    Chief Complaint: Dizzy, left side weakness and numbness.  HPI: Timothy Peck is an 53 y.o. male with a history of coronary artery disease, myocardial infarction, pulmonary embolism and depression, presenting with new onset weakness involving his left side as well as numbness. Symptoms were associated with feeling of disequilibrium at the onset. No previous history of stroke nor TIA. Patient has not been compliant with anticoagulation and has not taken Xarelto no for several months. Patient also admits that he has not been taking Brilinta, as well. CT scan of his head showed no acute intracranial abnormality. CTA of the head and neck showed a hematoma at the origin of the right ICA causing a 50% stenosis. Study was otherwise unremarkable. No intracranial vascular abnormality was seen. NIH stroke score was 10. He was last known well at 1600 today.  LSN: [id number] on 09/21/2013 tPA Given: No: beyond time window MRankin: 2  Past Medical History  Diagnosis Date  . Depression   . Degenerative disc disease, cervical     Dx years ago  . CAD (coronary artery disease) 02/2012    NSTEMI - PCI to prox LAD  . Presence of drug coated stent in LAD coronary artery 02/2012    Promus Element 4.0 mm x 28 mm  . MI (myocardial infarction) 02/02/2012    Normal 2D Echo - EF-60  . Hematoma, s/p cath -rt wrist 12/22/2012  . Chest pain, stable cardiac cath 12/2012 12/22/2012  . NSTEMI (non-ST elevated myocardial infarction), 07/18/13, stable cath with patent stent. 01/31/2012  . Chronic anticoagulation, since 03/2013 for PE 07/19/2013    Family History  Problem Relation Age of Onset  . Anesthesia problems Neg Hx   . Hypotension Neg Hx   . Malignant hyperthermia Neg Hx   . Pseudochol deficiency Neg Hx   . Coronary artery disease Father      Medications: I have reviewed the patient's current medications.  ROS: History obtained from the patient  General ROS: negative for - chills,  fatigue, fever, night sweats, weight gain or weight loss Psychological ROS: negative for - behavioral disorder, hallucinations, memory difficulties, mood swings or suicidal ideation Ophthalmic ROS: negative for - blurry vision, double vision, eye pain or loss of vision ENT ROS: negative for - epistaxis, nasal discharge, oral lesions, sore throat, tinnitus or vertigo Allergy and Immunology ROS: negative for - hives or itchy/watery eyes Hematological and Lymphatic ROS: negative for - bleeding problems, bruising or swollen lymph nodes Endocrine ROS: negative for - galactorrhea, hair pattern changes, polydipsia/polyuria or temperature intolerance Respiratory ROS: negative for - cough, hemoptysis, shortness of breath or wheezing Cardiovascular ROS: negative for - chest pain, dyspnea on exertion, edema or irregular heartbeat Gastrointestinal ROS: negative for - abdominal pain, diarrhea, hematemesis, nausea/vomiting or stool incontinence Genito-Urinary ROS: negative for - dysuria, hematuria, incontinence or urinary frequency/urgency Musculoskeletal ROS: negative for - joint swelling or muscular weakness Neurological ROS: as noted in HPI Dermatological ROS: negative for rash and skin lesion changes  Physical Examination: Blood pressure 124/76, pulse 76, temperature 98.5 F (36.9 C), temperature source Oral, resp. rate 16, SpO2 98.00%.  Neurologic Examination: Mental Status: Alert, oriented, affect somewhat bland.  Speech fluent without evidence of aphasia. Able to follow commands. Cranial Nerves: II-apparent left field defect. III/IV/VI-Pupils were equal and reacted. Extraocular movements were full and conjugate; possible gaze preference to the right side.    V/VII-reduced perception of tactile sensation of the left side of face compared to the  right; minimal left facial weakness. VIII-normal. X-normal speech. Motor: Equivocal weakness of left arm and leg with inconsistent effort. No weakness of  right extremities. Sensory: Reduced perception of tactile sensation over left extremities compared to right extremities. Deep Tendon Reflexes: 1+ and symmetric. Plantars: Flexor bilaterally Cerebellar: Normal finger-to-nose testing was normal on the right; commensurate with effort and equivocal weakness on the left.    Assessment: 53 y.o. male presenting with probable acute right MCA territory ischemic infarction with possible embolic phenomena from proximal ICA thrombus.  Stroke Risk Factors - none  Plan: 1. HgbA1c, fasting lipid panel 2. MRI, MRA  of the brain without contrast 3. PT consult, OT consult, Speech consult 4. Echocardiogram 5. Carotid dopplers 6. Prophylactic therapy-anticoagulation with IV heparin drip and 7. Risk factor modification 8. Telemetry monitoring  C.R. Roseanne RenoStewart, MD [facility] [phone]  09/21/2013, 10:46 PM

## 2013-09-21 NOTE — ED Notes (Addendum)
Pt presents from home via [facility] with c/o left face feeling "fuzzy" left arm weakness with weaker grip noted to left arm, left leg weakness, and pain in right temple. Pt reports dizziness "Like my equilibrium is off" Pt is alert and interactive at this time. Pt reports has not taken Xarelto in "Months" and has hx PE.

## 2013-09-21 NOTE — ED Notes (Signed)
Pt tolerating CT without issue, VSS

## 2013-09-21 NOTE — ED Notes (Signed)
This RN transported pt back to CT for CT angio testing

## 2013-09-21 NOTE — ED Provider Notes (Signed)
CSN: [id number]     Arrival date & time 09/21/13  2220 History   First MD Initiated Contact with Patient 09/21/13 2233     Chief Complaint  Patient presents with  . Code Stroke    An emergency department physician performed an initial assessment on this suspected stroke patient at 2221.  Patient is a 52 y.o. male presenting with weakness. The history is provided by the patient and the EMS personnel. The history is limited by the condition of the patient.  Weakness This is a new problem. The current episode started 6 to 12 hours ago. The problem occurs constantly. Associated symptoms include headaches. Pertinent negatives include no chest pain. Nothing aggravates the symptoms. Nothing relieves the symptoms.  pt presents for headache, left sided weakness and also visual loss.   This started at approximately 1600   Past Medical History  Diagnosis Date  . Depression   . Degenerative disc disease, cervical     Dx years ago  . CAD (coronary artery disease) 02/2012    NSTEMI - PCI to prox LAD  . Presence of drug coated stent in LAD coronary artery 02/2012    Promus Element 4.0 mm x 28 mm  . MI (myocardial infarction) 02/02/2012    Normal 2D Echo - EF-60  . Hematoma, s/p cath -rt wrist 12/22/2012  . Chest pain, stable cardiac cath 12/2012 12/22/2012  . NSTEMI (non-ST elevated myocardial infarction), 07/18/13, stable cath with patent stent. 01/31/2012  . Chronic anticoagulation, since 03/2013 for PE 07/19/2013   Past Surgical History  Procedure Laterality Date  . Tumor removal      left foot third toe  . Anterior cervical decomp/discectomy fusion  11/28/2011    Procedure: ANTERIOR CERVICAL DECOMPRESSION/DISCECTOMY FUSION 1 LEVEL;  Surgeon: Temple Pacini, MD;  Location: [facility];  Service: Neurosurgery;  Laterality: N/A;  Anterior Cervical Six-Seven Decompression and Fusion  . Cardiac catheterization  01/31/2012    Promeus Element DES 4 x 28, balloon-Emerge monorail  . Cardiac catheterization   12/17/12    patent stent, stable CAD   Family History  Problem Relation Age of Onset  . Anesthesia problems Neg Hx   . Hypotension Neg Hx   . Malignant hyperthermia Neg Hx   . Pseudochol deficiency Neg Hx   . Coronary artery disease Father    History  Substance Use Topics  . Smoking status: Current Every Day Smoker -- 0.25 packs/day for 42 years    Types: Cigarettes    Start date: 01/12/1971  . Smokeless tobacco: Never Used  . Alcohol Use: Yes     Comment: Report being abstinent since ~Sept 2013    Review of Systems  Unable to perform ROS: Acuity of condition  Cardiovascular: Negative for chest pain.  Neurological: Positive for weakness and headaches.      Allergies  Ibuprofen  Home Medications   Current Outpatient Rx  Name  Route  Sig  Dispense  Refill  . Ascorbic Acid (VITAMIN C) 1000 MG tablet   Oral   Take 1,000 mg by mouth daily.         Marland Kitchen atorvastatin (LIPITOR) 80 MG tablet   Oral   Take 80 mg by mouth daily at 6 PM.         . colchicine 0.6 MG tablet   Oral   Take 1 tablet (0.6 mg total) by mouth daily.   21 tablet   0   . folic acid (FOLVITE) 1 MG tablet  Oral   Take 1 mg by mouth daily.         Marland Kitchen. HYDROcodone-acetaminophen (NORCO) 5-325 MG per tablet   Oral   Take 1 tablet by mouth every 12 (twelve) hours as needed for pain.   45 tablet   2   . lisinopril (PRINIVIL,ZESTRIL) 5 MG tablet   Oral   Take 5 mg by mouth daily.         . metoprolol tartrate (LOPRESSOR) 12.5 mg TABS tablet   Oral   Take 0.5 tablets (12.5 mg total) by mouth 2 (two) times daily.   60 tablet   11   . mometasone-formoterol (DULERA) 100-5 MCG/ACT AERO   Inhalation   Inhale 2 puffs into the lungs 2 (two) times daily as needed for wheezing or shortness of breath.         . nitroGLYCERIN (NITROSTAT) 0.4 MG SL tablet   Sublingual   Place 0.4 mg under the tongue every 5 (five) minutes as needed for chest pain.         Marland Kitchen. PARoxetine (PAXIL) 10 MG tablet    Oral   Take 10 mg by mouth every morning.         . predniSONE (STERAPRED UNI-PAK) 10 MG tablet   Oral   Take by mouth taper from 4 doses each day to 1 dose and stop. 4 daily x 2 days, three times daily x 3 days, two times daily x 3 days, one time daily x 3 days.  STOP.   26 tablet   0   . QUEtiapine (SEROQUEL XR) 300 MG 24 hr tablet   Oral   Take 300 mg by mouth at bedtime.         . Rivaroxaban (XARELTO) 20 MG TABS tablet   Oral   Take 1 tablet (20 mg total) by mouth daily with supper.   30 tablet   5   . Ticagrelor (BRILINTA) 90 MG TABS tablet   Oral   Take 1 tablet (90 mg total) by mouth 2 (two) times daily.   30 tablet   11   . traMADol (ULTRAM) 50 MG tablet   Oral   Take 1 tablet (50 mg total) by mouth every 6 (six) hours as needed for pain.   30 tablet   0    BP 128/70  Pulse 79  Temp(Src) 98.5 F (36.9 C) (Oral)  Resp 23  Ht 6' 4.5" (1.943 m)  Wt 189 lb 6 oz (85.9 kg)  BMI 22.75 kg/m2  SpO2 97% Physical Exam CONSTITUTIONAL: Well developed/well nourished HEAD: Normocephalic/atraumatic EYES: EOMI/PERRL ENMT: Mucous membranes moist NECK: supple no meningeal signs CV: S1/S2 noted, no murmurs/rubs/gallops noted LUNGS: Lungs are clear to auscultation bilaterally, no apparent distress ABDOMEN: soft, nontender, no rebound or guarding NEURO: Pt is awake/alert, left facial droop noted on my evaluation. He reports numbness to left face.  He reports visual field deficit.  He flaps his left arm when asked to lift arms.   EXTREMITIES: pulses normal, full ROM SKIN: warm, color normal PSYCH: no abnormalities of mood noted  ED Course  Procedures Pt seen on arrival for possible code stroke, last known well at 1600 He was cleared for CT imaging and rapid assessment was done on arrival CT head negative.  Seen by dr Roseanne Renostewart with neurology  After evaluation, it was determined he is not a TPA candidate but would benefit from admission for further stroke workup  tPA  in stroke considered but not given  due to:  Onset over 3-4.5hours  11:50 PM D/w resident on call for internal medicine Will admit to neuro tele Labs Review Labs Reviewed  CBC - Abnormal; Notable for the following:    WBC 14.2 (*)    MCHC 36.2 (*)    All other components within normal limits  DIFFERENTIAL - Abnormal; Notable for the following:    Neutrophils Relative % 81 (*)    Neutro Abs 11.5 (*)    All other components within normal limits  COMPREHENSIVE METABOLIC PANEL - Abnormal; Notable for the following:    Glucose, Bld 160 (*)    Creatinine, Ser 1.50 (*)    GFR calc non Af Amer 53 (*)    GFR calc Af Amer 61 (*)    All other components within normal limits  GLUCOSE, CAPILLARY - Abnormal; Notable for the following:    Glucose-Capillary 155 (*)    All other components within normal limits  POCT I-STAT, CHEM 8 - Abnormal; Notable for the following:    Creatinine, Ser 1.50 (*)    Glucose, Bld 160 (*)    Hemoglobin 17.7 (*)    All other components within normal limits  PROTIME-INR  APTT  TROPONIN I  POCT I-STAT TROPONIN I   Imaging Review Ct Head (brain) Wo Contrast  09/21/2013   CLINICAL DATA:  Could stroke, left-sided weakness  EXAM: CT HEAD WITHOUT CONTRAST  TECHNIQUE: Contiguous axial images were obtained from the base of the skull through the vertex without intravenous contrast.  COMPARISON:  None.  FINDINGS: No acute intracranial hemorrhage. No focal mass lesion. No CT evidence of acute infarction. No midline shift or mass effect. No hydrocephalus. Basilar cisterns are patent.  Paranasal sinuses and mastoid air cells are clear.  IMPRESSION: 1.  No acute intracranial findings. 2. No CT evidence of acute infarction. 3. No evidence of intracranial hemorrhage. Findings conveyed toDr. Gillermo Murdoch 09/21/2013  at22:37.   Electronically Signed   By: Genevive Bi M.D.   On: 09/21/2013 22:38    EKG Interpretation    Date/Time:  Wednesday September 21 2013 22:37:05  EST Ventricular Rate:  70 PR Interval:  158 QRS Duration: 82 QT Interval:  389 QTC Calculation: 420 R Axis:   76 Text Interpretation:  Sinus arrhythmia Ventricular premature complex Biatrial enlargement ST elev, probable normal early repol pattern Confirmed by Bebe Shaggy  MD, Cheyanna Strick [phone]) on 09/21/2013 10:44:19 PM            MDM   Final diagnoses:  None    Nursing notes including past medical history and social history reviewed and considered in documentation Labs/vital reviewed and considered     Joya Gaskins, MD 09/21/13 2351

## 2013-09-22 ENCOUNTER — Inpatient Hospital Stay (HOSPITAL_COMMUNITY): Payer: Medicaid Other

## 2013-09-22 ENCOUNTER — Encounter (HOSPITAL_COMMUNITY): Payer: Self-pay | Admitting: Internal Medicine

## 2013-09-22 DIAGNOSIS — D72829 Elevated white blood cell count, unspecified: Secondary | ICD-10-CM

## 2013-09-22 DIAGNOSIS — I2510 Atherosclerotic heart disease of native coronary artery without angina pectoris: Secondary | ICD-10-CM

## 2013-09-22 DIAGNOSIS — I517 Cardiomegaly: Secondary | ICD-10-CM

## 2013-09-22 DIAGNOSIS — G8190 Hemiplegia, unspecified affecting unspecified side: Secondary | ICD-10-CM

## 2013-09-22 DIAGNOSIS — R209 Unspecified disturbances of skin sensation: Secondary | ICD-10-CM

## 2013-09-22 DIAGNOSIS — N183 Chronic kidney disease, stage 3 unspecified: Secondary | ICD-10-CM

## 2013-09-22 DIAGNOSIS — F3289 Other specified depressive episodes: Secondary | ICD-10-CM

## 2013-09-22 DIAGNOSIS — F1210 Cannabis abuse, uncomplicated: Secondary | ICD-10-CM

## 2013-09-22 DIAGNOSIS — F329 Major depressive disorder, single episode, unspecified: Secondary | ICD-10-CM

## 2013-09-22 DIAGNOSIS — F1410 Cocaine abuse, uncomplicated: Secondary | ICD-10-CM

## 2013-09-22 LAB — LIPID PANEL
Cholesterol: 154 mg/dL (ref 0–200)
HDL: 43 mg/dL (ref >39)
LDL Cholesterol: 92 mg/dL (ref 0–99)
Total CHOL/HDL Ratio: 3.6 RATIO
Triglycerides: 93 mg/dL (ref <150)
VLDL: 19 mg/dL (ref 0–40)

## 2013-09-22 LAB — BASIC METABOLIC PANEL
BUN: 16 mg/dL (ref 6–23)
CO2: 22 mEq/L (ref 19–32)
Calcium: 8.4 mg/dL (ref 8.4–10.5)
Chloride: 105 mEq/L (ref 96–112)
Creatinine, Ser: 1.34 mg/dL (ref 0.50–1.35)
GFR calc Af Amer: 70 mL/min — ABNORMAL LOW (ref >90)
GFR, EST NON AFRICAN AMERICAN: 60 mL/min — AB (ref >90)
GLUCOSE: 108 mg/dL — AB (ref 70–99)
POTASSIUM: 3.9 meq/L (ref 3.7–5.3)
Sodium: 139 mEq/L (ref 137–147)

## 2013-09-22 LAB — CBC
HCT: 44.7 % (ref 39.0–52.0)
HEMOGLOBIN: 15.9 g/dL (ref 13.0–17.0)
MCH: 32 pg (ref 26.0–34.0)
MCHC: 35.6 g/dL (ref 30.0–36.0)
MCV: 89.9 fL (ref 78.0–100.0)
Platelets: 269 10*3/uL (ref 150–400)
RBC: 4.97 MIL/uL (ref 4.22–5.81)
RDW: 13.4 % (ref 11.5–15.5)
WBC: 14.6 10*3/uL — ABNORMAL HIGH (ref 4.0–10.5)

## 2013-09-22 LAB — RAPID URINE DRUG SCREEN, HOSP PERFORMED
AMPHETAMINES: NOT DETECTED
BENZODIAZEPINES: NOT DETECTED
Barbiturates: NOT DETECTED
COCAINE: NOT DETECTED
OPIATES: NOT DETECTED
Tetrahydrocannabinol: POSITIVE — AB

## 2013-09-22 LAB — TROPONIN I
Troponin I: <0.3 ng/mL (ref <0.30)
Troponin I: <0.3 ng/mL (ref <0.30)

## 2013-09-22 LAB — HEPARIN LEVEL (UNFRACTIONATED)
Heparin Unfractionated: 0.21 IU/mL — ABNORMAL LOW (ref 0.30–0.70)
Heparin Unfractionated: 0.44 IU/mL (ref 0.30–0.70)

## 2013-09-22 LAB — TSH: TSH: 0.645 u[IU]/mL (ref 0.350–4.500)

## 2013-09-22 LAB — HEMOGLOBIN A1C
HEMOGLOBIN A1C: 5.6 % (ref <5.7)
MEAN PLASMA GLUCOSE: 114 mg/dL (ref <117)

## 2013-09-22 MED ORDER — HEPARIN (PORCINE) IN NACL 100-0.45 UNIT/ML-% IJ SOLN
1150.0000 [IU]/h | INTRAMUSCULAR | Status: DC
  Administered 2013-09-22 (×2): 1150 [IU]/h via INTRAVENOUS
  Administered 2013-09-22: 1000 [IU]/h via INTRAVENOUS
  Filled 2013-09-22 (×3): qty 250

## 2013-09-22 MED ORDER — ONDANSETRON HCL 4 MG PO TABS
4.0000 mg | ORAL_TABLET | Freq: Four times a day (QID) | ORAL | Status: DC | PRN
  Administered 2013-09-22: 4 mg via ORAL
  Filled 2013-09-22: qty 1

## 2013-09-22 MED ORDER — SENNOSIDES-DOCUSATE SODIUM 8.6-50 MG PO TABS: 1.0000 | ORAL_TABLET | Freq: Every evening | ORAL | Status: DC | PRN

## 2013-09-22 MED ORDER — ATORVASTATIN CALCIUM 80 MG PO TABS
80.0000 mg | ORAL_TABLET | Freq: Every day | ORAL | Status: DC
  Administered 2013-09-22 – 2013-09-26 (×5): 80 mg via ORAL
  Filled 2013-09-22 (×6): qty 1

## 2013-09-22 MED ORDER — ACETAMINOPHEN 650 MG RE SUPP: 650.0000 mg | Freq: Four times a day (QID) | RECTAL | Status: DC | PRN

## 2013-09-22 MED ORDER — LORAZEPAM 2 MG/ML IJ SOLN
0.5000 mg | Freq: Once | INTRAMUSCULAR | Status: AC | PRN
  Administered 2013-09-22: 0.5 mg via INTRAVENOUS
  Filled 2013-09-22: qty 1

## 2013-09-22 MED ORDER — ADULT MULTIVITAMIN W/MINERALS CH
1.0000 | ORAL_TABLET | Freq: Every day | ORAL | Status: DC
  Administered 2013-09-22 – 2013-09-27 (×6): 1 via ORAL
  Filled 2013-09-22 (×7): qty 1

## 2013-09-22 MED ORDER — LORAZEPAM 2 MG/ML IJ SOLN
INTRAMUSCULAR | Status: AC
  Administered 2013-09-22: 1 mg via INTRAVENOUS
  Filled 2013-09-22: qty 1

## 2013-09-22 MED ORDER — INFLUENZA VAC SPLIT QUAD 0.5 ML IM SUSP
0.5000 mL | INTRAMUSCULAR | Status: AC
  Administered 2013-09-23: 0.5 mL via INTRAMUSCULAR
  Filled 2013-09-22: qty 0.5

## 2013-09-22 MED ORDER — ENSURE COMPLETE PO LIQD
237.0000 mL | Freq: Every day | ORAL | Status: DC | PRN
  Administered 2013-09-24: 237 mL via ORAL

## 2013-09-22 MED ORDER — TICAGRELOR 90 MG PO TABS
90.0000 mg | ORAL_TABLET | Freq: Two times a day (BID) | ORAL | Status: DC
  Administered 2013-09-22 – 2013-09-26 (×10): 90 mg via ORAL
  Filled 2013-09-22 (×12): qty 1

## 2013-09-22 MED ORDER — ONDANSETRON HCL 4 MG/2ML IJ SOLN: 4.0000 mg | Freq: Four times a day (QID) | INTRAMUSCULAR | Status: DC | PRN

## 2013-09-22 MED ORDER — ACETAMINOPHEN 325 MG PO TABS
650.0000 mg | ORAL_TABLET | Freq: Four times a day (QID) | ORAL | Status: DC | PRN
  Administered 2013-09-22 (×4): 650 mg via ORAL
  Filled 2013-09-22 (×5): qty 2

## 2013-09-22 MED ORDER — SODIUM CHLORIDE 0.9 % IJ SOLN
3.0000 mL | Freq: Two times a day (BID) | INTRAMUSCULAR | Status: DC
  Administered 2013-09-22 – 2013-09-25 (×6): 3 mL via INTRAVENOUS

## 2013-09-22 MED ORDER — MOMETASONE FURO-FORMOTEROL FUM 100-5 MCG/ACT IN AERO
2.0000 | INHALATION_SPRAY | Freq: Two times a day (BID) | RESPIRATORY_TRACT | Status: DC
  Administered 2013-09-22 – 2013-09-27 (×10): 2 via RESPIRATORY_TRACT
  Filled 2013-09-22 (×2): qty 8.8

## 2013-09-22 MED ORDER — LORAZEPAM 2 MG/ML IJ SOLN
1.0000 mg | Freq: Once | INTRAMUSCULAR | Status: AC
  Administered 2013-09-22: 1 mg via INTRAVENOUS

## 2013-09-22 NOTE — Code Documentation (Signed)
53 yo b[facility] for sudden onset Lt side weakness & numbness.  Pt reports getting in bed at 1600 to take a nap & unable to go to sleep.  Around 1630 the developed a Rt temporal HA & Lt facial tingling & his Lt arm felt "not right".  Pt repeatedly states he feels strange & out of sorts.  NIH 10 for Lt visual loss, Lt side weakness & sensory deficit, Lt extinction.  See doc flowsheet for code stroke times.

## 2013-09-22 NOTE — Progress Notes (Signed)
Stroke Team Progress Note  HISTORY Timothy Peck is an 53 y.o. male with a history of coronary artery disease, myocardial infarction, pulmonary embolism and depression, presenting 09/21/2013 with new onset weakness involving his left side as well as numbness. Symptoms were associated with feeling of disequilibrium at the onset. No previous history of stroke nor TIA. Patient has not been compliant with anticoagulation and has not taken Xarelto no for several months. Patient also admits that he has not been taking Brilinta, as well. CT scan of his head showed no acute intracranial abnormality. CTA of the head and neck showed a hematoma at the origin of the right ICA causing a 50% stenosis. Study was otherwise unremarkable. No intracranial vascular abnormality was seen. NIH stroke score was 10. He was last known well at 1600 09/21/2013. Patient was not administerd TPA secondary to delay in arrival. He was admitted for further evaluation and treatment.  SUBJECTIVE no family is at the bedside.  Overall he feels his condition is stable.   OBJECTIVE Most recent Vital Signs: Filed Vitals:   09/22/13 0045 09/22/13 0108 09/22/13 0524 09/22/13 0800  BP: 130/96 141/68 127/68 136/73  Pulse: 77 57 74 74  Temp:  98.6 F (37 C) 98.3 F (36.8 C) 97.9 F (36.6 C)  TempSrc:    Oral  Resp: 20 18 18 18   Height:  6' 4.5" (1.943 m)    Weight:  86.637 kg (191 lb)    SpO2: 99% 97% 98% 97%   CBG (last 3)   Recent Labs  09/21/13 2235  GLUCAP 155*    IV Fluid Intake:   . heparin 1,000 Units/hr (09/22/13 0128)    MEDICATIONS  . atorvastatin  80 mg Oral q1800  . [START ON 09/23/2013] influenza vac split quadrivalent PF  0.5 mL Intramuscular Tomorrow-1000  . mometasone-formoterol  2 puff Inhalation BID  . multivitamin with minerals  1 tablet Oral Daily  . sodium chloride  3 mL Intravenous Q12H  . Ticagrelor  90 mg Oral BID   PRN:  acetaminophen, acetaminophen, feeding supplement (ENSURE COMPLETE),  ondansetron (ZOFRAN) IV, ondansetron, senna-docusate  Diet:  Cardiac thin liquids Activity:  Up with assistance DVT Prophylaxis:  IV heparin  CLINICALLY SIGNIFICANT STUDIES Basic Metabolic Panel:  Recent Labs Lab 09/21/13 2222 09/21/13 2228 09/22/13 0540  NA 138 142 139  K 4.1 3.9 3.9  CL 101 99 105  CO2 27  --  22  GLUCOSE 160* 160* 108*  BUN 17 17 16   CREATININE 1.50* 1.50* 1.34  CALCIUM 8.5  --  8.4   Liver Function Tests:  Recent Labs Lab 09/21/13 2222  AST 25  ALT 30  ALKPHOS 46  BILITOT 0.3  PROT 6.1  ALBUMIN 3.5   CBC:  Recent Labs Lab 09/21/13 2222 09/21/13 2228 09/22/13 0540  WBC 14.2*  --  14.6*  NEUTROABS 11.5*  --   --   HGB 17.0 17.7* 15.9  HCT 46.9 52.0 44.7  MCV 89.5  --  89.9  PLT 299  --  269   Coagulation:  Recent Labs Lab 09/21/13 2222  LABPROT 13.3  INR 1.03   Cardiac Enzymes:  Recent Labs Lab 09/21/13 2222 09/22/13 0540 09/22/13 0744  TROPONINI <0.30 <0.30 <0.30   Urinalysis: No results found for this basename: COLORURINE, APPERANCEUR, LABSPEC, PHURINE, GLUCOSEU, HGBUR, BILIRUBINUR, KETONESUR, PROTEINUR, UROBILINOGEN, NITRITE, LEUKOCYTESUR,  in the last 168 hours Lipid Panel    Component Value Date/Time   CHOL 154 09/22/2013 0540   TRIG 93  09/22/2013 0540   HDL 43 09/22/2013 0540   CHOLHDL 3.6 09/22/2013 0540   VLDL 19 09/22/2013 0540   LDLCALC 92 09/22/2013 0540   HgbA1C  No results found for this basename: HGBA1C    Urine Drug Screen:     Component Value Date/Time   LABOPIA NONE DETECTED 09/22/2013 0035   COCAINSCRNUR NONE DETECTED 09/22/2013 0035   LABBENZ NONE DETECTED 09/22/2013 0035   AMPHETMU NONE DETECTED 09/22/2013 0035   THCU POSITIVE* 09/22/2013 0035   LABBARB NONE DETECTED 09/22/2013 0035    Alcohol Level: No results found for this basename: ETH,  in the last 168 hours   CT of the brain  09/21/2013    1.  No acute intracranial findings. 2. No CT evidence of acute infarction. 3. No evidence of intracranial  hemorrhage.   CT angio head 09/21/2013 No large vessel occlusion or hemodynamically significant stenosis.    CT angio neck 09/21/2013 Eccentric right internal carotid artery origin intimal hematoma resulting in 50% narrowing of the right ICA origin.    MRI of the brain    MRA of the brain  See CT angio  2D Echocardiogram    Carotid Doppler  Preliminary findings: Right:There appears to be moderate to severe homogeneous plaque vs. thrombus noted in the proximal ICA. Bilateral: 1-39% ICA stenosis. Vertebral artery flow is antegrade.  CXR  07/18/2014 No active cardiopulmonary disease.  EKG  Sinus arrhythmia. For complete results please see formal report.   Therapy Recommendations   Physical Exam   Young healthy looking African American male who is sleepy but arousable.Awake alert. Afebrile. Head is nontraumatic. Neck is supple without bruit. Hearing is normal. Cardiac exam no murmur or gallop. Lungs are clear to auscultation. Distal pulses are well felt. Neurological Exam : Drowsy but can arouse and follows simple commands. Speech is slight hesitant but fluent. Able to name and repeat. Follows commands. Slight left gaze preference but able to look to the right past midline. Blinks to threat bilaterally. Minimum right lower facial asymmetry. Tongue midline. Moves both upper and lower extremity   well against gravity. No focal weakness except right grip strength and fine finger movements and diminished. Plantars downgoing. ASSESSMENT [name] is a 53 y.o. male presenting with dizziness, left-sided weakness and numbness. 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 Stroke imaging pending. On Brilinta prior to admission. Now on heparin for secondary stroke prevention. Patient with resultant left hemiparesis and dizziness, left hemisensory deficit. Work up underway.   History of pulmonary embolus in August 2014, anticoagulated with Xarelto, patient  noncompliant with medication for several months.  LDL 92  THC positive, this is a risk factor for stroke. History of cocaine use, urine drug screen negative this admission.  Coronary artery disease-MI, angioplasty to the proximal LAD, stent placement 2013  Cigarette smoker   Hospital day # 1  TREATMENT/PLAN  Continue heparin for now for secondary stroke prevention.  followup hemoglobin A1c  Stat MRI to look at size of stroke. If large, will likely recommend discontinuation of IV heparin due to risk of hemorrhage. I altered the order accordingly.  No indication for MRA as CT angiography completed  Follow up reset of stroke workup  Annie MainSHARON BIBY, MSN, RN, ANVP-BC, ANP-BC, GNP-BC [facility] Pager: [phone] 09/22/2013 12:01 PM  I have personally obtained a history, examined the patient, evaluated imaging results, and formulated the assessment and plan of care. I agree with the above.  Pramod  Pearlean Brownie, MD

## 2013-09-22 NOTE — H&P (Signed)
  Date: 09/22/2013  Patient name: Timothy Peck Hackmann  Medical record number: [id number]  Date of birth: 03/25/1961   I have seen and evaluated Timothy Peck Fariss and discussed their care with the Residency Team.   Assessment and Plan: I have seen and evaluated the patient as outlined above. I agree with the formulated Assessment and Plan as detailed in the residents' admission note.    Gardiner Barefootobert W Comer, MD 2/19/201510:00 AM

## 2013-09-22 NOTE — Evaluation (Signed)
Speech Language Pathology Evaluation Patient Details Name: Timothy Peck MRN: [id number] DOB: 10/02/1960 Today's Date: 09/22/2013 Time: 4098-1191 SLP Time Calculation (min): 10 min  Problem List:  Patient Active Problem List   Diagnosis Date Noted  . Acute ischemic stroke 09/21/2013  . Chronic anticoagulation, since 03/2013 for PE 07/19/2013  . Acute pericarditis, unspecified 07/19/2013    Class: Question of  . Chest pain, musculoskeletal 07/18/2013  . Pericarditis 07/18/2013  . Pulmonary embolism and infarction, 03/13/13, on Xarelto 03/14/2013  . Acute respiratory failure with hypoxia 03/14/2013  . Blood in stool, resolved 01/11/2013  . Chest pain, stable cardiac cath 12/2012 12/22/2012  . CAD S/P percutaneous coronary angioplasty 02/21/12 non-STEMI -Promus DES 4.0 mm 20 mm to prox LAD 12/14/2012    Class: Diagnosis of  . Dyslipidemia, goal LDL below 70 12/14/2012  . Bradycardia 02/03/2012  . NSTEMI (non-ST elevated myocardial infarction), 07/18/13, stable cath with patent stent. 01/31/2012  . Substance abuse 01/31/2012  . Alcohol abuse 01/31/2012  . Tobacco abuse 01/31/2012  . Herniation of cervical intervertebral disc with radiculopathy 11/28/2011   Past Medical History:  Past Medical History  Diagnosis Date  . Depression   . Degenerative disc disease, cervical     Dx years ago  . CAD (coronary artery disease) 02/2012    NSTEMI - PCI to prox LAD  . Presence of drug coated stent in LAD coronary artery 02/2012    Promus Element 4.0 mm x 28 mm  . MI (myocardial infarction) 02/02/2012    Normal 2D Echo - EF-60  . Hematoma, s/p cath -rt wrist 12/22/2012  . Chest pain, stable cardiac cath 12/2012 12/22/2012  . NSTEMI (non-ST elevated myocardial infarction), 07/18/13, stable cath with patent stent. 01/31/2012  . Chronic anticoagulation, since 03/2013 for PE 07/19/2013   Past Surgical History:  Past Surgical History  Procedure Laterality Date  . Tumor removal      left foot third toe   . Anterior cervical decomp/discectomy fusion  11/28/2011    Procedure: ANTERIOR CERVICAL DECOMPRESSION/DISCECTOMY FUSION 1 LEVEL;  Surgeon: Temple Pacini, MD;  Location: [facility];  Service: Neurosurgery;  Laterality: N/A;  Anterior Cervical Six-Seven Decompression and Fusion  . Cardiac catheterization  01/31/2012    Promeus Element DES 4 x 28, balloon-Emerge monorail  . Cardiac catheterization  12/17/12    patent stent, stable CAD   HPI:  53 year old male with past medical history of CAD s/p stent, MI in 2013, PE 03/2013 on xarelto, tobacco abuse, substance abuse but quit  a year ago and medical noncompliance for at least 2 months who presents with acute onset left sided weakness and numbness.  Pt reports that earlier this afternoon he began having sharp and constant substernal chest pain with diaphoresis, dyspnea, and dizziness as he was walking to the bus stop. He  home and tried to take a nap around 4:30 PM which helped relieve his CP however he then began having severe right sided posterior/temporal located headache with vertigo, nausea, slurred speech, blurry vision, confusion, left face/arm/leg numbness and tingling and weakness with resulting imbalance and incoordination. He denies dysphagia or diplopia.  CT negative and NRI pending.   Assessment / Plan / Recommendation Clinical Impression  Pt. lethargic/groggy at time of evaluation due to Ativan for MRI.  Brief assessment indicated questionable deficits with verbal problem solving, anticipatory awareness and executive functioning.  Pt. states he was felt he was confused when symptoms began.  ST will continue to see for further diagnostic assessment  of cognitive abilities.     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)   IT consultantCognition  Overall Cognitive Status: Impaired/Different from baseline Arousal/Alertness: Lethargic Orientation Level: Oriented to place;Oriented to situation;Oriented to person;Disoriented to time Attention: Sustained Sustained Attention: Impaired Sustained Attention Impairment: Verbal basic (Ativan given earlier today) Memory:  (recalled info from MD re: stroke etiology, assess further) Awareness:  (verbalized intellectual awareness, will assess further) Problem Solving: Impaired Problem Solving Impairment: Verbal basic (minimal deficits) Executive Function: Organizing;Self Monitoring;Self Correcting Safety/Judgment: Impaired    Comprehension  Auditory Comprehension Overall Auditory Comprehension: Appears within functional limits for tasks assessed Interfering Components: Attention Visual Recognition/Discrimination Discrimination: Not tested Reading Comprehension Reading Status:  (TBA)    Expression Expression Primary Mode of Expression: Verbal Verbal Expression Overall Verbal Expression: Appears within functional limits for tasks assessed Written Expression Dominant Hand: Right   Oral / Motor Oral Motor/Sensory Function Overall Oral Motor/Sensory Function: Appears within functional limits for tasks assessed Motor Speech Overall Motor Speech: Appears within functional limits for tasks assessed Intelligibility:  (will eval further) Motor Planning: Witnin functional limits   GO     Breck CoonsLisa Willis [employer] CorporationLitaker M.Ed [employer]-SLP Pager [phone]  09/22/2013

## 2013-09-22 NOTE — Progress Notes (Signed)
INITIAL NUTRITION ASSESSMENT  DOCUMENTATION CODES Per approved criteria  -Not Applicable   INTERVENTION: Provide Ensure Complete BID PRN Provide Multivitamin with minerals daily RD to continue to monitor  NUTRITION DIAGNOSIS: Predicted suboptimal energy intake related to acute stroke as evidenced by pt's reports of nausea and slurred speech on admission.   Goal: Pt to meet >/= 90% of their estimated nutrition needs   Monitor:  PO intake, labs, weight trends  Reason for Assessment: Malnutrition Screening Tool, score of 2  53 y.o. male  Admitting Dx: Acute ischemic stroke  ASSESSMENT: 53 year old male with past medical history of CAD s/p stent, MI in 2013, PE 03/2013 on xarelto, tobacco abuse, and medical noncompliance for at least 2 months who presents with acute onset left sided weakness and numbness.   Per MST, pt reported that he was unsure if he has lost any weight recently. Pt out of room at time of visit. Weight history shows that pt has lost 9 lbs (4.5% of body weight) in the past 2 months; wt loss not significant. Per nursing notes, pt ate 90% of breakfast this morning. Per chart pt smokes 0.5 packs of cigarettes per day. Note, lipid panel WNL. Hemoglobin A1C in process.   Height: Ht Readings from Last 1 Encounters:  09/22/13 6' 4.5" (1.943 m)    Weight: Wt Readings from Last 1 Encounters:  09/22/13 191 lb (86.637 kg)    Ideal Body Weight: 205 lbs  % Ideal Body Weight: 93%  Wt Readings from Last 10 Encounters:  09/22/13 191 lb (86.637 kg)  07/20/13 200 lb 9.9 oz (91 kg)  07/20/13 200 lb 9.9 oz (91 kg)  04/25/13 207 lb 1.6 oz (93.94 kg)  03/15/13 193 lb 9 oz (87.8 kg)  01/11/13 187 lb 6.4 oz (85.004 kg)  12/22/12 186 lb (84.369 kg)  12/17/12 181 lb (82.101 kg)  12/17/12 181 lb (82.101 kg)  12/14/12 181 lb 8 oz (82.328 kg)    Usual Body Weight: unknown  % Usual Body Weight: NA  BMI:  Body mass index is 22.95 kg/(m^2).  Estimated Nutritional  Needs: Kcal: 2200-2400 Protein: 90-105 grams Fluid: 2.2-2.4 L/day  Skin: intact  Diet Order: Cardiac  EDUCATION NEEDS: -Education not appropriate at this time   Intake/Output Summary (Last 24 hours) at 09/22/13 1123 Last data filed at 09/22/13 0700  Gross per 24 hour  Intake    363 ml  Output    200 ml  Net    163 ml    Last BM: 2/18  Labs:   Recent Labs Lab 09/21/13 2222 09/21/13 2228 09/22/13 0540  NA 138 142 139  K 4.1 3.9 3.9  CL 101 99 105  CO2 27  --  22  BUN 17 17 16   CREATININE 1.50* 1.50* 1.34  CALCIUM 8.5  --  8.4  GLUCOSE 160* 160* 108*    CBG (last 3)   Recent Labs  09/21/13 2235  GLUCAP 155*    Scheduled Meds: . atorvastatin  80 mg Oral q1800  . [START ON 09/23/2013] influenza vac split quadrivalent PF  0.5 mL Intramuscular Tomorrow-1000  . mometasone-formoterol  2 puff Inhalation BID  . sodium chloride  3 mL Intravenous Q12H  . Ticagrelor  90 mg Oral BID    Continuous Infusions: . heparin 1,000 Units/hr (09/22/13 0128)    Past Medical History  Diagnosis Date  . Depression   . Degenerative disc disease, cervical     Dx years ago  . CAD (coronary artery disease)  02/2012    NSTEMI - PCI to prox LAD  . Presence of drug coated stent in LAD coronary artery 02/2012    Promus Element 4.0 mm x 28 mm  . MI (myocardial infarction) 02/02/2012    Normal 2D Echo - EF-60  . Hematoma, s/p cath -rt wrist 12/22/2012  . Chest pain, stable cardiac cath 12/2012 12/22/2012  . NSTEMI (non-ST elevated myocardial infarction), 07/18/13, stable cath with patent stent. 01/31/2012  . Chronic anticoagulation, since 03/2013 for PE 07/19/2013    Past Surgical History  Procedure Laterality Date  . Tumor removal      left foot third toe  . Anterior cervical decomp/discectomy fusion  11/28/2011    Procedure: ANTERIOR CERVICAL DECOMPRESSION/DISCECTOMY FUSION 1 LEVEL;  Surgeon: Temple PaciniHenry A Pool, MD;  Location: [facility];  Service: Neurosurgery;  Laterality: N/A;   Anterior Cervical Six-Seven Decompression and Fusion  . Cardiac catheterization  01/31/2012    Promeus Element DES 4 x 28, balloon-Emerge monorail  . Cardiac catheterization  12/17/12    patent stent, stable CAD    Ian Malkineanne Barnett RD, LDN Inpatient Clinical Dietitian Pager: [phone] After Hours Pager: [phone]

## 2013-09-22 NOTE — Progress Notes (Signed)
*  PRELIMINARY RESULTS* Vascular Ultrasound Carotid Duplex (Doppler) has been completed.  Preliminary findings: Right:There appears to be moderate to severe homogeneous plaque vs. thrombus noted in the proximal ICA. Bilateral: 1-39% ICA stenosis. Vertebral artery flow is antegrade.  Cathie BeamsGREGORY, Deserea Bordley 09/22/2013, 11:53 AM

## 2013-09-22 NOTE — Progress Notes (Signed)
ANTICOAGULATION CONSULT NOTE - Follow Up Consult  Pharmacy Consult for Heparin Indication: stroke, proximal right ICA thrombus  Allergies  Allergen Reactions  . Ibuprofen Other (See Comments)    Doctor told him he could not take    Patient Measurements: Height: 6' 4.5" (194.3 cm) Weight: 191 lb (86.637 kg) IBW/kg (Calculated) : 87.95 Heparin Dosing Weight: 87 kg  Vital Signs: Temp: 97.5 F (36.4 C) (02/19 1735) Temp src: Oral (02/19 1735) BP: 109/54 mmHg (02/19 1735) Pulse Rate: 58 (02/19 1735)  Labs:  Recent Labs  09/21/13 2222 09/21/13 2228 09/22/13 0540 09/22/13 0730 09/22/13 0744 09/22/13 1830  HGB 17.0 17.7* 15.9  --   --   --   HCT 46.9 52.0 44.7  --   --   --   PLT 299  --  269  --   --   --   APTT 24  --   --   --   --   --   LABPROT 13.3  --   --   --   --   --   INR 1.03  --   --   --   --   --   HEPARINUNFRC <0.10*  --   --  0.21*  --  0.44  CREATININE 1.50* 1.50* 1.34  --   --   --   TROPONINI <0.30  --  <0.30  --  <0.30  --     Estimated Creatinine Clearance: 80.8 ml/min (by C-G formula based on Cr of 1.34).  Assessment:  Follow up heparin level is therapeutic at 0.44 on 1150 units/hr of heparin. Hx PE in August 2014.  Previously on Xarelto, but not taking prior to admission. RN reports no unusual bleeding or bruising but reports that she found the heparin infusion disconnected when she went into patient's room but thinks it was disconnected for ~ 20-30 minutes.  Goal of Therapy:  Heparin level 0.3-0.5 units/ml Monitor platelets by anticoagulation protocol: Yes   Plan:  1) Continue heparin drip at 1150 units/hr  2) Will check confirmatory heparin level ~ 8 hours to compensate for line being disconnected.  3)  Daily heparin level and CBC while on heparin. 4)  Monitor for s/s of bleeding or bruising.   Vinnie LevelBenjamin Albirda Shiel, PharmD.  Clinical Pharmacist Pager [phone]

## 2013-09-22 NOTE — Progress Notes (Signed)
Echo Lab  2D Echocardiogram completed.  Zebulun Deman L Daunte Oestreich, RDCS 09/22/2013 11:29 AM

## 2013-09-22 NOTE — Progress Notes (Signed)
UR complete.  Lorne Winkels RN, MSN 

## 2013-09-22 NOTE — Progress Notes (Signed)
ANTICOAGULATION CONSULT NOTE - Initial Consult  Pharmacy Consult for heparin Indication: proximal ICA thrombus  Allergies  Allergen Reactions  . Ibuprofen Other (See Comments)    Doctor told him he could not take    Patient Measurements: Height: 6' 4.5" (194.3 cm) Weight: 189 lb 6 oz (85.9 kg) IBW/kg (Calculated) : 87.95  Vital Signs: Temp: 98.5 F (36.9 C) (02/18 2241) Temp src: Oral (02/18 2241) BP: 121/67 mmHg (02/19 0000) Pulse Rate: 74 (02/19 0000)  Labs:  Recent Labs  09/21/13 2222 09/21/13 2228  HGB 17.0 17.7*  HCT 46.9 52.0  PLT 299  --   APTT 24  --   LABPROT 13.3  --   INR 1.03  --   CREATININE 1.50* 1.50*  TROPONINI <0.30  --     Estimated Creatinine Clearance: 71.6 ml/min (by C-G formula based on Cr of 1.5).   Medical History: Past Medical History  Diagnosis Date  . Depression   . Degenerative disc disease, cervical     Dx years ago  . CAD (coronary artery disease) 02/2012    NSTEMI - PCI to prox LAD  . Presence of drug coated stent in LAD coronary artery 02/2012    Promus Element 4.0 mm x 28 mm  . MI (myocardial infarction) 02/02/2012    Normal 2D Echo - EF-60  . Hematoma, s/p cath -rt wrist 12/22/2012  . Chest pain, stable cardiac cath 12/2012 12/22/2012  . NSTEMI (non-ST elevated myocardial infarction), 07/18/13, stable cath with patent stent. 01/31/2012  . Chronic anticoagulation, since 03/2013 for PE 07/19/2013    Assessment: 53yo male c/o face feeling "fuzzy", left-sided weakness, and pain in right temple, reports he hasn't taken Xarelto in months despite PE, now w/ right proximal ICA thrombus, to begin IV heparin requested by neuro.  Goal of Therapy:  Heparin level 0.3-0.5 units/ml Monitor platelets by anticoagulation protocol: Yes   Plan:  Will begin heparin gtt conservatively at 1000 units/hr and monitor heparin levels and CBC.  Vernard GamblesVeronda Domenik Trice, PharmD, BCPS  09/22/2013,12:05 AM

## 2013-09-22 NOTE — H&P (Signed)
Date: 09/22/2013               Patient Name:  Timothy Peck MRN: [id number]  DOB: Mar 27, 1961 Age / Sex: 53 y.o., male   PCP: Pcp Not In System         Medical Service: Internal Medicine Teaching Service         Attending Physician: Dr. Gardiner Barefoot, MD    First Contact: Dr. Carlynn Purl Pager: [phone]  Second Contact: Dr. Dow Adolph Pager: [phone]       After Hours (After 5p/  First Contact Pager: [phone]  weekends / holidays): Second Contact Pager: [phone]   Chief Complaint: left sided weakness and numbness    History of Present Illness:   Timothy Peck is a 53 year old male with past medical history of CAD s/p stent, MI in 2013, PE 03/2013 on xarelto, tobacco abuse, and medical noncompliance for at least 2 months who presents with acute onset left sided weakness and numbness.    Pt reports that earlier this afternoon he began having sharp and constant substernal chest pain with diaphoresis, dyspnea, and dizziness as he was walking to the bus stop. He then went home and tried to take a nap around 4:30 PM which helped relieve his CP however he then began having severe right sided posterior/temporal located headache with vertigo, nausea, slurred speech, blurry vision, confusion, left face/arm/leg numbness and tingling and weakness with resulting imbalance and incoordination. He denies dysphagia or diplopia.   He reports no prior history of stroke however mother died at age 82 of hemorraghic stroke and father also with stroke. He has a history of HL, CAD with stent, and tobacco abuse (10 cigarettes for 42 years). He reports prior drug use (marijuana, cocaine) however reports he quite about a year ago.        He has been out of all his medications including xarelto and brilinta due to inability to fill them for at least the past 2 months. He was on xarelto for PE in 03/2013 thought to be embolic from cardiac source. He denies recent aspirin use.            He reports his headache,  dizziness (spinning sensation), blurry vision, left sided numbness/tingling and weakness are still present but chest pain has resolved. In the ED (presented around 10:30 PM), CT head w/o contrast did not reveal acute abnormality. He did not receive tPA since he was outside of the time frame.    Meds:  Current Outpatient Prescriptions    Ascorbic Acid (VITAMIN C) 1000 MG tablet  Take 1,000 mg by mouth daily.    folic acid (FOLVITE) 1 MG tablet  Take 1 mg by mouth daily.    HYDROcodone-acetaminophen (NORCO) 5-325 MG per tablet  Take 1 tablet by mouth every 12 (twelve) hours as needed for pain.     lisinopril (PRINIVIL,ZESTRIL) 5 MG tablet  Take 5 mg by mouth daily.   metoprolol tartrate (LOPRESSOR) 12.5 mg TABS tablet  Take 0.5 tablets (12.5 mg total) by mouth 2 (two) times daily.   mometasone-formoterol (DULERA) 100-5 MCG/ACT AERO  Inhale 2 puffs into the lungs 2 (two) times daily as needed for wheezing or shortness of breath.   nitroGLYCERIN (NITROSTAT) 0.4 MG SL tablet  Place 0.4 mg under the tongue every 5 (five) minutes as needed for chest pain.    PARoxetine (PAXIL) 10 MG tablet  Take 10 mg by mouth every morning.    QUEtiapine (SEROQUEL XR)  300 MG 24 hr tablet  Take 300 mg by mouth at bedtime.   Ticagrelor (BRILINTA) 90 MG TABS tablet  Take 1 tablet (90 mg total) by mouth 2 (two) times daily.   traMADol (ULTRAM) 50 MG tablet  Take 1 tablet (50 mg total) by mouth every 6 (six) hours as needed for pain.     Current Facility-Administered Medications  Medication Dose Route Frequency Provider Last Rate Last Dose  . heparin ADULT infusion 100 units/mL (25000 units/250 mL)  1,000 Units/hr Intravenous Continuous Colleen Can, RPH 10 mL/hr at 09/22/13 0128 1,000 Units/hr at 09/22/13 0128  . [START ON 09/23/2013] influenza vac split quadrivalent PF (FLUARIX) injection 0.5 mL  0.5 mL Intramuscular Tomorrow-1000 Gardiner Barefoot, MD        Allergies: Allergies as of  09/21/2013 - Review Complete 09/21/2013  Allergen Reaction Noted  . Ibuprofen Other (See Comments) 02/21/2012   Past Medical History  Diagnosis Date  . Depression   . Degenerative disc disease, cervical     Dx years ago  . CAD (coronary artery disease) 02/2012    NSTEMI - PCI to prox LAD  . Presence of drug coated stent in LAD coronary artery 02/2012    Promus Element 4.0 mm x 28 mm  . MI (myocardial infarction) 02/02/2012    Normal 2D Echo - EF-60  . Hematoma, s/p cath -rt wrist 12/22/2012  . Chest pain, stable cardiac cath 12/2012 12/22/2012  . NSTEMI (non-ST elevated myocardial infarction), 07/18/13, stable cath with patent stent. 01/31/2012  . Chronic anticoagulation, since 03/2013 for PE 07/19/2013   Past Surgical History  Procedure Laterality Date  . Tumor removal      left foot third toe  . Anterior cervical decomp/discectomy fusion  11/28/2011    Procedure: ANTERIOR CERVICAL DECOMPRESSION/DISCECTOMY FUSION 1 LEVEL;  Surgeon: Temple Pacini, MD;  Location: [facility];  Service: Neurosurgery;  Laterality: N/A;  Anterior Cervical Six-Seven Decompression and Fusion  . Cardiac catheterization  01/31/2012    Promeus Element DES 4 x 28, balloon-Emerge monorail  . Cardiac catheterization  12/17/12    patent stent, stable CAD   Family History  Problem Relation Age of Onset  . Anesthesia problems Neg Hx   . Hypotension Neg Hx   . Malignant hyperthermia Neg Hx   . Pseudochol deficiency Neg Hx   . Coronary artery disease Father   . Aneurysm Mother 45    intracranial aneurysm rupture  . Stroke Father 73   History   Social History  . Marital Status: Divorced    Spouse Name: N/A    Number of Children: N/A  . Years of Education: N/A   Occupational History  . Not on file.   Social History Main Topics  . Smoking status: Current Every Day Smoker -- 0.50 packs/day for 42 years    Types: Cigarettes    Start date: 01/12/1971  . Smokeless tobacco: Never Used  . Alcohol Use: No      Comment: Report being abstinent since ~January 2015  . Drug Use: Yes    Special: Cocaine, Marijuana, Other-see comments  . Sexual Activity: Yes    Birth Control/ Protection: None   Other Topics Concern  . Not on file   Social History Narrative  . No narrative on file    Review of Systems: Review of Systems  Constitutional: Positive for diaphoresis (resolved). Negative for fever, chills, weight loss and malaise/fatigue.  HENT: Negative for congestion and sore throat.   Eyes:  Positive for blurred vision. Negative for double vision.  Respiratory: Negative for cough and shortness of breath.   Cardiovascular: Positive for chest pain (resolved). Negative for palpitations and leg swelling.  Gastrointestinal: Positive for nausea and constipation. Negative for vomiting, abdominal pain, diarrhea, blood in stool and melena.  Genitourinary: Negative for dysuria, urgency, frequency and hematuria.  Musculoskeletal: Negative for neck pain.  Neurological: Positive for dizziness, tingling, sensory change, speech change, focal weakness and headaches (right posterior and temporal located ).  Psychiatric/Behavioral: Negative for substance abuse.       Confusion    Physical Exam: Blood pressure 141/68, pulse 57, temperature 98.6 F (37 C), temperature source Oral, resp. rate 18, height 6' 4.5" (1.943 m), weight 86.637 kg (191 lb), SpO2 97.00%. Physical Exam  Constitutional: He is oriented to person, place, and time. He appears well-developed and well-nourished. No distress.  HENT:  Head: Normocephalic and atraumatic.  Right Ear: External ear normal.  Left Ear: External ear normal.  Nose: Nose normal.  Mouth/Throat: Oropharynx is clear and moist. No oropharyngeal exudate.  Eyes: Conjunctivae and EOM are normal. Pupils are equal, round, and reactive to light. Right eye exhibits no discharge. Left eye exhibits no discharge. No scleral icterus.  Decreased vision, squinting eyes  Neck: Normal range of  motion. Neck supple.  Cardiovascular: Normal rate, regular rhythm and normal heart sounds.   Pulmonary/Chest: Effort normal and breath sounds normal. No respiratory distress. He has no wheezes. He has no rales. He exhibits tenderness.  Abdominal: Soft. Bowel sounds are normal. He exhibits no distension. There is tenderness (left LLQ). There is no rebound and no guarding.  Musculoskeletal: Normal range of motion. He exhibits no edema and no tenderness.  Neurological: He is alert and oriented to person, place, and time. A cranial nerve deficit (decreased facial senation to touch on left, right gaze preference,  left facial weakness ) is present.  Left sided neglect Tremors of left hand and arm with movement   Strength 4/5 Left Arm <leg, otherwise 5/5 throughout Decreased sensation to light touch of left face/arm/legs Positive Babinski on left Positive pronator drift on left  Finger-to-nose not intact on left  Unable to assess gait  Skin: Skin is warm and dry. No rash noted. He is not diaphoretic. No erythema. No pallor.  Psychiatric: His behavior is normal. Judgment and thought content normal.     Lab results: Basic Metabolic Panel:  Recent Labs  09/81/19 2222 09/21/13 2228  NA 138 142  K 4.1 3.9  CL 101 99  CO2 27  --   GLUCOSE 160* 160*  BUN 17 17  CREATININE 1.50* 1.50*  CALCIUM 8.5  --    Liver Function Tests:  Recent Labs  09/21/13 2222  AST 25  ALT 30  ALKPHOS 46  BILITOT 0.3  PROT 6.1  ALBUMIN 3.5   CBC:  Recent Labs  09/21/13 2222 09/21/13 2228  WBC 14.2*  --   NEUTROABS 11.5*  --   HGB 17.0 17.7*  HCT 46.9 52.0  MCV 89.5  --   PLT 299  --    Cardiac Enzymes:  Recent Labs  09/21/13 2222  TROPONINI <0.30   CBG:  Recent Labs  09/21/13 2235  GLUCAP 155*  Coagulation:  Recent Labs  09/21/13 2222  LABPROT 13.3  INR 1.03   Urine Drug Screen: Drugs of Abuse     Component Value Date/Time   LABOPIA NONE DETECTED 09/22/2013 0035    COCAINSCRNUR NONE DETECTED 09/22/2013 0035  LABBENZ NONE DETECTED 09/22/2013 0035   AMPHETMU NONE DETECTED 09/22/2013 0035   THCU POSITIVE* 09/22/2013 0035   LABBARB NONE DETECTED 09/22/2013 0035      Imaging results:  Ct Angio Head W/cm &/or Wo Cm  09/21/2013   CLINICAL DATA:  Code stroke. "Fuzzy" left arm weakness and grip. Pain in right temple. Dizziness.  EXAM: CT ANGIOGRAPHY HEAD AND NECK  TECHNIQUE: Multidetector CT imaging of the head and neck was performed using the standard protocol during bolus administration of intravenous contrast. Multiplanar CT image reconstructions and MIPs were obtained to evaluate the vascular anatomy. Carotid stenosis measurements (when applicable) are obtained utilizing NASCET criteria, using the distal internal carotid diameter as the denominator. Please note, due to initial poor bolus timing, repeat angiography was performed.  CONTRAST:  OMNIPAQUE IOHEXOL 350 MG/ML SOLN  COMPARISON:  CT of the head   September 21, 2013 at 2229 hr  FINDINGS: Initial contrast bolus timing was suboptimal, however on the repeat examination with improved arterial opacification, there is marked motion degradation at the upper neck.  CTA HEAD FINDINGS  Anterior Circulation: The bilateral petrous, cavernous and supra clinoid internal carotid arteries are patent, there is mild narrowing of the anterior genu of the right internal carotid artery. Normal appearance of the anterior and middle cerebral arteries.  Posterior circulation: Right vertebral artery is dominant. Normal appearance of the vertebral arteries, basilar junction and basilar artery, including the branch vessels. Small right posterior communicating artery is present. Normal appearance of posterior cerebral arteries.  No large vessel occlusion, aneurysm, high-grade stenosis, suspicious luminal irregularity nor contrast extravasation.  Review of the MIP images confirms the above findings.  CTA NECK FINDINGS  Two vessel aortic arch  appears normal. The origins of the bilateral common carotid arteries appear widely patent. Bilateral common carotid arteries course demonstrate line fashion, and are unremarkable though, evaluation is somewhat limited by patient motion. There is a eccentric intimal hematoma measuring up to 6 mm in thickness at the origin of the right internal carotid artery, narrowing the lumen to 2.4 mm, the lumen above the stenosis is 4.8 mm. The hematoma begins at the origin, and extends approximately 25 mm cephalad. The origin of the left internal carotid artery is widely patent and unremarkable. The distal cervical internal carotid arteries are normal.  The origins of the bilateral vertebral arteries appear widely patent. The right vertebral artery is dominant. The vertebral arteries appear normal in course and caliber, unremarkable.  No pseudoaneurysm, large vessel occlusion, contrast extravasation or suspicious luminal irregularity within the posterior nor anterior circulation.  Review of the MIP images confirms the above findings.  IMPRESSION: CTA neck: Eccentric right internal carotid artery origin intimal hematoma resulting in 50% narrowing of the right ICA origin.  CTA head: No large vessel occlusion or hemodynamically significant stenosis.  If continued concern for acute ischemia, consider MRI of the brain.   Electronically Signed   By: Awilda Metro   On: 09/21/2013 23:40   Ct Head (brain) Wo Contrast  09/21/2013   CLINICAL DATA:  Could stroke, left-sided weakness  EXAM: CT HEAD WITHOUT CONTRAST  TECHNIQUE: Contiguous axial images were obtained from the base of the skull through the vertex without intravenous contrast.  COMPARISON:  None.  FINDINGS: No acute intracranial hemorrhage. No focal mass lesion. No CT evidence of acute infarction. No midline shift or mass effect. No hydrocephalus. Basilar cisterns are patent.  Paranasal sinuses and mastoid air cells are clear.  IMPRESSION: 1.  No acute  intracranial  findings. 2. No CT evidence of acute infarction. 3. No evidence of intracranial hemorrhage. Findings conveyed toDr. Gillermo Murdoch 09/21/2013  at22:37.   Electronically Signed   By: Genevive Bi M.D.   On: 09/21/2013 22:38   Ct Angio Neck W/cm &/or Wo/cm  09/21/2013   CLINICAL DATA:  Code stroke. "Fuzzy" left arm weakness and grip. Pain in right temple. Dizziness.  EXAM: CT ANGIOGRAPHY HEAD AND NECK  TECHNIQUE: Multidetector CT imaging of the head and neck was performed using the standard protocol during bolus administration of intravenous contrast. Multiplanar CT image reconstructions and MIPs were obtained to evaluate the vascular anatomy. Carotid stenosis measurements (when applicable) are obtained utilizing NASCET criteria, using the distal internal carotid diameter as the denominator. Please note, due to initial poor bolus timing, repeat angiography was performed.  CONTRAST:  OMNIPAQUE IOHEXOL 350 MG/ML SOLN  COMPARISON:  CT of the head   September 21, 2013 at 2229 hr  FINDINGS: Initial contrast bolus timing was suboptimal, however on the repeat examination with improved arterial opacification, there is marked motion degradation at the upper neck.  CTA HEAD FINDINGS  Anterior Circulation: The bilateral petrous, cavernous and supra clinoid internal carotid arteries are patent, there is mild narrowing of the anterior genu of the right internal carotid artery. Normal appearance of the anterior and middle cerebral arteries.  Posterior circulation: Right vertebral artery is dominant. Normal appearance of the vertebral arteries, basilar junction and basilar artery, including the branch vessels. Small right posterior communicating artery is present. Normal appearance of posterior cerebral arteries.  No large vessel occlusion, aneurysm, high-grade stenosis, suspicious luminal irregularity nor contrast extravasation.  Review of the MIP images confirms the above findings.  CTA NECK FINDINGS  Two vessel aortic arch  appears normal. The origins of the bilateral common carotid arteries appear widely patent. Bilateral common carotid arteries course demonstrate line fashion, and are unremarkable though, evaluation is somewhat limited by patient motion. There is a eccentric intimal hematoma measuring up to 6 mm in thickness at the origin of the right internal carotid artery, narrowing the lumen to 2.4 mm, the lumen above the stenosis is 4.8 mm. The hematoma begins at the origin, and extends approximately 25 mm cephalad. The origin of the left internal carotid artery is widely patent and unremarkable. The distal cervical internal carotid arteries are normal.  The origins of the bilateral vertebral arteries appear widely patent. The right vertebral artery is dominant. The vertebral arteries appear normal in course and caliber, unremarkable.  No pseudoaneurysm, large vessel occlusion, contrast extravasation or suspicious luminal irregularity within the posterior nor anterior circulation.  Review of the MIP images confirms the above findings.  IMPRESSION: CTA neck: Eccentric right internal carotid artery origin intimal hematoma resulting in 50% narrowing of the right ICA origin.  CTA head: No large vessel occlusion or hemodynamically significant stenosis.  If continued concern for acute ischemia, consider MRI of the brain.   Electronically Signed   By: Awilda Metro   On: 09/21/2013 23:40    Other results:  EKG: Ventricular Rate: 70  PR Interval: 158  QRS Duration: 82  QT Interval: 389  QTC Calculation: 420  R Axis: 76  Text Interpretation: Sinus arrhythmia Ventricular premature complex Biatrial enlargement ST elev, probable normal early repol pattern   Assessment: 53 year old male with past medical history of CAD s/p stent, MI in 2013, PE 03/2013 on xarelto, tobacco abuse, and medical noncompliance for at least 2 months who presents with acute onset left  sided weakness and numbness most likely due to embolic right  sided stroke.    Plan:   Probable Acute Ischemic Right MCA Stroke - Pt presented with acute onset left sided paraesthesias and paresis (face/arm> leg), right posterior/temporal headache, dizziness, vision change, and found to have left sided neglect most likely due to right MCA ischemic stroke in setting of noncompliance with xarelto and brilinta with preceding chest pain.. CT head w/o contrast did not reveal acute abnormality. CTA neck revealed eccentric right internal carotid artery origin intimal hematoma resulting in 50% narrowing of the right ICA origin. Pt with no prior history of stroke with risk factors of HL, smoking history, and FH of stroke, and noncompliance with xarelto for PE. Pt was not given tPA as symptoms began at 4:30PM. NIH stroke score was 10. BP was normal on admission.  -Appreciate neurology consult -Administer IV heparin per pharmacy in setting of possible embolic stroke (consider restarting home xarelto) -Obtain MR MRA Brain w/o cont (pt reports claustrophobia, Ativan 0.5 mg PRN)   -Obtain 2D Echo w/contrast -Obtain Doppler US carotids  -Obtain TSH, lipid panel, HbA1c, UDS (+ THC), cycle troponins  -Monitor on telemetry  -Neurochecks Q2hr -Obtain PT/OT/ST consults  -Continue home atorvastatin 80 mg daily   Coronary Artery Disease - s/p DES to prox LAD and MI in 2013. Pt noncompliant with medications for at least past 2 months. Pt with acute onset chest pain before onset of stroke symptoms. Initial troponin in ED was negative and 12-lead EKG without ischemic changes.  -Cycle troponins x 3  -Continue home brilinta 90 mg BID -Hold home SL nitroglycerin 0.4 mg to allow permissive HTN  -Hold home lisinopril 5 mg daily to allow permissive HTN -Hold home metoprolol tartrate 12.5 mg BID to allow permissive HTN  Leukocytosis - Pt with WBC of 14.2 on admission with neutrophilia. Etiology most likely reactive due to stroke as no infectious source identified.  -Continue to  monitor CBC  Depression - currently with stable mood. Pt noncompliant with medications for at least 2 months.  -Hold home quetiapine 300 mg daily, consider restarting   -Hold home paroxetine 10 mg daily, consider restarting   Chronic Kidney Disease Stage III - Pt with Cr of 1.50, above baseline of 1.2-1.3. Pt appears eu -Continue to monitor  -Avoid nephrotoxins  Substance Abuse -  Pt with history of marijuana and cocaine abuse. Reported quitting about 1 yr ago, however UDS on admission positive for marijuana.  -Encourage cessation  Asthma? - Currently without respiratory distress. Pt on dulera PRN at home. -Continue home dulera 2 puffs PRN daily   Diet: Heart healthy DVT Ppx: IV heparin Code: Full   Dispo: Disposition is deferred at this time, awaiting improvement of current medical problems. Anticipated discharge in approximately 2-3 day(s).   The patient does not have a current PCP (Pcp Not In System) and [facility].  The patient does not have transportation limitations that hinder transportation to clinic appointments.  Signed: Otis BraceMarjan Landy Mace, MD 09/22/2013, 2:03 AM .imt

## 2013-09-22 NOTE — Evaluation (Addendum)
Occupational Therapy Evaluation Patient Details Name: Timothy Peck MRN: [id number] DOB: [birth year] Today's Date: 09/22/2013 Time: 4098-1191 OT Time Calculation (min): 14 min  OT Assessment / Plan / Recommendation History of present illness 53 y.o. admitted with left sided weakness and numbness. MRI results pending.   Clinical Impression   Pt presents with below problem list. Evaluation limited due to pain and lethargy.     OT Assessment  Patient needs continued OT Services    Follow Up Recommendations  Supervision/Assistance - 24 hour (d/c recommendations TBD)    Barriers to Discharge      Equipment Recommendations  3 in 1 bedside comode    Recommendations for Other Services    Frequency  Min 2X/week    Precautions / Restrictions Precautions Precautions: Fall Restrictions Weight Bearing Restrictions: No   Pertinent Vitals/Pain 8/10 headache. Nurse notified.     ADL  Grooming: Wash/dry face;Wash/dry hands;Min guard Where Assessed - Grooming: Supported standing Lower Body Dressing: Minimal assistance Where Assessed - Lower Body Dressing: Unsupported sit to stand Toilet Transfer: Min guard;Minimal assistance Toilet Transfer Method: Sit to Barista: Regular height toilet;Grab bars Toileting - Clothing Manipulation and Hygiene: Min guard Where Assessed - Engineer, mining and Hygiene: Standing Tub/Shower Transfer Method: Not assessed Equipment Used: Gait belt Transfers/Ambulation Related to ADLs: Min A for ambulation and Min guard/Min A for transfers. ADL Comments: Told pt to avoid hot surfaces and sharp objects with LUE due to decreased sensation. Recommended sitting to get dressed. Recommended to stop smoking.    OT Diagnosis: Acute pain;Generalized weakness;Altered mental status  OT Problem List: Decreased cognition;Impaired balance (sitting and/or standing);Decreased activity tolerance;Decreased strength;Decreased  coordination;Impaired vision/perception;Decreased safety awareness;Decreased knowledge of use of DME or AE;Decreased knowledge of precautions;Pain;Impaired sensation OT Treatment Interventions: Self-care/ADL training;Therapeutic exercise;Neuromuscular education;DME and/or AE instruction;Therapeutic activities;Cognitive remediation/compensation;Visual/perceptual remediation/compensation;Patient/family education;Balance training   OT Goals(Current goals can be found in the care plan section) Acute Rehab OT Goals Patient Stated Goal: go home OT Goal Formulation: With patient Time For Goal Achievement: 09/29/13 Potential to Achieve Goals: Good ADL Goals Pt Will Perform Upper Body Dressing: with modified independence;sitting Pt Will Perform Lower Body Dressing: with modified independence;sit to/from stand Pt Will Transfer to Toilet: with modified independence;ambulating;regular height toilet Pt Will Perform Toileting - Clothing Manipulation and hygiene: with modified independence;sit to/from stand Pt Will Perform Tub/Shower Transfer: Tub transfer;with supervision;ambulating (tub equipment tbd) Additional ADL Goal #1: Pt will be independent with HEP for LUE to increase coordination.  Visit Information  Last OT Received On: 09/22/13 Assistance Needed: +1 History of Present Illness: 53 y.o. admitted with left sided weakness and numbness. MRI results pending.       Prior Functioning     Home Living Family/patient expects to be discharged to:: Private residence Living Arrangements: Spouse/significant other Available Help at Discharge: Family;Available 24 hours/day Type of Home: Apartment Home Access: Level entry Home Layout: One level Home Equipment: Cane - single point Additional Comments: may want to verify home setup Prior Function Level of Independence: Independent with assistive device(s) Communication Communication: No difficulties Dominant Hand: Right          Vision/Perception Vision - History Patient Visual Report: Blurring of vision Vision - Assessment Vision Assessment: Vision not tested Additional Comments: pt lethargic and had headace   Cognition  Cognition Arousal/Alertness: Lethargic;Suspect due to medications Behavior During Therapy: Impulsive Overall Cognitive Status: Impaired/Different from baseline Area of Impairment: Safety/judgement;Problem solving;Awareness Safety/Judgement: Decreased awareness of safety Awareness: Emergent Problem Solving: Slow processing;Requires verbal  cues General Comments: cues to turn water off after grooming at sink    Extremity/Trunk Assessment Upper Extremity Assessment Upper Extremity Assessment: Difficult to assess due to impaired cognition;LUE deficits/detail (pt lethargic) LUE Sensation: decreased light touch LUE Coordination: decreased fine motor Lower Extremity Assessment Lower Extremity Assessment: Defer to PT evaluation     Mobility Bed Mobility Overal bed mobility: Needs Assistance Bed Mobility: Sit to Supine;Supine to Sit Supine to sit: Supervision Sit to supine: Supervision Transfers Overall transfer level: Needs assistance Equipment used: None Transfers: Sit to/from Stand Sit to Stand: Min guard;Min assist General transfer comment: cues for technique.     Exercise     Balance     End of Session OT - End of Session Equipment Utilized During Treatment: Gait belt Activity Tolerance: Patient limited by lethargy;Patient limited by pain Patient left: in bed;with call bell/phone within reach;with bed alarm set Nurse Communication: Other (comment);Mobility status (headache)  GO     Earlie RavelingStraub, Saniah Schroeter L OTR/L [phone] 09/22/2013, 4:32 PM

## 2013-09-22 NOTE — Progress Notes (Signed)
PT Cancellation Note  Patient Details Name: Doristine BosworthByron G Mendizabal MRN: [id number] DOB: 06/14/1961   Cancelled Treatment:    Reason Eval/Treat Not Completed: Patient at procedure or test/unavailable, MRI   Fabio AsaWerner, Seidy Labreck J 09/22/2013, 10:03 AM Charlotte Crumbevon Anani Gu, PT DPT  [phone]

## 2013-09-22 NOTE — Progress Notes (Signed)
ANTICOAGULATION CONSULT NOTE - Follow Up Consult  Pharmacy Consult for Heparin Indication: stroke, proximal right ICA thrombus  Allergies  Allergen Reactions  . Ibuprofen Other (See Comments)    Doctor told him he could not take    Patient Measurements: Height: 6' 4.5" (194.3 cm) Weight: 191 lb (86.637 kg) IBW/kg (Calculated) : 87.95 Heparin Dosing Weight: 87 kg  Vital Signs: Temp: 97.9 F (36.6 C) (02/19 0800) Temp src: Oral (02/19 0800) BP: 136/73 mmHg (02/19 0800) Pulse Rate: 74 (02/19 0800)  Labs:  Recent Labs  09/21/13 2222 09/21/13 2228 09/22/13 0540 09/22/13 0730 09/22/13 0744  HGB 17.0 17.7* 15.9  --   --   HCT 46.9 52.0 44.7  --   --   PLT 299  --  269  --   --   APTT 24  --   --   --   --   LABPROT 13.3  --   --   --   --   INR 1.03  --   --   --   --   HEPARINUNFRC <0.10*  --   --  0.21*  --   CREATININE 1.50* 1.50* 1.34  --   --   TROPONINI <0.30  --  <0.30  --  <0.30    Estimated Creatinine Clearance: 80.8 ml/min (by C-G formula based on Cr of 1.34).  Assessment:   Initial heparin level is 0.21 on 1000 units/hr. Below low goal of 0.3-0.5, lower end of therapeutic range.  Hx PE in August 2014.  Previously on Xarelto, but not taking prior to admission.   Off the floor for MRI.  Goal of Therapy:  Heparin level 0.3-0.5 units/ml Monitor platelets by anticoagulation protocol: Yes   Plan:   Increase heparin drip to 1150 units/hr upon return from MRI.  Will check heparin level ~ 6 hours after rate increase.  Daily heparin level and CBC while on heparin.  Dennie FettersEgan, Efrem Pitstick Donovan, RPh Pager: [phone] 09/22/2013,10:37 AM

## 2013-09-23 ENCOUNTER — Inpatient Hospital Stay (HOSPITAL_COMMUNITY): Payer: Medicaid Other

## 2013-09-23 DIAGNOSIS — I6340 Cerebral infarction due to embolism of unspecified cerebral artery: Principal | ICD-10-CM

## 2013-09-23 DIAGNOSIS — N182 Chronic kidney disease, stage 2 (mild): Secondary | ICD-10-CM

## 2013-09-23 LAB — CBC
HEMATOCRIT: 41.3 % (ref 39.0–52.0)
HEMOGLOBIN: 14.6 g/dL (ref 13.0–17.0)
MCH: 32.1 pg (ref 26.0–34.0)
MCHC: 35.4 g/dL (ref 30.0–36.0)
MCV: 90.8 fL (ref 78.0–100.0)
Platelets: 269 10*3/uL (ref 150–400)
RBC: 4.55 MIL/uL (ref 4.22–5.81)
RDW: 13.6 % (ref 11.5–15.5)
WBC: 11.3 10*3/uL — ABNORMAL HIGH (ref 4.0–10.5)

## 2013-09-23 LAB — HEPARIN LEVEL (UNFRACTIONATED)
HEPARIN UNFRACTIONATED: 0.25 [IU]/mL — AB (ref 0.30–0.70)
Heparin Unfractionated: 0.61 IU/mL (ref 0.30–0.70)

## 2013-09-23 MED ORDER — HEPARIN (PORCINE) IN NACL 100-0.45 UNIT/ML-% IJ SOLN
1050.0000 [IU]/h | INTRAMUSCULAR | Status: DC
  Administered 2013-09-23: 1050 [IU]/h via INTRAVENOUS
  Filled 2013-09-23: qty 250

## 2013-09-23 MED ORDER — ACETAMINOPHEN 325 MG PO TABS
650.0000 mg | ORAL_TABLET | ORAL | Status: DC | PRN
  Administered 2013-09-23 – 2013-09-24 (×8): 650 mg via ORAL
  Filled 2013-09-23 (×7): qty 2

## 2013-09-23 MED ORDER — WARFARIN - PHARMACIST DOSING INPATIENT: Freq: Every day | Status: DC

## 2013-09-23 MED ORDER — FENTANYL CITRATE 0.05 MG/ML IJ SOLN
25.0000 ug | INTRAMUSCULAR | Status: DC | PRN
  Administered 2013-09-23 (×2): 50 ug via INTRAVENOUS
  Administered 2013-09-23: 25 ug via INTRAVENOUS
  Administered 2013-09-23: 50 ug via INTRAVENOUS
  Administered 2013-09-23: 25 ug via INTRAVENOUS

## 2013-09-23 MED ORDER — HEPARIN (PORCINE) IN NACL 100-0.45 UNIT/ML-% IJ SOLN
1100.0000 [IU]/h | INTRAMUSCULAR | Status: DC
Start: 2013-09-23 — End: 2013-09-27
  Administered 2013-09-24: 1150 [IU]/h via INTRAVENOUS
  Administered 2013-09-25 – 2013-09-26 (×3): 1100 [IU]/h via INTRAVENOUS
  Filled 2013-09-23 (×7): qty 250

## 2013-09-23 MED ORDER — ACETAMINOPHEN 650 MG RE SUPP: 650.0000 mg | RECTAL | Status: DC | PRN

## 2013-09-23 MED ORDER — FENTANYL CITRATE 0.05 MG/ML IJ SOLN
INTRAMUSCULAR | Status: AC
  Administered 2013-09-23: 50 ug via INTRAVENOUS
  Filled 2013-09-23: qty 4

## 2013-09-23 MED ORDER — MIDAZOLAM HCL 2 MG/2ML IJ SOLN
INTRAMUSCULAR | Status: AC
  Administered 2013-09-23: 1 mg
  Filled 2013-09-23: qty 10

## 2013-09-23 MED ORDER — WHITE PETROLATUM GEL
Status: AC
  Administered 2013-09-24: 02:00:00
  Filled 2013-09-23: qty 5

## 2013-09-23 MED ORDER — MIDAZOLAM HCL 2 MG/2ML IJ SOLN
1.0000 mg | INTRAMUSCULAR | Status: DC | PRN
  Administered 2013-09-23 (×2): 2 mg via INTRAVENOUS
  Administered 2013-09-23 (×2): 1 mg via INTRAVENOUS
  Administered 2013-09-23 (×2): 2 mg via INTRAVENOUS
  Filled 2013-09-23: qty 10

## 2013-09-23 MED ORDER — WARFARIN SODIUM 7.5 MG PO TABS
7.5000 mg | ORAL_TABLET | Freq: Once | ORAL | Status: AC
  Administered 2013-09-23: 7.5 mg via ORAL
  Filled 2013-09-23: qty 1

## 2013-09-23 NOTE — Progress Notes (Signed)
PT Cancellation Note  Patient Details Name: Doristine BosworthByron G Carias MRN: [id number] DOB: 10/30/1960   Cancelled Treatment:    Reason Eval/Treat Not Completed: Patient at procedure or test/unavailable, patient off the floor for sedated MRI. Will continue to follow.   Fabio AsaWerner, Rondrick Barreira J 09/23/2013, 10:13 AM Charlotte Crumbevon Zalyn Amend, PT DPT  [phone]

## 2013-09-23 NOTE — Evaluation (Signed)
Physical Therapy Evaluation Patient Details Name: Timothy Peck MRN: [id number] DOB: [birth year] Today's Date: Peck Time: 4098-1191 PT Time Calculation (min): 29 min  PT Assessment / Plan / Recommendation History of Present Illness  53 y.o. admitted with left sided weakness and numbness. MRI results pending.  Clinical Impression  Patient demonstrates deficits in mobility and cognition as indicated below. Patient will benefit from skilled PT to address deficits and maximize function and safety. Will need continued intensive rehabilitation CIR upon acute discharge.  Will see as indicated and progress as tolerated. Spoke at length with patient and wife regarding recommendations.  Given patients deficits, patient at significant risk for falls and injury if patient were to dc home without appropriate level of rehabilitation.     PT Assessment  Patient needs continued PT services    Follow Up Recommendations  CIR;Supervision/Assistance - 24 hour    Does the patient have the potential to tolerate intense rehabilitation      Barriers to Discharge   Patient extremely impulsive and demonstrates poor attention leaving patient at signifcant risk for injury    Equipment Recommendations  Other (comment) (TBD)    Recommendations for Other Services     Frequency Min 4X/week    Precautions / Restrictions Precautions Precautions: Fall Restrictions Weight Bearing Restrictions: No   Pertinent Vitals/Pain 9/10 headache      Mobility  Bed Mobility Overal bed mobility: Needs Assistance Bed Mobility: Sit to Supine;Supine to Sit Supine to sit: Min guard Sit to supine: Min guard Transfers Overall transfer level: Needs assistance Equipment used: 1 person hand held assist Transfers: Sit to/from Stand Sit to Stand: Min assist General transfer comment: VCs for hand placement, patient with poor perception and unable to place hands safely to stand Ambulation/Gait Ambulation/Gait assistance:  Min assist;Mod assist Ambulation Distance (Feet): 180 Feet Assistive device: 1 person hand held assist Gait Pattern/deviations: Ataxic;Scissoring;Step-through pattern;Decreased stride length;Narrow base of support;Staggering left;Staggering right Gait velocity: decreased, manual pacing required to normalize gait Gait velocity interpretation: <1.8 ft/sec, indicative of risk for recurrent falls General Gait Details: patient fluctuating with gait in small enclosed areas presents with significantly short festinating steps barely clearing each soot in length, but when manually paced in the hall and open areas patient was able to increas to step through gait but had increased scissoring.  Very unsteady when attempting to add any activity to tasks. Modified Rankin (Stroke Patients Only) Pre-Morbid Rankin Score: No symptoms Modified Rankin: Moderately severe disability    Exercises     PT Diagnosis: Difficulty walking;Abnormality of gait;Altered mental status  PT Problem List: Decreased activity tolerance;Decreased balance;Decreased mobility;Decreased coordination;Decreased cognition;Decreased safety awareness;Impaired sensation;Pain PT Treatment Interventions: DME instruction;Gait training;Stair training;Functional mobility training;Therapeutic activities;Therapeutic exercise;Balance training;Neuromuscular re-education;Cognitive remediation;Patient/family education     PT Goals(Current goals can be found in the care plan section) Acute Rehab PT Goals Patient Stated Goal: go home PT Goal Formulation: With patient/family Time For Goal Achievement: 10/07/13 Potential to Achieve Goals: Good  Visit Information  Last PT Received On: 09/23/13 Assistance Needed: +1 Reason Eval/Treat Not Completed: Patient at procedure or test/unavailable History of Present Illness: 53 y.o. admitted with left sided weakness and numbness. MRI results pending.       Prior Functioning  Home Living Family/patient  expects to be discharged to:: Private residence Living Arrangements: Spouse/significant other Available Help at Discharge: Family;Available 24 hours/day Type of Home: Apartment Home Access: Level entry Home Layout: One level Home Equipment: Cane - single point Prior Function Level of Independence: Independent with  assistive device(s) Communication Communication: No difficulties Dominant Hand: Right    Cognition  Cognition Arousal/Alertness: Lethargic;Suspect due to medications Behavior During Therapy: Impulsive Overall Cognitive Status: Impaired/Different from baseline Area of Impairment: Attention;Safety/judgement;Awareness;Problem solving Current Attention Level: Selective Safety/Judgement: Decreased awareness of safety Awareness: Emergent Problem Solving: Slow processing;Requires verbal cues General Comments: Patient unable to problem solve how to turn on the sink, patient with decreased/inattention to the left side requires cues to attend.  Poor gaze stabilization during activity.    Extremity/Trunk Assessment Upper Extremity Assessment LUE Sensation: decreased light touch LUE Coordination: decreased fine motor Lower Extremity Assessment Lower Extremity Assessment: LLE deficits/detail LLE Deficits / Details: asymetrical weakness compared to RLE, poor coordination and scissoring during gait. LLE Sensation: decreased proprioception LLE Coordination: decreased fine motor;decreased gross motor   Balance Balance Overall balance assessment: Needs assistance High level balance activites: Direction changes;Turns;Head turns High Level Balance Comments: Mod to max assist to prevent complete LOB, extremely unsteady with head turns during ambulation, inability to coordinate gait  End of Session PT - End of Session Equipment Utilized During Treatment: Gait belt Activity Tolerance: Patient tolerated treatment well;Patient limited by pain Patient left: in chair;with call bell/phone  within reach;with chair alarm set;with family/visitor present Nurse Communication: Mobility status  GP     Fabio Peck, Timothy Peck, Timothy Peck Charlotte Crumbevon Mikele Sifuentes, PT DPT  [phone]

## 2013-09-23 NOTE — Progress Notes (Signed)
Rehab Admissions Coordinator Note:  Patient was screened by Clois DupesBoyette, Axten Pascucci Godwin for appropriateness for an Inpatient Acute Rehab Consult.  At this time, we are recommending Inpatient Rehab consult.  Clois DupesBoyette, Josslynn Mentzer Godwin 09/23/2013, 2:54 PM  I can be reached at [phone]

## 2013-09-23 NOTE — Progress Notes (Addendum)
ANTICOAGULATION CONSULT NOTE - Follow Up Consult  Pharmacy Consult for Heparin and Coumadin Indication: stroke, proximal right ICA thrombus  Allergies  Allergen Reactions  . Ibuprofen Other (See Comments)    Doctor told him he could not take    Patient Measurements: Height: 6' 4.5" (194.3 cm) Weight: 191 lb (86.637 kg) IBW/kg (Calculated) : 87.95 Heparin Dosing Weight: 87 kg  Vital Signs: Temp: 98.3 F (36.8 C) (02/20 0609) Temp src: Oral (02/20 0609) BP: 108/63 mmHg (02/20 1056) Pulse Rate: 58 (02/20 1056)  Labs:  Recent Labs  09/21/13 2222 09/21/13 2228 09/22/13 0540 09/22/13 0730 09/22/13 0744 09/22/13 1830 09/23/13 0530  HGB 17.0 17.7* 15.9  --   --   --  14.6  HCT 46.9 52.0 44.7  --   --   --  41.3  PLT 299  --  269  --   --   --  269  APTT 24  --   --   --   --   --   --   LABPROT 13.3  --   --   --   --   --   --   INR 1.03  --   --   --   --   --   --   HEPARINUNFRC <0.10*  --   --  0.21*  --  0.44 0.61  CREATININE 1.50* 1.50* 1.34  --   --   --   --   TROPONINI <0.30  --  <0.30  --  <0.30  --   --     Estimated Creatinine Clearance: 80.8 ml/min (by C-G formula based on Cr of 1.34).  Assessment:   Heparin level was 0.61 this morning on 1150 units/hr. Therapeutic, but aiming for lower end of target range (0.3-0.5).  Heparin stopped this morning for MRI, to r/o hemorrhagic conversion; ruled out, heparin now resumed. May transition to Coumadin when taking PO.  Hx PE in August 2014.  Previously on Xarelto, but not taking prior to admission.  Goal of Therapy:  INR 2-3 Heparin level 0.3-0.5 units/ml, lower end of therapeutic range Monitor platelets by anticoagulation protocol: Yes   Plan:   Heparin drip resumed at decreased rate of 1050 units/hr.  Will check heparin level ~ 6 hours after rate change.  Daily heparin level and CBC while on heparin.  Will follow up for Coumadin when able.  Dennie Fettersgan, Daylen Hack Donovan, [place] Pager: [phone] 09/23/2013,12:41  PM  Addenedum:  To begin Coumadin today.  Will begin with Coumadin 7.5 mg tonight.  Daily PT/INR.  Target INR 2-3.  Hilarie Fredrickson. Brinden Kincheloe, RPh 1:37 PM

## 2013-09-23 NOTE — Progress Notes (Signed)
Patient ID: Timothy Peck, male   DOB: November 01, 1960, 53 y.o.   MRN: [id number] Pt scheduled for MRI brain with IV conscious sedation today for further evaluation of evolving right MCA territory infarct. Timothy Peck is a 53 y.o. male with a history of coronary artery disease, myocardial infarction, pulmonary embolism and depression, presenting 09/21/2013 with new onset weakness involving his left side as well as numbness. Symptoms were associated with feeling of disequilibrium at the onset. No previous history of stroke nor TIA. Patient had not been compliant with anticoagulation and had not taken Xarelto for several months. Patient also admited that he had not been taking Brilinta, as well. CT scan of his head showed no acute intracranial abnormality. CTA of the head and neck showed a hematoma at the origin of the right ICA causing a 50% stenosis. Study was otherwise unremarkable. No intracranial vascular abnormality was seen. NIH stroke score was 10. He was last known well at 1600 09/21/2013. Patient was not administerd TPA secondary to delay in arrival. He was admitted for further evaluation and treatment. CT head today reveals evolving patchy multifocal posterior right MCA infarct with no assoc mass effect or hemorrhage.Additional PMH as below. Exam: pt awake,answers questions appropriately with only mistake of saying it was March when asked which month we were in. Mild rt lower facial asymmetry; tongue midline; c/o rt frontal HA now and mild nausea; sl weak on left side but able to move all four ext; sens fxn intact; chest- CTA bilat; heart- RRR; abd- soft,+BS,NT; ext- no edema.   Filed Vitals:   09/23/13 0609 09/23/13 0844 09/23/13 0921 09/23/13 0940  BP: 103/76  115/73 120/81  Pulse: 84   56  Temp: 98.3 F (36.8 C)     TempSrc: Oral     Resp: 18   12  Height:      Weight:      SpO2: 99% 98% 97% 97%   Past Medical History  Diagnosis Date  . Depression   . Degenerative disc disease, cervical      Dx years ago  . CAD (coronary artery disease) 02/2012    NSTEMI - PCI to prox LAD  . Presence of drug coated stent in LAD coronary artery 02/2012    Promus Element 4.0 mm x 28 mm  . MI (myocardial infarction) 02/02/2012    Normal 2D Echo - EF-60  . Hematoma, s/p cath -rt wrist 12/22/2012  . Chest pain, stable cardiac cath 12/2012 12/22/2012  . NSTEMI (non-ST elevated myocardial infarction), 07/18/13, stable cath with patent stent. 01/31/2012  . Chronic anticoagulation, since 03/2013 for PE 07/19/2013   Past Surgical History  Procedure Laterality Date  . Tumor removal      left foot third toe  . Anterior cervical decomp/discectomy fusion  11/28/2011    Procedure: ANTERIOR CERVICAL DECOMPRESSION/DISCECTOMY FUSION 1 LEVEL;  Surgeon: Temple Pacini, MD;  Location: [facility];  Service: Neurosurgery;  Laterality: N/A;  Anterior Cervical Six-Seven Decompression and Fusion  . Cardiac catheterization  01/31/2012    Promeus Element DES 4 x 28, balloon-Emerge monorail  . Cardiac catheterization  12/17/12    patent stent, stable CAD   Ct Angio Head W/cm &/or Wo Cm  09/21/2013   CLINICAL DATA:  Code stroke. "Fuzzy" left arm weakness and grip. Pain in right temple. Dizziness.  EXAM: CT ANGIOGRAPHY HEAD AND NECK  TECHNIQUE: Multidetector CT imaging of the head and neck was performed using the standard protocol during bolus administration of intravenous  contrast. Multiplanar CT image reconstructions and MIPs were obtained to evaluate the vascular anatomy. Carotid stenosis measurements (when applicable) are obtained utilizing NASCET criteria, using the distal internal carotid diameter as the denominator. Please note, due to initial poor bolus timing, repeat angiography was performed.  CONTRAST:  100mL OMNIPAQUE IOHEXOL 350 MG/ML SOLN  COMPARISON:  CT of the head   September 21, 2013 at 2229 hr  FINDINGS: Initial contrast bolus timing was suboptimal, however on the repeat examination with improved arterial opacification,  there is marked motion degradation at the upper neck.  CTA HEAD FINDINGS  Anterior Circulation: The bilateral petrous, cavernous and supra clinoid internal carotid arteries are patent, there is mild narrowing of the anterior genu of the right internal carotid artery. Normal appearance of the anterior and middle cerebral arteries.  Posterior circulation: Right vertebral artery is dominant. Normal appearance of the vertebral arteries, basilar junction and basilar artery, including the branch vessels. Small right posterior communicating artery is present. Normal appearance of posterior cerebral arteries.  No large vessel occlusion, aneurysm, high-grade stenosis, suspicious luminal irregularity nor contrast extravasation.  Review of the MIP images confirms the above findings.  CTA NECK FINDINGS  Two vessel aortic arch appears normal. The origins of the bilateral common carotid arteries appear widely patent. Bilateral common carotid arteries course demonstrate line fashion, and are unremarkable though, evaluation is somewhat limited by patient motion. There is a eccentric intimal hematoma measuring up to 6 mm in thickness at the origin of the right internal carotid artery, narrowing the lumen to 2.4 mm, the lumen above the stenosis is 4.8 mm. The hematoma begins at the origin, and extends approximately 25 mm cephalad. The origin of the left internal carotid artery is widely patent and unremarkable. The distal cervical internal carotid arteries are normal.  The origins of the bilateral vertebral arteries appear widely patent. The right vertebral artery is dominant. The vertebral arteries appear normal in course and caliber, unremarkable.  No pseudoaneurysm, large vessel occlusion, contrast extravasation or suspicious luminal irregularity within the posterior nor anterior circulation.  Review of the MIP images confirms the above findings.  IMPRESSION: CTA neck: Eccentric right internal carotid artery origin intimal  hematoma resulting in 50% narrowing of the right ICA origin.  CTA head: No large vessel occlusion or hemodynamically significant stenosis.  If continued concern for acute ischemia, consider MRI of the brain.   Electronically Signed   By: Awilda Metroourtnay  Bloomer   On: 09/21/2013 23:40   Ct Head Wo Contrast  09/23/2013   CLINICAL DATA:  53 year old male with recent stroke. On heparin. Severe right side headache. Initial encounter.  EXAM: CT HEAD WITHOUT CONTRAST  TECHNIQUE: Contiguous axial images were obtained from the base of the skull through the vertex without intravenous contrast.  COMPARISON:  Head and neck CTA 09/21/2013 and earlier.  FINDINGS: Stable paranasal sinuses and mastoids. No acute osseous abnormality identified. No acute orbit or scalp soft tissue findings.  Multifocal patchy posterior right MCA hypodensity in a cytotoxic edema pattern. There is some lateral right occipital lobe involvement as well. These changes are new since 09/21/2013. No associated acute intracranial hemorrhage. No significant mass effect. Basilar cisterns remain patent.  No ventriculomegaly. Stable and normal gray-Righi matter differentiation in the left hemisphere and posterior fossa.  IMPRESSION: 1. Evolving patchy multifocal posterior right MCA territory infarct. 2. No associated mass effect or hemorrhage. 3. Stable brain elsewhere.   Electronically Signed   By: Augusto GambleLee  Hall M.D.   On: 09/23/2013 09:11  Ct Head (brain) Wo Contrast  09/21/2013   CLINICAL DATA:  Could stroke, left-sided weakness  EXAM: CT HEAD WITHOUT CONTRAST  TECHNIQUE: Contiguous axial images were obtained from the base of the skull through the vertex without intravenous contrast.  COMPARISON:  None.  FINDINGS: No acute intracranial hemorrhage. No focal mass lesion. No CT evidence of acute infarction. No midline shift or mass effect. No hydrocephalus. Basilar cisterns are patent.  Paranasal sinuses and mastoid air cells are clear.  IMPRESSION: 1.  No acute  intracranial findings. 2. No CT evidence of acute infarction. 3. No evidence of intracranial hemorrhage. Findings conveyed toDr. Gillermo Murdoch 09/21/2013  at22:37.   Electronically Signed   By: Genevive Bi M.D.   On: 09/21/2013 22:38   Ct Angio Neck W/cm &/or Wo/cm  09/21/2013   CLINICAL DATA:  Code stroke. "Fuzzy" left arm weakness and grip. Pain in right temple. Dizziness.  EXAM: CT ANGIOGRAPHY HEAD AND NECK  TECHNIQUE: Multidetector CT imaging of the head and neck was performed using the standard protocol during bolus administration of intravenous contrast. Multiplanar CT image reconstructions and MIPs were obtained to evaluate the vascular anatomy. Carotid stenosis measurements (when applicable) are obtained utilizing NASCET criteria, using the distal internal carotid diameter as the denominator. Please note, due to initial poor bolus timing, repeat angiography was performed.  CONTRAST:  OMNIPAQUE IOHEXOL 350 MG/ML SOLN  COMPARISON:  CT of the head   September 21, 2013 at 2229 hr  FINDINGS: Initial contrast bolus timing was suboptimal, however on the repeat examination with improved arterial opacification, there is marked motion degradation at the upper neck.  CTA HEAD FINDINGS  Anterior Circulation: The bilateral petrous, cavernous and supra clinoid internal carotid arteries are patent, there is mild narrowing of the anterior genu of the right internal carotid artery. Normal appearance of the anterior and middle cerebral arteries.  Posterior circulation: Right vertebral artery is dominant. Normal appearance of the vertebral arteries, basilar junction and basilar artery, including the branch vessels. Small right posterior communicating artery is present. Normal appearance of posterior cerebral arteries.  No large vessel occlusion, aneurysm, high-grade stenosis, suspicious luminal irregularity nor contrast extravasation.  Review of the MIP images confirms the above findings.  CTA NECK FINDINGS  Two vessel  aortic arch appears normal. The origins of the bilateral common carotid arteries appear widely patent. Bilateral common carotid arteries course demonstrate line fashion, and are unremarkable though, evaluation is somewhat limited by patient motion. There is a eccentric intimal hematoma measuring up to 6 mm in thickness at the origin of the right internal carotid artery, narrowing the lumen to 2.4 mm, the lumen above the stenosis is 4.8 mm. The hematoma begins at the origin, and extends approximately 25 mm cephalad. The origin of the left internal carotid artery is widely patent and unremarkable. The distal cervical internal carotid arteries are normal.  The origins of the bilateral vertebral arteries appear widely patent. The right vertebral artery is dominant. The vertebral arteries appear normal in course and caliber, unremarkable.  No pseudoaneurysm, large vessel occlusion, contrast extravasation or suspicious luminal irregularity within the posterior nor anterior circulation.  Review of the MIP images confirms the above findings.  IMPRESSION: CTA neck: Eccentric right internal carotid artery origin intimal hematoma resulting in 50% narrowing of the right ICA origin.  CTA head: No large vessel occlusion or hemodynamically significant stenosis.  If continued concern for acute ischemia, consider MRI of the brain.   Electronically Signed   By: Awilda Metro  On: 09/21/2013 23:40  Results for orders placed during the hospital encounter of 09/21/13  [facility]      Result Value Ref Range   Prothrombin Time 13.3  11.6 - 15.2 seconds   INR 1.03  0.00 - 1.49  APTT      Result Value Ref Range   aPTT 24  24 - 37 seconds  CBC      Result Value Ref Range   WBC 14.2 (*) 4.0 - 10.5 K/uL   RBC 5.24  4.22 - 5.81 MIL/uL   Hemoglobin 17.0  13.0 - 17.0 g/dL   HCT 40.9  81.1 - 91.4 %   MCV 89.5  78.0 - 100.0 fL   MCH 32.4  26.0 - 34.0 pg   MCHC 36.2 (*) 30.0 - 36.0 g/dL   RDW 78.2  95.6 - 21.3 %   Platelets  299  150 - 400 K/uL  DIFFERENTIAL      Result Value Ref Range   Neutrophils Relative % 81 (*) 43 - 77 %   Neutro Abs 11.5 (*) 1.7 - 7.7 K/uL   Lymphocytes Relative 14  12 - 46 %   Lymphs Abs 2.0  0.7 - 4.0 K/uL   Monocytes Relative 4  3 - 12 %   Monocytes Absolute 0.6  0.1 - 1.0 K/uL   Eosinophils Relative 1  0 - 5 %   Eosinophils Absolute 0.1  0.0 - 0.7 K/uL   Basophils Relative 0  0 - 1 %   Basophils Absolute 0.0  0.0 - 0.1 K/uL  COMPREHENSIVE METABOLIC PANEL      Result Value Ref Range   Sodium 138  137 - 147 mEq/L   Potassium 4.1  3.7 - 5.3 mEq/L   Chloride 101  96 - 112 mEq/L   CO2 27  19 - 32 mEq/L   Glucose, Bld 160 (*) 70 - 99 mg/dL   BUN 17  6 - 23 mg/dL   Creatinine, Ser 0.86 (*) 0.50 - 1.35 mg/dL   Calcium 8.5  8.4 - 57.8 mg/dL   Total Protein 6.1  6.0 - 8.3 g/dL   Albumin 3.5  3.5 - 5.2 g/dL   AST 25  0 - 37 U/L   ALT 30  0 - 53 U/L   Alkaline Phosphatase 46  39 - 117 U/L   Total Bilirubin 0.3  0.3 - 1.2 mg/dL   GFR calc non Af Amer 53 (*) >90 mL/min   GFR calc Af Amer 61 (*) >90 mL/min  TROPONIN I      Result Value Ref Range   Troponin I <0.30  <0.30 ng/mL  GLUCOSE, CAPILLARY      Result Value Ref Range   Glucose-Capillary 155 (*) 70 - 99 mg/dL  HEPARIN LEVEL (UNFRACTIONATED)      Result Value Ref Range   Heparin Unfractionated <0.10 (*) 0.30 - 0.70 IU/mL  HEPARIN LEVEL (UNFRACTIONATED)      Result Value Ref Range   Heparin Unfractionated 0.21 (*) 0.30 - 0.70 IU/mL  URINE RAPID DRUG SCREEN (HOSP PERFORMED)      Result Value Ref Range   Opiates NONE DETECTED  NONE DETECTED   Cocaine NONE DETECTED  NONE DETECTED   Benzodiazepines NONE DETECTED  NONE DETECTED   Amphetamines NONE DETECTED  NONE DETECTED   Tetrahydrocannabinol POSITIVE (*) NONE DETECTED   Barbiturates NONE DETECTED  NONE DETECTED  CBC      Result Value Ref Range   WBC 14.6 (*) 4.0 -  10.5 K/uL   RBC 4.97  4.22 - 5.81 MIL/uL   Hemoglobin 15.9  13.0 - 17.0 g/dL   HCT 40.9  81.1 - 91.4 %    MCV 89.9  78.0 - 100.0 fL   MCH 32.0  26.0 - 34.0 pg   MCHC 35.6  30.0 - 36.0 g/dL   RDW 78.2  95.6 - 21.3 %   Platelets 269  150 - 400 K/uL  BASIC METABOLIC PANEL      Result Value Ref Range   Sodium 139  137 - 147 mEq/L   Potassium 3.9  3.7 - 5.3 mEq/L   Chloride 105  96 - 112 mEq/L   CO2 22  19 - 32 mEq/L   Glucose, Bld 108 (*) 70 - 99 mg/dL   BUN 16  6 - 23 mg/dL   Creatinine, Ser 0.86  0.50 - 1.35 mg/dL   Calcium 8.4  8.4 - 57.8 mg/dL   GFR calc non Af Amer 60 (*) >90 mL/min   GFR calc Af Amer 70 (*) >90 mL/min  TSH      Result Value Ref Range   TSH 0.645  0.350 - 4.500 uIU/mL  HEMOGLOBIN A1C      Result Value Ref Range   Hemoglobin A1C 5.6  <5.7 %   Mean Plasma Glucose 114  <117 mg/dL  LIPID PANEL      Result Value Ref Range   Cholesterol 154  0 - 200 mg/dL   Triglycerides 93  <469 mg/dL   HDL 43  >62 mg/dL   Total CHOL/HDL Ratio 3.6     VLDL 19  0 - 40 mg/dL   LDL Cholesterol 92  0 - 99 mg/dL  TROPONIN I      Result Value Ref Range   Troponin I <0.30  <0.30 ng/mL  TROPONIN I      Result Value Ref Range   Troponin I <0.30  <0.30 ng/mL  HEPARIN LEVEL (UNFRACTIONATED)      Result Value Ref Range   Heparin Unfractionated 0.44  0.30 - 0.70 IU/mL  CBC      Result Value Ref Range   WBC 11.3 (*) 4.0 - 10.5 K/uL   RBC 4.55  4.22 - 5.81 MIL/uL   Hemoglobin 14.6  13.0 - 17.0 g/dL   HCT 95.2  84.1 - 32.4 %   MCV 90.8  78.0 - 100.0 fL   MCH 32.1  26.0 - 34.0 pg   MCHC 35.4  30.0 - 36.0 g/dL   RDW 40.1  02.7 - 25.3 %   Platelets 269  150 - 400 K/uL  HEPARIN LEVEL (UNFRACTIONATED)      Result Value Ref Range   Heparin Unfractionated 0.61  0.30 - 0.70 IU/mL  POCT I-STAT, CHEM 8      Result Value Ref Range   Sodium 142  137 - 147 mEq/L   Potassium 3.9  3.7 - 5.3 mEq/L   Chloride 99  96 - 112 mEq/L   BUN 17  6 - 23 mg/dL   Creatinine, Ser 6.64 (*) 0.50 - 1.35 mg/dL   Glucose, Bld 403 (*) 70 - 99 mg/dL   Calcium, Ion 4.74  2.59 - 1.23 mmol/L   TCO2 26  0 - 100  mmol/L   Hemoglobin 17.7 (*) 13.0 - 17.0 g/dL   HCT 56.3  87.5 - 64.3 %  POCT I-STAT TROPONIN I      Result Value Ref Range   Troponin i, poc 0.02  0.00 - 0.08 ng/mL   Comment 3            A/P: Pt with hx dizziness, left sided weakness, rt frontal HA and right ICA stenosis/ right MCA territory infarct. Plan is for MRI brain with IV conscious sedation today. Details of above d/w pt with his apparent understanding and consent.

## 2013-09-23 NOTE — Progress Notes (Signed)
Stroke Team Progress Note  HISTORY Timothy Peck is a 53 y.o. male with a history of coronary artery disease, myocardial infarction, pulmonary embolism and depression, presenting 09/21/2013 with new onset weakness involving his left side as well as numbness. Symptoms were associated with feeling of disequilibrium at the onset. No previous history of stroke nor TIA. Patient had not been compliant with anticoagulation and had not taken Xarelto for several months. Patient also admited that he had not been taking Brilinta, as well. CT scan of his head showed no acute intracranial abnormality. CTA of the head and neck showed a hematoma at the origin of the right ICA causing a 50% stenosis. Study was otherwise unremarkable. No intracranial vascular abnormality was seen. NIH stroke score was 10. He was last known well at 1600 09/21/2013. Patient was not administerd TPA secondary to delay in arrival. He was admitted for further evaluation and treatment.  SUBJECTIVE The patient's wife is present. He is very lethargic having received fentanyl and Versed for an MRI today.  OBJECTIVE Most recent Vital Signs: Filed Vitals:   09/22/13 1735 09/22/13 2140 09/23/13 0200 09/23/13 0609  BP: 109/54 130/91 126/69 103/76  Pulse: 58 62 70 84  Temp: 97.5 F (36.4 C) 98.5 F (36.9 C) 98.4 F (36.9 C) 98.3 F (36.8 C)  TempSrc: Oral Oral Oral Oral  Resp: 18 18 18 18   Height:      Weight:      SpO2: 100% 97% 97% 99%   CBG (last 3)   Recent Labs  09/21/13 2235  GLUCAP 155*    IV Fluid Intake:   . heparin 1,150 Units/hr (09/22/13 2143)    MEDICATIONS  . atorvastatin  80 mg Oral q1800  . influenza vac split quadrivalent PF  0.5 mL Intramuscular Tomorrow-1000  . mometasone-formoterol  2 puff Inhalation BID  . multivitamin with minerals  1 tablet Oral Daily  . sodium chloride  3 mL Intravenous Q12H  . Ticagrelor  90 mg Oral BID   PRN:  acetaminophen, acetaminophen, feeding supplement (ENSURE COMPLETE),  fentaNYL, midazolam, ondansetron (ZOFRAN) IV, ondansetron, senna-docusate  Diet:  Cardiac thin liquids Activity:  Up with assistance DVT Prophylaxis:  IV heparin  CLINICALLY SIGNIFICANT STUDIES Basic Metabolic Panel:   Recent Labs Lab 09/21/13 2222 09/21/13 2228 09/22/13 0540  NA 138 142 139  K 4.1 3.9 3.9  CL 101 99 105  CO2 27  --  22  GLUCOSE 160* 160* 108*  BUN 17 17 16   CREATININE 1.50* 1.50* 1.34  CALCIUM 8.5  --  8.4   Liver Function Tests:   Recent Labs Lab 09/21/13 2222  AST 25  ALT 30  ALKPHOS 46  BILITOT 0.3  PROT 6.1  ALBUMIN 3.5   CBC:  Recent Labs Lab 09/21/13 2222  09/22/13 0540 09/23/13 0530  WBC 14.2*  --  14.6* 11.3*  NEUTROABS 11.5*  --   --   --   HGB 17.0  < > 15.9 14.6  HCT 46.9  < > 44.7 41.3  MCV 89.5  --  89.9 90.8  PLT 299  --  269 269  < > = values in this interval not displayed. Coagulation:   Recent Labs Lab 09/21/13 2222  LABPROT 13.3  INR 1.03   Cardiac Enzymes:   Recent Labs Lab 09/21/13 2222 09/22/13 0540 09/22/13 0744  TROPONINI <0.30 <0.30 <0.30   Urinalysis: No results found for this basename: COLORURINE, APPERANCEUR, LABSPEC, PHURINE, GLUCOSEU, HGBUR, BILIRUBINUR, KETONESUR, PROTEINUR, UROBILINOGEN, NITRITE, LEUKOCYTESUR,  in the last 168 hours Lipid Panel    Component Value Date/Time   CHOL 154 09/22/2013 0540   TRIG 93 09/22/2013 0540   HDL 43 09/22/2013 0540   CHOLHDL 3.6 09/22/2013 0540   VLDL 19 09/22/2013 0540   LDLCALC 92 09/22/2013 0540   HgbA1C  Lab Results  Component Value Date   HGBA1C 5.6 09/22/2013    Urine Drug Screen:     Component Value Date/Time   LABOPIA NONE DETECTED 09/22/2013 0035   COCAINSCRNUR NONE DETECTED 09/22/2013 0035   LABBENZ NONE DETECTED 09/22/2013 0035   AMPHETMU NONE DETECTED 09/22/2013 0035   THCU POSITIVE* 09/22/2013 0035   LABBARB NONE DETECTED 09/22/2013 0035    Alcohol Level: No results found for this basename: ETH,  in the last 168 hours   CT of the brain   09/21/2013    1.  No acute intracranial findings. 2. No CT evidence of acute infarction. 3. No evidence of intracranial hemorrhage.   CT angio head 09/21/2013 No large vessel occlusion or hemodynamically significant stenosis.    CT angio neck 09/21/2013  Eccentric right internal carotid artery origin intimal hematoma resulting in 50% narrowing of the right ICA origin.    MRI of the brain   09/22/2013 Multiple areas of patchy, acute right MCA territory infarct with small amount of petechial hemorrhage.   MRA of the brain  See CT angio  2D Echocardiogram ejection fraction 60%. No cardiac source of embolism was identified.   Carotid Doppler  Preliminary findings: Right:There appears to be moderate to severe homogeneous plaque vs. thrombus noted in the proximal ICA. Bilateral: 1-39% ICA stenosis. Vertebral artery flow is antegrade.  CXR  07/18/2014 No active cardiopulmonary disease.  EKG  Sinus arrhythmia. For complete results please see formal report.   Therapy Recommendations pending  Physical Exam   Young healthy looking African American male who is sleepy but arousable.Awake alert. Afebrile. Head is nontraumatic. Neck is supple without bruit. Hearing is normal. Cardiac exam no murmur or gallop. Lungs are clear to auscultation. Distal pulses are well felt. Neurological Exam : Drowsy but can arouse and follows simple commands. Speech is slight hesitant but fluent. Able to name and repeat. Follows commands. Slight left gaze preference but able to look to the right past midline. Blinks to threat bilaterally. Minimum right lower facial asymmetry. Tongue midline. Moves both upper and lower extremity   well against gravity. No focal weakness except right grip strength and fine finger movements and diminished. Plantars downgoing.  ASSESSMENT Timothy Peck is a 53 y.o. male presenting with dizziness, left-sided weakness and numbness. 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 Stroke imaging pending. On Brilinta prior to admission. Now on heparin for secondary stroke prevention. Patient with resultant left hemiparesis and dizziness, left hemisensory deficit. Work up underway.   History of pulmonary embolus in August 2014, anticoagulated with Xarelto, patient noncompliant with medication for several months.  LDL 92 - now on Lipitor 80 mg daily  THC positive, this is a risk factor for stroke. History of cocaine use, urine drug screen negative this admission.  Coronary artery disease-MI, angioplasty to the proximal LAD, stent placement 2013  Cigarette smoker   Hospital day # 2  TREATMENT/PLAN  Continue heparin for now for secondary stroke prevention. Start Coumadin per Dr.Sethi Dc heparin when INR optimal . Rehab consult MRI results - Multiple areas of patchy, acute right MCA territory infarct with small amount of petechial hemorrhage.  No indication for MRA as CT angiography completed  Follow up rest of stroke workup  Therapy evaluations pending  Delton See PA-C [facility] Pager [phone] 09/23/2013, 8:23 AM  I have personally obtained a history, examined the patient, evaluated imaging results, and formulated the assessment and plan of care. I agree with the above.  Delia Heady, MD  Delia Heady, MD

## 2013-09-23 NOTE — Progress Notes (Signed)
OT Cancellation Note  Patient Details Name: Doristine BosworthByron G Mehrer MRN: [id number] DOB: 04/30/1961   Cancelled Treatment:     Reason eval/treatment not completed: Pt currently off floor for testing (MRI), will check back as able.  Roselie AwkwardBarnhill, Amy Beth Dixon 09/23/2013, 10:22 AM

## 2013-09-23 NOTE — Progress Notes (Signed)
ANTICOAGULATION CONSULT NOTE - Follow Up Consult  Pharmacy Consult for Heparin and Coumadin Indication: stroke, proximal right ICA thrombus  Allergies  Allergen Reactions  . Ibuprofen Other (See Comments)    Doctor told him he could not take    Patient Measurements: Height: 6' 4.5" (194.3 cm) Weight: 191 lb (86.637 kg) IBW/kg (Calculated) : 87.95 Heparin Dosing Weight: 87 kg  Vital Signs: Temp: 97.9 F (36.6 C) (02/20 1740) Temp src: Oral (02/20 1740) BP: 111/66 mmHg (02/20 1740) Pulse Rate: 58 (02/20 1740)  Labs:  Recent Labs  09/21/13 2222 09/21/13 2228 09/22/13 0540  09/22/13 0744 09/22/13 1830 09/23/13 0530 09/23/13 1900  HGB 17.0 17.7* 15.9  --   --   --  14.6  --   HCT 46.9 52.0 44.7  --   --   --  41.3  --   PLT 299  --  269  --   --   --  269  --   APTT 24  --   --   --   --   --   --   --   LABPROT 13.3  --   --   --   --   --   --   --   INR 1.03  --   --   --   --   --   --   --   HEPARINUNFRC <0.10*  --   --   < >  --  0.44 0.61 0.25*  CREATININE 1.50* 1.50* 1.34  --   --   --   --   --   TROPONINI <0.30  --  <0.30  --  <0.30  --   --   --   < > = values in this interval not displayed.  Estimated Creatinine Clearance: 80.8 ml/min (by C-G formula based on Cr of 1.34).  Assessment: 53 yo male with embolic CVA on heparin and heparin level is below goal (HL= 0.25) after decrease to 1050 units/hr.   Goal of Therapy:  INR 2-3 Heparin level 0.3-0.5 units/ml, lower end of therapeutic range Monitor platelets by anticoagulation protocol: Yes   Plan:   -Increase heparin to 1150 units/hr -Heparin level and CBC in am  Timothy Peck, Pharm D 09/23/2013 8:46 PM

## 2013-09-23 NOTE — Progress Notes (Signed)
Subjective: Patient complained to nursing about HA overnight on right side, reportedly unchanged from previous.  However this morning he reports his headache is worse, now a 11/10 right sided. Objective: Vital signs in last 24 hours: Filed Vitals:   09/22/13 1735 09/22/13 2140 09/23/13 0200 09/23/13 0609  BP: 109/54 130/91 126/69 103/76  Pulse: 58 62 70 84  Temp: 97.5 F (36.4 C) 98.5 F (36.9 C) 98.4 F (36.9 C) 98.3 F (36.8 C)  TempSrc: Oral Oral Oral Oral  Resp: 18 18 18 18   Height:      Weight:      SpO2: 100% 97% 97% 99%   Weight change:   Intake/Output Summary (Last 24 hours) at 09/23/13 0823 Last data filed at 09/23/13 0610  Gross per 24 hour  Intake    400 ml  Output    800 ml  Net   -400 ml   General: resting in bed, moderate distress, holding right side of head with right hand HEENT: PERRL, EOMI, no scleral icterus Cardiac: RRR, no rubs, murmurs or gallops Pulm: clear to auscultation bilaterally, moving normal volumes of air Abd: soft, nontender, nondistended, BS present Ext: warm and well perfused, no pedal edema Neuro: alert and oriented X3, EOMI, possible left temporal visual field deficit, otherwise no cranial nerve deficit, poor finger to nose L>R but poor cooperation due to headache, poor rapid alternating movements bilaterally, 4/5 LUE strength, 5/5 RUE and lower extremity strength.  Lab Results: Basic Metabolic Panel:  Recent Labs Lab 09/21/13 2222 09/21/13 2228 09/22/13 0540  NA 138 142 139  K 4.1 3.9 3.9  CL 101 99 105  CO2 27  --  22  GLUCOSE 160* 160* 108*  BUN 17 17 16   CREATININE 1.50* 1.50* 1.34  CALCIUM 8.5  --  8.4   Liver Function Tests:  Recent Labs Lab 09/21/13 2222  AST 25  ALT 30  ALKPHOS 46  BILITOT 0.3  PROT 6.1  ALBUMIN 3.5   No results found for this basename: LIPASE, AMYLASE,  in the last 168 hours No results found for this basename: AMMONIA,  in the last 168 hours CBC:  Recent Labs Lab 09/21/13 2222   09/22/13 0540 09/23/13 0530  WBC 14.2*  --  14.6* 11.3*  NEUTROABS 11.5*  --   --   --   HGB 17.0  < > 15.9 14.6  HCT 46.9  < > 44.7 41.3  MCV 89.5  --  89.9 90.8  PLT 299  --  269 269  < > = values in this interval not displayed. Cardiac Enzymes:  Recent Labs Lab 09/21/13 2222 09/22/13 0540 09/22/13 0744  TROPONINI <0.30 <0.30 <0.30   BNP: No results found for this basename: PROBNP,  in the last 168 hours D-Dimer: No results found for this basename: DDIMER,  in the last 168 hours CBG:  Recent Labs Lab 09/21/13 2235  GLUCAP 155*   Hemoglobin A1C:  Recent Labs Lab 09/22/13 0540  HGBA1C 5.6   Fasting Lipid Panel:  Recent Labs Lab 09/22/13 0540  CHOL 154  HDL 43  LDLCALC 92  TRIG 93  CHOLHDL 3.6   Thyroid Function Tests:  Recent Labs Lab 09/22/13 0540  TSH 0.645   Coagulation:  Recent Labs Lab 09/21/13 2222  LABPROT 13.3  INR 1.03   Anemia Panel: No results found for this basename: VITAMINB12, FOLATE, FERRITIN, TIBC, IRON, RETICCTPCT,  in the last 168 hours Urine Drug Screen: Drugs of Abuse  Component Value Date/Time   LABOPIA NONE DETECTED 09/22/2013 0035   COCAINSCRNUR NONE DETECTED 09/22/2013 0035   LABBENZ NONE DETECTED 09/22/2013 0035   AMPHETMU NONE DETECTED 09/22/2013 0035   THCU POSITIVE* 09/22/2013 0035   LABBARB NONE DETECTED 09/22/2013 0035     Micro Results: No results found for this or any previous visit (from the past 240 hour(s)). Studies/Results: Ct Angio Head W/cm &/or Wo Cm  09/21/2013   CLINICAL DATA:  Code stroke. "Fuzzy" left arm weakness and grip. Pain in right temple. Dizziness.  EXAM: CT ANGIOGRAPHY HEAD AND NECK  TECHNIQUE: Multidetector CT imaging of the head and neck was performed using the standard protocol during bolus administration of intravenous contrast. Multiplanar CT image reconstructions and MIPs were obtained to evaluate the vascular anatomy. Carotid stenosis measurements (when applicable) are obtained  utilizing NASCET criteria, using the distal internal carotid diameter as the denominator. Please note, due to initial poor bolus timing, repeat angiography was performed.  CONTRAST:  OMNIPAQUE IOHEXOL 350 MG/ML SOLN  COMPARISON:  CT of the head   September 21, 2013 at 2229 hr  FINDINGS: Initial contrast bolus timing was suboptimal, however on the repeat examination with improved arterial opacification, there is marked motion degradation at the upper neck.  CTA HEAD FINDINGS  Anterior Circulation: The bilateral petrous, cavernous and supra clinoid internal carotid arteries are patent, there is mild narrowing of the anterior genu of the right internal carotid artery. Normal appearance of the anterior and middle cerebral arteries.  Posterior circulation: Right vertebral artery is dominant. Normal appearance of the vertebral arteries, basilar junction and basilar artery, including the branch vessels. Small right posterior communicating artery is present. Normal appearance of posterior cerebral arteries.  No large vessel occlusion, aneurysm, high-grade stenosis, suspicious luminal irregularity nor contrast extravasation.  Review of the MIP images confirms the above findings.  CTA NECK FINDINGS  Two vessel aortic arch appears normal. The origins of the bilateral common carotid arteries appear widely patent. Bilateral common carotid arteries course demonstrate line fashion, and are unremarkable though, evaluation is somewhat limited by patient motion. There is a eccentric intimal hematoma measuring up to 6 mm in thickness at the origin of the right internal carotid artery, narrowing the lumen to 2.4 mm, the lumen above the stenosis is 4.8 mm. The hematoma begins at the origin, and extends approximately 25 mm cephalad. The origin of the left internal carotid artery is widely patent and unremarkable. The distal cervical internal carotid arteries are normal.  The origins of the bilateral vertebral arteries appear widely  patent. The right vertebral artery is dominant. The vertebral arteries appear normal in course and caliber, unremarkable.  No pseudoaneurysm, large vessel occlusion, contrast extravasation or suspicious luminal irregularity within the posterior nor anterior circulation.  Review of the MIP images confirms the above findings.  IMPRESSION: CTA neck: Eccentric right internal carotid artery origin intimal hematoma resulting in 50% narrowing of the right ICA origin.  CTA head: No large vessel occlusion or hemodynamically significant stenosis.  If continued concern for acute ischemia, consider MRI of the brain.   Electronically Signed   By: Awilda Metro   On: 09/21/2013 23:40   Ct Head (brain) Wo Contrast  09/21/2013   CLINICAL DATA:  Could stroke, left-sided weakness  EXAM: CT HEAD WITHOUT CONTRAST  TECHNIQUE: Contiguous axial images were obtained from the base of the skull through the vertex without intravenous contrast.  COMPARISON:  None.  FINDINGS: No acute intracranial hemorrhage. No focal mass lesion.  No CT evidence of acute infarction. No midline shift or mass effect. No hydrocephalus. Basilar cisterns are patent.  Paranasal sinuses and mastoid air cells are clear.  IMPRESSION: 1.  No acute intracranial findings. 2. No CT evidence of acute infarction. 3. No evidence of intracranial hemorrhage. Findings conveyed toDr. Gillermo Murdoch 09/21/2013  at22:37.   Electronically Signed   By: Genevive Bi M.D.   On: 09/21/2013 22:38   Ct Angio Neck W/cm &/or Wo/cm  09/21/2013   CLINICAL DATA:  Code stroke. "Fuzzy" left arm weakness and grip. Pain in right temple. Dizziness.  EXAM: CT ANGIOGRAPHY HEAD AND NECK  TECHNIQUE: Multidetector CT imaging of the head and neck was performed using the standard protocol during bolus administration of intravenous contrast. Multiplanar CT image reconstructions and MIPs were obtained to evaluate the vascular anatomy. Carotid stenosis measurements (when applicable) are obtained  utilizing NASCET criteria, using the distal internal carotid diameter as the denominator. Please note, due to initial poor bolus timing, repeat angiography was performed.  CONTRAST:  OMNIPAQUE IOHEXOL 350 MG/ML SOLN  COMPARISON:  CT of the head   September 21, 2013 at 2229 hr  FINDINGS: Initial contrast bolus timing was suboptimal, however on the repeat examination with improved arterial opacification, there is marked motion degradation at the upper neck.  CTA HEAD FINDINGS  Anterior Circulation: The bilateral petrous, cavernous and supra clinoid internal carotid arteries are patent, there is mild narrowing of the anterior genu of the right internal carotid artery. Normal appearance of the anterior and middle cerebral arteries.  Posterior circulation: Right vertebral artery is dominant. Normal appearance of the vertebral arteries, basilar junction and basilar artery, including the branch vessels. Small right posterior communicating artery is present. Normal appearance of posterior cerebral arteries.  No large vessel occlusion, aneurysm, high-grade stenosis, suspicious luminal irregularity nor contrast extravasation.  Review of the MIP images confirms the above findings.  CTA NECK FINDINGS  Two vessel aortic arch appears normal. The origins of the bilateral common carotid arteries appear widely patent. Bilateral common carotid arteries course demonstrate line fashion, and are unremarkable though, evaluation is somewhat limited by patient motion. There is a eccentric intimal hematoma measuring up to 6 mm in thickness at the origin of the right internal carotid artery, narrowing the lumen to 2.4 mm, the lumen above the stenosis is 4.8 mm. The hematoma begins at the origin, and extends approximately 25 mm cephalad. The origin of the left internal carotid artery is widely patent and unremarkable. The distal cervical internal carotid arteries are normal.  The origins of the bilateral vertebral arteries appear widely  patent. The right vertebral artery is dominant. The vertebral arteries appear normal in course and caliber, unremarkable.  No pseudoaneurysm, large vessel occlusion, contrast extravasation or suspicious luminal irregularity within the posterior nor anterior circulation.  Review of the MIP images confirms the above findings.  IMPRESSION: CTA neck: Eccentric right internal carotid artery origin intimal hematoma resulting in 50% narrowing of the right ICA origin.  CTA head: No large vessel occlusion or hemodynamically significant stenosis.  If continued concern for acute ischemia, consider MRI of the brain.   Electronically Signed   By: Awilda Metro   On: 09/21/2013 23:40   Medications: I have reviewed the patient's current medications. Scheduled Meds: . atorvastatin  80 mg Oral q1800  . influenza vac split quadrivalent PF  0.5 mL Intramuscular Tomorrow-1000  . mometasone-formoterol  2 puff Inhalation BID  . multivitamin with minerals  1 tablet Oral Daily  .  sodium chloride  3 mL Intravenous Q12H  . Ticagrelor  90 mg Oral BID   Continuous Infusions: . 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 - Obtain limited MRI under sedation to assess size of infarct. - Per Dr. Pearlean Brownie will transition to warfarin to treat thrombus, prevent further embolization. -Continue Brilinta -Continue PT/OT -Continue atorvastatin    CAD S/P percutaneous coronary angioplasty 02/21/12 non-STEMI -Promus DES 4.0 mm 20 mm to prox LAD - Continue brilinta - Hold antihypertensives    Hx Pulmonary embolism  - Previously non compliant with Xarelto, will need treatment for ICA thrombus with  warfarin.  CKD stage II -No acute issues at this time.  Dispo: Disposition is deferred at this time, awaiting improvement of current medical problems.    The patient does not have a current PCP (Pcp Not In System) and does need an [facility] hospital follow-up appointment after discharge.  The patient does not know have transportation limitations that hinder transportation to clinic appointments.  .Services Needed at time of discharge: Y = Yes, Blank = No PT:   OT:   RN:   Equipment:   Other:     LOS: 2 days   Carlynn Purl, DO 09/23/2013, 8:23 AM

## 2013-09-23 NOTE — Progress Notes (Signed)
Pt. Wants to go home and not continue with treatment.  MD was notified and came to talk and inform the patient of the risks of going home now.  Patient decided to stay.  Will continue to monitor patient.

## 2013-09-24 LAB — BASIC METABOLIC PANEL
BUN: 11 mg/dL (ref 6–23)
CHLORIDE: 106 meq/L (ref 96–112)
CO2: 23 mEq/L (ref 19–32)
CREATININE: 1.29 mg/dL (ref 0.50–1.35)
Calcium: 8.5 mg/dL (ref 8.4–10.5)
GFR, EST AFRICAN AMERICAN: 73 mL/min — AB (ref >90)
GFR, EST NON AFRICAN AMERICAN: 63 mL/min — AB (ref >90)
Glucose, Bld: 106 mg/dL — ABNORMAL HIGH (ref 70–99)
Potassium: 4.1 mEq/L (ref 3.7–5.3)
Sodium: 142 mEq/L (ref 137–147)

## 2013-09-24 LAB — CBC
HCT: 39.7 % (ref 39.0–52.0)
Hemoglobin: 13.8 g/dL (ref 13.0–17.0)
MCH: 31.6 pg (ref 26.0–34.0)
MCHC: 34.8 g/dL (ref 30.0–36.0)
MCV: 90.8 fL (ref 78.0–100.0)
Platelets: 238 10*3/uL (ref 150–400)
RBC: 4.37 MIL/uL (ref 4.22–5.81)
RDW: 13.4 % (ref 11.5–15.5)
WBC: 10.6 10*3/uL — ABNORMAL HIGH (ref 4.0–10.5)

## 2013-09-24 LAB — HEPARIN LEVEL (UNFRACTIONATED)
HEPARIN UNFRACTIONATED: 0.41 [IU]/mL (ref 0.30–0.70)
Heparin Unfractionated: 0.48 IU/mL (ref 0.30–0.70)

## 2013-09-24 LAB — PROTIME-INR
INR: 1.04 (ref 0.00–1.49)
PROTHROMBIN TIME: 13.4 s (ref 11.6–15.2)

## 2013-09-24 MED ORDER — WARFARIN SODIUM 7.5 MG PO TABS
7.5000 mg | ORAL_TABLET | Freq: Once | ORAL | Status: AC
  Administered 2013-09-24: 7.5 mg via ORAL
  Filled 2013-09-24: qty 1

## 2013-09-24 MED ORDER — COUMADIN BOOK
Freq: Once | Status: AC
  Administered 2013-09-24: 13:00:00
  Filled 2013-09-24: qty 1

## 2013-09-24 NOTE — Progress Notes (Signed)
Stroke Team Progress Note  HISTORY Timothy Peck is a 53 y.o. male with a history of coronary artery disease, myocardial infarction, pulmonary embolism and depression, presenting 09/21/2013 with new onset weakness and numbness  involving his left side. Symptoms were associated with feeling of disequilibrium at the onset. No previous history of stroke nor TIA. Patient had not been compliant with anticoagulation and had not taken Xarelto for several months. Patient also admited that he had not been taking Brilinta, as well. CT scan of his head showed no acute intracranial abnormality. CTA of the head and neck showed a hematoma at the origin of the right ICA causing a 50% stenosis. Study was otherwise unremarkable. No intracranial vascular abnormality was seen. NIH stroke score was 10. He was last known well at 1600 09/21/2013. Patient was not administerd TPA secondary to delay in arrival. He was admitted for further evaluation and treatment.  SUBJECTIVE There are no family members present. The patient states he has no new complaints. His left hand still feels a little weak but otherwise she is much improved..  OBJECTIVE Most recent Vital Signs: Filed Vitals:   09/23/13 1740 09/23/13 2120 09/24/13 0200 09/24/13 0541  BP: 111/66 141/89 141/68 98/76  Pulse: 58 61 58 75  Temp: 97.9 F (36.6 C) 99.1 F (37.3 C) 98.1 F (36.7 C) 98.3 F (36.8 C)  TempSrc: Oral Oral Oral Oral  Resp: 18 18 18 18   Height:      Weight:      SpO2: 100% 99% 99% 99%   CBG (last 3)   Recent Labs  09/21/13 2235  GLUCAP 155*    IV Fluid Intake:   . heparin 1,150 Units/hr (09/23/13 2218)    MEDICATIONS  . atorvastatin  80 mg Oral q1800  . mometasone-formoterol  2 puff Inhalation BID  . multivitamin with minerals  1 tablet Oral Daily  . sodium chloride  3 mL Intravenous Q12H  . Ticagrelor  90 mg Oral BID  . Warfarin - Pharmacist Dosing Inpatient   Does not apply q1800   PRN:  acetaminophen, acetaminophen,  feeding supplement (ENSURE COMPLETE), ondansetron (ZOFRAN) IV, ondansetron, senna-docusate  Diet:  General thin liquids Activity:  Up with assistance DVT Prophylaxis:  IV heparin / warfarin  CLINICALLY SIGNIFICANT STUDIES Basic Metabolic Panel:   Recent Labs Lab 09/22/13 0540 09/24/13 0415  NA 139 142  K 3.9 4.1  CL 105 106  CO2 22 23  GLUCOSE 108* 106*  BUN 16 11  CREATININE 1.34 1.29  CALCIUM 8.4 8.5   Liver Function Tests:   Recent Labs Lab 09/21/13 2222  AST 25  ALT 30  ALKPHOS 46  BILITOT 0.3  PROT 6.1  ALBUMIN 3.5   CBC:  Recent Labs Lab 09/21/13 2222  09/23/13 0530 09/24/13 0415  WBC 14.2*  < > 11.3* 10.6*  NEUTROABS 11.5*  --   --   --   HGB 17.0  < > 14.6 13.8  HCT 46.9  < > 41.3 39.7  MCV 89.5  < > 90.8 90.8  PLT 299  < > 269 238  < > = values in this interval not displayed. Coagulation:   Recent Labs Lab 09/21/13 2222 09/24/13 0415  LABPROT 13.3 13.4  INR 1.03 1.04   Cardiac Enzymes:   Recent Labs Lab 09/21/13 2222 09/22/13 0540 09/22/13 0744  TROPONINI <0.30 <0.30 <0.30   Urinalysis: No results found for this basename: COLORURINE, APPERANCEUR, LABSPEC, PHURINE, GLUCOSEU, HGBUR, BILIRUBINUR, KETONESUR, PROTEINUR, UROBILINOGEN, NITRITE, LEUKOCYTESUR,  in the last 168 hours Lipid Panel    Component Value Date/Time   CHOL 154 09/22/2013 0540   TRIG 93 09/22/2013 0540   HDL 43 09/22/2013 0540   CHOLHDL 3.6 09/22/2013 0540   VLDL 19 09/22/2013 0540   LDLCALC 92 09/22/2013 0540   HgbA1C  Lab Results  Component Value Date   HGBA1C 5.6 09/22/2013    Urine Drug Screen:     Component Value Date/Time   LABOPIA NONE DETECTED 09/22/2013 0035   COCAINSCRNUR NONE DETECTED 09/22/2013 0035   LABBENZ NONE DETECTED 09/22/2013 0035   AMPHETMU NONE DETECTED 09/22/2013 0035   THCU POSITIVE* 09/22/2013 0035   LABBARB NONE DETECTED 09/22/2013 0035    Alcohol Level: No results found for this basename: ETH,  in the last 168 hours   CT of the brain   09/21/2013    1.  No acute intracranial findings. 2. No CT evidence of acute infarction. 3. No evidence of intracranial hemorrhage.   CT of the brain - secondary to severe headache 09/23/2013 1. Evolving patchy multifocal posterior right MCA territory infarct.  2. No associated mass effect or hemorrhage.  3. Stable brain elsewhere.   CT angio head 09/21/2013 No large vessel occlusion or hemodynamically significant stenosis.    CT angio neck 09/21/2013  Eccentric right internal carotid artery origin intimal hematoma resulting in 50% narrowing of the right ICA origin.    MRI of the brain   09/22/2013 Multiple areas of patchy, acute right MCA territory infarct with small amount of petechial hemorrhage.  MRA of the brain  See CT angio  2D Echocardiogram ejection fraction 60%. No cardiac source of embolism was identified.   Carotid Doppler  Preliminary findings: Right:There appears to be moderate to severe homogeneous plaque vs. thrombus noted in the proximal ICA. Bilateral: 1-39% ICA stenosis. Vertebral artery flow is antegrade.  CXR  07/18/2014 No active cardiopulmonary disease.  EKG  Sinus arrhythmia. For complete results please see formal report.   Therapy Recommendations - inpatient rehabilitation recommended ( rehabilitation M.D. consult pending )  Physical Exam   Young healthy looking African American male who is sleepy but arousable.Awake alert. Afebrile. Head is nontraumatic. Neck is supple without bruit. Hearing is normal. Cardiac exam no murmur or gallop. Lungs are clear to auscultation. Distal pulses are well felt. Neurological Exam : Drowsy but can arouse and follows simple commands. Speech is slight hesitant but fluent. Able to name and repeat. Follows commands. Slight left gaze preference but able to look to the right past midline. Blinks to threat bilaterally. Minimum right lower facial asymmetry. Tongue midline. Moves both upper and lower extremity   well against gravity. No  focal weakness except left grip strength and fine finger movements are diminished. Plantars downgoing.  ASSESSMENT [name] is a 53 y.o. male presenting with dizziness, left-sided weakness and numbness. 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 Stroke imaging pending. On Brilinta prior to admission. Now on heparin and warfarin for secondary stroke prevention. Patient with resultant left hemiparesis and dizziness, left hemisensory deficit. Work up complete.   History of pulmonary embolus in August 2014, anticoagulated with Xarelto, patient noncompliant with medication for several months.  LDL 92 - now on Lipitor 80 mg daily  THC positive, this is a risk factor for stroke. History of cocaine use, urine drug screen negative this admission.  Coronary artery disease-MI, angioplasty to the proximal LAD, stent placement 2013  Cigarette smoker  Anticoagulation - on  heparin and warfarin per pharmacy protocol.  Severe headache yesterday - Head CT showed no new findings   Hospital day # 3  TREATMENT/PLAN  Continue heparin for now for secondary stroke prevention. Start Coumadin per Dr.Sethi - Dc heparin when INR optimal .   Rehab consult pending  No indication for MRA as CT angiography completed  Stroke workup is complete.    Delton See PA-C [facility] Pager [phone] 09/24/2013, 8:53 AM  I have personally obtained a history, examined the patient, evaluated imaging results, and formulated the assessment and plan of care. I agree with the above. Lesly Dukes

## 2013-09-24 NOTE — Progress Notes (Addendum)
Late entry: Patient expressed that he had a tooth decay and it had been hurting. He often requests gingerale with graham crackers. Writer reassure him that he needs to discuss this with MD in the am and that he possibly needs to see a dentist. Ice offered for his toothache along with tylenol. Patient refused ice. Tylenol administered. Patient requests to be discharge to take care of the "tooth problem". Writer explained the risks versus benefits r/t his current condition. Patient agreed to stay and will discuss his current "tooth" problem with MD in the AM. Emotional support given. Will continue to monitor.  Sim BoastHavy, RN

## 2013-09-24 NOTE — Progress Notes (Signed)
Subjective: Complaining tooth abscess, which has been present for so many years. He reports that sometimes gives him headaches. He requests for some pain meds. Denies any new neurological symptoms. Objective: Vital signs in last 24 hours: Filed Vitals:   09/24/13 0200 09/24/13 0541 09/24/13 1030 09/24/13 1300  BP: 141/68 98/76 130/71 144/76  Pulse: 58 75 56 60  Temp: 98.1 F (36.7 C) 98.3 F (36.8 C) 97.7 F (36.5 C) 98.1 F (36.7 C)  TempSrc: Oral Oral Oral Oral  Resp: 18 18 18 20   Height:      Weight:      SpO2: 99% 99% 100% 95%   Weight change:   Intake/Output Summary (Last 24 hours) at 09/24/13 1432 Last data filed at 09/24/13 1300  Gross per 24 hour  Intake 2017.38 ml  Output   2100 ml  Net -82.62 ml   General: resting in bed, no acute distress, holding right side of head with right hand HEENT: PERRL, EOMI, no scleral icterus. Dental caries are noted. Cardiac: RRR, no rubs, murmurs or gallops Pulm: clear to auscultation bilaterally, moving normal volumes of air Abd: soft, nontender, nondistended, BS present Ext: warm and well perfused, no pedal edema Neuro: alert and oriented X3. neurologica exam unchanged.  Lab Results: Basic Metabolic Panel:  Recent Labs Lab 09/22/13 0540 09/24/13 0415  NA 139 142  K 3.9 4.1  CL 105 106  CO2 22 23  GLUCOSE 108* 106*  BUN 16 11  CREATININE 1.34 1.29  CALCIUM 8.4 8.5   Liver Function Tests:  Recent Labs Lab 09/21/13 2222  AST 25  ALT 30  ALKPHOS 46  BILITOT 0.3  PROT 6.1  ALBUMIN 3.5   CBC:  Recent Labs Lab 09/21/13 2222  09/23/13 0530 09/24/13 0415  WBC 14.2*  < > 11.3* 10.6*  NEUTROABS 11.5*  --   --   --   HGB 17.0  < > 14.6 13.8  HCT 46.9  < > 41.3 39.7  MCV 89.5  < > 90.8 90.8  PLT 299  < > 269 238  < > = values in this interval not displayed. Cardiac Enzymes:  Recent Labs Lab 09/21/13 2222 09/22/13 0540 09/22/13 0744  TROPONINI <0.30 <0.30 <0.30   CBG:  Recent Labs Lab  09/21/13 2235  GLUCAP 155*   Hemoglobin A1C:  Recent Labs Lab 09/22/13 0540  HGBA1C 5.6   Fasting Lipid Panel:  Recent Labs Lab 09/22/13 0540  CHOL 154  HDL 43  LDLCALC 92  TRIG 93  CHOLHDL 3.6   Thyroid Function Tests:  Recent Labs Lab 09/22/13 0540  TSH 0.645   Coagulation:  Recent Labs Lab 09/21/13 2222 09/24/13 0415  LABPROT 13.3 13.4  INR 1.03 1.04    Urine Drug Screen: Drugs of Abuse     Component Value Date/Time   LABOPIA NONE DETECTED 09/22/2013 0035   COCAINSCRNUR NONE DETECTED 09/22/2013 0035   LABBENZ NONE DETECTED 09/22/2013 0035   AMPHETMU NONE DETECTED 09/22/2013 0035   THCU POSITIVE* 09/22/2013 0035   LABBARB NONE DETECTED 09/22/2013 0035     Studies/Results: Ct Head Wo Contrast  09/23/2013   CLINICAL DATA:  53 year old male with recent stroke. On heparin. Severe right side headache. Initial encounter.  EXAM: CT HEAD WITHOUT CONTRAST  TECHNIQUE: Contiguous axial images were obtained from the base of the skull through the vertex without intravenous contrast.  COMPARISON:  Head and neck CTA 09/21/2013 and earlier.  FINDINGS: Stable paranasal sinuses and mastoids. No acute osseous abnormality  identified. No acute orbit or scalp soft tissue findings.  Multifocal patchy posterior right MCA hypodensity in a cytotoxic edema pattern. There is some lateral right occipital lobe involvement as well. These changes are new since 09/21/2013. No associated acute intracranial hemorrhage. No significant mass effect. Basilar cisterns remain patent.  No ventriculomegaly. Stable and normal gray-Rudnick matter differentiation in the left hemisphere and posterior fossa.  IMPRESSION: 1. Evolving patchy multifocal posterior right MCA territory infarct. 2. No associated mass effect or hemorrhage. 3. Stable brain elsewhere.   Electronically Signed   By: Augusto Gamble M.D.   On: 09/23/2013 09:11   Mr Brain Wo Contrast  09/23/2013   CLINICAL DATA:  Altered mental status and left-sided  weakness.  EXAM: MRI HEAD WITHOUT CONTRAST  TECHNIQUE: Multiplanar, multiecho pulse sequences of the brain and surrounding structures were obtained without intravenous contrast.  COMPARISON:  Head CT 09/23/2013  FINDINGS: Multiple patchy areas of acute infarction are seen throughout the right MCA territory involving the frontal, parietal, occipital, and temporal lobes. Involvement is greatest in the parietal lobe. There is a small amount of associated petechial hemorrhage, most conspicuous in the parietal lobe. There is no midline shift or extra-axial fluid collection. Ventricles and sulci are within normal limits for age. Major intracranial vascular flow voids appear preserved. Orbits are unremarkable. Paranasal sinuses and mastoid air cells are clear.  IMPRESSION: Multiple areas of patchy, acute right MCA territory infarct with small amount of petechial hemorrhage.   Electronically Signed   By: Sebastian Ache   On: 09/23/2013 11:57   Medications: I have reviewed the patient's current medications. Scheduled Meds: . atorvastatin  80 mg Oral q1800  . coumadin book   Does not apply Once  . mometasone-formoterol  2 puff Inhalation BID  . multivitamin with minerals  1 tablet Oral Daily  . sodium chloride  3 mL Intravenous Q12H  . Ticagrelor  90 mg Oral BID  . warfarin  7.5 mg Oral ONCE-1800  . Warfarin - Pharmacist Dosing Inpatient   Does not apply q1800   Continuous Infusions: . 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 Neuro following. On Brilinta prior to admission. Now on heparin and warfarin for secondary stroke prevention. Plan - will transition to warfarin to treat thrombus, prevent further embolization. -Continue Brilinta -PT recommended  CIR placement but patient is not interested -Continue atorvastatin - appreciate neuro recs  # CAD S/P percutaneous coronary angioplasty 02/21/12 non-STEMI -Promus DES 4.0 mm 20 mm to prox LAD - Continue brilinta - Hold antihypertensives    Hx Pulmonary embolism  - Previously non compliant with Xarelto, will need treatment for ICA thrombus with warfarin.  CKD stage II -No acute issues at this time.  Dispo: Disposition is deferred at this time, awaiting improvement of current medical problems.    The patient does not have a current PCP (Pcp Not In System) and does need an [facility] hospital follow-up appointment after discharge.  The patient does not know have transportation limitations that hinder transportation to clinic appointments.  .Services Needed at time of discharge: Y = Yes, Blank = No PT:   OT:   RN:   Equipment:   Other:     LOS: 3 days   Dow Adolph, MD 09/24/2013, 2:32 PM

## 2013-09-24 NOTE — Progress Notes (Addendum)
ANTICOAGULATION CONSULT NOTE - Follow Up Consult  Pharmacy Consult for Heparin and Coumadin Indication: stroke, proximal right ICA thrombus  Allergies  Allergen Reactions  . Ibuprofen Other (See Comments)    Doctor told him he could not take    Patient Measurements: Height: 6' 4.5" (194.3 cm) Weight: 191 lb (86.637 kg) IBW/kg (Calculated) : 87.95 Heparin Dosing Weight: 87 kg  Vital Signs: Temp: 97.7 F (36.5 C) (02/21 1030) Temp src: Oral (02/21 1030) BP: 130/71 mmHg (02/21 1030) Pulse Rate: 56 (02/21 1030)  Labs:  Recent Labs  09/21/13 2222 09/21/13 2228 09/22/13 0540  09/22/13 0744  09/23/13 0530 09/23/13 1900 09/24/13 0415  HGB 17.0 17.7* 15.9  --   --   --  14.6  --  13.8  HCT 46.9 52.0 44.7  --   --   --  41.3  --  39.7  PLT 299  --  269  --   --   --  269  --  238  APTT 24  --   --   --   --   --   --   --   --   LABPROT 13.3  --   --   --   --   --   --   --  13.4  INR 1.03  --   --   --   --   --   --   --  1.04  HEPARINUNFRC <0.10*  --   --   < >  --   < > 0.61 0.25* 0.41  CREATININE 1.50* 1.50* 1.34  --   --   --   --   --  1.29  TROPONINI <0.30  --  <0.30  --  <0.30  --   --   --   --   < > = values in this interval not displayed.  Estimated Creatinine Clearance: 83.9 ml/min (by C-G formula based on Cr of 1.29).  Assessment: 53 yo male with embolic CVA on heparin bridging to warfarin. Heparin level this morning at goal with level of 0.41units/mL. INR this morning 1.04. Hgb and plts stable, no bleeding noted.  Goal of Therapy:  INR 2-3 Heparin level 0.3-0.5 units/ml, lower end of therapeutic range Monitor platelets by anticoagulation protocol: Yes   Plan:   1. Continue heparin 1150units/hr 2. Confirm with level 6 hours from this morning's draw 3. Warfarin 7.5mg  po x1 tonight 4. Daily heparin level, CBC, PT/INR 5. Follow closely for s/s bleeding  Marshae Azam D. Kahlil Cowans, PharmD, BCPS Clinical Pharmacist Pager: [phone] 09/24/2013 11:28  AM    ADDENDUM Confirmatory heparin level drawn ~6 hours after this morning's level remained therapeutic at 0.48 units/mL. No bleeding noted.  Plan: 1. Continue heparin at 1150 units/hr 2. Daily HL and CBC.  Keondre Markson D. Gudelia Eugene, PharmD, BCPS Clinical Pharmacist Pager: [phone] 09/24/2013 1:27 PM

## 2013-09-25 LAB — PROTIME-INR
INR: 1.09 (ref 0.00–1.49)
Prothrombin Time: 13.9 seconds (ref 11.6–15.2)

## 2013-09-25 LAB — CBC
HCT: 38.9 % — ABNORMAL LOW (ref 39.0–52.0)
HEMOGLOBIN: 13.7 g/dL (ref 13.0–17.0)
MCH: 32 pg (ref 26.0–34.0)
MCHC: 35.2 g/dL (ref 30.0–36.0)
MCV: 90.9 fL (ref 78.0–100.0)
Platelets: 244 10*3/uL (ref 150–400)
RBC: 4.28 MIL/uL (ref 4.22–5.81)
RDW: 13.5 % (ref 11.5–15.5)
WBC: 8.4 10*3/uL (ref 4.0–10.5)

## 2013-09-25 LAB — HEPARIN LEVEL (UNFRACTIONATED)
HEPARIN UNFRACTIONATED: 0.58 [IU]/mL (ref 0.30–0.70)
Heparin Unfractionated: 0.35 IU/mL (ref 0.30–0.70)

## 2013-09-25 MED ORDER — PNEUMOCOCCAL VAC POLYVALENT 25 MCG/0.5ML IJ INJ
0.5000 mL | INJECTION | INTRAMUSCULAR | Status: AC
  Administered 2013-09-26: 0.5 mL via INTRAMUSCULAR
  Filled 2013-09-25: qty 0.5

## 2013-09-25 MED ORDER — WARFARIN SODIUM 2.5 MG PO TABS
12.5000 mg | ORAL_TABLET | Freq: Once | ORAL | Status: AC
  Administered 2013-09-25: 12.5 mg via ORAL
  Filled 2013-09-25 (×2): qty 1

## 2013-09-25 NOTE — Progress Notes (Signed)
INTERNAL MEDICINE TEACHING SERVICE DAILY PROGRESS NOTE  Subjective: No acute complaints today.  Reports his strength is coming back and he notices no weakness on the left side.  He reports that he walked around the hallways last night unassisted. Objective: Vital signs in last 24 hours: Filed Vitals:   09/24/13 2046 09/24/13 2200 09/25/13 0122 09/25/13 0443  BP:  134/67 137/83 103/61  Pulse:  61 62 62  Temp:  98 F (36.7 C) 98.5 F (36.9 C) 97.9 F (36.6 C)  TempSrc:  Oral Oral Oral  Resp:  18 18 16   Height:      Weight:      SpO2: 100% 100% 100% 100%   Weight change:   Intake/Output Summary (Last 24 hours) at 09/25/13 0846 Last data filed at 09/25/13 0700  Gross per 24 hour  Intake    720 ml  Output   2300 ml  Net  -1580 ml   General: resting in bed, no acute distress HEENT: PERRL, EOMI, no scleral icterus. Cardiac: RRR, no rubs, murmurs or gallops Pulm: clear to auscultation bilaterally, moving normal volumes of air Abd: soft, nontender, nondistended, BS present Ext: warm and well perfused, no pedal edema Neuro: alert and oriented X3. 5/5 gross motor strength of upper and lower extremities, mildly decreased grip strength on left.  No cranial nerve deficit appreciated.  Lab Results: Basic Metabolic Panel:  Recent Labs Lab 09/22/13 0540 09/24/13 0415  NA 139 142  K 3.9 4.1  CL 105 106  CO2 22 23  GLUCOSE 108* 106*  BUN 16 11  CREATININE 1.34 1.29  CALCIUM 8.4 8.5   Liver Function Tests:  Recent Labs Lab 09/21/13 2222  AST 25  ALT 30  ALKPHOS 46  BILITOT 0.3  PROT 6.1  ALBUMIN 3.5   CBC:  Recent Labs Lab 09/21/13 2222  09/24/13 0415 09/25/13 0450  WBC 14.2*  < > 10.6* 8.4  NEUTROABS 11.5*  --   --   --   HGB 17.0  < > 13.8 13.7  HCT 46.9  < > 39.7 38.9*  MCV 89.5  < > 90.8 90.9  PLT 299  < > 238 244  < > = values in this interval not displayed. Cardiac Enzymes:  Recent Labs Lab 09/21/13 2222 09/22/13 0540 09/22/13 0744  TROPONINI  <0.30 <0.30 <0.30   CBG:  Recent Labs Lab 09/21/13 2235  GLUCAP 155*   Hemoglobin A1C:  Recent Labs Lab 09/22/13 0540  HGBA1C 5.6   Fasting Lipid Panel:  Recent Labs Lab 09/22/13 0540  CHOL 154  HDL 43  LDLCALC 92  TRIG 93  CHOLHDL 3.6   Thyroid Function Tests:  Recent Labs Lab 09/22/13 0540  TSH 0.645   Coagulation:  Recent Labs Lab 09/21/13 2222 09/24/13 0415 09/25/13 0450  LABPROT 13.3 13.4 13.9  INR 1.03 1.04 1.09    Urine Drug Screen: Drugs of Abuse     Component Value Date/Time   LABOPIA NONE DETECTED 09/22/2013 0035   COCAINSCRNUR NONE DETECTED 09/22/2013 0035   LABBENZ NONE DETECTED 09/22/2013 0035   AMPHETMU NONE DETECTED 09/22/2013 0035   THCU POSITIVE* 09/22/2013 0035   LABBARB NONE DETECTED 09/22/2013 0035     Studies/Results: Ct Head Peck Contrast  09/23/2013   CLINICAL DATA:  53 year old male with recent stroke. On heparin. Severe right side headache. Initial encounter.  EXAM: CT HEAD WITHOUT CONTRAST  TECHNIQUE: Contiguous axial images were obtained from the base of the skull through the vertex without intravenous contrast.  COMPARISON:  Head and neck CTA 09/21/2013 and earlier.  FINDINGS: Stable paranasal sinuses and mastoids. No acute osseous abnormality identified. No acute orbit or scalp soft tissue findings.  Multifocal patchy posterior right MCA hypodensity in a cytotoxic edema pattern. There is some lateral right occipital lobe involvement as well. These changes are new since 09/21/2013. No associated acute intracranial hemorrhage. No significant mass effect. Basilar cisterns remain patent.  No ventriculomegaly. Stable and normal gray-Seese matter differentiation in the left hemisphere and posterior fossa.  IMPRESSION: 1. Evolving patchy multifocal posterior right MCA territory infarct. 2. No associated mass effect or hemorrhage. 3. Stable brain elsewhere.   Electronically Signed   By: Augusto GambleLee  Hall M.D.   On: 09/23/2013 09:11   Timothy Peck  Contrast  09/23/2013   CLINICAL DATA:  Altered mental status and left-sided weakness.  EXAM: MRI HEAD WITHOUT CONTRAST  TECHNIQUE: Multiplanar, multiecho pulse sequences of the brain and surrounding structures were obtained without intravenous contrast.  COMPARISON:  Head CT 09/23/2013  FINDINGS: Multiple patchy areas of acute infarction are seen throughout the right MCA territory involving the frontal, parietal, occipital, and temporal lobes. Involvement is greatest in the parietal lobe. There is a small amount of associated petechial hemorrhage, most conspicuous in the parietal lobe. There is no midline shift or extra-axial fluid collection. Ventricles and sulci are within normal limits for age. Major intracranial vascular flow voids appear preserved. Orbits are unremarkable. Paranasal sinuses and mastoid air cells are clear.  IMPRESSION: Multiple areas of patchy, acute right MCA territory infarct with small amount of petechial hemorrhage.   Electronically Signed   By: Sebastian AcheAllen  Grady   On: 09/23/2013 11:57   Medications: I have reviewed the patient's current medications. Scheduled Meds: . atorvastatin  80 mg Oral q1800  . mometasone-formoterol  2 puff Inhalation BID  . multivitamin with minerals  1 tablet Oral Daily  . sodium chloride  3 mL Intravenous Q12H  . Ticagrelor  90 mg Oral BID  . Warfarin - Pharmacist Dosing Inpatient   Does not apply q1800   Continuous Infusions: . 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 Neuro following. On Brilinta prior to admission (non compliant with Xarelto prior to admission). Now on heparin and warfarin for secondary stroke prevention. Plan - will transition to warfarin to treat thrombus, prevent further  embolization, INR remains subtheraputic -Continue Brilinta -Continue atorvastatin - appreciate neuro recs - will eventually need hypercoagulability workup given arterial and venous thrombosis (cannot due in acute setting/on anticoagulant medications)  # CAD S/P percutaneous coronary angioplasty 02/21/12 non-STEMI -Promus DES 4.0 mm 20 mm to prox LAD - Continue brilinta - Hold antihypertensives    Hx Pulmonary embolism  - Previously non compliant with Xarelto, will need treatment for ICA thrombus with warfarin.  CKD stage II -No acute issues at this time.  Dispo: Disposition is deferred at this time, awaiting improvement of current medical problems.  Patient needs to be therapeutic on Warfarin.  The patient does not have a current PCP (Pcp Not In System) and does [facility] after discharge.  The patient does not know have transportation limitations that hinder transportation to clinic appointments.  .Services Needed at time of discharge: Y = Yes, Blank = No PT:   OT:   RN:   Equipment:  Other:     LOS: 4 days   Carlynn Purl, DO 09/25/2013, 8:46 AM

## 2013-09-25 NOTE — Progress Notes (Signed)
ANTICOAGULATION CONSULT NOTE - Follow Up Consult  Pharmacy Consult for Heparin Indication: stroke, proximal right ICA thrombus  Allergies  Allergen Reactions  . Ibuprofen Other (See Comments)    Doctor told him he could not take    Patient Measurements: Height: 6' 4.5" (194.3 cm) Weight: 191 lb (86.637 kg) IBW/kg (Calculated) : 87.95 Heparin Dosing Weight: 87 kg  Vital Signs: Temp: 97.6 F (36.4 C) (02/22 1656) Temp src: Oral (02/22 1656) BP: 140/70 mmHg (02/22 1656) Pulse Rate: 62 (02/22 1656)  Labs:  Recent Labs  09/23/13 0530  09/24/13 0415 09/24/13 1240 09/25/13 0450 09/25/13 1650  HGB 14.6  --  13.8  --  13.7  --   HCT 41.3  --  39.7  --  38.9*  --   PLT 269  --  238  --  244  --   LABPROT  --   --  13.4  --  13.9  --   INR  --   --  1.04  --  1.09  --   HEPARINUNFRC 0.61  < > 0.41 0.48 0.58 0.35  CREATININE  --   --  1.29  --   --   --   < > = values in this interval not displayed.  Estimated Creatinine Clearance: 83.9 ml/min (by C-G formula based on Cr of 1.29).  Assessment: 53 yo male with embolic CVA on heparin bridging to warfarin. Heparin levels yesterday were therapeutic x2 on 1150units/hr, however this morning heparin level increased to 0.58 units/mL. Rate reduced, and level now therapeutic at 0.35.  No bleeding or complications noted per chart notes.  Goal of Therapy:  INR 2-3 Heparin level 0.3-0.5 units/ml Monitor platelets by anticoagulation protocol: Yes   Plan:   1. Continue IV heparin at 1100 units/hr 2. Daily heparin level, CBC, PT/INR 3. Follow closely for s/s bleeding.  Tad MooreJessica Dewanna Hurston, Pharm D, BCPS  Clinical Pharmacist Pager [phone]  09/25/2013 6:04 PM

## 2013-09-25 NOTE — Progress Notes (Signed)
ANTICOAGULATION CONSULT NOTE - Follow Up Consult  Pharmacy Consult for Heparin and Coumadin Indication: stroke, proximal right ICA thrombus  Allergies  Allergen Reactions  . Ibuprofen Other (See Comments)    Doctor told him he could not take    Patient Measurements: Height: 6' 4.5" (194.3 cm) Weight: 191 lb (86.637 kg) IBW/kg (Calculated) : 87.95 Heparin Dosing Weight: 87 kg  Vital Signs: Temp: 97.8 F (36.6 C) (02/22 0930) Temp src: Oral (02/22 0930) BP: 117/64 mmHg (02/22 0930) Pulse Rate: 61 (02/22 0930)  Labs:  Recent Labs  09/23/13 0530  09/24/13 0415 09/24/13 1240 09/25/13 0450  HGB 14.6  --  13.8  --  13.7  HCT 41.3  --  39.7  --  38.9*  PLT 269  --  238  --  244  LABPROT  --   --  13.4  --  13.9  INR  --   --  1.04  --  1.09  HEPARINUNFRC 0.61  < > 0.41 0.48 0.58  CREATININE  --   --  1.29  --   --   < > = values in this interval not displayed.  Estimated Creatinine Clearance: 83.9 ml/min (by C-G formula based on Cr of 1.29).  Assessment: 53 yo male with embolic CVA on heparin bridging to warfarin. Heparin levels yesterday were therapeutic x2 on 1150units/hr, however this morning heparin level increased to 0.58units/mL. INR remains slow to increase with a reading of 1.09 this morning. Hgb and platelets are very stable. No bleeding noted.  Goal of Therapy:  INR 2-3 Heparin level 0.3-0.5 units/ml Monitor platelets by anticoagulation protocol: Yes   Plan:   1. Decrease heparin to 1100units/hr 2. Heparin level in 6 hours 3. Warfarin 12.5mg  po x1 tonight 4. Daily heparin level, CBC, PT/INR 5. Follow closely for s/s bleeding  Timothy Dirocco D. Timothy Peck, PharmD, BCPS Clinical Pharmacist Pager: [phone] 09/25/2013 10:30 AM

## 2013-09-25 NOTE — Progress Notes (Signed)
Stroke Team Progress Note  HISTORY [name] is a 53 y.o. male with a history of coronary artery disease, myocardial infarction with LAD stent, pulmonary embolism and depression, presenting 09/21/2013 with new onset weakness and numbness  involving his left side. Symptoms were associated with feeling of disequilibrium at the onset. No previous history of stroke nor TIA. Patient had not been compliant with anticoagulation and had not taken Xarelto for several months. Patient also admited that he had not been taking Brilinta, as well. CT scan of his head showed no acute intracranial abnormality. CTA of the head and neck showed a hematoma at the origin of the right ICA causing a 50% stenosis. Study was otherwise unremarkable. No intracranial vascular abnormality was seen. NIH stroke score was 10. He was last known well at 1600 09/21/2013. Patient was not administerd TPA secondary to delay in arrival. He was admitted for further evaluation and treatment.  SUBJECTIVE The patient reports that he is doing much better. Yesterday he walked in the morning and in the evening unassisted. His wife is requesting that he be able to be up ad lib. Physical therapist reports that patient is extremely impulsive and demonstrates poor attention leaving patient at signifcant risk for injury    OBJECTIVE Most recent Vital Signs: Filed Vitals:   09/24/13 2046 09/24/13 2200 09/25/13 0122 09/25/13 0443  BP:  134/67 137/83 103/61  Pulse:  61 62 62  Temp:  98 F (36.7 C) 98.5 F (36.9 C) 97.9 F (36.6 C)  TempSrc:  Oral Oral Oral  Resp:  18 18 16   Height:      Weight:      SpO2: 100% 100% 100% 100%   CBG (last 3)  No results found for this basename: GLUCAP,  in the last 72 hours  IV Fluid Intake:   . heparin 1,150 Units/hr (09/24/13 2153)    MEDICATIONS  . atorvastatin  80 mg Oral q1800  . mometasone-formoterol  2 puff Inhalation BID  . multivitamin with minerals  1 tablet Oral Daily  . sodium chloride  3  mL Intravenous Q12H  . Ticagrelor  90 mg Oral BID  . Warfarin - Pharmacist Dosing Inpatient   Does not apply q1800   PRN:  acetaminophen, acetaminophen, feeding supplement (ENSURE COMPLETE), ondansetron (ZOFRAN) IV, ondansetron, senna-docusate  Diet:  General thin liquids Activity:  Up with assistance DVT Prophylaxis:  IV heparin / warfarin  CLINICALLY SIGNIFICANT STUDIES Basic Metabolic Panel:   Recent Labs Lab 09/22/13 0540 09/24/13 0415  NA 139 142  K 3.9 4.1  CL 105 106  CO2 22 23  GLUCOSE 108* 106*  BUN 16 11  CREATININE 1.34 1.29  CALCIUM 8.4 8.5   Liver Function Tests:   Recent Labs Lab 09/21/13 2222  AST 25  ALT 30  ALKPHOS 46  BILITOT 0.3  PROT 6.1  ALBUMIN 3.5   CBC:  Recent Labs Lab 09/21/13 2222  09/24/13 0415 09/25/13 0450  WBC 14.2*  < > 10.6* 8.4  NEUTROABS 11.5*  --   --   --   HGB 17.0  < > 13.8 13.7  HCT 46.9  < > 39.7 38.9*  MCV 89.5  < > 90.8 90.9  PLT 299  < > 238 244  < > = values in this interval not displayed. Coagulation:   Recent Labs Lab 09/21/13 2222 09/24/13 0415 09/25/13 0450  LABPROT 13.3 13.4 13.9  INR 1.03 1.04 1.09   Cardiac Enzymes:   Recent Labs Lab 09/21/13  2222 09/22/13 0540 09/22/13 0744  TROPONINI <0.30 <0.30 <0.30   Urinalysis: No results found for this basename: COLORURINE, APPERANCEUR, LABSPEC, PHURINE, GLUCOSEU, HGBUR, BILIRUBINUR, KETONESUR, PROTEINUR, UROBILINOGEN, NITRITE, LEUKOCYTESUR,  in the last 168 hours Lipid Panel    Component Value Date/Time   CHOL 154 09/22/2013 0540   TRIG 93 09/22/2013 0540   HDL 43 09/22/2013 0540   CHOLHDL 3.6 09/22/2013 0540   VLDL 19 09/22/2013 0540   LDLCALC 92 09/22/2013 0540   HgbA1C  Lab Results  Component Value Date   HGBA1C 5.6 09/22/2013    Urine Drug Screen:     Component Value Date/Time   LABOPIA NONE DETECTED 09/22/2013 0035   COCAINSCRNUR NONE DETECTED 09/22/2013 0035   LABBENZ NONE DETECTED 09/22/2013 0035   AMPHETMU NONE DETECTED 09/22/2013 0035    THCU POSITIVE* 09/22/2013 0035   LABBARB NONE DETECTED 09/22/2013 0035    Alcohol Level: No results found for this basename: ETH,  in the last 168 hours   CT of the brain  09/21/2013    1.  No acute intracranial findings. 2. No CT evidence of acute infarction. 3. No evidence of intracranial hemorrhage.   CT of the brain - secondary to severe headache 09/23/2013 1. Evolving patchy multifocal posterior right MCA territory infarct.  2. No associated mass effect or hemorrhage.  3. Stable brain elsewhere.   CT angio head 09/21/2013 No large vessel occlusion or hemodynamically significant stenosis.    CT angio neck 09/21/2013  Eccentric right internal carotid artery origin intimal hematoma resulting in 50% narrowing of the right ICA origin.    MRI of the brain   09/22/2013 Multiple areas of patchy, acute right MCA territory infarct with small amount of petechial hemorrhage.  MRA of the brain  See CT angio  2D Echocardiogram ejection fraction 60%. No cardiac source of embolism was identified.   Carotid Doppler  Preliminary findings: Right:There appears to be moderate to severe homogeneous plaque vs. thrombus noted in the proximal ICA. Bilateral: 1-39% ICA stenosis. Vertebral artery flow is antegrade.  CXR  07/18/2014 No active cardiopulmonary disease.  EKG  Sinus arrhythmia. For complete results please see formal report.   Therapy Recommendations - inpatient rehabilitation recommended ( rehabilitation M.D. consult pending )  Physical Exam   Sleeping - just woke up. Alert. Afebrile. Head is nontraumatic. Neck is supple without bruit. Hearing is normal. Cardiac exam no murmur or gallop. Lungs are clear to auscultation. Distal pulses are well felt. Neurological Exam : Follows simple commands. Speech is slight hesitant but fluent. Able to name and repeat. Follows commands. Slight left gaze preference but able to look to the right past midline. Blinks to threat bilaterally. Minimum right  lower facial asymmetry. Tongue midline. Moves both upper and lower extremity well against gravity. No focal weakness except left grip strength and fine finger movements are diminished. Plantars downgoing.  ASSESSMENT [name] is a 53 y.o. male presenting with dizziness, left-sided weakness and numbness. 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 MRI shows multiple areas of patchy, acute right MCA territory infarct with small amount of petechial hemorrhage. Infarct felt to be embolic secondary to hematoma in the right internal carotid artery. On Brilinta prior to admission. Now on heparin and warfarin for secondary stroke prevention. Patient with resultant left hemiparesis and dizziness, left hemisensory deficit. Work up complete.   History of pulmonary embolus in August 2014, anticoagulated with Xarelto, patient noncompliant with medication for several months.  LDL  92 - now on Lipitor 80 mg daily  THC positive, this is a risk factor for stroke. History of cocaine use, urine drug screen negative this admission.  Coronary artery disease-MI, angioplasty to the proximal LAD, stent placement 2013  Cigarette smoker  Anticoagulation - on heparin and warfarin per pharmacy protocol.  Severe headache yesterday - Head CT showed no new findings   Hospital day # 4  TREATMENT/PLAN  Continue heparin / coumadin for now for secondary stroke prevention. Dc heparin when INR optimal .   Rehab consult pending  No indication for MRA as CT angiography completed  Stroke workup is complete, will sign off, Dr. Pearlean Brownie to see following discharge within 2 months.   Delton See PA-C [facility] Pager [phone] 09/25/2013, 8:03 AM  I have personally obtained a history, examined the patient, evaluated imaging results, and formulated the assessment and plan of care. I agree with the above.  Lesly Dukes

## 2013-09-26 ENCOUNTER — Telehealth: Payer: Self-pay | Admitting: Cardiology

## 2013-09-26 LAB — HEPARIN LEVEL (UNFRACTIONATED): Heparin Unfractionated: 0.36 IU/mL (ref 0.30–0.70)

## 2013-09-26 LAB — PROTIME-INR
INR: 1.34 (ref 0.00–1.49)
Prothrombin Time: 16.3 seconds — ABNORMAL HIGH (ref 11.6–15.2)

## 2013-09-26 LAB — CBC
HCT: 39.8 % (ref 39.0–52.0)
Hemoglobin: 14 g/dL (ref 13.0–17.0)
MCH: 31.7 pg (ref 26.0–34.0)
MCHC: 35.2 g/dL (ref 30.0–36.0)
MCV: 90.2 fL (ref 78.0–100.0)
Platelets: 234 10*3/uL (ref 150–400)
RBC: 4.41 MIL/uL (ref 4.22–5.81)
RDW: 13.2 % (ref 11.5–15.5)
WBC: 7.6 10*3/uL (ref 4.0–10.5)

## 2013-09-26 MED ORDER — WARFARIN SODIUM 7.5 MG PO TABS
7.5000 mg | ORAL_TABLET | Freq: Once | ORAL | Status: AC
  Administered 2013-09-26: 7.5 mg via ORAL
  Filled 2013-09-26: qty 1

## 2013-09-26 NOTE — Progress Notes (Signed)
Occupational Therapy Treatment Patient Details Name: Timothy Peck MRN: [id number] DOB: 07/24/1961 Today's Date: 09/26/2013 Time: [id number] OT Time Calculation (min): 30 min  OT Assessment / Plan / Recommendation  History of present illness 53 y.o. admitted with left sided weakness and numbness. MRI of the brain showed multiple areas of patchy, acute right MCA territory infarct with small amount of petechial hemorrhage.   OT comments  Pt with apparent vision deficits-recommended NO driving and for pt to get a full visual field assessment. Tested vision during session and pt with apparent left field cut and had difficulty with saccades. Pt reports running into things on left side.   Follow Up Recommendations  Outpatient OT;Supervision - Intermittent -full visual field assessment   Barriers to Discharge       Equipment Recommendations  None recommended by OT    Recommendations for Other Services    Frequency Min 2X/week   Progress towards OT Goals Progress towards OT goals: Progressing toward goals  Plan Discharge plan needs to be updated    Precautions / Restrictions Precautions Precautions: Fall Restrictions Weight Bearing Restrictions: No   Pertinent Vitals/Pain No pain reported.     ADL  Lower Body Bathing: Supervision/safety Where Assessed - Lower Body Bathing: Supported standing Toilet Transfer: Community education officerndependent Toilet Transfer Method: Sit to [place] Toilet Transfer Equipment:  (chair) Tub/Shower Transfer: Supervision/safety Tub/Shower Transfer Method: Science writerAmbulating Tub/Shower Transfer Equipment: Other (comment) (practiced stepping over) Equipment Used: Gait belt;Other (comment) (squeeze ball) Transfers/Ambulation Related to ADLs: Supervision for ambulation and Independent with sit to stand transfers. Supervision for simulated tub transfer. ADL Comments: Session focused on further assessment of vision as well as LUE. Pt presents with decreased gross motor coordination of LUE and  also apparent vision deficits. Pt with difficulty with saccades as well as apparent left field cut (pt missing several on field testing). Recommended NO driving and for pt to get a visual field assessment done (Humphrey 120.3). Had pt participate in tossing ball and also while counting-pt had difficulty with task. Gave pt activities he could be doing at home to help with vision and discussed that pt turn his head to look to left side. Pt reports he has been running into things on left side.  Pt also had difficulty sustaining gaze when tracking to left side at times. Recommended fiance be with him for tub transfer for safety.    OT Diagnosis:    OT Problem List:   OT Treatment Interventions:     OT Goals(current goals can now be found in the care plan section) Acute Rehab OT Goals Patient Stated Goal: go home OT Goal Formulation: With patient Time For Goal Achievement: 09/29/13 Potential to Achieve Goals: Good ADL Goals Pt Will Perform Upper Body Dressing: with modified independence;sitting Pt Will Perform Lower Body Dressing: with modified independence;sit to/from stand Pt Will Transfer to Toilet: with modified independence;ambulating;regular height toilet Pt Will Perform Toileting - Clothing Manipulation and hygiene: with modified independence;sit to/from stand Pt Will Perform Tub/Shower Transfer: Tub transfer;with supervision;ambulating Additional ADL Goal #1: pt will independently participate in vision exercises/activities.  Visit Information  Last OT Received On: 09/26/13 Assistance Needed: +1 History of Present Illness: 53 y.o. admitted with left sided weakness and numbness. MRI of the brain showed multiple areas of patchy, acute right MCA territory infarct with small amount of petechial hemorrhage.    Subjective Data      Prior Functioning       Cognition  Cognition Arousal/Alertness: Awake/alert Behavior During Therapy: [facility] for  tasks assessed/performed Overall Cognitive  Status: Within Functional Limits for tasks assessed    Mobility  Transfers Overall transfer level: Independent Equipment used: None    Exercises      Balance    End of Session OT - End of Session Equipment Utilized During Treatment: Gait belt Activity Tolerance: Patient tolerated treatment well Patient left: in chair;with call bell/phone within reach Nurse Communication: Other (comment) (vision)  GO     Earlie Raveling OTR/L [phone] 09/26/2013, 6:15 PM

## 2013-09-26 NOTE — Progress Notes (Signed)
Physical Therapy Treatment Patient Details Name: Timothy Peck MRN: [id number] DOB: 1960-08-20 Today's Date: 09/26/2013 Time: 4098-1191 PT Time Calculation (min): 24 min  PT Assessment / Plan / Recommendation  History of Present Illness 53 y.o. admitted with left sided weakness and numbness. MRI results pending.   PT Comments   Patient at baseline for mobility. Independent with activities, DGI performed and WNL. Educated patient on BEFAST stroke education and importance of safety with mobility. No futher acute PT needs. PT will sign off. Patient in agreement.  Follow Up Recommendations  No PT follow up           Equipment Recommendations  None recommended by PT       Frequency Other (Comment) (discharge PT)   Progress towards PT Goals Progress towards PT goals: Goals met/education completed, patient discharged from PT  Plan Discharge plan needs to be updated    Precautions / Restrictions Restrictions Weight Bearing Restrictions: No   Pertinent Vitals/Pain No pain     Mobility  Bed Mobility Overal bed mobility: Independent Transfers Overall transfer level: Independent Ambulation/Gait Ambulation/Gait assistance: Independent Ambulation Distance (Feet): 810 Feet Assistive device: None Gait Pattern/deviations: WFL(Within Functional Limits) Gait velocity: wfl Gait velocity interpretation: at or above normal speed for age/gender Stairs: Yes Stairs assistance: Supervision Stair Management: One rail Right;Alternating pattern Number of Stairs: 12 Modified Rankin (Stroke Patients Only) Pre-Morbid Rankin Score: No symptoms Modified Rankin: No symptoms      PT Goals (current goals can now be found in the care plan section) Acute Rehab PT Goals Patient Stated Goal: i want to go home  Visit Information  Last PT Received On: 09/26/13 Assistance Needed: +1 History of Present Illness: 53 y.o. admitted with left sided weakness and numbness. MRI results pending.    Subjective  Data  Subjective: I feel much better now Patient Stated Goal: i want to go home   Cognition  Cognition Arousal/Alertness: Awake/alert Behavior During Therapy: Impulsive    Balance  Balance Overall balance assessment: Independent Standardized Balance Assessment Standardized Balance Assessment : Dynamic Gait Index Dynamic Gait Index Level Surface: Normal Change in Gait Speed: Normal Gait with Horizontal Head Turns: Normal Gait with Vertical Head Turns: Normal Gait and Pivot Turn: Normal Step Over Obstacle: Normal Step Around Obstacles: Normal Steps: Normal Total Score: 24  End of Session PT - End of Session Equipment Utilized During Treatment: Gait belt Activity Tolerance: Patient tolerated treatment well Patient left: in chair;with call bell/phone within reach Nurse Communication: Mobility status   GP     Duncan Dull 09/26/2013, 2:36 PM Alben Deeds, [place] DPT  [phone]

## 2013-09-26 NOTE — Progress Notes (Signed)
ANTICOAGULATION CONSULT NOTE - Follow Up Consult  Pharmacy Consult for Heparin and Coumadin Indication: stroke, proximal right ICA thrombus  Allergies  Allergen Reactions  . Ibuprofen Other (See Comments)    Doctor told him he could not take    Patient Measurements: Height: 6' 4.5" (194.3 cm) Weight: 191 lb (86.637 kg) IBW/kg (Calculated) : 87.95 Heparin Dosing Weight: 87 kg  Vital Signs: Temp: 98 F (36.7 C) (02/23 0916) Temp src: Oral (02/23 0916) BP: 126/83 mmHg (02/23 0916) Pulse Rate: 54 (02/23 0916)  Labs:  Recent Labs  09/24/13 0415  09/25/13 0450 09/25/13 1650 09/26/13 0530  HGB 13.8  --  13.7  --  14.0  HCT 39.7  --  38.9*  --  39.8  PLT 238  --  244  --  234  LABPROT 13.4  --  13.9  --  16.3*  INR 1.04  --  1.09  --  1.34  HEPARINUNFRC 0.41  < > 0.58 0.35 0.36  CREATININE 1.29  --   --   --   --   < > = values in this interval not displayed.  Estimated Creatinine Clearance: 83.9 ml/min (by C-G formula based on Cr of 1.29).  Assessment: 53yo male with new embolic stroke after Xarelto noncompliance.   AC - new ischemic CVA due to R ICA thrombus on IV hepairn and warfarin. Hx PE 03/2013. Heparin was dc'd by Dr. Roseanne RenoStewart d/t intimal hematoma on CT, however MRI negative for hemorrhagic conversion. Heparin resumed. HL therapeutic on 1100units/hr. INR rising appropriately on warfarin.  Card - HLD, CAD, MI and DES to LAD in 2013; NSTEM/cath 07/18/13 = stent patent, normal EF. ? myo-percarditis. BP stable, HR brady- Brilinta, Lipitor 80  ID - afb, WBC wnl; no abx Endo - glucs good GI/Nutr - LFTs ok; reg diet, multivit Neuro - new ischemic right MCA stroke. hx depression, noncompliant, mood noted stable. UDS + for THC Neph - Scr 1.29, crcl ~80, lytes good (2/21) Pulm - smoker. RA; Dulera Heme/Onc - CBC stable, no bleeding noted PTA Med Issues: wasn't taking meds pta for ~2 months. Was supposed to be on Xarelto, Brilinta, Lisin, Metop, Dulera, Paxil, Seroquel,  Vit C Best Practices: IV hep, bridging to warf  Goal of Therapy:  INR 2-3 Heparin level 0.3-0.5 units/ml, lower end of therapeutic range Monitor platelets by anticoagulation protocol: Yes   Plan:   - Continue heparin to 1100units/hr  - daily HL and CBC - warfarin 7.5mg  po x1 tonight  - warfarin book ordered  Note to MD: Please add aspirin if patient is to remain on Brilinta therapy.   Link SnufferJessica Francoise Chojnowski, PharmD, BCPS Clinical Pharmacist [phone]  09/26/2013 11:22 AM

## 2013-09-26 NOTE — Telephone Encounter (Signed)
Pt is in the hospital,had a stroke on 09-21-13. He said they said he had this stroke because he had not taken his medicine. He said he could not get the medicine because the pharmacist said it was not approve by this office.Pt said please call him,he needs his medicine. He is still in [facility] in room 4 north,room 10.

## 2013-09-26 NOTE — Discharge Instructions (Signed)
Information on my medicine - Coumadin   (Warfarin)  This medication education was reviewed with me or my healthcare representative as part of my discharge preparation.  The pharmacist that spoke with me during my hospital stay was:  Fayne Norrie, [facility]  Why was Coumadin prescribed for you? Coumadin was prescribed for you because you have a blood clot or a medical condition that can cause an increased risk of forming blood clots. Blood clots can cause serious health problems by blocking the flow of blood to the heart, lung, or brain. Coumadin can prevent harmful blood clots from forming. As a reminder your indication for Coumadin is:   Select from menu Stroke from R-ICA thrombus.   What test will check on my response to Coumadin? While on Coumadin (warfarin) you will need to have an INR test regularly to ensure that your dose is keeping you in the desired range. The INR (international normalized ratio) number is calculated from the result of the laboratory test called prothrombin time (PT).  If an INR APPOINTMENT HAS NOT ALREADY BEEN MADE FOR YOU please schedule an appointment to have this lab work done by your health care provider within 7 days. Your INR goal is usually a number between:  2 to 3 or your provider may give you a more narrow range like 2-2.5.  Ask your health care provider during an office visit what your goal INR is.  What  do you need to  know  About  COUMADIN? Take Coumadin (warfarin) exactly as prescribed by your healthcare provider about the same time each day.  DO NOT stop taking without talking to the doctor who prescribed the medication.  Stopping without other blood clot prevention medication to take the place of Coumadin may increase your risk of developing a new clot or stroke.  Get refills before you run out.  What do you do if you miss a dose? If you miss a dose, take it as soon as you remember on the same day then continue your regularly scheduled regimen the  next day.  Do not take two doses of Coumadin at the same time.  Important Safety Information A possible side effect of Coumadin (Warfarin) is an increased risk of bleeding. You should call your healthcare provider right away if you experience any of the following:   Bleeding from an injury or your nose that does not stop.   Unusual colored urine (red or dark brown) or unusual colored stools (red or black).   Unusual bruising for unknown reasons.   A serious fall or if you hit your head (even if there is no bleeding).  Some foods or medicines interact with Coumadin (warfarin) and might alter your response to warfarin. To help avoid this:   Eat a balanced diet, maintaining a consistent amount of Vitamin K.   Notify your provider about major diet changes you plan to make.   Avoid alcohol or limit your intake to 1 drink for women and 2 drinks for men per day. (1 drink is 5 oz. wine, 12 oz. beer, or 1.5 oz. liquor.)  Make sure that ANY health care provider who prescribes medication for you knows that you are taking Coumadin (warfarin).  Also make sure the healthcare provider who is monitoring your Coumadin knows when you have started a new medication including herbals and non-prescription products.  Coumadin (Warfarin)  Major Drug Interactions  Increased Warfarin Effect Decreased Warfarin Effect  Alcohol (large quantities) Antibiotics (esp. Septra/Bactrim, Flagyl, Cipro) Amiodarone (  Cordarone) Aspirin (ASA) Cimetidine (Tagamet) Megestrol (Megace) NSAIDs (ibuprofen, naproxen, etc.) Piroxicam (Feldene) Propafenone (Rythmol SR) Propranolol (Inderal) Isoniazid (INH) Posaconazole (Noxafil) Barbiturates (Phenobarbital) Carbamazepine (Tegretol) Chlordiazepoxide (Librium) Cholestyramine (Questran) Griseofulvin Oral Contraceptives Rifampin Sucralfate (Carafate) Vitamin K   Coumadin (Warfarin) Major Herbal Interactions  Increased Warfarin Effect Decreased Warfarin Effect   Garlic Ginseng Ginkgo biloba Coenzyme Q10 Green tea St. Johns wort    Coumadin (Warfarin) FOOD Interactions  Eat a consistent number of servings per week of foods HIGH in Vitamin K (1 serving =  cup)  Collards (cooked, or boiled & drained) Kale (cooked, or boiled & drained) Mustard greens (cooked, or boiled & drained) Parsley *serving size only =  cup Spinach (cooked, or boiled & drained) Swiss chard (cooked, or boiled & drained) Turnip greens (cooked, or boiled & drained)  Eat a consistent number of servings per week of foods MEDIUM-HIGH in Vitamin K (1 serving = 1 cup)  Asparagus (cooked, or boiled & drained) Broccoli (cooked, boiled & drained, or raw & chopped) Brussel sprouts (cooked, or boiled & drained) *serving size only =  cup Lettuce, raw (green leaf, endive, romaine) Spinach, raw Turnip greens, raw & chopped   These websites have more information on Coumadin (warfarin):  http://www.king-russell.com/www.coumadin.com; https://www.hines.net/www.ahrq.gov/consumer/coumadin.htm;  Link SnufferJessica Bienvenido Proehl, PharmD, BCPS Clinical Pharmacist [phone] 09/26/2013, 3:15 PM

## 2013-09-26 NOTE — Telephone Encounter (Signed)
Pt is on the Inpatient Service.  Will defer to Dr. Donneta RombergHarding/Sharon.  Message forwarded to Dr. Donneta RombergHarding/Sharon, RN.

## 2013-09-26 NOTE — Progress Notes (Signed)
Physical Therapy Discharge Patient Details Name: Timothy Peck MRN: [id number] DOB: [birth year] Today's Date: 09/26/2013 Time: 3403-5248 PT Time Calculation (min): 24 min  Patient discharged from PT services secondary to goals met and no further PT needs identified.  Please see latest therapy progress note for current level of functioning and progress toward goals.    Progress and discharge plan discussed with patient and/or caregiver: Patient/Caregiver agrees with plan  GP     Rolla Etienne, PT DPT  [phone]  09/26/2013, 2:38 PM

## 2013-09-26 NOTE — Progress Notes (Signed)
INTERNAL MEDICINE TEACHING SERVICE DAILY PROGRESS NOTE  Subjective: No acute complaints today.  He is very pleased with his own improvement.  He notes that he walked several laps around the hospital floor last night without issue.  He also notes that his left hand is much more steady. Objective: Vital signs in last 24 hours: Filed Vitals:   09/25/13 2218 09/26/13 0129 09/26/13 0552 09/26/13 0916  BP: 140/82 129/79 122/79 126/83  Pulse: 56 54 66 54  Temp: 98.2 F (36.8 C) 97.9 F (36.6 C) 97.6 F (36.4 C) 98 F (36.7 C)  TempSrc: Oral Oral Oral Oral  Resp: 16 16 16 16   Height:      Weight:      SpO2: 100% 96% 100% 100%   Weight change:   Intake/Output Summary (Last 24 hours) at 09/26/13 1425 Last data filed at 09/26/13 1411  Gross per 24 hour  Intake 1315.58 ml  Output   1000 ml  Net 315.58 ml   General: resting in bed, no acute distress HEENT: PERRL, EOMI, no scleral icterus. Cardiac: RRR, no rubs, murmurs or gallops Pulm: clear to auscultation bilaterally, moving normal volumes of air Abd: soft, nontender, nondistended, BS present Ext: warm and well perfused, no pedal edema Neuro: alert and oriented X3. 5/5 gross motor strength of upper and lower extremities, no noticeable difference in grip strength between right and left hand.  No cranial nerve deficit appreciated.  Lab Results: Basic Metabolic Panel:  Recent Labs Lab 09/22/13 0540 09/24/13 0415  NA 139 142  K 3.9 4.1  CL 105 106  CO2 22 23  GLUCOSE 108* 106*  BUN 16 11  CREATININE 1.34 1.29  CALCIUM 8.4 8.5   Liver Function Tests:  Recent Labs Lab 09/21/13 2222  AST 25  ALT 30  ALKPHOS 46  BILITOT 0.3  PROT 6.1  ALBUMIN 3.5   CBC:  Recent Labs Lab 09/21/13 2222  09/25/13 0450 09/26/13 0530  WBC 14.2*  < > 8.4 7.6  NEUTROABS 11.5*  --   --   --   HGB 17.0  < > 13.7 14.0  HCT 46.9  < > 38.9* 39.8  MCV 89.5  < > 90.9 90.2  PLT 299  < > 244 234  < > = values in this interval not  displayed. Cardiac Enzymes:  Recent Labs Lab 09/21/13 2222 09/22/13 0540 09/22/13 0744  TROPONINI <0.30 <0.30 <0.30   CBG:  Recent Labs Lab 09/21/13 2235  GLUCAP 155*   Hemoglobin A1C:  Recent Labs Lab 09/22/13 0540  HGBA1C 5.6   Fasting Lipid Panel:  Recent Labs Lab 09/22/13 0540  CHOL 154  HDL 43  LDLCALC 92  TRIG 93  CHOLHDL 3.6   Thyroid Function Tests:  Recent Labs Lab 09/22/13 0540  TSH 0.645   Coagulation:  Recent Labs Lab 09/21/13 2222 09/24/13 0415 09/25/13 0450 09/26/13 0530  LABPROT 13.3 13.4 13.9 16.3*  INR 1.03 1.04 1.09 1.34    Urine Drug Screen: Drugs of Abuse     Component Value Date/Time   LABOPIA NONE DETECTED 09/22/2013 0035   COCAINSCRNUR NONE DETECTED 09/22/2013 0035   LABBENZ NONE DETECTED 09/22/2013 0035   AMPHETMU NONE DETECTED 09/22/2013 0035   THCU POSITIVE* 09/22/2013 0035   LABBARB NONE DETECTED 09/22/2013 0035     Studies/Results: No results found. Medications: I have reviewed the patient's current medications. Scheduled Meds: . atorvastatin  80 mg Oral q1800  . mometasone-formoterol  2 puff Inhalation BID  . multivitamin with  minerals  1 tablet Oral Daily  . sodium chloride  3 mL Intravenous Q12H  . Ticagrelor  90 mg Oral BID  . warfarin  7.5 mg Oral ONCE-1800  . Warfarin - Pharmacist Dosing Inpatient   Does not apply q1800   Continuous Infusions: . 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 Neuro following. On Brilinta prior to admission (non compliant with Xarelto prior to admission). Now on heparin and warfarin for secondary stroke prevention. Plan - will transition to warfarin to treat thrombus, prevent further embolization, INR remains  subtheraputic -Continue Brilinta, per Neurology patient does not need antiplatelet therapy at this time, once off of Warfarin will need either Clopidogrel or Aspirin. -Continue atorvastatin - will eventually need hypercoagulability workup given arterial and venous thrombosis (cannot due in acute setting/on anticoagulant medications)  # CAD S/P percutaneous coronary angioplasty 02/21/12 non-STEMI -Promus DES 4.0 mm 20 mm to prox LAD - Continue brilinta - Hold antihypertensives    Hx Pulmonary embolism  - Previously non compliant with Xarelto, will need treatment for ICA thrombus with warfarin. -  Needs hypercoagulability workup in future.  CKD stage II -No acute issues at this time.  Dispo: Disposition is deferred at this time, awaiting improvement of current medical problems.  Patient needs to be therapeutic on Warfarin.  The patient does not have a current PCP (Pcp Not In System) and does need an [facility] hospital follow-up appointment after discharge.  The patient does not know have transportation limitations that hinder transportation to clinic appointments.  .Services Needed at time of discharge: Y = Yes, Blank = No PT: y  OT:   RN:   Equipment:   Other:     LOS: 5 days   Carlynn Purl, DO 09/26/2013, 2:25 PM

## 2013-09-26 NOTE — Care Management Note (Signed)
    Page 1 of 2   09/27/2013     1:22:41 PM   CARE MANAGEMENT NOTE 09/27/2013  Patient:  Timothy Peck, Timothy Peck   Account Number:  [id number]  Date Initiated:  09/26/2013  Documentation initiated by:  Lorne Skeens  Subjective/Objective Assessment:   Patient admitted with CVA. Lives at home with spouse     Action/Plan:   will follow for discharge needs   Anticipated DC Date:     Anticipated DC Plan:        DC Planning Services  CM consult  OP Neuro Rehab      Choice offered to / List presented to:  C-1 Patient           Status of service:  Completed, signed off Medicare Important Message given?   (If response is "NO", the following Medicare IM given date fields will be blank) Date Medicare IM given:   Date Additional Medicare IM given:    Discharge Disposition:  HOME/SELF CARE  Per UR Regulation:  Reviewed for med. necessity/level of care/duration of stay  If discussed at [place] of Stay Meetings, dates discussed:    Comments:  09/27/13 Andrew, MSN, CM- Per conversation with Dr Heber , CM met with patient to discuss outpatient OT services.  Patient is agreeable and has selected the [facility].  Information was faxed. RN updated.   09/26/13 [place], MSN, CM- Recieved consult regarding medication needs.  CM spoke with patient, who seemed unsure of why he was having difficulty getting his meds, but reports the pharmacy wouldn't fill them for him. CM contacted [facility] on [address] in [place], [place] to clarify medication issues.  Per pharmacist, all medications are a $3 copy with the exception of Brilinta.  Medicaid requires a prior authorization for Brilinta, which was reportedly faxed to Dr Allison Quarry (cardiology) office but never recieved back. Per [facility] pharmacist, prior authorization form will be re-faxed.  Patient is unable to use the 30 day free trial, as he has already utilized that program.  CM  discussed the importance of following up with Dr Allison Quarry office to ensure the prior authorization was completed so his medications can be filled at discharge.  Patient states that he has a PCP assigned by Medicaid that he follows with, but he is not satisfied.  CM provided the [facility]'s information so patient could see if they are able to take him as a new patient.  Patient was encouraged to call as soon as possible, and it was stressed that patient should continue to see his current PCP until a new one is obtained.

## 2013-09-26 NOTE — Consult Note (Signed)
Physical Medicine and Rehabilitation Consult Reason for Consult: CVA Referring Physician: [facility]   HPI: Timothy Peck is a 53 y.o. right-handed male with history of CAD status post stent, pulmonary emboli August 2014 on xarelto, tobacco abuse and medical noncompliance. Admitted 09/22/2013 with left-sided weakness. MRI of the brain showed multiple areas of patchy, acute right MCA territory infarct with small amount of petechial hemorrhage. Echocardiogram with ejection fraction of 60% no wall motion abnormalities without emboli. Carotid Dopplers completed showing what appeared to be moderate to severe homogenous plaque versus thrombus noted in the proximal right ICA. Bilateral 1-39% ICA stenosis. Patient did not receive TPA. CTA head and neck showed eccentric right ICA origin intimal hematoma resulting in 50% narrowing of the right ICA origin CTA of head with no occlusion. Coumadin initiated for suspect right proximal ICA thrombus. Patient had been on xarelto prior to admission but was noncompliant. He is tolerating a regular diet. Physical therapy evaluation completed 09/23/2013 with recommendations for physical medicine rehabilitation consult   Review of Systems  Cardiovascular: Positive for chest pain.  Gastrointestinal: Positive for constipation.  Neurological: Positive for dizziness.  Psychiatric/Behavioral: Positive for depression.  All other systems reviewed and are negative.   Past Medical History  Diagnosis Date  . Depression   . Degenerative disc disease, cervical     Dx years ago  . CAD (coronary artery disease) 02/2012    NSTEMI - PCI to prox LAD  . Presence of drug coated stent in LAD coronary artery 02/2012    Promus Element 4.0 mm x 28 mm  . MI (myocardial infarction) 02/02/2012    Normal 2D Echo - EF-60  . Hematoma, s/p cath -rt wrist 12/22/2012  . Chest pain, stable cardiac cath 12/2012 12/22/2012  . NSTEMI (non-ST elevated myocardial infarction), 07/18/13, stable cath  with patent stent. 01/31/2012  . Chronic anticoagulation, since 03/2013 for PE 07/19/2013   Past Surgical History  Procedure Laterality Date  . Tumor removal      left foot third toe  . Anterior cervical decomp/discectomy fusion  11/28/2011    Procedure: ANTERIOR CERVICAL DECOMPRESSION/DISCECTOMY FUSION 1 LEVEL;  Surgeon: Temple Pacini, MD;  Location: [facility];  Service: Neurosurgery;  Laterality: N/A;  Anterior Cervical Six-Seven Decompression and Fusion  . Cardiac catheterization  01/31/2012    Promeus Element DES 4 x 28, balloon-Emerge monorail  . Cardiac catheterization  12/17/12    patent stent, stable CAD   Family History  Problem Relation Age of Onset  . Anesthesia problems Neg Hx   . Hypotension Neg Hx   . Malignant hyperthermia Neg Hx   . Pseudochol deficiency Neg Hx   . Coronary artery disease Father   . Aneurysm Mother 45    intracranial aneurysm rupture  . Stroke Father 57   Social History:  reports that he has been smoking Cigarettes.  He started smoking about 42 years ago. He has a 21 pack-year smoking history. He has never used smokeless tobacco. He reports that he uses illicit drugs (Cocaine, Marijuana, and Other-see comments). He reports that he does not drink alcohol. Allergies:  Allergies  Allergen Reactions  . Ibuprofen Other (See Comments)    Doctor told him he could not take   Medications Prior to Admission  Medication Sig Dispense Refill  . Ascorbic Acid (VITAMIN C) 1000 MG tablet Take 1,000 mg by mouth daily.        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  Very unsteady when attempting to add any activity to tasks.    ADL: ADL Grooming: Wash/dry face;Wash/dry hands;Min guard Where Assessed - Grooming: Supported standing Lower Body Dressing: Minimal assistance Where Assessed - Lower Body Dressing: Unsupported sit to stand Toilet Transfer: Min guard;Minimal assistance Toilet Transfer Method: Sit to Baristastand Toilet Transfer Equipment: Regular height toilet;Grab bars Tub/Shower Transfer Method: Not assessed Equipment Used: Gait belt Transfers/Ambulation Related to ADLs: Min A for ambulation and Min guard/Min A for transfers. ADL Comments: Told pt to avoid hot surfaces and sharp objects with LUE due to decreased sensation. Recommended sitting to get dressed. Recommended to stop smoking.  Cognition: Cognition Overall Cognitive Status: Impaired/Different from baseline Arousal/Alertness: Lethargic Orientation Level: Oriented X4 Attention: Sustained Sustained Attention: Impaired Sustained Attention Impairment: Verbal basic (Ativan given earlier today) Memory:  (recalled info from MD re: stroke etiology, assess further) Awareness:  (verbalized intellectual awareness, will assess further) Problem Solving: Impaired Problem Solving Impairment: Verbal basic (minimal deficits) Executive Function: Organizing;Self Monitoring;Self Correcting Safety/Judgment: Impaired Cognition Arousal/Alertness: Lethargic;Suspect due to medications Behavior During Therapy: Impulsive Overall Cognitive  Status: Impaired/Different from baseline Area of Impairment: Attention;Safety/judgement;Awareness;Problem solving Current Attention Level: Selective Safety/Judgement: Decreased awareness of safety Awareness: Emergent Problem Solving: Slow processing;Requires verbal cues General Comments: Patient unable to problem solve how to turn on the sink, patient with decreased/inattention to the left side requires cues to attend.  Poor gaze stabilization during activity.  Blood pressure 129/79, pulse 54, temperature 97.9 F (36.6 C), temperature source Oral, resp. rate 16, height 6' 4.5" (1.943 m), weight 86.637 kg (191 lb), SpO2 96.00%. Physical Exam  Vitals reviewed. Constitutional: He is oriented to person, place, and time. He appears well-developed.  HENT:  Head: Normocephalic.  Eyes: EOM are normal.  Neck: Normal range of motion. Neck supple. No thyromegaly present.  Cardiovascular: Normal rate and regular rhythm.   Respiratory: Effort normal and breath sounds normal. No respiratory distress.  GI: Soft. Bowel sounds are normal. He exhibits no distension.  Neurological: He is alert and oriented to person, place, and time.  Follow simple commands. Some decrease in fine motor skills. Strength near 5/5 both ue's, perhaps a shade less on left. LE's grossly 5/5 as well. Senses pain and pp in Ue's and LE's  Skin: Skin is warm and dry.  Psychiatric: He has a normal mood and affect. His behavior is normal. Thought content normal.    Results for orders placed during the hospital encounter of 09/21/13 (from the past 24 hour(s))  HEPARIN LEVEL (UNFRACTIONATED)     Status: None   Collection Time    09/25/13  4:50 PM      Result Value Ref Range   Heparin Unfractionated 0.35  0.30 - 0.70 IU/mL   No results found.  Assessment/Plan: Diagnosis: Right MCA  infarct 1. Does the need for close, 24 hr/day medical supervision in concert with the patient's rehab needs make it unreasonable for this patient to be  served in a less intensive setting? No 2. Co-Morbidities requiring supervision/potential complications: cad, etoh abuse 3. Due to bladder management, bowel management, safety, skin/wound care, disease management, medication administration, pain management and patient education, does the patient require 24 hr/day rehab nursing? No 4. Does the patient require coordinated care of a physician, rehab nurse, PT, OT to address physical and functional deficits in the context of the above medical diagnosis(es)? No Addressing deficits in the following areas: balance, endurance, locomotion, strength, transferring, bowel/bladder control, bathing, feeding, grooming and toileting 5. Can the patient actively participate in an intensive therapy program of at least 3 hrs of therapy per day at least 5 days per week? Potentially 6. The potential for patient to make measurable gains while on inpatient rehab is n/a 7. Anticipated functional outcomes upon discharge from inpatient rehab are n/a with PT, n/a with OT, n/a with SLP. 8. Estimated rehab length of stay to reach the above functional goals is: n/a 9. Does the patient have adequate social supports to accommodate these discharge functional goals? Potentially 10. Anticipated D/C setting: Home 11. Anticipated post D/C treatments: HH therapy 12. Overall Rehab/Functional Prognosis: excellent  RECOMMENDATIONS: This patient's condition is appropriate for continued rehabilitative care in the following setting: [facility] therapy Patient has agreed to participate in recommended program. Yes Note that insurance prior authorization may be required for reimbursement for recommended care.  Comment: Seems to have made substantial progress. Needs follow up evals today but can likely go home.  Ranelle Oyster, MD, [facility] Physical Medicine & Rehabilitation     09/26/2013

## 2013-09-26 NOTE — Telephone Encounter (Signed)
This seems like a problem with his pharmacy & his understanding.  He should have renewable refills on his meds.    The inpt team can handle it.    [name],Velma Hanna W, MD

## 2013-09-27 DIAGNOSIS — F17200 Nicotine dependence, unspecified, uncomplicated: Secondary | ICD-10-CM

## 2013-09-27 LAB — CBC
HEMATOCRIT: 37.9 % — AB (ref 39.0–52.0)
Hemoglobin: 13.3 g/dL (ref 13.0–17.0)
MCH: 31.8 pg (ref 26.0–34.0)
MCHC: 35.1 g/dL (ref 30.0–36.0)
MCV: 90.7 fL (ref 78.0–100.0)
Platelets: 226 10*3/uL (ref 150–400)
RBC: 4.18 MIL/uL — ABNORMAL LOW (ref 4.22–5.81)
RDW: 13.4 % (ref 11.5–15.5)
WBC: 9.1 10*3/uL (ref 4.0–10.5)

## 2013-09-27 LAB — PROTIME-INR
INR: 1.64 — AB (ref 0.00–1.49)
PROTHROMBIN TIME: 19 s — AB (ref 11.6–15.2)

## 2013-09-27 LAB — HEPARIN LEVEL (UNFRACTIONATED): Heparin Unfractionated: 0.37 IU/mL (ref 0.30–0.70)

## 2013-09-27 MED ORDER — LISINOPRIL 5 MG PO TABS
5.0000 mg | ORAL_TABLET | Freq: Every day | ORAL | Status: DC
  Administered 2013-09-27: 5 mg via ORAL
  Filled 2013-09-27: qty 1

## 2013-09-27 MED ORDER — METOPROLOL TARTRATE 12.5 MG HALF TABLET: 12.5000 mg | ORAL_TABLET | Freq: Two times a day (BID) | ORAL | Status: DC

## 2013-09-27 MED ORDER — ADULT MULTIVITAMIN W/MINERALS CH: 1.0000 | ORAL_TABLET | Freq: Every day | ORAL | Status: DC

## 2013-09-27 MED ORDER — ATORVASTATIN CALCIUM 80 MG PO TABS: 80.0000 mg | ORAL_TABLET | Freq: Every day | ORAL | Status: DC

## 2013-09-27 MED ORDER — WARFARIN SODIUM 7.5 MG PO TABS
7.5000 mg | ORAL_TABLET | Freq: Once | ORAL | Status: DC
Start: 2013-09-27 — End: 2013-09-27
  Filled 2013-09-27: qty 1

## 2013-09-27 MED ORDER — MOMETASONE FURO-FORMOTEROL FUM 100-5 MCG/ACT IN AERO: 2.0000 | INHALATION_SPRAY | Freq: Two times a day (BID) | RESPIRATORY_TRACT | Status: DC

## 2013-09-27 MED ORDER — WARFARIN SODIUM 7.5 MG PO TABS: 7.5000 mg | ORAL_TABLET | Freq: Once | ORAL | Status: DC

## 2013-09-27 MED ORDER — CLOPIDOGREL BISULFATE 75 MG PO TABS
75.0000 mg | ORAL_TABLET | Freq: Every day | ORAL | Status: DC
  Administered 2013-09-27: 75 mg via ORAL
  Filled 2013-09-27: qty 1

## 2013-09-27 MED ORDER — ASPIRIN EC 325 MG PO TBEC: 325.0000 mg | DELAYED_RELEASE_TABLET | Freq: Every day | ORAL | Status: DC

## 2013-09-27 MED ORDER — LISINOPRIL 5 MG PO TABS: 5.0000 mg | ORAL_TABLET | Freq: Every day | ORAL | Status: DC

## 2013-09-27 NOTE — Discharge Summary (Signed)
Name: Timothy Peck MRN: [id number] DOB: April 23, 1961 53 y.o. PCP: Pcp Not In System  Date of Admission: 09/21/2013 10:20 PM Date of Discharge: 09/27/2013 Attending Physician: Gardiner Barefoot, MD  Discharge Diagnosis: Principal Problem:   Acute ischemic stroke Active Problems:   Substance abuse   Tobacco abuse   CAD S/P percutaneous coronary angioplasty 02/21/12 non-STEMI -Promus DES 4.0 mm 20 mm to prox LAD   Pulmonary embolism and infarction, 03/13/13, on Xarelto  Discharge Medications:   Medication List         aspirin EC 325 MG tablet  Take 1 tablet (325 mg total) by mouth daily.     atorvastatin 80 MG tablet  Commonly known as:  LIPITOR  Take 1 tablet (80 mg total) by mouth daily at 6 PM.     lisinopril 5 MG tablet  Commonly known as:  PRINIVIL,ZESTRIL  Take 1 tablet (5 mg total) by mouth daily.     metoprolol tartrate 12.5 mg Tabs tablet  Commonly known as:  LOPRESSOR  Take 0.5 tablets (12.5 mg total) by mouth 2 (two) times daily.     mometasone-formoterol 100-5 MCG/ACT Aero  Commonly known as:  DULERA  Inhale 2 puffs into the lungs 2 (two) times daily.     multivitamin with minerals Tabs tablet  Take 1 tablet by mouth daily.     vitamin C 1000 MG tablet  Take 1,000 mg by mouth daily.     warfarin 7.5 MG tablet  Commonly known as:  COUMADIN  Take 1 tablet (7.5 mg total) by mouth one time only at 6 PM.        Disposition and follow-up:   Timothy Peck was discharged from [facility] in Stable condition.  At the hospital follow up visit please address:  1.  Medication compliance. (patient has a history of noncompliance)  2. Will likely need hypercoagulable workup in future.  3. Improvement of fine motor movement of left hand.  2.  Labs / imaging needed at time of follow-up: INR  3.  Pending labs/ test needing follow-up: None  Follow-up Appointments:     Follow-up Information   Follow up with Timothy Rigg, MD. Schedule an  appointment as soon as possible for a visit in 2 months. (Stroke Clinic)    Specialties:  Neurology, Radiology   Contact information:   [address] [place] [address] [phone]       Follow up with Timothy Rung, Peck On 10/06/2013. (at 10:15am)    Specialty:  Internal Medicine   Contact information:   [address] [place] [address] [phone]       Follow up with Timothy Peck, [facility] On 10/03/2013. (at 11:30am)    Specialty:  Pharmacist   Contact information:   [address] [place] [address] [phone]       Follow up with Timothy Peck, [facility] On 09/28/2013. (at [facility])    Specialty:  Pharmacist   Contact information:   [address] [place] [address] [phone]       Discharge Instructions: Discharge Orders   Future Appointments Provider Department Dept Phone   09/28/2013 10:15 AM [facility] Coumadin Clinic [facility] [phone]   10/03/2013 11:30 AM Imp-Imcr Coumadin Clinic [facility] [phone]   10/06/2013 10:15 AM Timothy Peck [facility] [phone]   Future Orders Complete By Expires  Diet - low sodium heart healthy  As directed    Discharge instructions  As directed    Comments:     Please start taking Warfarin 7.5 mg (1st pill to start tonight at 6pm) you will see Timothy Peck in the clinic tomorrow morning for further adjustment. Please start taking Aspirin 325mg  TOMORROW morning. STOP Taking Brilinta, Xarelto Please start to take the rest of your medications as prescribed tomorrow. I have make an appointment with you to follow up with myself in clinic on 10/06/13.  If you have any questions you can call the clinic and let them know you saw Timothy Peck inpatient and are establishing care.   Increase activity slowly  As directed       Consultations:    Procedures Performed:  Ct Angio Head W/cm &/or Wo Cm  09/21/2013   CLINICAL DATA:   Code stroke. "Fuzzy" left arm weakness and grip. Pain in right temple. Dizziness.  EXAM: CT ANGIOGRAPHY HEAD AND NECK  TECHNIQUE: Multidetector CT imaging of the head and neck was performed using the standard protocol during bolus administration of intravenous contrast. Multiplanar CT image reconstructions and MIPs were obtained to evaluate the vascular anatomy. Carotid stenosis measurements (when applicable) are obtained utilizing NASCET criteria, using the distal internal carotid diameter as the denominator. Please note, due to initial poor bolus timing, repeat angiography was performed.  CONTRAST:  OMNIPAQUE IOHEXOL 350 MG/ML SOLN  COMPARISON:  CT of the head   September 21, 2013 at 2229 hr  FINDINGS: Initial contrast bolus timing was suboptimal, however on the repeat examination with improved arterial opacification, there is marked motion degradation at the upper neck.  CTA HEAD FINDINGS  Anterior Circulation: The bilateral petrous, cavernous and supra clinoid internal carotid arteries are patent, there is mild narrowing of the anterior genu of the right internal carotid artery. Normal appearance of the anterior and middle cerebral arteries.  Posterior circulation: Right vertebral artery is dominant. Normal appearance of the vertebral arteries, basilar junction and basilar artery, including the branch vessels. Small right posterior communicating artery is present. Normal appearance of posterior cerebral arteries.  No large vessel occlusion, aneurysm, high-grade stenosis, suspicious luminal irregularity nor contrast extravasation.  Review of the MIP images confirms the above findings.  CTA NECK FINDINGS  Two vessel aortic arch appears normal. The origins of the bilateral common carotid arteries appear widely patent. Bilateral common carotid arteries course demonstrate line fashion, and are unremarkable though, evaluation is somewhat limited by patient motion. There is a eccentric intimal hematoma measuring  up to 6 mm in thickness at the origin of the right internal carotid artery, narrowing the lumen to 2.4 mm, the lumen above the stenosis is 4.8 mm. The hematoma begins at the origin, and extends approximately 25 mm cephalad. The origin of the left internal carotid artery is widely patent and unremarkable. The distal cervical internal carotid arteries are normal.  The origins of the bilateral vertebral arteries appear widely patent. The right vertebral artery is dominant. The vertebral arteries appear normal in course and caliber, unremarkable.  No pseudoaneurysm, large vessel occlusion, contrast extravasation or suspicious luminal irregularity within the posterior nor anterior circulation.  Review of the MIP images confirms the above findings.  IMPRESSION: CTA neck: Eccentric right internal carotid artery origin intimal hematoma resulting in 50% narrowing of the right ICA origin.  CTA head: No large vessel occlusion or hemodynamically significant stenosis.  If continued concern for acute ischemia, consider MRI of the brain.  Electronically Signed   By: Awilda Metro   On: 09/21/2013 23:40   Ct Head Wo Contrast  09/23/2013   CLINICAL DATA:  53 year old male with recent stroke. On heparin. Severe right side headache. Initial encounter.  EXAM: CT HEAD WITHOUT CONTRAST  TECHNIQUE: Contiguous axial images were obtained from the base of the skull through the vertex without intravenous contrast.  COMPARISON:  Head and neck CTA 09/21/2013 and earlier.  FINDINGS: Stable paranasal sinuses and mastoids. No acute osseous abnormality identified. No acute orbit or scalp soft tissue findings.  Multifocal patchy posterior right MCA hypodensity in a cytotoxic edema pattern. There is some lateral right occipital lobe involvement as well. These changes are new since 09/21/2013. No associated acute intracranial hemorrhage. No significant mass effect. Basilar cisterns remain patent.  No ventriculomegaly. Stable and normal  gray-Main matter differentiation in the left hemisphere and posterior fossa.  IMPRESSION: 1. Evolving patchy multifocal posterior right MCA territory infarct. 2. No associated mass effect or hemorrhage. 3. Stable brain elsewhere.   Electronically Signed   By: Augusto Gamble M.D.   On: 09/23/2013 09:11   Ct Head (brain) Wo Contrast  09/21/2013   CLINICAL DATA:  Could stroke, left-sided weakness  EXAM: CT HEAD WITHOUT CONTRAST  TECHNIQUE: Contiguous axial images were obtained from the base of the skull through the vertex without intravenous contrast.  COMPARISON:  None.  FINDINGS: No acute intracranial hemorrhage. No focal mass lesion. No CT evidence of acute infarction. No midline shift or mass effect. No hydrocephalus. Basilar cisterns are patent.  Paranasal sinuses and mastoid air cells are clear.  IMPRESSION: 1.  No acute intracranial findings. 2. No CT evidence of acute infarction. 3. No evidence of intracranial hemorrhage. Findings conveyed toDr. Gillermo Murdoch 09/21/2013  at22:37.   Electronically Signed   By: Genevive Bi M.D.   On: 09/21/2013 22:38   Ct Angio Neck W/cm &/or Wo/cm  09/21/2013   CLINICAL DATA:  Code stroke. "Fuzzy" left arm weakness and grip. Pain in right temple. Dizziness.  EXAM: CT ANGIOGRAPHY HEAD AND NECK  TECHNIQUE: Multidetector CT imaging of the head and neck was performed using the standard protocol during bolus administration of intravenous contrast. Multiplanar CT image reconstructions and MIPs were obtained to evaluate the vascular anatomy. Carotid stenosis measurements (when applicable) are obtained utilizing NASCET criteria, using the distal internal carotid diameter as the denominator. Please note, due to initial poor bolus timing, repeat angiography was performed.  CONTRAST:  OMNIPAQUE IOHEXOL 350 MG/ML SOLN  COMPARISON:  CT of the head   September 21, 2013 at 2229 hr  FINDINGS: Initial contrast bolus timing was suboptimal, however on the repeat examination with improved  arterial opacification, there is marked motion degradation at the upper neck.  CTA HEAD FINDINGS  Anterior Circulation: The bilateral petrous, cavernous and supra clinoid internal carotid arteries are patent, there is mild narrowing of the anterior genu of the right internal carotid artery. Normal appearance of the anterior and middle cerebral arteries.  Posterior circulation: Right vertebral artery is dominant. Normal appearance of the vertebral arteries, basilar junction and basilar artery, including the branch vessels. Small right posterior communicating artery is present. Normal appearance of posterior cerebral arteries.  No large vessel occlusion, aneurysm, high-grade stenosis, suspicious luminal irregularity nor contrast extravasation.  Review of the MIP images confirms the above findings.  CTA NECK FINDINGS  Two vessel aortic arch appears normal. The origins of the bilateral common carotid arteries appear widely patent. Bilateral common carotid arteries course demonstrate  line fashion, and are unremarkable though, evaluation is somewhat limited by patient motion. There is a eccentric intimal hematoma measuring up to 6 mm in thickness at the origin of the right internal carotid artery, narrowing the lumen to 2.4 mm, the lumen above the stenosis is 4.8 mm. The hematoma begins at the origin, and extends approximately 25 mm cephalad. The origin of the left internal carotid artery is widely patent and unremarkable. The distal cervical internal carotid arteries are normal.  The origins of the bilateral vertebral arteries appear widely patent. The right vertebral artery is dominant. The vertebral arteries appear normal in course and caliber, unremarkable.  No pseudoaneurysm, large vessel occlusion, contrast extravasation or suspicious luminal irregularity within the posterior nor anterior circulation.  Review of the MIP images confirms the above findings.  IMPRESSION: CTA neck: Eccentric right internal carotid  artery origin intimal hematoma resulting in 50% narrowing of the right ICA origin.  CTA head: No large vessel occlusion or hemodynamically significant stenosis.  If continued concern for acute ischemia, consider MRI of the brain.   Electronically Signed   By: Awilda Metro   On: 09/21/2013 23:40   Mr Brain Wo Contrast  09/23/2013   CLINICAL DATA:  Altered mental status and left-sided weakness.  EXAM: MRI HEAD WITHOUT CONTRAST  TECHNIQUE: Multiplanar, multiecho pulse sequences of the brain and surrounding structures were obtained without intravenous contrast.  COMPARISON:  Head CT 09/23/2013  FINDINGS: Multiple patchy areas of acute infarction are seen throughout the right MCA territory involving the frontal, parietal, occipital, and temporal lobes. Involvement is greatest in the parietal lobe. There is a small amount of associated petechial hemorrhage, most conspicuous in the parietal lobe. There is no midline shift or extra-axial fluid collection. Ventricles and sulci are within normal limits for age. Major intracranial vascular flow voids appear preserved. Orbits are unremarkable. Paranasal sinuses and mastoid air cells are clear.  IMPRESSION: Multiple areas of patchy, acute right MCA territory infarct with small amount of petechial hemorrhage.   Electronically Signed   By: Sebastian Ache   On: 09/23/2013 11:57   Carotid Doppler Arterial flow:  +------------+--------+-------+-------------+--------------+ Location V sys V ed Flow analysisComment  +------------+--------+-------+-------------+--------------+ Right CCA - 102cm/s 23cm/s -------------Minimal plaque proximal    noted.  +------------+--------+-------+-------------+--------------+ Right CCA - -111cm/s-23cm/s--------------------------- distal      +------------+--------+-------+-------------+--------------+ Right ECA -74cm/s  -12cm/s--------------------------- +------------+--------+-------+-------------+--------------+ Right ICA - -74cm/s -29cm/s-------------Moderate to  proximal    severe      homogeneous      plaque vs.      thrombus noted     in the      proximal ICA.  +------------+--------+-------+-------------+--------------+ Right ICA - -52cm/s -23cm/s--------------------------- distal      +------------+--------+-------+-------------+--------------+ Right -51cm/s -15cm/sAntegrade -------------- vertebral   flow   +------------+--------+-------+-------------+--------------+ Left CCA - 101cm/s 16cm/s -------------Mild plaque  proximal    noted.  +------------+--------+-------+-------------+--------------+ Left CCA - -105cm/s-31cm/s--------------------------- distal      +------------+--------+-------+-------------+--------------+ Left ECA -108cm/s-23cm/s--------------------------- +------------+--------+-------+-------------+--------------+ Left ICA - -62cm/s -18cm/s-------------Minimal plaque proximal    noted.  +------------+--------+-------+-------------+--------------+ Left ICA - -73cm/s -27cm/s--------------------------- distal      +------------+--------+-------+-------------+--------------+ Left -53cm/s -8cm/s Antegrade -------------- vertebral   flow   +------------+--------+-------+-------------+--------------+  ------------------------------------------------------------ Summary:  - Right: There appears to be moderate to severe homogeneous plaque vs. thrombus noted in the proximal ICA. Bilateral: 1-39% ICA stenosis. Vertebral artery flow is antegrade.  2D Echo: Study Conclusions  - Left ventricle: Technically limited study. The cavity size was normal. Wall thickness was increased in a pattern of mild  LVH. The estimated ejection fraction was  60%. Wall motion was normal; there were no regional wall motion abnormalities. - Impressions: No cardiac source of embolism was identified, but cannot be ruled out on the basis of this examination. Impressions:  - No cardiac source of embolism was identified, but cannot be ruled out on the basis of this examination.   Admission HPI: Burr MedicoByron G. Cliffton AstersWhite is a 53 year old male with past medical history of CAD s/p stent, MI in 2013, PE 03/2013 on xarelto, tobacco abuse, and medical noncompliance for at least 2 months who presents with acute onset left sided weakness and numbness.  Pt reports that earlier this afternoon he began having sharp and constant substernal chest pain with diaphoresis, dyspnea, and dizziness as he was walking to the bus stop. He then went home and tried to take a nap around 4:30 PM which helped relieve his CP however he then began having severe right sided posterior/temporal located headache with vertigo, nausea, slurred speech, blurry vision, confusion, left face/arm/leg numbness and tingling and weakness with resulting imbalance and incoordination. He denies dysphagia or diplopia.  He reports no prior history of stroke however mother died at age 51 of hemorraghic stroke and father also with stroke. He has a history of HL, CAD with stent, and tobacco abuse (10 cigarettes for 42 years). He reports prior drug use (marijuana, cocaine) however reports he quite about a year ago.  He has been out of all his medications including xarelto and brilinta due to inability to fill them for at least the past 2 months. He was on xarelto for PE in 03/2013 thought to be embolic from cardiac source. He denies recent aspirin use.  He reports his headache, dizziness (spinning sensation), blurry vision, left sided numbness/tingling and weakness are still present but chest pain has resolved. In the ED (presented around 10:30 PM), CT head w/o contrast did not reveal acute abnormality. He did not receive tPA  since he was outside of the time frame.    Hospital Course by problem list:    Acute ischemic stroke secondary to right ICA thrombus Patient presented with a right sided headache, left side numbness and weakness.  A code stroke was called in the ED.  Initial CT of the head was negative for acute abnormality.  A CTA of the neck revealed a right internal carotid artery hematoma resulting in 50% narrowing of the right ICA.  He was treated with IV heparin due to his presumed embolic stroke.  A MRI was obtained under sedation due to claustrophobia which confirmed multiple areas of patchy infarct in the right MCA territory.  Warfarin therapy was initiated.  He was evaluated and treated by physical therapy and occupational therapy.  He had great improvement with his strength while inpatient and on discharge he only had mild fine movement deficits.  He will continue to work with occupational therapy as outpatient.  On hospital day 6 he was not yet therapeutic with his INR, he wished to be discharged.  After speaking with neurology his antiplatelet therapy was changed from Brilinta to Aspirin and heparin was discontinued.  [facility] as well as on 10/03/13 for further titration of his warfarin therapy.        CAD S/P percutaneous coronary angioplasty 02/21/12  - ACS was ruled out by cycling cardiac enzymes.  He was continued on his Brilinta therapy during his stay.  His BP medications were initially  held to allow for permissive HTN in the setting of acute stroke.  On discharge his antiplatelet therapy was changed from Brilinta to aspirin as this could be used for bridging as his INR was sub theraputic.  In addition he has had trouble affording his medications and this will be more affordable.    Hx Pulmonary embolism   -Patient was noncompliant with Xarelto.  Given he has know had thrombosis of both the arterial and venous systems he needs a  hypercoagulable workup in the future (not in acute setting).  He was anticoagulated with heparin while inpatient and started to bridge to warfarin.  He was instructed to continue warfarin and will follow up at the [facility].  Discharge Vitals:   BP 122/78  Pulse 70  Temp(Src) 97.7 F (36.5 C) (Oral)  Resp 18  Ht 6' 4.5" (1.943 m)  Wt 191 lb (86.637 kg)  BMI 22.95 kg/m2  SpO2 100%  Discharge Labs:  Results for orders placed during the hospital encounter of 09/21/13 (from the past 24 hour(s))  CBC     Status: Abnormal   Collection Time    09/27/13  3:51 AM      Result Value Ref Range   WBC 9.1  4.0 - 10.5 K/uL   RBC 4.18 (*) 4.22 - 5.81 MIL/uL   Hemoglobin 13.3  13.0 - 17.0 g/dL   HCT 60.4 (*) 54.0 - 98.1 %   MCV 90.7  78.0 - 100.0 fL   MCH 31.8  26.0 - 34.0 pg   MCHC 35.1  30.0 - 36.0 g/dL   RDW 19.1  47.8 - 29.5 %   Platelets 226  150 - 400 K/uL  HEPARIN LEVEL (UNFRACTIONATED)     Status: None   Collection Time    09/27/13  3:51 AM      Result Value Ref Range   Heparin Unfractionated 0.37  0.30 - 0.70 IU/mL  PROTIME-INR     Status: Abnormal   Collection Time    09/27/13  3:51 AM      Result Value Ref Range   Prothrombin Time 19.0 (*) 11.6 - 15.2 seconds   INR 1.64 (*) 0.00 - 1.49    Signed: Carlynn Purl, Peck 09/27/2013, 11:54 AM   Time Spent on Discharge: 20 minutes Services Ordered on Discharge: OT Equipment Ordered on Discharge: none

## 2013-09-27 NOTE — Progress Notes (Signed)
Pt d/c to home by bus. Assessment stable. Prescriptions given. Pt verbalizes understanding of d/c instructions.

## 2013-09-27 NOTE — Progress Notes (Signed)
Occupational Therapy Treatment Patient Details Name: Timothy BosworthByron G Peck MRN: [id number] DOB: 10/23/1960 Today's Date: 09/27/2013 Time: [id number] OT Time Calculation (min): 17 min  OT Assessment / Plan / Recommendation  History of present illness 53 y.o. admitted with left sided weakness and numbness. MRI of the brain showed multiple areas of patchy, acute right MCA territory infarct with small amount of petechial hemorrhage.   OT comments  Worked on vision activities and spoke with case manager to be sure pt was set up with Outpatient OT. Recommended No driving and to get a full visual field assessment.   Follow Up Recommendations  Outpatient OT;Supervision - Intermittent    Barriers to Discharge       Equipment Recommendations  None recommended by OT    Recommendations for Other Services    Frequency Min 2X/week   Progress towards OT Goals Progress towards OT goals: Progressing toward goals  Plan Discharge plan remains appropriate    Precautions / Restrictions Restrictions Weight Bearing Restrictions: No   Pertinent Vitals/Pain No pain reported.     ADL  Toilet Transfer: Independent StatisticianToilet Transfer Method: Sit to [place] Toilet Transfer Equipment:  (chair) Equipment Used: Other (comment) (squeeze ball) Transfers/Ambulation Related to ADLs: Supervision ADL Comments: Session focused on activities to address vision. Pt worked on finding letters on sheet as well as locating item in newspaper. Pt initially missing several letters on left side of sheet. Pt missing several letters, in general, with cues to keep looking. Pt worked on finding items in snack machine and took increased time to locate item in left upper corner. Recommended NO driving until pt gets full visual field assessment.  Recommended no riding his bike right now. Performed ball toss with squeeze ball while counting.   OT Diagnosis:    OT Problem List:   OT Treatment Interventions:     OT Goals(current goals can now be  found in the care plan section) Acute Rehab OT Goals Patient Stated Goal: go home OT Goal Formulation: With patient Time For Goal Achievement: 09/29/13 Potential to Achieve Goals: Good ADL Goals Pt Will Perform Upper Body Dressing: with modified independence;sitting Pt Will Perform Lower Body Dressing: with modified independence;sit to/from stand Pt Will Transfer to Toilet: with modified independence;ambulating;regular height toilet Pt Will Perform Toileting - Clothing Manipulation and hygiene: with modified independence;sit to/from stand Pt Will Perform Tub/Shower Transfer: Tub transfer;with supervision;ambulating Additional ADL Goal #1: pt will independently participate in vision exercises/activities.  Visit Information  Last OT Received On: 09/27/13 Assistance Needed: +1 History of Present Illness: 53 y.o. admitted with left sided weakness and numbness. MRI of the brain showed multiple areas of patchy, acute right MCA territory infarct with small amount of petechial hemorrhage.    Subjective Data      Prior Functioning       Cognition  Cognition Arousal/Alertness: Awake/alert Behavior During Therapy: WFL for tasks assessed/performed Overall Cognitive Status: Within Functional Limits for tasks assessed    Mobility  Transfers Overall transfer level: Independent    Exercises      Balance    End of Session OT - End of Session Activity Tolerance: Patient tolerated treatment well Patient left: with nursing/sitter in room (up in room) Nurse Communication: Other (comment) (needs outpatient OT)  GO     Earlie RavelingStraub, Dwana Garin L OTR/L [phone] 09/27/2013, 1:29 PM

## 2013-09-27 NOTE — Progress Notes (Signed)
INTERNAL MEDICINE TEACHING SERVICE DAILY PROGRESS NOTE  Subjective: No acute complaints, wants to go home today.  Reports he was told by pharmacy that he can do lovenox injections at home until he is therapeutic on warfarin. Objective: Vital signs in last 24 hours: Filed Vitals:   09/26/13 2154 09/27/13 0218 09/27/13 0608 09/27/13 0811  BP: 143/75 149/52 145/81   Pulse: 54 56 68   Temp: 98.1 F (36.7 C) 98.1 F (36.7 C) 97.1 F (36.2 C)   TempSrc: Oral Oral Oral   Resp: 18 20 20    Height:      Weight:      SpO2: 99% 93% 94% 98%   Weight change:   Intake/Output Summary (Last 24 hours) at 09/27/13 0851 Last data filed at 09/27/13 0600  Gross per 24 hour  Intake    250 ml  Output    200 ml  Net     50 ml   General: resting in bed, no acute distress HEENT: PERRL, EOMI, no scleral icterus. Cardiac: RRR, no rubs, murmurs or gallops Pulm: clear to auscultation bilaterally, moving normal volumes of air Abd: soft, nontender, nondistended, BS present Ext: warm and well perfused, no pedal edema Neuro: alert and oriented X3. 5/5 gross motor strength of upper and lower extremities, no appreciated difference in grip strength between right and left hand.  No cranial nerve deficit appreciated.  Lab Results: Basic Metabolic Panel:  Recent Labs Lab 09/22/13 0540 09/24/13 0415  NA 139 142  K 3.9 4.1  CL 105 106  CO2 22 23  GLUCOSE 108* 106*  BUN 16 11  CREATININE 1.34 1.29  CALCIUM 8.4 8.5   Liver Function Tests:  Recent Labs Lab 09/21/13 2222  AST 25  ALT 30  ALKPHOS 46  BILITOT 0.3  PROT 6.1  ALBUMIN 3.5   CBC:  Recent Labs Lab 09/21/13 2222  09/26/13 0530 09/27/13 0351  WBC 14.2*  < > 7.6 9.1  NEUTROABS 11.5*  --   --   --   HGB 17.0  < > 14.0 13.3  HCT 46.9  < > 39.8 37.9*  MCV 89.5  < > 90.2 90.7  PLT 299  < > 234 226  < > = values in this interval not displayed. Cardiac Enzymes:  Recent Labs Lab 09/21/13 2222 09/22/13 0540 09/22/13 0744   TROPONINI <0.30 <0.30 <0.30   CBG:  Recent Labs Lab 09/21/13 2235  GLUCAP 155*   Hemoglobin A1C:  Recent Labs Lab 09/22/13 0540  HGBA1C 5.6   Fasting Lipid Panel:  Recent Labs Lab 09/22/13 0540  CHOL 154  HDL 43  LDLCALC 92  TRIG 93  CHOLHDL 3.6   Thyroid Function Tests:  Recent Labs Lab 09/22/13 0540  TSH 0.645   Coagulation:  Recent Labs Lab 09/24/13 0415 09/25/13 0450 09/26/13 0530 09/27/13 0351  LABPROT 13.4 13.9 16.3* 19.0*  INR 1.04 1.09 1.34 1.64*    Urine Drug Screen: Drugs of Abuse     Component Value Date/Time   LABOPIA NONE DETECTED 09/22/2013 0035   COCAINSCRNUR NONE DETECTED 09/22/2013 0035   LABBENZ NONE DETECTED 09/22/2013 0035   AMPHETMU NONE DETECTED 09/22/2013 0035   THCU POSITIVE* 09/22/2013 0035   LABBARB NONE DETECTED 09/22/2013 0035     Studies/Results: No results found. Medications: I have reviewed the patient's current medications. Scheduled Meds: . atorvastatin  80 mg Oral q1800  . clopidogrel  75 mg Oral Q breakfast  . lisinopril  5 mg Oral Daily  .  mometasone-formoterol  2 puff Inhalation BID  . multivitamin with minerals  1 tablet Oral Daily  . sodium chloride  3 mL Intravenous Q12H  . Warfarin - Pharmacist Dosing Inpatient   Does not apply q1800   Continuous Infusions: . 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 Neuro following. On Brilinta prior to admission (non compliant with Xarelto prior to admission). Now on heparin and warfarin for secondary stroke prevention. Plan - will transition to warfarin to treat thrombus, prevent further embolization, INR remains subtheraputic, will ask Neurology about potential for lovenox at home. -Continue Brilinta will  change to Aspirin (given 1 dose of clopidogrel this AM), I spoke with Dr Pearlean Brownie and patient can start Aspirin while he is bridging to Warfarin if he wishes to go home, who not start patient on Lovenox -Continue atorvastatin - will eventually need hypercoagulability workup given arterial and venous thrombosis (cannot due in acute setting/on anticoagulant medications)  # CAD S/P percutaneous coronary angioplasty 02/21/12 non-STEMI -Promus DES 4.0 mm 20 mm to prox LAD - Change brilinta to ASA - Restart Lisinopril 5mg  - Patient mildly bradycardiac will hold off on restarting metoprolol. (can restart low dose on discharge)    Hx Pulmonary embolism  - Previously non compliant with Xarelto, will need treatment for ICA thrombus with warfarin. -  Needs hypercoagulability workup in future.  CKD stage II -No acute issues at this time.  Dispo: Will discharge home after follow up with out clinic and Coumadin clinic is established.  The patient does not have a current PCP (Pcp Not In System) and does need an [facility] hospital follow-up appointment after discharge.  The patient does not know have transportation limitations that hinder transportation to clinic appointments.  .Services Needed at time of discharge: Y = Yes, Blank = No PT: y  OT:   RN:   Equipment:   Other:     LOS: 6 days   Carlynn Purl, DO 09/27/2013, 8:51 AM

## 2013-09-27 NOTE — Progress Notes (Addendum)
ANTICOAGULATION CONSULT NOTE - Follow Up Consult  Pharmacy Consult for Heparin and Coumadin Indication: stroke, proximal right ICA thrombus  Allergies  Allergen Reactions  . Ibuprofen Other (See Comments)    Doctor told him he could not take   Patient Measurements: Height: 6' 4.5" (194.3 cm) Weight: 191 lb (86.637 kg) IBW/kg (Calculated) : 87.95 Heparin Dosing Weight: 87 kg Vital Signs: Temp: 97.7 F (36.5 C) (02/24 0941) Temp src: Oral (02/24 0941) BP: 122/78 mmHg (02/24 0941) Pulse Rate: 70 (02/24 0941) Labs:  Recent Labs  09/25/13 0450 09/25/13 1650 09/26/13 0530 09/27/13 0351  HGB 13.7  --  14.0 13.3  HCT 38.9*  --  39.8 37.9*  PLT 244  --  234 226  LABPROT 13.9  --  16.3* 19.0*  INR 1.09  --  1.34 1.64*  HEPARINUNFRC 0.58 0.35 0.36 0.37   Estimated Creatinine Clearance: 83.9 ml/min (by C-G formula based on Cr of 1.29).  Assessment: 53yo male with new embolic stroke after Xarelto noncompliance.   AC - new ischemic CVA due to R ICA thrombus on IV hepairn and warfarin. Hx PE 03/2013. Heparin was dc'd by Dr. Roseanne RenoStewart d/t intimal hematoma on CT, however MRI negative for hemorrhagic conversion. Heparin resumed. HL therapeutic on 1100units/hr. INR rising appropriately on warfarin but still sub-therapeutic.   *Patient asked about Lovenox to go home-I told to discuss with MD. Per progress notes, Internal Medicine talking to Neurology.   Expect to be in therapeutic range 2/25- recommend overlap for 2 days once therapeutic. Concerned for compliance going home.   Goal of Therapy:  INR 2-3 Heparin level 0.3-0.5 units/ml, lower end of therapeutic range Monitor platelets by anticoagulation protocol: Yes   Plan:   - Continue heparin to 1100units/hr  - daily HL and CBC - warfarin 7.5mg  po x1 again tonight  - daily PT/INR  Link SnufferJessica Connery Shiffler, PharmD, BCPS Clinical Pharmacist [phone]  09/27/2013 9:55 AM

## 2013-09-28 ENCOUNTER — Ambulatory Visit: Payer: Medicaid Other

## 2013-10-03 ENCOUNTER — Ambulatory Visit: Payer: Medicaid Other

## 2013-10-03 NOTE — Discharge Summary (Signed)
I agree with summary as outlined. Functional status improved and will get close follow up.

## 2013-10-06 ENCOUNTER — Ambulatory Visit: Payer: Medicaid Other | Admitting: Internal Medicine

## 2013-10-10 ENCOUNTER — Ambulatory Visit (INDEPENDENT_AMBULATORY_CARE_PROVIDER_SITE_OTHER): Payer: Medicaid Other | Admitting: Internal Medicine

## 2013-10-10 ENCOUNTER — Ambulatory Visit: Payer: Medicaid Other

## 2013-10-10 ENCOUNTER — Ambulatory Visit: Payer: Medicaid Other | Admitting: Internal Medicine

## 2013-10-10 ENCOUNTER — Ambulatory Visit (INDEPENDENT_AMBULATORY_CARE_PROVIDER_SITE_OTHER): Payer: Medicaid Other | Admitting: Pharmacist

## 2013-10-10 VITALS — BP 117/81 | HR 50 | Temp 97.8°F | Ht 76.0 in | Wt 196.6 lb

## 2013-10-10 DIAGNOSIS — I6350 Cerebral infarction due to unspecified occlusion or stenosis of unspecified cerebral artery: Secondary | ICD-10-CM

## 2013-10-10 DIAGNOSIS — Z7901 Long term (current) use of anticoagulants: Secondary | ICD-10-CM

## 2013-10-10 DIAGNOSIS — I2699 Other pulmonary embolism without acute cor pulmonale: Secondary | ICD-10-CM

## 2013-10-11 DIAGNOSIS — Z7901 Long term (current) use of anticoagulants: Secondary | ICD-10-CM | POA: Insufficient documentation

## 2013-10-11 NOTE — Progress Notes (Signed)
   Subjective:    Patient ID: Timothy Peck, male    DOB: 06/13/1961, 53 y.o.   MRN: [id number]  HPI  Pt declined to be examed today. Prefers to wait and be seen by Dr. Mikey BussingHoffman.  Of note, he has been taking 4 antiplatelet therapies since hosp d/c namely Brilinta and Xarelto which were refilled by Cardiology after he called for refill and Warfarin and aspirin recommended by Neurology. Pt counseled by Dr. Alexandria LodgeGroce in clinic. Recommend d/c Brilinta and Xarelto.  Review of Systems     Objective:   Physical Exam        Assessment & Plan:

## 2013-10-11 NOTE — Progress Notes (Signed)
Anti-Coagulation Progress Note  Timothy Peck is a 53 y.o. male who is currently on an anti-coagulation regimen.    RECENT RESULTS: Recent results are below, the most recent result is correlated with a dose of 7.5mg  per day of warfarin. Additionally, patient has been taking 20mg  once-daily rivaroxaban/Xarelto (which he had been on for a PE prior to this hospitalization). He had been taking Brilenta as well status-poste cardiac catheterization with stenting. On 23-FEB he was upon his request of his cardiologist (after review of phone notes/documentation) provided a refill on his Xarelto. The patient was under the impression he was to continue taking this. Discharge summary by Dr. Mikey BussingHoffman indicates upon discharge that the patient was supposed to be on ASA and warfarin/Coumadin--for his right internal carotid artery stenosis. With warfarin--he will now be covered for his venous embolic disease and with the ASA he will be covered for his arterial/platelet rich embolic disease. Rivaroxaban/Xarelto has not been studied nor is it approved for the most recent embolic event suffered by the patient (right internal carotid artery clot). This explains the clinical decision to discontinue Xarelto and commence the patient upon warfarin. Esperanza HeirBrilenta was discontinued because of its prohibitive cost for this patient. Patient was to have seen Dr. Bosie ClosSchooler today in follow up---having missed earlier appointments due to snow/ice. The patient elects instead to leave the clinic and schedule a follow up appointment with Dr. Mikey BussingHoffman upon his return from time out of hospital. Patient was provided warfarin dosing instructions (1 tab [7.5mg ] alternating with 1/2 tablet every other day until [facility] this Friday 13[facility].  Lab Results  Component Value Date   INR 1.64* 09/27/2013   INR 1.34 09/26/2013   INR 1.09 09/25/2013    ANTI-COAG DOSE: Anticoagulation Dose Instructions as of 10/10/2013     Glynis SmilesSun Mon Tue Wed Thu Fri Sat   New Dose  0 mg 7.5 mg 3.75 mg 7.5 mg 3.75 mg 0 mg 0 mg    Description       Take 1 tablet M/W; 1/2 tablet on Tues/Thurs. INR check on Friday 13-MAR-15.       ANTICOAG SUMMARY: Anticoagulation Episode Summary   Current INR goal 2.0-3.0  Next INR check   INR from last check   Most recent INR 1.64! (09/27/2013)  Weekly max dose   Target end date Indefinite  INR check location [facility]  Preferred lab   Send INR reminders to    Indications  Pulmonary embolism and infarction 03/13/13 on Xarelto [415.19] Long term (current) use of anticoagulants [V58.61]        Comments Recent hospitalization with right internal carotid artery stenosis. Had been on rivaroxaban. Must now d/c rivaroxaban as it has not been studied in the setting of this presentation to hospital.         ANTICOAG TODAY: Anticoagulation Summary as of 10/10/2013   INR goal 2.0-3.0  Selected INR   Next INR check   Target end date Indefinite   Indications  Pulmonary embolism and infarction 03/13/13 on Xarelto [415.19] Long term (current) use of anticoagulants [V58.61]      Anticoagulation Episode Summary   INR check location [facility]   Preferred lab    Send INR reminders to    Comments Recent hospitalization with right internal carotid artery stenosis. Had been on rivaroxaban. Must now d/c rivaroxaban as it has not been studied in the setting of this presentation to hospital.       PATIENT INSTRUCTIONS: Patient Instructions  Patient  instructed to take medications as defined in the Anti-coagulation Track section of this encounter.  Patient instructed to take today's dose.  Patient instructed to DISCONTINUE concomitant use of Xarelto and Brilenta. Patient verbalized understanding of these instructions.       FOLLOW-UP Return in 4 days (on 10/14/2013) for Follow up INR at 2PM.  Hulen Luster, III Pharm.D., CACP

## 2013-10-11 NOTE — Patient Instructions (Signed)
Patient instructed to take medications as defined in the Anti-coagulation Track section of this encounter.  Patient instructed to take today's dose.  Patient instructed to DISCONTINUE concomitant use of Xarelto and Brilenta. Patient verbalized understanding of these instructions.

## 2013-10-14 ENCOUNTER — Other Ambulatory Visit: Payer: Medicaid Other

## 2013-10-18 ENCOUNTER — Ambulatory Visit: Payer: Medicaid Other | Admitting: Internal Medicine

## 2013-10-20 ENCOUNTER — Other Ambulatory Visit: Payer: Self-pay

## 2013-10-20 ENCOUNTER — Emergency Department (HOSPITAL_COMMUNITY)
Admission: EM | Admit: 2013-10-20 | Discharge: 2013-10-20 | Disposition: A | Discharge status: Home / Self Care | Payer: Medicaid Other | Attending: Emergency Medicine | Admitting: Emergency Medicine

## 2013-10-20 ENCOUNTER — Encounter (HOSPITAL_COMMUNITY): Payer: Self-pay | Admitting: Emergency Medicine

## 2013-10-20 DIAGNOSIS — I2510 Atherosclerotic heart disease of native coronary artery without angina pectoris: Secondary | ICD-10-CM | POA: Insufficient documentation

## 2013-10-20 DIAGNOSIS — Z8673 Personal history of transient ischemic attack (TIA), and cerebral infarction without residual deficits: Secondary | ICD-10-CM | POA: Insufficient documentation

## 2013-10-20 DIAGNOSIS — E861 Hypovolemia: Secondary | ICD-10-CM

## 2013-10-20 DIAGNOSIS — R791 Abnormal coagulation profile: Secondary | ICD-10-CM

## 2013-10-20 DIAGNOSIS — I252 Old myocardial infarction: Secondary | ICD-10-CM | POA: Insufficient documentation

## 2013-10-20 DIAGNOSIS — F17200 Nicotine dependence, unspecified, uncomplicated: Secondary | ICD-10-CM | POA: Insufficient documentation

## 2013-10-20 DIAGNOSIS — IMO0002 Reserved for concepts with insufficient information to code with codable children: Secondary | ICD-10-CM | POA: Insufficient documentation

## 2013-10-20 DIAGNOSIS — Z7982 Long term (current) use of aspirin: Secondary | ICD-10-CM | POA: Insufficient documentation

## 2013-10-20 DIAGNOSIS — Z87828 Personal history of other (healed) physical injury and trauma: Secondary | ICD-10-CM | POA: Insufficient documentation

## 2013-10-20 DIAGNOSIS — R42 Dizziness and giddiness: Secondary | ICD-10-CM | POA: Insufficient documentation

## 2013-10-20 DIAGNOSIS — Z79899 Other long term (current) drug therapy: Secondary | ICD-10-CM | POA: Insufficient documentation

## 2013-10-20 DIAGNOSIS — Z7901 Long term (current) use of anticoagulants: Secondary | ICD-10-CM | POA: Insufficient documentation

## 2013-10-20 DIAGNOSIS — Z8659 Personal history of other mental and behavioral disorders: Secondary | ICD-10-CM | POA: Insufficient documentation

## 2013-10-20 DIAGNOSIS — Z9889 Other specified postprocedural states: Secondary | ICD-10-CM | POA: Insufficient documentation

## 2013-10-20 DIAGNOSIS — R61 Generalized hyperhidrosis: Secondary | ICD-10-CM | POA: Insufficient documentation

## 2013-10-20 DIAGNOSIS — M5030 Other cervical disc degeneration, unspecified cervical region: Secondary | ICD-10-CM | POA: Insufficient documentation

## 2013-10-20 LAB — I-STAT CHEM 8, ED
BUN: 16 mg/dL (ref 6–23)
Calcium, Ion: 1.16 mmol/L (ref 1.12–1.23)
Chloride: 103 meq/L (ref 96–112)
Creatinine, Ser: 1.6 mg/dL — ABNORMAL HIGH (ref 0.50–1.35)
Glucose, Bld: 79 mg/dL (ref 70–99)
HCT: 55 % — ABNORMAL HIGH (ref 39.0–52.0)
Hemoglobin: 18.7 g/dL — ABNORMAL HIGH (ref 13.0–17.0)
Potassium: 4.2 meq/L (ref 3.7–5.3)
Sodium: 140 mEq/L (ref 137–147)
TCO2: 27 mmol/L (ref 0–100)

## 2013-10-20 LAB — I-STAT TROPONIN, ED: Troponin i, poc: 0.01 ng/mL (ref 0.00–0.08)

## 2013-10-20 LAB — PROTIME-INR
INR: 1.07 (ref 0.00–1.49)
Prothrombin Time: 13.7 seconds (ref 11.6–15.2)

## 2013-10-20 MED ORDER — SODIUM CHLORIDE 0.9 % IV BOLUS (SEPSIS)
500.0000 mL | Freq: Once | INTRAVENOUS | Status: AC
  Administered 2013-10-20: 500 mL via INTRAVENOUS

## 2013-10-20 NOTE — Discharge Instructions (Signed)
Dehydration, Adult Dehydration is when you lose more fluids from the body than you take in. Vital organs like the kidneys, brain, and heart cannot function without a proper amount of fluids and salt. Any loss of fluids from the body can cause dehydration.  CAUSES   Vomiting.  Diarrhea.  Excessive sweating.  Excessive urine output.  Fever. SYMPTOMS  Mild dehydration  Thirst.  Dry lips.  Slightly dry mouth. Moderate dehydration  Very dry mouth.  Sunken eyes.  Skin does not bounce back quickly when lightly pinched and released.  Dark urine and decreased urine production.  Decreased tear production.  Headache. Severe dehydration  Very dry mouth.  Extreme thirst.  Rapid, weak pulse (more than 100 beats per minute at rest).  Cold hands and feet.  Not able to sweat in spite of heat and temperature.  Rapid breathing.  Blue lips.  Confusion and lethargy.  Difficulty being awakened.  Minimal urine production.  No tears. DIAGNOSIS  Your caregiver will diagnose dehydration based on your symptoms and your exam. Blood and urine tests will help confirm the diagnosis. The diagnostic evaluation should also identify the cause of dehydration. TREATMENT  Treatment of mild or moderate dehydration can often be done at home by increasing the amount of fluids that you drink. It is best to drink small amounts of fluid more often. Drinking too much at one time can make vomiting worse. Refer to the home care instructions below. Severe dehydration needs to be treated at the hospital where you will probably be given intravenous (IV) fluids that contain water and electrolytes. HOME CARE INSTRUCTIONS   Ask your caregiver about specific rehydration instructions.  Drink enough fluids to keep your urine clear or pale yellow.  Drink small amounts frequently if you have nausea and vomiting.  Eat as you normally do.  Avoid:  Foods or drinks high in sugar.  Carbonated  drinks.  Juice.  Extremely hot or cold fluids.  Drinks with caffeine.  Fatty, greasy foods.  Alcohol.  Tobacco.  Overeating.  Gelatin desserts.  Wash your hands well to avoid spreading bacteria and viruses.  Only take over-the-counter or prescription medicines for pain, discomfort, or fever as directed by your caregiver.  Ask your caregiver if you should continue all prescribed and over-the-counter medicines.  Keep all follow-up appointments with your caregiver. SEEK MEDICAL CARE IF:  You have abdominal pain and it increases or stays in one area (localizes).  You have a rash, stiff neck, or severe headache.  You are irritable, sleepy, or difficult to awaken.  You are weak, dizzy, or extremely thirsty. SEEK IMMEDIATE MEDICAL CARE IF:   You are unable to keep fluids down or you get worse despite treatment.  You have frequent episodes of vomiting or diarrhea.  You have blood or green matter (bile) in your vomit.  You have blood in your stool or your stool looks black and tarry.  You have not urinated in 6 to 8 hours, or you have only urinated a small amount of very dark urine.  You have a fever.  You faint. MAKE SURE YOU:   Understand these instructions.  Will watch your condition.  Will get help right away if you are not doing well or get worse. Document Released: 07/21/2005 Document Revised: 10/13/2011 Document Reviewed: 03/10/2011 [facility] Patient Information 2014 [place], [place].     It is important not to get dehydrated with your recent history of stroke.  In addition, your INR test for coumadin was very low  today and it is very important to discuss with your doctor about your daily coumadin dosage to be adjusted as soon as possible.  I would recommend that you do not donate plasma until your are cleared by your physician to do so.

## 2013-10-20 NOTE — ED Notes (Signed)
Per EMS pt from home, c/o dizziness, generalized weakness. Donated plasma today, took the bus home and felt at baseline post-plasma. Pt had CVA 2 weeks today, pt sts since has memory difficutlies. Pt did not eat breakfast and had small amount of food after donating. At home pt BP 78/58 Pt given 100cc of NS. Last BP 115/70. Pt in NAD at present time.

## 2013-10-20 NOTE — ED Notes (Signed)
Pt called out to state needs to leave as soon as possible to "catch the bus".

## 2013-10-20 NOTE — ED Notes (Signed)
Patient ambulated in hallways- steady gait denies dizziness

## 2013-10-20 NOTE — ED Notes (Signed)
Dr. Oletta LamasGhim made aware of low BP- sts give 1,08200ml bolus to pt.

## 2013-10-20 NOTE — ED Provider Notes (Signed)
CSN: [id number]     Arrival date & time 10/20/13  1732 History   First MD Initiated Contact with Patient 10/20/13 1737     Chief Complaint  Patient presents with  . Fatigue     (Consider location/radiation/quality/duration/timing/severity/associated sxs/prior Treatment) HPI Comments: Pt had a stroke 2-3 weeks ago, spent 1 week in the hospital.  He is on coumadin and aspirin currently along with BP control.  He has given plasma in the past, went to gie plasma again today to earn some money, didn't tell them that he had had a recent stroke.  Pt felt ok immediately after, but at home got weak, felt faint, dizzy.  No CP, SOB, abd pain, back pain.  Pt strongly endorses that he doesn't wish to stay here.  He reports he msised his last appt because he didn't have money and had no transportation.  Pt also reports has not eaten breakfast or lunch because he went to donate.  Patient is a 53 y.o. male presenting with weakness. The history is provided by the patient, the EMS personnel and medical records.  Weakness This is a new problem. Pertinent negatives include no chest pain, no abdominal pain and no shortness of breath.    Past Medical History  Diagnosis Date  . Depression   . Degenerative disc disease, cervical     Dx years ago  . CAD (coronary artery disease) 02/2012    NSTEMI - PCI to prox LAD  . Presence of drug coated stent in LAD coronary artery 02/2012    Promus Element 4.0 mm x 28 mm  . MI (myocardial infarction) 02/02/2012    Normal 2D Echo - EF-60  . Hematoma, s/p cath -rt wrist 12/22/2012  . Chest pain, stable cardiac cath 12/2012 12/22/2012  . NSTEMI (non-ST elevated myocardial infarction), 07/18/13, stable cath with patent stent. 01/31/2012  . Chronic anticoagulation, since 03/2013 for PE 07/19/2013  . Stroke    Past Surgical History  Procedure Laterality Date  . Tumor removal      left foot third toe  . Anterior cervical decomp/discectomy fusion  11/28/2011    Procedure: ANTERIOR  CERVICAL DECOMPRESSION/DISCECTOMY FUSION 1 LEVEL;  Surgeon: Temple Pacini, MD;  Location: [facility];  Service: Neurosurgery;  Laterality: N/A;  Anterior Cervical Six-Seven Decompression and Fusion  . Cardiac catheterization  01/31/2012    Promeus Element DES 4 x 28, balloon-Emerge monorail  . Cardiac catheterization  12/17/12    patent stent, stable CAD   Family History  Problem Relation Age of Onset  . Anesthesia problems Neg Hx   . Hypotension Neg Hx   . Malignant hyperthermia Neg Hx   . Pseudochol deficiency Neg Hx   . Coronary artery disease Father   . Aneurysm Mother 45    intracranial aneurysm rupture  . Stroke Father 5   History  Substance Use Topics  . Smoking status: Current Every Day Smoker -- 0.50 packs/day for 42 years    Types: Cigarettes    Start date: 01/12/1971  . Smokeless tobacco: Never Used  . Alcohol Use: No     Comment: Report being abstinent since ~January 2015    Review of Systems  Constitutional: Positive for diaphoresis. Negative for fever.  Respiratory: Negative for shortness of breath.   Cardiovascular: Negative for chest pain.  Gastrointestinal: Negative for nausea, vomiting, abdominal pain and blood in stool.  Musculoskeletal: Negative for back pain.  Neurological: Positive for dizziness, weakness and light-headedness. Negative for syncope.  All other  systems reviewed and are negative.      Allergies  Ibuprofen  Home Medications   Current Outpatient Rx  Name  Route  Sig  Dispense  Refill  . Ascorbic Acid (VITAMIN C) 1000 MG tablet   Oral   Take 1,000 mg by mouth daily.         Marland Kitchen. aspirin EC 325 MG tablet   Oral   Take 1 tablet (325 mg total) by mouth daily.   30 tablet   0   . aspirin EC 81 MG tablet   Oral   Take 81 mg by mouth daily.         Marland Kitchen. atorvastatin (LIPITOR) 80 MG tablet   Oral   Take 1 tablet (80 mg total) by mouth daily at 6 PM.   30 tablet   0   . lisinopril (PRINIVIL,ZESTRIL) 5 MG tablet   Oral   Take  1 tablet (5 mg total) by mouth daily.   30 tablet   0   . metoprolol tartrate (LOPRESSOR) 12.5 mg TABS tablet   Oral   Take 0.5 tablets (12.5 mg total) by mouth 2 (two) times daily.   30 tablet   0   . mometasone-formoterol (DULERA) 100-5 MCG/ACT AERO   Inhalation   Inhale 2 puffs into the lungs 2 (two) times daily.   1 Inhaler   0   . warfarin (COUMADIN) 7.5 MG tablet   Oral   Take 1 tablet (7.5 mg total) by mouth one time only at 6 PM.   20 tablet   0    BP 107/71  Pulse 64  Temp(Src) 97.7 F (36.5 C) (Oral)  Resp 18  Ht 6\' 4"  (1.93 m)  Wt 200 lb (90.719 kg)  BMI 24.35 kg/m2  SpO2 100% Physical Exam  Nursing note and vitals reviewed. Constitutional: He is oriented to person, place, and time. He appears well-developed and well-nourished.  Clammy and cool skin  HENT:  Head: Normocephalic and atraumatic.  Eyes: Conjunctivae are normal. No scleral icterus.  Neck: Normal range of motion. Neck supple.  Cardiovascular: Normal rate, regular rhythm and intact distal pulses.   No murmur heard. Pulmonary/Chest: Effort normal. No respiratory distress. He has no wheezes.  Abdominal: Soft. He exhibits no distension and no mass. There is no tenderness. There is no rebound and no guarding.  Neurological: He is alert and oriented to person, place, and time. He exhibits normal muscle tone. Coordination normal.  Skin: No rash noted. No pallor.  Psychiatric: He has a normal mood and affect.    ED Course  Procedures (including critical care time) Labs Review Labs Reviewed  I-STAT CHEM 8, ED - Abnormal; Notable for the following:    Creatinine, Ser 1.60 (*)    Hemoglobin 18.7 (*)    HCT 55.0 (*)    All other components within normal limits  PROTIME-INR  I-STAT TROPOININ, ED   Imaging Review No results found.   EKG Interpretation At time 17:48, SR at rate 74, LAE, early repol, unchanged from ECG on 09/21/13.    8:09 PM In review of prior records, discharge BP was 122/78.   Pt's BP is improved after eating and IVF's.  Will ambulate, if he is asymptomatic, will d.c home.  INR is normal, will need to ensure he is compliant and to follow up with coumadin clinic.     Pt improved after 2 L of IVF's, able to ambulate without difficulty or feeling faint.  Pt counseled.  MDM   Final diagnoses:  Hypovolemia  Subtherapeutic international normalized ratio (INR)    Pt is s/p recent stroke on new medications, didn't eat today, then went and donated plasma.  Pt is hypotensive here, I strongly suspect hypovolemia as root cause exacerbated by other confounding issues simultaneously.  Will check basic labs, ECG but I suspect with time and some IVF rehydration, pt's symptoms will resolve.      Gavin Pound. Oletta Lamas, MD 10/24/13 [phone]

## 2013-10-24 ENCOUNTER — Encounter: Payer: Self-pay | Admitting: Internal Medicine

## 2014-07-13 ENCOUNTER — Encounter (HOSPITAL_COMMUNITY): Payer: Self-pay | Admitting: Cardiovascular Disease

## 2014-09-20 ENCOUNTER — Inpatient Hospital Stay (HOSPITAL_COMMUNITY)
Admission: EM | Admit: 2014-09-20 | Discharge: 2014-09-22 | DRG: 303 | Disposition: A | Discharge status: Home / Self Care | Payer: Medicaid Other | Attending: Internal Medicine | Admitting: Internal Medicine

## 2014-09-20 ENCOUNTER — Encounter (HOSPITAL_COMMUNITY): Payer: Self-pay | Admitting: *Deleted

## 2014-09-20 ENCOUNTER — Emergency Department (HOSPITAL_COMMUNITY): Payer: Medicaid Other

## 2014-09-20 DIAGNOSIS — F1490 Cocaine use, unspecified, uncomplicated: Secondary | ICD-10-CM | POA: Diagnosis present

## 2014-09-20 DIAGNOSIS — I10 Essential (primary) hypertension: Secondary | ICD-10-CM

## 2014-09-20 DIAGNOSIS — E440 Moderate protein-calorie malnutrition: Secondary | ICD-10-CM | POA: Insufficient documentation

## 2014-09-20 DIAGNOSIS — Z7901 Long term (current) use of anticoagulants: Secondary | ICD-10-CM

## 2014-09-20 DIAGNOSIS — Z8249 Family history of ischemic heart disease and other diseases of the circulatory system: Secondary | ICD-10-CM

## 2014-09-20 DIAGNOSIS — R079 Chest pain, unspecified: Secondary | ICD-10-CM

## 2014-09-20 DIAGNOSIS — Z79899 Other long term (current) drug therapy: Secondary | ICD-10-CM

## 2014-09-20 DIAGNOSIS — Z7982 Long term (current) use of aspirin: Secondary | ICD-10-CM

## 2014-09-20 DIAGNOSIS — I25119 Atherosclerotic heart disease of native coronary artery with unspecified angina pectoris: Principal | ICD-10-CM | POA: Diagnosis present

## 2014-09-20 DIAGNOSIS — Z86711 Personal history of pulmonary embolism: Secondary | ICD-10-CM

## 2014-09-20 DIAGNOSIS — Z8673 Personal history of transient ischemic attack (TIA), and cerebral infarction without residual deficits: Secondary | ICD-10-CM

## 2014-09-20 DIAGNOSIS — F17210 Nicotine dependence, cigarettes, uncomplicated: Secondary | ICD-10-CM | POA: Diagnosis present

## 2014-09-20 DIAGNOSIS — R001 Bradycardia, unspecified: Secondary | ICD-10-CM | POA: Diagnosis not present

## 2014-09-20 DIAGNOSIS — I63231 Cerebral infarction due to unspecified occlusion or stenosis of right carotid arteries: Secondary | ICD-10-CM | POA: Diagnosis present

## 2014-09-20 DIAGNOSIS — Z9114 Patient's other noncompliance with medication regimen: Secondary | ICD-10-CM

## 2014-09-20 DIAGNOSIS — G4733 Obstructive sleep apnea (adult) (pediatric): Secondary | ICD-10-CM | POA: Diagnosis present

## 2014-09-20 DIAGNOSIS — F1290 Cannabis use, unspecified, uncomplicated: Secondary | ICD-10-CM | POA: Diagnosis present

## 2014-09-20 DIAGNOSIS — Z955 Presence of coronary angioplasty implant and graft: Secondary | ICD-10-CM

## 2014-09-20 DIAGNOSIS — I252 Old myocardial infarction: Secondary | ICD-10-CM

## 2014-09-20 DIAGNOSIS — Z9119 Patient's noncompliance with other medical treatment and regimen: Secondary | ICD-10-CM | POA: Diagnosis present

## 2014-09-20 DIAGNOSIS — F329 Major depressive disorder, single episode, unspecified: Secondary | ICD-10-CM | POA: Diagnosis present

## 2014-09-20 DIAGNOSIS — E785 Hyperlipidemia, unspecified: Secondary | ICD-10-CM | POA: Diagnosis present

## 2014-09-20 DIAGNOSIS — Z823 Family history of stroke: Secondary | ICD-10-CM

## 2014-09-20 DIAGNOSIS — M5030 Other cervical disc degeneration, unspecified cervical region: Secondary | ICD-10-CM | POA: Diagnosis present

## 2014-09-20 DIAGNOSIS — Z981 Arthrodesis status: Secondary | ICD-10-CM

## 2014-09-20 DIAGNOSIS — R071 Chest pain on breathing: Secondary | ICD-10-CM

## 2014-09-20 DIAGNOSIS — Z888 Allergy status to other drugs, medicaments and biological substances status: Secondary | ICD-10-CM

## 2014-09-20 DIAGNOSIS — I6521 Occlusion and stenosis of right carotid artery: Secondary | ICD-10-CM | POA: Diagnosis present

## 2014-09-20 LAB — BASIC METABOLIC PANEL
ANION GAP: 4 — AB (ref 5–15)
BUN: 14 mg/dL (ref 6–23)
CHLORIDE: 108 mmol/L (ref 96–112)
CO2: 27 mmol/L (ref 19–32)
CREATININE: 1.41 mg/dL — AB (ref 0.50–1.35)
Calcium: 8.8 mg/dL (ref 8.4–10.5)
GFR calc Af Amer: 65 mL/min — ABNORMAL LOW (ref >90)
GFR calc non Af Amer: 56 mL/min — ABNORMAL LOW (ref >90)
Glucose, Bld: 86 mg/dL (ref 70–99)
Potassium: 4 mmol/L (ref 3.5–5.1)
Sodium: 139 mmol/L (ref 135–145)

## 2014-09-20 LAB — CBC
HEMATOCRIT: 44.6 % (ref 39.0–52.0)
Hemoglobin: 15.3 g/dL (ref 13.0–17.0)
MCH: 31.6 pg (ref 26.0–34.0)
MCHC: 34.3 g/dL (ref 30.0–36.0)
MCV: 92.1 fL (ref 78.0–100.0)
Platelets: 268 10*3/uL (ref 150–400)
RBC: 4.84 MIL/uL (ref 4.22–5.81)
RDW: 13.7 % (ref 11.5–15.5)
WBC: 5.8 10*3/uL (ref 4.0–10.5)

## 2014-09-20 LAB — D-DIMER, QUANTITATIVE (NOT AT ARMC): D-Dimer, Quant: <0.27 ug/mL-FEU (ref 0.00–0.48)

## 2014-09-20 LAB — TROPONIN I
Troponin I: <0.03 ng/mL (ref <0.031)
Troponin I: <0.03 ng/mL (ref <0.031)

## 2014-09-20 LAB — PROTIME-INR
INR: 1.05 (ref 0.00–1.49)
Prothrombin Time: 13.8 seconds (ref 11.6–15.2)

## 2014-09-20 LAB — BRAIN NATRIURETIC PEPTIDE: B Natriuretic Peptide: 33.9 pg/mL (ref 0.0–100.0)

## 2014-09-20 MED ORDER — ASPIRIN 81 MG PO CHEW
324.0000 mg | CHEWABLE_TABLET | Freq: Once | ORAL | Status: AC
  Administered 2014-09-20: 324 mg via ORAL
  Filled 2014-09-20: qty 4

## 2014-09-20 MED ORDER — ASPIRIN 81 MG PO CHEW
81.0000 mg | CHEWABLE_TABLET | Freq: Every day | ORAL | Status: DC
  Administered 2014-09-21 – 2014-09-22 (×2): 81 mg via ORAL
  Filled 2014-09-20 (×2): qty 1

## 2014-09-20 MED ORDER — METOPROLOL TARTRATE 12.5 MG HALF TABLET
12.5000 mg | ORAL_TABLET | Freq: Two times a day (BID) | ORAL | Status: DC
  Administered 2014-09-21 (×2): 12.5 mg via ORAL
  Filled 2014-09-20 (×3): qty 1

## 2014-09-20 MED ORDER — HEPARIN (PORCINE) IN NACL 100-0.45 UNIT/ML-% IJ SOLN
1500.0000 [IU]/h | INTRAMUSCULAR | Status: DC
  Administered 2014-09-20: 1500 [IU]/h via INTRAVENOUS
  Filled 2014-09-20 (×2): qty 250

## 2014-09-20 MED ORDER — ONDANSETRON HCL 4 MG/2ML IJ SOLN: 4.0000 mg | Freq: Four times a day (QID) | INTRAMUSCULAR | Status: DC | PRN

## 2014-09-20 MED ORDER — MOMETASONE FURO-FORMOTEROL FUM 100-5 MCG/ACT IN AERO
2.0000 | INHALATION_SPRAY | Freq: Two times a day (BID) | RESPIRATORY_TRACT | Status: DC
  Administered 2014-09-21 – 2014-09-22 (×3): 2 via RESPIRATORY_TRACT
  Filled 2014-09-20: qty 8.8

## 2014-09-20 MED ORDER — ONDANSETRON HCL 4 MG/2ML IJ SOLN
4.0000 mg | Freq: Once | INTRAMUSCULAR | Status: AC
  Administered 2014-09-20: 4 mg via INTRAVENOUS
  Filled 2014-09-20: qty 2

## 2014-09-20 MED ORDER — MORPHINE SULFATE 4 MG/ML IJ SOLN
4.0000 mg | Freq: Once | INTRAMUSCULAR | Status: AC
  Administered 2014-09-20: 4 mg via INTRAVENOUS
  Filled 2014-09-20: qty 1

## 2014-09-20 MED ORDER — WARFARIN SODIUM 10 MG PO TABS
10.0000 mg | ORAL_TABLET | ORAL | Status: AC
Start: 2014-09-20 — End: 2014-09-21
  Administered 2014-09-21: 10 mg via ORAL
  Filled 2014-09-20 (×2): qty 1

## 2014-09-20 MED ORDER — ACETAMINOPHEN 325 MG PO TABS: 650.0000 mg | ORAL_TABLET | ORAL | Status: DC | PRN

## 2014-09-20 MED ORDER — LISINOPRIL 5 MG PO TABS
5.0000 mg | ORAL_TABLET | Freq: Every day | ORAL | Status: DC
  Administered 2014-09-21 – 2014-09-22 (×2): 5 mg via ORAL
  Filled 2014-09-20 (×2): qty 1

## 2014-09-20 MED ORDER — HEPARIN BOLUS VIA INFUSION
3000.0000 [IU] | Freq: Once | INTRAVENOUS | Status: AC
  Administered 2014-09-20: 3000 [IU] via INTRAVENOUS
  Filled 2014-09-20: qty 3000

## 2014-09-20 MED ORDER — WARFARIN - PHARMACIST DOSING INPATIENT
Freq: Every day | Status: DC
  Administered 2014-09-21: 18:00:00

## 2014-09-20 MED ORDER — IOHEXOL 350 MG/ML SOLN
100.0000 mL | Freq: Once | INTRAVENOUS | Status: AC | PRN
  Administered 2014-09-20: 100 mL via INTRAVENOUS

## 2014-09-20 MED ORDER — ATORVASTATIN CALCIUM 80 MG PO TABS
80.0000 mg | ORAL_TABLET | Freq: Every day | ORAL | Status: DC
  Administered 2014-09-21: 80 mg via ORAL
  Filled 2014-09-20 (×2): qty 1

## 2014-09-20 NOTE — Consult Note (Signed)
CARDIOLOGY CONSULT NOTE  Patient ID: Timothy BosworthByron G Sieh  MRN: [id number]  DOB: 07/28/1961  Admit date: 09/20/2014 Date of Consult: 09/20/2014  Primary Cardiologist: [name], DAVID W, MD   Reason for Consultation: Chest pain  History of Present Illness: Timothy Peck is a 54 y.o. male significant for NSTEMI in July 2013 with PCI to prox LAD at that time, subsequently found to be patent when he presented with recurrent chest pain several months later.  In august 2014 he presetned again with chest pain and was found to have bilateral lower lobe PE and was started on anticoagulation.  He presents to the ED today with pressure like chest pain along the left side of his chest, starting yesterday, but worse this afternoon.  Occurs at rest, worse with inspiration, worse with movement from side to side.  He has been off all of his medications for at least one month, including xaralto and aspirin.  Has upcoming outpatient appointment with Dr. Herbie BaltimoreHarding.   Past Medical History  Diagnosis Date  . Depression   . Degenerative disc disease, cervical     Dx years ago  . CAD (coronary artery disease) 02/2012    NSTEMI - PCI to prox LAD  . Presence of drug coated stent in LAD coronary artery 02/2012    Promus Element 4.0 mm x 28 mm  . MI (myocardial infarction) 02/02/2012    Normal 2D Echo - EF-60  . Hematoma, s/p cath -rt wrist 12/22/2012  . Chest pain, stable cardiac cath 12/2012 12/22/2012  . NSTEMI (non-ST elevated myocardial infarction), 07/18/13, stable cath with patent stent. 01/31/2012  . Chronic anticoagulation, since 03/2013 for PE 07/19/2013  . Stroke     Past Surgical History  Procedure Laterality Date  . Tumor removal      left foot third toe  . Anterior cervical decomp/discectomy fusion  11/28/2011    Procedure: ANTERIOR CERVICAL DECOMPRESSION/DISCECTOMY FUSION 1 LEVEL;  Surgeon: Temple PaciniHenry A Pool, MD;  Location: [facility];  Service: Neurosurgery;  Laterality: N/A;  Anterior Cervical Six-Seven  Decompression and Fusion  . Cardiac catheterization  01/31/2012    Promeus Element DES 4 x 28, balloon-Emerge monorail  . Cardiac catheterization  12/17/12    patent stent, stable CAD  . Left heart catheterization with coronary angiogram N/A 01/31/2012    Procedure: LEFT HEART CATHETERIZATION WITH CORONARY ANGIOGRAM;  Surgeon: Runell GessJonathan J Berry, MD;  [facility];  Service: Cardiovascular;  Laterality: N/A;  . Percutaneous coronary stent intervention (pci-s) N/A 02/02/2012    Procedure: PERCUTANEOUS CORONARY STENT INTERVENTION (PCI-S);  Surgeon: Marykay Lexavid W Harding, MD;  [facility];  Service: Cardiovascular;  Laterality: N/A;  . Left heart catheterization with coronary angiogram N/A 12/17/2012    Procedure: LEFT HEART CATHETERIZATION WITH CORONARY ANGIOGRAM;  Surgeon: Marykay Lexavid W Harding, MD;  [facility];  Service: Cardiovascular;  Laterality: N/A;  . Left heart catheterization with coronary angiogram N/A 07/18/2013    Procedure: LEFT HEART CATHETERIZATION WITH CORONARY ANGIOGRAM;  Surgeon: Marykay Lexavid W Harding, MD;  [facility];  Service: Cardiovascular;  Laterality: N/A;      Current Medications:    Allergies:    Allergies  Allergen Reactions  . Ibuprofen Other (See Comments)    Doctor told him he could not take    Social History:   The patient  reports that he has been smoking Cigarettes.  He started smoking about 43 years ago. He has a 21 pack-year smoking history. He  has never used smokeless tobacco. He reports that he uses illicit drugs (Cocaine, Marijuana, and Other-see comments). He reports that he does not drink alcohol.    Family History:   The patient's family history includes Aneurysm (age of onset: 59) in his mother; Coronary artery disease in his father; Stroke (age of onset: 64) in his father. There is no history of Anesthesia problems, Hypotension, Malignant hyperthermia, or Pseudochol deficiency.   ROS:  Please see the history of present illness.     All other systems reviewed and negative.   Vital Signs: Blood pressure 145/96, pulse 37, temperature 97.7 F (36.5 C), temperature source Oral, resp. rate 14, SpO2 96 %.   PHYSICAL EXAM: General:  Well nourished, well developed, in no acute distress HEENT: normal Lymph: no adenopathy Neck: no JVD Endocrine:  No thryomegaly Vascular: No carotid bruits; FA pulses 2+ bilaterally without bruits Cardiac:  normal S1, S2; RRR; no murmur, reproducible chest pain on palpitation. Lungs:  clear to auscultation bilaterally, no wheezing, rhonchi or rales Abd: soft, nontender, no hepatomegaly Ext: no edema Musculoskeletal:  No deformities, BUE and BLE strength normal and equal Skin: warm and dry Neuro:  CNs 2-12 intact, no focal abnormalities noted Psych:  Normal affect   EKG:  Peaked t waves v1-v3 which is similar but increased compared to prior (10/2013), otherwise NSR without ischemic changes.   Labs:  Recent Labs  09/20/14 1750  TROPONINI <0.03   Lab Results  Component Value Date   WBC 5.8 09/20/2014   HGB 15.3 09/20/2014   HCT 44.6 09/20/2014   MCV 92.1 09/20/2014   PLT 268 09/20/2014    Recent Labs Lab 09/20/14 1750  NA 139  K 4.0  CL 108  CO2 27  BUN 14  CREATININE 1.41*  CALCIUM 8.8  GLUCOSE 86   Lab Results  Component Value Date   CHOL 154 09/22/2013   HDL 43 09/22/2013   LDLCALC 92 09/22/2013   TRIG 93 09/22/2013   Lab Results  Component Value Date   DDIMER <0.27 09/20/2014    Radiology/Studies:  Dg Chest 2 View  09/20/2014   CLINICAL DATA:  Left-sided chest pain. Shortness of breath. Coronary artery disease.  EXAM: CHEST  2 VIEW  COMPARISON:  07/18/2013, 03/13/2013, 01/31/2012, 04/09/2011 and 12/10/2009  FINDINGS: Heart size and pulmonary vascularity are normal. There is tortuosity of the thoracic aorta. Density overlying the right seventh costovertebral junction is felt to be an overlap of osseous structures and appears essentially unchanged since  12/10/2009.  The lungs are clear.  No effusions.  IMPRESSION: No active cardiopulmonary disease.   Electronically Signed   By: Francene Boyers M.D.   On: 09/20/2014 19:09    ASSESSMENT AND PLAN:  1. Atypical Chest Pain - His chest pain is atypical, and consistent with chest wall pain with reproducible pain on palpation and inspiration - DDimer is negative, first troponin is negative. - Given his history it is reasonable to observe him overnight to rule out MI. - Please repeat ECG tonight to ensure anterior changes are chronic and not evolving. - Social work for home medication support.     Kelly Splinter 09/20/2014 9:53 PM

## 2014-09-20 NOTE — ED Notes (Signed)
  152/89 right arm 133/110 left arm

## 2014-09-20 NOTE — Progress Notes (Signed)
ANTICOAGULATION CONSULT NOTE - Initial Consult  Pharmacy Consult for heparin and warfarin Indication: chest pain/ACS and h/o PE  Allergies  Allergen Reactions  . Ibuprofen Other (See Comments)    Doctor told him he could not take    Patient Measurements: Height: 6\' 4"  (193 cm) Weight: 199 lb 15.3 oz (90.7 kg) IBW/kg (Calculated) : 86.8  Vital Signs: Temp: 97.7 F (36.5 C) (02/17 1741) Temp Source: Oral (02/17 1741) BP: 145/90 mmHg (02/17 2245) Pulse Rate: 43 (02/17 2245)  Labs:  Recent Labs  09/20/14 1750 09/20/14 1901  HGB 15.3  --   HCT 44.6  --   PLT 268  --   LABPROT  --  13.8  INR  --  1.05  CREATININE 1.41*  --   TROPONINI <0.03  --     Estimated Creatinine Clearance: 76.1 mL/min (by C-G formula based on Cr of 1.41).   Medical History: Past Medical History  Diagnosis Date  . Depression   . Degenerative disc disease, cervical     Dx years ago  . CAD (coronary artery disease) 02/2012    NSTEMI - PCI to prox LAD  . Presence of drug coated stent in LAD coronary artery 02/2012    Promus Element 4.0 mm x 28 mm  . MI (myocardial infarction) 02/02/2012    Normal 2D Echo - EF-60  . Hematoma, s/p cath -rt wrist 12/22/2012  . Chest pain, stable cardiac cath 12/2012 12/22/2012  . NSTEMI (non-ST elevated myocardial infarction), 07/18/13, stable cath with patent stent. 01/31/2012  . Chronic anticoagulation, since 03/2013 for PE 07/19/2013  . Stroke       Assessment: 54yo male c/o CP x2d associated w/ SOB and diaphoresis, has h/o NSTEMI, CVA, and PE; had been on Xarelto for PE which was changed to Coumadin after stroke and R ICA stenosis though has been noncompliant for about a year with all meds; now to resume anticoagulation; noted that CT negative for new PE and troponin currently negative.  Goal of Therapy:  INR 2-3 Heparin level 0.3-0.7 units/ml Monitor platelets by anticoagulation protocol: Yes   Plan:  Will give heparin 3000 units IV bolus x1 followed by  gtt at 1500 units/hr and monitor heparin levels and CBC; will give Coumadin 10mg  po x1 and monitor INR for dose adjustments; will begin re-education about Coumadin.  Vernard GamblesVeronda Jammie Clink, PharmD, BCPS  09/20/2014,11:23 PM

## 2014-09-20 NOTE — ED Notes (Signed)
The pt is c/oi mid-chest pain foir 2 days with sob and diaphoresis.  Hx mi

## 2014-09-20 NOTE — ED Notes (Signed)
MD Rancour at bedside 

## 2014-09-20 NOTE — H&P (Addendum)
[facility] History and Physical  Timothy Peck ZOX:[id number] DOB: [birth year] DOA: 09/20/2014  Referring physician: EDP PCP: Marykay Lex, MD   Chief Complaint: Chest pain   HPI: Timothy Peck is a 54 y.o. male with h/o NSTEMI in July 2013, PCI to prox LAD.  Several months later had PE and started on Xarelto.  Xarelto was switched to brilinta, which was then changed to coumadin for stroke prophylaxis in the setting of known R ICA stenosis back in 09/21/13.  Office visit note on 10/10/13 states that this due to Brilinta not approved or studied in R ICA stenosis for prophylaxis of stroke.  Unfortunately he hasn't been compliant with or taking coumadin or any other anticoagulant, or any of his home meds for that matter for about the past year since that time.  Chest pain today is pressure like sensation along the left side of his chest.  Onset yesterday but worse this afternoon.  Occurs at rest, worse with inspiration, worse with movement.  Review of Systems: Systems reviewed.  As above, otherwise negative  Past Medical History  Diagnosis Date  . Depression   . Degenerative disc disease, cervical     Dx years ago  . CAD (coronary artery disease) 02/2012    NSTEMI - PCI to prox LAD  . Presence of drug coated stent in LAD coronary artery 02/2012    Promus Element 4.0 mm x 28 mm  . MI (myocardial infarction) 02/02/2012    Normal 2D Echo - EF-60  . Hematoma, s/p cath -rt wrist 12/22/2012  . Chest pain, stable cardiac cath 12/2012 12/22/2012  . NSTEMI (non-ST elevated myocardial infarction), 07/18/13, stable cath with patent stent. 01/31/2012  . Chronic anticoagulation, since 03/2013 for PE 07/19/2013  . Stroke    Past Surgical History  Procedure Laterality Date  . Tumor removal      left foot third toe  . Anterior cervical decomp/discectomy fusion  11/28/2011    Procedure: ANTERIOR CERVICAL DECOMPRESSION/DISCECTOMY FUSION 1 LEVEL;  Surgeon: Temple Pacini, MD;  Location: [facility];   Service: Neurosurgery;  Laterality: N/A;  Anterior Cervical Six-Seven Decompression and Fusion  . Cardiac catheterization  01/31/2012    Promeus Element DES 4 x 28, balloon-Emerge monorail  . Cardiac catheterization  12/17/12    patent stent, stable CAD  . Left heart catheterization with coronary angiogram N/A 01/31/2012    Procedure: LEFT HEART CATHETERIZATION WITH CORONARY ANGIOGRAM;  Surgeon: Runell Gess, MD;  Location: [facility] CATH LAB;  Service: Cardiovascular;  Laterality: N/A;  . Percutaneous coronary stent intervention (pci-s) N/A 02/02/2012    Procedure: PERCUTANEOUS CORONARY STENT INTERVENTION (PCI-S);  Surgeon: Marykay Lex, MD;  Location: [facility] CATH LAB;  Service: Cardiovascular;  Laterality: N/A;  . Left heart catheterization with coronary angiogram N/A 12/17/2012    Procedure: LEFT HEART CATHETERIZATION WITH CORONARY ANGIOGRAM;  Surgeon: Marykay Lex, MD;  Location: [facility] CATH LAB;  Service: Cardiovascular;  Laterality: N/A;  . Left heart catheterization with coronary angiogram N/A 07/18/2013    Procedure: LEFT HEART CATHETERIZATION WITH CORONARY ANGIOGRAM;  Surgeon: Marykay Lex, MD;  Location: [facility] CATH LAB;  Service: Cardiovascular;  Laterality: N/A;   Social History:  reports that he has been smoking Cigarettes.  He started smoking about 43 years ago. He has a 21 pack-year smoking history. He has never used smokeless tobacco. He reports that he uses illicit drugs (Cocaine, Marijuana, and Other-see comments). He reports that he does not drink alcohol.  Allergies  Allergen Reactions  . Ibuprofen Other (See Comments)    Doctor told him he could not take    Family History  Problem Relation Age of Onset  . Anesthesia problems Neg Hx   . Hypotension Neg Hx   . Malignant hyperthermia Neg Hx   . Pseudochol deficiency Neg Hx   . Coronary artery disease Father   . Aneurysm Mother 45    intracranial aneurysm rupture  . Stroke Father [id number]     Prior to Admission medications    Medication Sig Start Date End Date Taking? Authorizing Provider  aspirin EC 325 MG tablet Take 1 tablet (325 mg total) by mouth daily. Patient not taking: Reported on 09/20/2014 09/27/13   Gust RungErik C Hoffman, DO  atorvastatin (LIPITOR) 80 MG tablet Take 1 tablet (80 mg total) by mouth daily at 6 PM. Patient not taking: Reported on 09/20/2014 09/27/13   Gust RungErik C Hoffman, DO  lisinopril (PRINIVIL,ZESTRIL) 5 MG tablet Take 1 tablet (5 mg total) by mouth daily. Patient not taking: Reported on 09/20/2014 09/27/13   Gust RungErik C Hoffman, DO  metoprolol tartrate (LOPRESSOR) 12.5 mg TABS tablet Take 0.5 tablets (12.5 mg total) by mouth 2 (two) times daily. Patient not taking: Reported on 09/20/2014 09/27/13   Gust RungErik C Hoffman, DO  mometasone-formoterol (DULERA) 100-5 MCG/ACT AERO Inhale 2 puffs into the lungs 2 (two) times daily. Patient not taking: Reported on 09/20/2014 09/27/13   Gust RungErik C Hoffman, DO  warfarin (COUMADIN) 7.5 MG tablet Take 1 tablet (7.5 mg total) by mouth one time only at 6 PM. Patient not taking: Reported on 09/20/2014 09/27/13   Gust RungErik C Hoffman, DO   Physical Exam: Filed Vitals:   09/20/14 2245  BP: 145/90  Pulse: 43  Temp:   Resp:     BP 145/90 mmHg  Pulse 43  Temp(Src) 97.7 F (36.5 C) (Oral)  Resp 14  SpO2 100%  General Appearance:    Alert, oriented, no distress, appears stated age  Head:    Normocephalic, atraumatic  Eyes:    PERRL, EOMI, sclera non-icteric        Nose:   Nares without drainage or epistaxis. Mucosa, turbinates normal  Throat:   Moist mucous membranes. Oropharynx without erythema or exudate.  Neck:   Supple. No carotid bruits.  No thyromegaly.  No lymphadenopathy.   Back:     No CVA tenderness, no spinal tenderness  Lungs:     Clear to auscultation bilaterally, without wheezes, rhonchi or rales  Chest wall:    No tenderness to palpitation  Heart:    Regular rate and rhythm without murmurs, gallops, rubs  Abdomen:     Soft, non-tender, nondistended, normal bowel  sounds, no organomegaly  Genitalia:    deferred  Rectal:    deferred  Extremities:   No clubbing, cyanosis or edema.  Pulses:   2+ and symmetric all extremities  Skin:   Skin color, texture, turgor normal, no rashes or lesions  Lymph nodes:   Cervical, supraclavicular, and axillary nodes normal  Neurologic:   CNII-XII intact. Normal strength, sensation and reflexes      throughout    Labs on Admission:  Basic Metabolic Panel:  Recent Labs Lab 09/20/14 1750  NA 139  K 4.0  CL 108  CO2 27  GLUCOSE 86  BUN 14  CREATININE 1.41*  CALCIUM 8.8   Liver Function Tests: No results for input(s): AST, ALT, ALKPHOS, BILITOT, PROT, ALBUMIN in the last 168 hours. No results for input(s): LIPASE,  AMYLASE in the last 168 hours. No results for input(s): AMMONIA in the last 168 hours. CBC:  Recent Labs Lab 09/20/14 1750  WBC 5.8  HGB 15.3  HCT 44.6  MCV 92.1  PLT 268   Cardiac Enzymes:  Recent Labs Lab 09/20/14 1750  TROPONINI <0.03    BNP (last 3 results) No results for input(s): PROBNP in the last 8760 hours. CBG: No results for input(s): GLUCAP in the last 168 hours.  Radiological Exams on Admission: Dg Chest 2 View  09/20/2014   CLINICAL DATA:  Left-sided chest pain. Shortness of breath. Coronary artery disease.  EXAM: CHEST  2 VIEW  COMPARISON:  07/18/2013, 03/13/2013, 01/31/2012, 04/09/2011 and 12/10/2009  FINDINGS: Heart size and pulmonary vascularity are normal. There is tortuosity of the thoracic aorta. Density overlying the right seventh costovertebral junction is felt to be an overlap of osseous structures and appears essentially unchanged since 12/10/2009.  The lungs are clear.  No effusions.  IMPRESSION: No active cardiopulmonary disease.   Electronically Signed   By: Francene Boyers M.D.   On: 09/20/2014 19:09   Ct Angio Chest Aorta W/cm &/or Wo/cm  09/20/2014   CLINICAL DATA:  Mid chest pain for 2 days with diaphoresis and shortness of breath.  EXAM: CT  ANGIOGRAPHY CHEST, ABDOMEN AND PELVIS  TECHNIQUE: Multidetector CT imaging through the chest, abdomen and pelvis was performed using the standard protocol during bolus administration of intravenous contrast. Multiplanar reconstructed images and MIPs were obtained and reviewed to evaluate the vascular anatomy.  CONTRAST:  100 mL OMNIPAQUE IOHEXOL 350 MG/ML SOLN  COMPARISON:  CT chest 03/14/2013.  FINDINGS: CTA CHEST FINDINGS  There is no aortic dissection or aneurysm. No pulmonary embolus is identified. The patient has a bovine type aortic arch. Heart size is normal. Stent in the LAD is noted. No pleural or pericardial effusion. The lungs demonstrate some dependent atelectatic change. No focal bony abnormality is identified.  Review of the MIP images confirms the above findings.  CTA ABDOMEN AND PELVIS FINDINGS  There is no aortic dissection or aneurysm. Major branch vessels of the aorta are widely patent. Scattered, mild calcific aortic atherosclerosis is noted. A 1.7 cm cyst is noted in the left hepatic lobe. The liver is otherwise unremarkable. The gallbladder, adrenal glands, spleen, pancreas and right kidney appear normal. A 4.3 cm left renal cyst is incidentally noted. No lymphadenopathy or fluid is identified. The stomach, small and large bowel and appendix appear normal. No lytic or sclerotic bony lesion is seen.  Review of the MIP images confirms the above findings.  IMPRESSION: Negative for aortic dissection or aneurysm. No acute finding chest, abdomen or pelvis.  Mild abdominal aortic atherosclerosis.   Electronically Signed   By: Drusilla Kanner M.D.   On: 09/20/2014 22:21   Ct Cta Abd/pel W/cm &/or W/o Cm  09/20/2014   CLINICAL DATA:  Mid chest pain for 2 days with diaphoresis and shortness of breath.  EXAM: CT ANGIOGRAPHY CHEST, ABDOMEN AND PELVIS  TECHNIQUE: Multidetector CT imaging through the chest, abdomen and pelvis was performed using the standard protocol during bolus administration of  intravenous contrast. Multiplanar reconstructed images and MIPs were obtained and reviewed to evaluate the vascular anatomy.  CONTRAST:  100 mL OMNIPAQUE IOHEXOL 350 MG/ML SOLN  COMPARISON:  CT chest 03/14/2013.  FINDINGS: CTA CHEST FINDINGS  There is no aortic dissection or aneurysm. No pulmonary embolus is identified. The patient has a bovine type aortic arch. Heart size is normal. Stent in the  LAD is noted. No pleural or pericardial effusion. The lungs demonstrate some dependent atelectatic change. No focal bony abnormality is identified.  Review of the MIP images confirms the above findings.  CTA ABDOMEN AND PELVIS FINDINGS  There is no aortic dissection or aneurysm. Major branch vessels of the aorta are widely patent. Scattered, mild calcific aortic atherosclerosis is noted. A 1.7 cm cyst is noted in the left hepatic lobe. The liver is otherwise unremarkable. The gallbladder, adrenal glands, spleen, pancreas and right kidney appear normal. A 4.3 cm left renal cyst is incidentally noted. No lymphadenopathy or fluid is identified. The stomach, small and large bowel and appendix appear normal. No lytic or sclerotic bony lesion is seen.  Review of the MIP images confirms the above findings.  IMPRESSION: Negative for aortic dissection or aneurysm. No acute finding chest, abdomen or pelvis.  Mild abdominal aortic atherosclerosis.   Electronically Signed   By: Drusilla Kanner M.D.   On: 09/20/2014 22:21    EKG: Independently reviewed.  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 Chest pain - see Dr. Theone Murdoch note 1. Chest pain obs protocol 2. Will go ahead and allow diet since low risk for cardiac etiology per Dr. Theone Murdoch note 3. Serial trops 4. Tele monitor 2. HTN - Resume home  beta blocker, ACEi that he hasnt been on but is supposed to be taking 3. Dyslipidemia - resume statin 4. H/o stroke, PE, and need for anticoagulation - heparin bridge to coumadin   Code Status: Full Code  Family Communication: No family in room Disposition Plan: Admit to obs   Time spent: 70 min  GARDNER, JARED M. [facility] Pager [phone]  If 7AM-7PM, please contact the day team taking care of the patient [email or web address] Password [email or web address] 09/20/2014, 11:13 PM

## 2014-09-20 NOTE — ED Notes (Signed)
Pt in xray. Pt will be brought to B14 upon completion.

## 2014-09-20 NOTE — ED Provider Notes (Signed)
CSN: [id number]     Arrival date & time 09/20/14  1737 History   First MD Initiated Contact with Patient 09/20/14 1848     Chief Complaint  Patient presents with  . Chest Pain     (Consider location/radiation/quality/duration/timing/severity/associated sxs/prior Treatment) HPI Comments: She reports central chest pain that has been constant for the past 2 days. It radiates to her left arm it feels like a pressure. Endorses shortness of breath and intermittent diaphoresis. History of MI and noncompliance with medications. He states he's been out of his medications for several months. He is supposed to be on Coumadin. He had a stroke last year without residual deficits. Reports CAD with stents. Pain is similar to previous MI. It does not radiate to back or neck. He endorses diaphoresis and shortness of breath with this pain.  The history is provided by the patient.    Past Medical History  Diagnosis Date  . Depression   . Degenerative disc disease, cervical     Dx years ago  . CAD (coronary artery disease) 02/2012    NSTEMI - PCI to prox LAD  . Presence of drug coated stent in LAD coronary artery 02/2012    Promus Element 4.0 mm x 28 mm  . MI (myocardial infarction) 02/02/2012    Normal 2D Echo - EF-60  . Hematoma, s/p cath -rt wrist 12/22/2012  . Chest pain, stable cardiac cath 12/2012 12/22/2012  . NSTEMI (non-ST elevated myocardial infarction), 07/18/13, stable cath with patent stent. 01/31/2012  . Chronic anticoagulation, since 03/2013 for PE 07/19/2013  . Stroke    Past Surgical History  Procedure Laterality Date  . Tumor removal      left foot third toe  . Anterior cervical decomp/discectomy fusion  11/28/2011    Procedure: ANTERIOR CERVICAL DECOMPRESSION/DISCECTOMY FUSION 1 LEVEL;  Surgeon: Temple PaciniHenry A Pool, MD;  Location: [facility];  Service: Neurosurgery;  Laterality: N/A;  Anterior Cervical Six-Seven Decompression and Fusion  . Cardiac catheterization  01/31/2012    Promeus Element  DES 4 x 28, balloon-Emerge monorail  . Cardiac catheterization  12/17/12    patent stent, stable CAD  . Left heart catheterization with coronary angiogram N/A 01/31/2012    Procedure: LEFT HEART CATHETERIZATION WITH CORONARY ANGIOGRAM;  Surgeon: Runell GessJonathan J Berry, MD;  [facility];  Service: Cardiovascular;  Laterality: N/A;  . Percutaneous coronary stent intervention (pci-s) N/A 02/02/2012    Procedure: PERCUTANEOUS CORONARY STENT INTERVENTION (PCI-S);  Surgeon: Marykay Lexavid W Harding, MD;  [facility];  Service: Cardiovascular;  Laterality: N/A;  . Left heart catheterization with coronary angiogram N/A 12/17/2012    Procedure: LEFT HEART CATHETERIZATION WITH CORONARY ANGIOGRAM;  Surgeon: Marykay Lexavid W Harding, MD;  [facility];  Service: Cardiovascular;  Laterality: N/A;  . Left heart catheterization with coronary angiogram N/A 07/18/2013    Procedure: LEFT HEART CATHETERIZATION WITH CORONARY ANGIOGRAM;  Surgeon: Marykay Lexavid W Harding, MD;  [facility];  Service: Cardiovascular;  Laterality: N/A;   Family History  Problem Relation Age of Onset  . Anesthesia problems Neg Hx   . Hypotension Neg Hx   . Malignant hyperthermia Neg Hx   . Pseudochol deficiency Neg Hx   . Coronary artery disease Father   . Aneurysm Mother 45    intracranial aneurysm rupture  . Stroke Father [id number]   History  Substance Use Topics  . Smoking status: Current Every Day Smoker -- 0.50 packs/day for 42 years    Types: Cigarettes  Start date: 01/12/1971  . Smokeless tobacco: Never Used  . Alcohol Use: No     Comment: Report being abstinent since ~January 2015    Review of Systems  Constitutional: Positive for activity change and appetite change. Negative for fever and fatigue.  Respiratory: Positive for chest tightness and shortness of breath.   Cardiovascular: Positive for chest pain.  Gastrointestinal: Negative for nausea, vomiting and abdominal pain.  Genitourinary: Negative for dysuria and  hematuria.  Musculoskeletal: Negative for myalgias, back pain and arthralgias.  Skin: Negative for rash.  Neurological: Positive for dizziness. Negative for weakness, light-headedness and headaches.  A complete 10 system review of systems was obtained and all systems are negative except as noted in the HPI and PMH.      Allergies  Ibuprofen  Home Medications   Prior to Admission medications   Medication Sig Start Date End Date Taking? Authorizing Provider  aspirin EC 325 MG tablet Take 1 tablet (325 mg total) by mouth daily. Patient not taking: Reported on 09/20/2014 09/27/13   Gust Rung, DO  atorvastatin (LIPITOR) 80 MG tablet Take 1 tablet (80 mg total) by mouth daily at 6 PM. Patient not taking: Reported on 09/20/2014 09/27/13   Gust Rung, DO  lisinopril (PRINIVIL,ZESTRIL) 5 MG tablet Take 1 tablet (5 mg total) by mouth daily. Patient not taking: Reported on 09/20/2014 09/27/13   Gust Rung, DO  metoprolol tartrate (LOPRESSOR) 12.5 mg TABS tablet Take 0.5 tablets (12.5 mg total) by mouth 2 (two) times daily. Patient not taking: Reported on 09/20/2014 09/27/13   Gust Rung, DO  mometasone-formoterol (DULERA) 100-5 MCG/ACT AERO Inhale 2 puffs into the lungs 2 (two) times daily. Patient not taking: Reported on 09/20/2014 09/27/13   Gust Rung, DO  warfarin (COUMADIN) 7.5 MG tablet Take 1 tablet (7.5 mg total) by mouth one time only at 6 PM. Patient not taking: Reported on 09/20/2014 09/27/13   Gust Rung, DO   BP 145/90 mmHg  Pulse 43  Temp(Src) 97.7 F (36.5 C) (Oral)  Resp 14  Ht 6\' 4"  (1.93 m)  Wt 199 lb 15.3 oz (90.7 kg)  BMI 24.35 kg/m2  SpO2 100% Physical Exam  Constitutional: He is oriented to person, place, and time. He appears well-developed and well-nourished. No distress.  HENT:  Head: Normocephalic and atraumatic.  Mouth/Throat: Oropharynx is clear and moist. No oropharyngeal exudate.  Eyes: Conjunctivae and EOM are normal. Pupils are equal, round,  and reactive to light.  Neck: Normal range of motion. Neck supple.  No meningismus.  Cardiovascular: Normal rate, regular rhythm, normal heart sounds and intact distal pulses.   No murmur heard. Pulmonary/Chest: Effort normal and breath sounds normal. No respiratory distress. He exhibits tenderness.  Chest wall tenderness  Abdominal: Soft. There is no tenderness. There is no rebound and no guarding.  Musculoskeletal: Normal range of motion. He exhibits no edema or tenderness.  Neurological: He is alert and oriented to person, place, and time. No cranial nerve deficit. He exhibits normal muscle tone. Coordination normal.  No ataxia on finger to nose bilaterally. No pronator drift. 5/5 strength throughout. CN 2-12 intact. Negative Romberg. Equal grip strength. Sensation intact. Gait is normal.   Skin: Skin is warm.  Psychiatric: He has a normal mood and affect. His behavior is normal.  Nursing note and vitals reviewed.   ED Course  Procedures (including critical care time) Labs Review Labs Reviewed  BASIC METABOLIC PANEL - Abnormal; Notable for the following:  Creatinine, Ser 1.41 (*)    GFR calc non Af Amer 56 (*)    GFR calc Af Amer 65 (*)    Anion gap 4 (*)    All other components within normal limits  CBC  BRAIN NATRIURETIC PEPTIDE  TROPONIN I  PROTIME-INR  D-DIMER, QUANTITATIVE  TROPONIN I  TROPONIN I  TROPONIN I    Imaging Review Dg Chest 2 View  09/20/2014   CLINICAL DATA:  Left-sided chest pain. Shortness of breath. Coronary artery disease.  EXAM: CHEST  2 VIEW  COMPARISON:  07/18/2013, 03/13/2013, 01/31/2012, 04/09/2011 and 12/10/2009  FINDINGS: Heart size and pulmonary vascularity are normal. There is tortuosity of the thoracic aorta. Density overlying the right seventh costovertebral junction is felt to be an overlap of osseous structures and appears essentially unchanged since 12/10/2009.  The lungs are clear.  No effusions.  IMPRESSION: No active cardiopulmonary  disease.   Electronically Signed   By: Francene Boyers M.D.   On: 09/20/2014 19:09   Ct Angio Chest Aorta W/cm &/or Wo/cm  09/20/2014   CLINICAL DATA:  Mid chest pain for 2 days with diaphoresis and shortness of breath.  EXAM: CT ANGIOGRAPHY CHEST, ABDOMEN AND PELVIS  TECHNIQUE: Multidetector CT imaging through the chest, abdomen and pelvis was performed using the standard protocol during bolus administration of intravenous contrast. Multiplanar reconstructed images and MIPs were obtained and reviewed to evaluate the vascular anatomy.  CONTRAST:  100 mL OMNIPAQUE IOHEXOL 350 MG/ML SOLN  COMPARISON:  CT chest 03/14/2013.  FINDINGS: CTA CHEST FINDINGS  There is no aortic dissection or aneurysm. No pulmonary embolus is identified. The patient has a bovine type aortic arch. Heart size is normal. Stent in the LAD is noted. No pleural or pericardial effusion. The lungs demonstrate some dependent atelectatic change. No focal bony abnormality is identified.  Review of the MIP images confirms the above findings.  CTA ABDOMEN AND PELVIS FINDINGS  There is no aortic dissection or aneurysm. Major branch vessels of the aorta are widely patent. Scattered, mild calcific aortic atherosclerosis is noted. A 1.7 cm cyst is noted in the left hepatic lobe. The liver is otherwise unremarkable. The gallbladder, adrenal glands, spleen, pancreas and right kidney appear normal. A 4.3 cm left renal cyst is incidentally noted. No lymphadenopathy or fluid is identified. The stomach, small and large bowel and appendix appear normal. No lytic or sclerotic bony lesion is seen.  Review of the MIP images confirms the above findings.  IMPRESSION: Negative for aortic dissection or aneurysm. No acute finding chest, abdomen or pelvis.  Mild abdominal aortic atherosclerosis.   Electronically Signed   By: Drusilla Kanner M.D.   On: 09/20/2014 22:21   Ct Cta Abd/pel W/cm &/or W/o Cm  09/20/2014   CLINICAL DATA:  Mid chest pain for 2 days with  diaphoresis and shortness of breath.  EXAM: CT ANGIOGRAPHY CHEST, ABDOMEN AND PELVIS  TECHNIQUE: Multidetector CT imaging through the chest, abdomen and pelvis was performed using the standard protocol during bolus administration of intravenous contrast. Multiplanar reconstructed images and MIPs were obtained and reviewed to evaluate the vascular anatomy.  CONTRAST:  100 mL OMNIPAQUE IOHEXOL 350 MG/ML SOLN  COMPARISON:  CT chest 03/14/2013.  FINDINGS: CTA CHEST FINDINGS  There is no aortic dissection or aneurysm. No pulmonary embolus is identified. The patient has a bovine type aortic arch. Heart size is normal. Stent in the LAD is noted. No pleural or pericardial effusion. The lungs demonstrate some dependent atelectatic change. No focal  bony abnormality is identified.  Review of the MIP images confirms the above findings.  CTA ABDOMEN AND PELVIS FINDINGS  There is no aortic dissection or aneurysm. Major branch vessels of the aorta are widely patent. Scattered, mild calcific aortic atherosclerosis is noted. A 1.7 cm cyst is noted in the left hepatic lobe. The liver is otherwise unremarkable. The gallbladder, adrenal glands, spleen, pancreas and right kidney appear normal. A 4.3 cm left renal cyst is incidentally noted. No lymphadenopathy or fluid is identified. The stomach, small and large bowel and appendix appear normal. No lytic or sclerotic bony lesion is seen.  Review of the MIP images confirms the above findings.  IMPRESSION: Negative for aortic dissection or aneurysm. No acute finding chest, abdomen or pelvis.  Mild abdominal aortic atherosclerosis.   Electronically Signed   By: Drusilla Kanner M.D.   On: 09/20/2014 22:21     EKG Interpretation   Date/Time:  Wednesday September 20 2014 17:43:10 EST Ventricular Rate:  52 PR Interval:  144 QRS Duration: 84 QT Interval:  430 QTC Calculation: 399 R Axis:   91 Text Interpretation:  Sinus bradycardia Right atrial enlargement Rightward  axis Pulmonary  disease pattern Minimal voltage criteria for LVH, may be  normal variant Abnormal ECG No significant change was found Confirmed by  Manus Gunning  MD, Zuleyma Scharf [phone]) on 09/20/2014 6:47:54 PM      MDM   Final diagnoses:  Chest pain   Patient with 2 day history of substernal chest pain associated with shortness of breath and diaphoresis. It is constant and unchanged. Patient's been noncompliant with his medications. EKG shows stable diffuse ST elevation.  Troponin is negative. D-dimer is negative. Patient with history of PE and noncompliance with anticoagulation. Right arm blood pressure 152/89, left arm blood pressure 133/110.  Discussed with cardiology who will consult feels  medical admission and appropriate.  CT negative for PE or dissection. Chest pain has improved. Cardiology has seen patient and feels his pain is likely noncardiac but recommends observation and rule out.  Glynn Octave, MD 09/20/14 2325

## 2014-09-21 ENCOUNTER — Encounter (HOSPITAL_COMMUNITY): Payer: Self-pay | Admitting: General Practice

## 2014-09-21 DIAGNOSIS — Z981 Arthrodesis status: Secondary | ICD-10-CM | POA: Diagnosis not present

## 2014-09-21 DIAGNOSIS — R0782 Intercostal pain: Secondary | ICD-10-CM

## 2014-09-21 DIAGNOSIS — R079 Chest pain, unspecified: Secondary | ICD-10-CM | POA: Diagnosis present

## 2014-09-21 DIAGNOSIS — Z79899 Other long term (current) drug therapy: Secondary | ICD-10-CM | POA: Diagnosis not present

## 2014-09-21 DIAGNOSIS — E785 Hyperlipidemia, unspecified: Secondary | ICD-10-CM

## 2014-09-21 DIAGNOSIS — Z9119 Patient's noncompliance with other medical treatment and regimen: Secondary | ICD-10-CM | POA: Diagnosis present

## 2014-09-21 DIAGNOSIS — I6521 Occlusion and stenosis of right carotid artery: Secondary | ICD-10-CM | POA: Diagnosis present

## 2014-09-21 DIAGNOSIS — R0789 Other chest pain: Secondary | ICD-10-CM

## 2014-09-21 DIAGNOSIS — Z8249 Family history of ischemic heart disease and other diseases of the circulatory system: Secondary | ICD-10-CM | POA: Diagnosis not present

## 2014-09-21 DIAGNOSIS — R001 Bradycardia, unspecified: Secondary | ICD-10-CM | POA: Diagnosis not present

## 2014-09-21 DIAGNOSIS — Z823 Family history of stroke: Secondary | ICD-10-CM | POA: Diagnosis not present

## 2014-09-21 DIAGNOSIS — Z888 Allergy status to other drugs, medicaments and biological substances status: Secondary | ICD-10-CM | POA: Diagnosis not present

## 2014-09-21 DIAGNOSIS — Z7901 Long term (current) use of anticoagulants: Secondary | ICD-10-CM

## 2014-09-21 DIAGNOSIS — I2510 Atherosclerotic heart disease of native coronary artery without angina pectoris: Secondary | ICD-10-CM

## 2014-09-21 DIAGNOSIS — M5030 Other cervical disc degeneration, unspecified cervical region: Secondary | ICD-10-CM | POA: Diagnosis present

## 2014-09-21 DIAGNOSIS — F1290 Cannabis use, unspecified, uncomplicated: Secondary | ICD-10-CM | POA: Diagnosis present

## 2014-09-21 DIAGNOSIS — F329 Major depressive disorder, single episode, unspecified: Secondary | ICD-10-CM | POA: Diagnosis present

## 2014-09-21 DIAGNOSIS — Z7982 Long term (current) use of aspirin: Secondary | ICD-10-CM | POA: Diagnosis not present

## 2014-09-21 DIAGNOSIS — Z9114 Patient's other noncompliance with medication regimen: Secondary | ICD-10-CM

## 2014-09-21 DIAGNOSIS — F1490 Cocaine use, unspecified, uncomplicated: Secondary | ICD-10-CM | POA: Diagnosis present

## 2014-09-21 DIAGNOSIS — I252 Old myocardial infarction: Secondary | ICD-10-CM | POA: Diagnosis not present

## 2014-09-21 DIAGNOSIS — Z8673 Personal history of transient ischemic attack (TIA), and cerebral infarction without residual deficits: Secondary | ICD-10-CM | POA: Diagnosis not present

## 2014-09-21 DIAGNOSIS — G4733 Obstructive sleep apnea (adult) (pediatric): Secondary | ICD-10-CM | POA: Diagnosis present

## 2014-09-21 DIAGNOSIS — I63231 Cerebral infarction due to unspecified occlusion or stenosis of right carotid arteries: Secondary | ICD-10-CM

## 2014-09-21 DIAGNOSIS — Z955 Presence of coronary angioplasty implant and graft: Secondary | ICD-10-CM | POA: Diagnosis not present

## 2014-09-21 DIAGNOSIS — F17210 Nicotine dependence, cigarettes, uncomplicated: Secondary | ICD-10-CM | POA: Diagnosis present

## 2014-09-21 DIAGNOSIS — Z86711 Personal history of pulmonary embolism: Secondary | ICD-10-CM | POA: Diagnosis not present

## 2014-09-21 DIAGNOSIS — I25119 Atherosclerotic heart disease of native coronary artery with unspecified angina pectoris: Secondary | ICD-10-CM | POA: Diagnosis not present

## 2014-09-21 LAB — CBC
HCT: 39.4 % (ref 39.0–52.0)
Hemoglobin: 13.6 g/dL (ref 13.0–17.0)
MCH: 31.7 pg (ref 26.0–34.0)
MCHC: 34.5 g/dL (ref 30.0–36.0)
MCV: 91.8 fL (ref 78.0–100.0)
PLATELETS: 249 10*3/uL (ref 150–400)
RBC: 4.29 MIL/uL (ref 4.22–5.81)
RDW: 13.6 % (ref 11.5–15.5)
WBC: 7 10*3/uL (ref 4.0–10.5)

## 2014-09-21 LAB — PROTIME-INR
INR: 1.07 (ref 0.00–1.49)
Prothrombin Time: 14 seconds (ref 11.6–15.2)

## 2014-09-21 LAB — TROPONIN I: Troponin I: <0.03 ng/mL (ref <0.031)

## 2014-09-21 LAB — HEPARIN LEVEL (UNFRACTIONATED): Heparin Unfractionated: 0.54 IU/mL (ref 0.30–0.70)

## 2014-09-21 MED ORDER — ASPIRIN 81 MG PO CHEW: 81.0000 mg | CHEWABLE_TABLET | Freq: Every day | ORAL | Status: DC

## 2014-09-21 MED ORDER — WARFARIN VIDEO
Freq: Once | Status: AC
  Administered 2014-09-21: 12:00:00

## 2014-09-21 MED ORDER — COUMADIN BOOK
Freq: Once | Status: AC
  Administered 2014-09-21: 08:00:00
  Filled 2014-09-21 (×2): qty 1

## 2014-09-21 MED ORDER — WARFARIN SODIUM 7.5 MG PO TABS
7.5000 mg | ORAL_TABLET | Freq: Once | ORAL | Status: AC
  Administered 2014-09-21: 7.5 mg via ORAL
  Filled 2014-09-21: qty 1

## 2014-09-21 MED ORDER — ENOXAPARIN SODIUM 80 MG/0.8ML ~~LOC~~ SOLN
80.0000 mg | Freq: Two times a day (BID) | SUBCUTANEOUS | Status: DC
  Administered 2014-09-21 – 2014-09-22 (×3): 80 mg via SUBCUTANEOUS
  Filled 2014-09-21 (×4): qty 0.8

## 2014-09-21 MED ORDER — ENOXAPARIN SODIUM 80 MG/0.8ML ~~LOC~~ SOLN: 80.0000 mg | Freq: Two times a day (BID) | SUBCUTANEOUS | Status: DC

## 2014-09-21 MED ORDER — WARFARIN SODIUM 7.5 MG PO TABS: 7.5000 mg | ORAL_TABLET | Freq: Once | ORAL | Status: DC

## 2014-09-21 NOTE — Progress Notes (Signed)
INITIAL NUTRITION ASSESSMENT  DOCUMENTATION CODES Per approved criteria  -Non-severe (moderate) malnutrition in the context of chronic illness  Pt meets criteria for non-severe (moderate) malnutrition in the context of chronic illness as evidenced by <75% energy intake for >1 month, and mild body fat and muscle mass depletion.  INTERVENTION: Provide Magic Cup BID, each provides 290 kcal, 9 grams of protein  NUTRITION DIAGNOSIS: Inadequate oral intake related to decreased appetite as evidenced by poor PO intake for >1 month.   Goal: Pt to meet >/= 90% of estimated energy requirements   Monitor:  PO intake, weight, labs  Reason for Assessment: Pt identified due to Malnutrition Screening Tool (MST) report  54 y.o. male  Admitting Dx: Chest pain  ASSESSMENT: 54 y/o male with history of CAD, NSTEMI, pulmonary embolism, presented with one-day history of chest discomfort. Admission in February 2015, pt suffered an acute right MCA stroke; workup revealed 50% narrowing of the right ICA. Pt has been noncompliant with taking warfarin for 3 months.   Labs and medications- high creatinine  Pt confirmed wt loss and decreased appetite for over a month. He reported appetite comes and goes, and he picks at his food as a result. Pt stated usual wt is 190 lb at last admission. He would like to gain more weight, and has been feeling fatigued. Pt denies any nausea, vomiting, or abdominal pain.  Pt's appetite is good at this time, with 100% PO intake. He is uninterested in trying Ensure or [employer]. Pt stated he is lactose intolerant, but would like to try Magic Cup BID. Follow up with tolerance.  Nutrition Focused Physical Exam:  Subcutaneous Fat:  Orbital Region: WDL Upper Arm Region: mild depletion Thoracic and Lumbar Region: WDL  Muscle:  Temple Region: WDL Clavicle Bone Region: mild depletion Clavicle and Acromion Bone Region: mild depletion Scapular Bone Region: WDL Dorsal Hand:  WDL Patellar Region: mild depletion Anterior Thigh Region: mild depletion Posterior Calf Region: WDL  Edema: not present  Height: Ht Readings from Last 1 Encounters:  09/20/14  (1.93 m)    Weight: Wt Readings from Last 1 Encounters:  09/21/14 170 lb 10.2 oz (77.4 kg)    Ideal Body Weight: 202 lb (91.8 kg)  % Ideal Body Weight: 84%  Wt Readings from Last 10 Encounters:  09/21/14 170 lb 10.2 oz (77.4 kg)  10/20/13 200 lb (90.719 kg)  10/10/13 196 lb 9.6 oz (89.177 kg)  09/22/13 191 lb (86.637 kg)  07/20/13 200 lb 9.9 oz (91 kg)  04/25/13 207 lb 1.6 oz (93.94 kg)  03/15/13 193 lb 9 oz (87.8 kg)  01/11/13 187 lb 6.4 oz (85.004 kg)  12/22/12 186 lb (84.369 kg)  12/17/12 181 lb (82.101 kg)    Usual Body Weight: 190 lb  % Usual Body Weight: 89.5%  BMI:  Body mass index is 20.78 kg/(m^2). normal  Estimated Nutritional Needs: Kcal: 1950-2050 Protein: 100-115 grams Fluid: >/= 1.8L daily  Skin: intact  Diet Order: Diet Heart  EDUCATION NEEDS: -No education needs identified at this time   Intake/Output Summary (Last 24 hours) at 09/21/14 0950 Last data filed at 09/21/14 0558  Gross per 24 hour  Intake  537.5 ml  Output    100 ml  Net  437.5 ml    Last BM: PTA  Labs:   Recent Labs Lab 09/20/14 1750  NA 139  K 4.0  CL 108  CO2 27  BUN 14  CREATININE 1.41*  CALCIUM 8.8  GLUCOSE 86  CBG (last 3)  No results for input(s): GLUCAP in the last 72 hours.  Scheduled Meds: . aspirin  81 mg Oral Daily  . atorvastatin  80 mg Oral q1800  . coumadin book   Does not apply Once  . lisinopril  5 mg Oral Daily  . metoprolol tartrate  12.5 mg Oral BID  . mometasone-formoterol  2 puff Inhalation BID  . warfarin   Does not apply Once  . Warfarin - Pharmacist Dosing Inpatient   Does not apply q1800    Continuous Infusions: . heparin 1,500 Units/hr (09/20/14 2344)    Past Medical History  Diagnosis Date  . Depression   . Degenerative disc  disease, cervical     Dx years ago  . CAD (coronary artery disease) 02/2012    NSTEMI - PCI to prox LAD  . Presence of drug coated stent in LAD coronary artery 02/2012    Promus Element 4.0 mm x 28 mm  . MI (myocardial infarction) 02/02/2012    Normal 2D Echo - EF-60  . Hematoma, s/p cath -rt wrist 12/22/2012  . Chest pain, stable cardiac cath 12/2012 12/22/2012  . NSTEMI (non-ST elevated myocardial infarction), 07/18/13, stable cath with patent stent. 01/31/2012  . Chronic anticoagulation, since 03/2013 for PE 07/19/2013  . Stroke     Past Surgical History  Procedure Laterality Date  . Tumor removal      left foot third toe  . Anterior cervical decomp/discectomy fusion  11/28/2011    Procedure: ANTERIOR CERVICAL DECOMPRESSION/DISCECTOMY FUSION 1 LEVEL;  Surgeon: Temple PaciniHenry A Pool, MD;  Location: [facility];  Service: Neurosurgery;  Laterality: N/A;  Anterior Cervical Six-Seven Decompression and Fusion  . Cardiac catheterization  01/31/2012    Promeus Element DES 4 x 28, balloon-Emerge monorail  . Cardiac catheterization  12/17/12    patent stent, stable CAD  . Left heart catheterization with coronary angiogram N/A 01/31/2012    Procedure: LEFT HEART CATHETERIZATION WITH CORONARY ANGIOGRAM;  Surgeon: Runell GessJonathan J Berry, MD;  [facility];  Service: Cardiovascular;  Laterality: N/A;  . Percutaneous coronary stent intervention (pci-s) N/A 02/02/2012    Procedure: PERCUTANEOUS CORONARY STENT INTERVENTION (PCI-S);  Surgeon: Marykay Lexavid W Harding, MD;  [facility];  Service: Cardiovascular;  Laterality: N/A;  . Left heart catheterization with coronary angiogram N/A 12/17/2012    Procedure: LEFT HEART CATHETERIZATION WITH CORONARY ANGIOGRAM;  Surgeon: Marykay Lexavid W Harding, MD;  [facility];  Service: Cardiovascular;  Laterality: N/A;  . Left heart catheterization with coronary angiogram N/A 07/18/2013    Procedure: LEFT HEART CATHETERIZATION WITH CORONARY ANGIOGRAM;  Surgeon: Marykay Lexavid W Harding,  MD;  [facility];  Service: Cardiovascular;  Laterality: N/A;    Cristela FeltElisa Karoline Fleer, MS Dietetic Intern Pager: [phone]

## 2014-09-21 NOTE — Discharge Instructions (Addendum)

## 2014-09-21 NOTE — Progress Notes (Signed)
PROGRESS NOTE  Timothy BosworthByron G Peck ZOX:[id number] DOB: 08/24/1960 DOA: 09/20/2014 PCP: [name], [name], MD  Brief history 54 year old male with a history of coronary artery disease, NSTEMI, pulmonary embolism presented with one-day history of chest discomfort. The patient had a NSTEMI July 2013 with PCI to the proximal LAD. The patient complained of some chest discomfort without any diaphoresis, nausea, dizziness. EKG shows sinus rhythm with LVH. Troponins are negative. In August 2014, the patient suffered a pulmonary embolus for which he was placed on rivaroxaban. There was questionable compliance with rivaroxaban due to its prohibitive cost. During an admission in February 2015, the patient suffered an acute right MCA stroke. Workup revealed 50% narrowing of the right ICA. There was concern for embolic origin of the stroke. The patient was started on warfarin at that time and instructed to start aspirin. The patient has been noncompliant. He has not seen a physician for over 6 months. He states he has not taken his warfarin for 3 months. Assessment/Plan: Atypical chest pain with history of NSTEMI -Continue aspirin -appreciate cardiology consult -echo -am lipid panel -Restart metoprolol tartrate and lisinopril -CTangio chest negative for pulmonary embolus or dissection -CT angiogram abdomen and pelvis negative for acute findings History of pulmonary embolus and stroke -Restart warfarin with Lovenox bridge -Discontinue heparin Dyslipidemia -Continue statin CKD stage II -Baseline creatinine 1.3-1.6 Tobacco abuse -Patient continues to smoke -40-pack-year history -Tobacco cessation discussed Medical noncompliance -discussed importance of medical followup with patient   Family Communication:   Pt at beside Disposition Plan:   Home when medically stable     Procedures/Studies: Dg Chest 2 View  09/20/2014   CLINICAL DATA:  Left-sided chest pain. Shortness of breath. Coronary  artery disease.  EXAM: CHEST  2 VIEW  COMPARISON:  07/18/2013, 03/13/2013, 01/31/2012, 04/09/2011 and 12/10/2009  FINDINGS: Heart size and pulmonary vascularity are normal. There is tortuosity of the thoracic aorta. Density overlying the right seventh costovertebral junction is felt to be an overlap of osseous structures and appears essentially unchanged since 12/10/2009.  The lungs are clear.  No effusions.  IMPRESSION: No active cardiopulmonary disease.   Electronically Signed   By: Francene BoyersJames  Maxwell M.D.   On: 09/20/2014 19:09   Ct Angio Chest Aorta W/cm &/or Wo/cm  09/20/2014   CLINICAL DATA:  Mid chest pain for 2 days with diaphoresis and shortness of breath.  EXAM: CT ANGIOGRAPHY CHEST, ABDOMEN AND PELVIS  TECHNIQUE: Multidetector CT imaging through the chest, abdomen and pelvis was performed using the standard protocol during bolus administration of intravenous contrast. Multiplanar reconstructed images and MIPs were obtained and reviewed to evaluate the vascular anatomy.  CONTRAST:  100 mL OMNIPAQUE IOHEXOL 350 MG/ML SOLN  COMPARISON:  CT chest 03/14/2013.  FINDINGS: CTA CHEST FINDINGS  There is no aortic dissection or aneurysm. No pulmonary embolus is identified. The patient has a bovine type aortic arch. Heart size is normal. Stent in the LAD is noted. No pleural or pericardial effusion. The lungs demonstrate some dependent atelectatic change. No focal bony abnormality is identified.  Review of the MIP images confirms the above findings.  CTA ABDOMEN AND PELVIS FINDINGS  There is no aortic dissection or aneurysm. Major branch vessels of the aorta are widely patent. Scattered, mild calcific aortic atherosclerosis is noted. A 1.7 cm cyst is noted in the left hepatic lobe. The liver is otherwise unremarkable. The gallbladder, adrenal glands, spleen, pancreas and right kidney appear normal. A 4.3 cm left  renal cyst is incidentally noted. No lymphadenopathy or fluid is identified. The stomach, small and large  bowel and appendix appear normal. No lytic or sclerotic bony lesion is seen.  Review of the MIP images confirms the above findings.  IMPRESSION: Negative for aortic dissection or aneurysm. No acute finding chest, abdomen or pelvis.  Mild abdominal aortic atherosclerosis.   Electronically Signed   By: Drusilla Kanner M.D.   On: 09/20/2014 22:21   Ct Cta Abd/pel W/cm &/or W/o Cm  09/20/2014   CLINICAL DATA:  Mid chest pain for 2 days with diaphoresis and shortness of breath.  EXAM: CT ANGIOGRAPHY CHEST, ABDOMEN AND PELVIS  TECHNIQUE: Multidetector CT imaging through the chest, abdomen and pelvis was performed using the standard protocol during bolus administration of intravenous contrast. Multiplanar reconstructed images and MIPs were obtained and reviewed to evaluate the vascular anatomy.  CONTRAST:  100 mL OMNIPAQUE IOHEXOL 350 MG/ML SOLN  COMPARISON:  CT chest 03/14/2013.  FINDINGS: CTA CHEST FINDINGS  There is no aortic dissection or aneurysm. No pulmonary embolus is identified. The patient has a bovine type aortic arch. Heart size is normal. Stent in the LAD is noted. No pleural or pericardial effusion. The lungs demonstrate some dependent atelectatic change. No focal bony abnormality is identified.  Review of the MIP images confirms the above findings.  CTA ABDOMEN AND PELVIS FINDINGS  There is no aortic dissection or aneurysm. Major branch vessels of the aorta are widely patent. Scattered, mild calcific aortic atherosclerosis is noted. A 1.7 cm cyst is noted in the left hepatic lobe. The liver is otherwise unremarkable. The gallbladder, adrenal glands, spleen, pancreas and right kidney appear normal. A 4.3 cm left renal cyst is incidentally noted. No lymphadenopathy or fluid is identified. The stomach, small and large bowel and appendix appear normal. No lytic or sclerotic bony lesion is seen.  Review of the MIP images confirms the above findings.  IMPRESSION: Negative for aortic dissection or aneurysm. No  acute finding chest, abdomen or pelvis.  Mild abdominal aortic atherosclerosis.   Electronically Signed   By: Drusilla Kanner M.D.   On: 09/20/2014 22:21         Subjective: Pt continues to c/o chest discomfort and sob.  Denies any n/v/d, dizzness, dysuria, abdominal pain, syncope  Objective: Filed Vitals:   09/20/14 2245 09/20/14 2300 09/21/14 0020 09/21/14 0511  BP: 145/90  124/90 133/77  Pulse: 43  59 50  Temp:   98.2 F (36.8 C) 98 F (36.7 C)  TempSrc:   Oral Oral  Resp:   20 20  Height:   (1.93 m)    Weight:  90.7 kg (199 lb 15.3 oz) 77.4 kg (170 lb 10.2 oz)   SpO2: 100%  100%     Intake/Output Summary (Last 24 hours) at 09/21/14 0945 Last data filed at 09/21/14 0558  Gross per 24 hour  Intake  537.5 ml  Output    100 ml  Net  437.5 ml   Weight change:  Exam:   General:  Pt is alert, follows commands appropriately, not in acute distress  HEENT: No icterus, No thrush,  [place]/AT  Cardiovascular: RRR, S1/S2, no rubs, no gallops  Respiratory: CTA bilaterally, no wheezing, no crackles, no rhonchi  Abdomen: Soft/+BS, non tender, non distended, no guarding  Extremities: No edema, No lymphangitis, No petechiae, No rashes, no synovitis  Data Reviewed: Basic Metabolic Panel:  Recent Labs Lab 09/20/14 1750  NA 139  K 4.0  CL 108  CO2 27  GLUCOSE 86  BUN 14  CREATININE 1.41*  CALCIUM 8.8   Liver Function Tests: No results for input(s): AST, ALT, ALKPHOS, BILITOT, PROT, ALBUMIN in the last 168 hours. No results for input(s): LIPASE, AMYLASE in the last 168 hours. No results for input(s): AMMONIA in the last 168 hours. CBC:  Recent Labs Lab 09/20/14 1750 09/21/14 0523  WBC 5.8 7.0  HGB 15.3 13.6  HCT 44.6 39.4  MCV 92.1 91.8  PLT 268 249   Cardiac Enzymes:  Recent Labs Lab 09/20/14 1750 09/20/14 2314 09/21/14 0523  TROPONINI <0.03 <0.03 <0.03   BNP: Invalid input(s): POCBNP CBG: No results for input(s): GLUCAP in the last 168  hours.  No results found for this or any previous visit (from the past 240 hour(s)).   Scheduled Meds: . aspirin  81 mg Oral Daily  . atorvastatin  80 mg Oral q1800  . coumadin book   Does not apply Once  . lisinopril  5 mg Oral Daily  . metoprolol tartrate  12.5 mg Oral BID  . mometasone-formoterol  2 puff Inhalation BID  . warfarin   Does not apply Once  . Warfarin - Pharmacist Dosing Inpatient   Does not apply q1800   Continuous Infusions: . heparin 1,500 Units/hr (09/20/14 2344)     Viann Nielson, DO  [facility] Pager [phone]  If 7PM-7AM, please contact night-coverage [email or web address] Password [facility] 09/21/2014, 9:45 AM

## 2014-09-21 NOTE — Progress Notes (Signed)
ANTICOAGULATION CONSULT NOTE - Follow Up Consult  Pharmacy Consult for Heparin->Lovenox, Coumadin Indication: hx PE (03/2013) and stroke (09/2013)  Allergies  Allergen Reactions  . Ibuprofen Other (See Comments)    Doctor told him he could not take    Patient Measurements: Height: 6\' 4"  (193 cm) Weight: 170 lb 10.2 oz (77.4 kg) IBW/kg (Calculated) : 86.8  Vital Signs: Temp: 98.1 F (36.7 C) (02/18 1021) Temp Source: Oral (02/18 1021) BP: 129/85 mmHg (02/18 1021) Pulse Rate: 57 (02/18 1021)  Labs:  Recent Labs  09/20/14 1750 09/20/14 1901 09/20/14 2314 09/21/14 0523  HGB 15.3  --   --  13.6  HCT 44.6  --   --  39.4  PLT 268  --   --  249  LABPROT  --  13.8  --  14.0  INR  --  1.05  --  1.07  HEPARINUNFRC  --   --   --  0.54  CREATININE 1.41*  --   --   --   TROPONINI <0.03  --  <0.03 <0.03    Estimated Creatinine Clearance: 67.9 mL/min (by C-G formula based on Cr of 1.41).  Assessment:    54yo male c/o CP x2d associated w/ SOB and diaphoresis, has h/o NSTEMI, CVA, and PE; had been on Xarelto for PE which was changed to Coumadin after stroke and R ICA stenosis though has been noncompliant for about a year with all meds; now to resume anticoagulation; noted that CT negative for new PE and troponin currently negative.   Heparin changing to Lovenox today.  Coumadin begun with 10 mg x 1 ~1am.  INR 1.07.  Will give today's Coumadin dose at 10pm rather than usual 6pm administration time.   Discussed with patient. He has Coumadin book and will watch the video.  He is familiar with precautions, monitoring, and potential drug-drug and drug-food interactions.    Per last Coumadin note (10/11/13) - was to take 7.5 mg alternating with 3.75 mg.  Goal of Therapy:  INR 2-3 Anti-Xa level 0.6-1 units/ml 4hrs after LMWH dose given Monitor platelets by anticoagulation protocol: Yes   Plan:   Heparin drip stopped.    Lovenox 80 mg sq q12hrs to begin ~1 hr after stopping heparin.   Lovenox could change to 120 mg sq daily, if he goes home on Lovenox.    Coumadin 7.5 mg x 1 at 10pm tonight.  Daily PT/INR.  Changed am bmet to cmet to check LFTs.  Dennie FettersEgan, Zina Pitzer [place], [place] Pager: [phone] 09/21/2014,11:00 AM

## 2014-09-21 NOTE — Progress Notes (Signed)
Subjective: No further pain  Objective: Vital signs in last 24 hours: Temp:  [97.7 F (36.5 C)-98.2 F (36.8 C)] 98.1 F (36.7 C) (02/18 1021) Pulse Rate:  [34-59] 57 (02/18 1021) Resp:  [10-27] 18 (02/18 1021) BP: (124-158)/(77-115) 129/85 mmHg (02/18 1021) SpO2:  [96 %-100 %] 99 % (02/18 1021) Weight:  [170 lb 10.2 oz (77.4 kg)-199 lb 15.3 oz (90.7 kg)] 170 lb 10.2 oz (77.4 kg) (02/18 0020) Weight change:  Last BM Date: 09/20/14 Intake/Output from previous day: +437 02/17 0701 - 02/18 0700 In: 537.5 [P.O.:444; I.V.:93.5] Out: 100 [Urine:100] Intake/Output this shift: Total I/O In: 660 [P.O.:600; I.V.:60] Out: 700 [Urine:700]  PE: General:Pleasant affect, NAD Skin:Warm and dry, brisk capillary refill HEENT:normocephalic, sclera clear, mucus membranes moist Heart:S1S2 RRR without murmur, gallup, rub or click Lungs:clear without rales, rhonchi, or wheezes ZOX:WRUE, non tender, + BS, do not palpate liver spleen or masses Ext:no lower ext edema, 2+ pedal pulses, 2+ radial pulses Neuro:alert and oriented, MAE, follows commands, + facial symmetry  Tele: SR to SB  Lab Results:  Recent Labs  09/20/14 1750 09/21/14 0523  WBC 5.8 7.0  HGB 15.3 13.6  HCT 44.6 39.4  PLT 268 249   BMET  Recent Labs  09/20/14 1750  NA 139  K 4.0  CL 108  CO2 27  GLUCOSE 86  BUN 14  CREATININE 1.41*  CALCIUM 8.8    Recent Labs  09/20/14 2314 09/21/14 0523  TROPONINI <0.03 <0.03    Lab Results  Component Value Date   CHOL 154 09/22/2013   HDL 43 09/22/2013   LDLCALC 92 09/22/2013   TRIG 93 09/22/2013   CHOLHDL 3.6 09/22/2013   Lab Results  Component Value Date   HGBA1C 5.6 09/22/2013     Lab Results  Component Value Date   TSH 0.645 09/22/2013    Hepatic Function Panel No results for input(s): PROT, ALBUMIN, AST, ALT, ALKPHOS, BILITOT, BILIDIR, IBILI in the last 72 hours. No results for input(s): CHOL in the last 72 hours. No results for  input(s): PROTIME in the last 72 hours.     Studies/Results: Dg Chest 2 View  09/20/2014   CLINICAL DATA:  Left-sided chest pain. Shortness of breath. Coronary artery disease.  EXAM: CHEST  2 VIEW  COMPARISON:  07/18/2013, 03/13/2013, 01/31/2012, 04/09/2011 and 12/10/2009  FINDINGS: Heart size and pulmonary vascularity are normal. There is tortuosity of the thoracic aorta. Density overlying the right seventh costovertebral junction is felt to be an overlap of osseous structures and appears essentially unchanged since 12/10/2009.  The lungs are clear.  No effusions.  IMPRESSION: No active cardiopulmonary disease.   Electronically Signed   By: Francene Boyers M.D.   On: 09/20/2014 19:09   Ct Angio Chest Aorta W/cm &/or Wo/cm  09/20/2014   CLINICAL DATA:  Mid chest pain for 2 days with diaphoresis and shortness of breath.  EXAM: CT ANGIOGRAPHY CHEST, ABDOMEN AND PELVIS  TECHNIQUE: Multidetector CT imaging through the chest, abdomen and pelvis was performed using the standard protocol during bolus administration of intravenous contrast. Multiplanar reconstructed images and MIPs were obtained and reviewed to evaluate the vascular anatomy.  CONTRAST:  100 mL OMNIPAQUE IOHEXOL 350 MG/ML SOLN  COMPARISON:  CT chest 03/14/2013.  FINDINGS: CTA CHEST FINDINGS  There is no aortic dissection or aneurysm. No pulmonary embolus is identified. The patient has a bovine type aortic arch. Heart size is normal. Stent in the LAD is noted. No  pleural or pericardial effusion. The lungs demonstrate some dependent atelectatic change. No focal bony abnormality is identified.  Review of the MIP images confirms the above findings.  CTA ABDOMEN AND PELVIS FINDINGS  There is no aortic dissection or aneurysm. Major branch vessels of the aorta are widely patent. Scattered, mild calcific aortic atherosclerosis is noted. A 1.7 cm cyst is noted in the left hepatic lobe. The liver is otherwise unremarkable. The gallbladder, adrenal glands,  spleen, pancreas and right kidney appear normal. A 4.3 cm left renal cyst is incidentally noted. No lymphadenopathy or fluid is identified. The stomach, small and large bowel and appendix appear normal. No lytic or sclerotic bony lesion is seen.  Review of the MIP images confirms the above findings.  IMPRESSION: Negative for aortic dissection or aneurysm. No acute finding chest, abdomen or pelvis.  Mild abdominal aortic atherosclerosis.   Electronically Signed   By: Drusilla Kannerhomas  Dalessio M.D.   On: 09/20/2014 22:21   Ct Cta Abd/pel W/cm &/or W/o Cm  09/20/2014   CLINICAL DATA:  Mid chest pain for 2 days with diaphoresis and shortness of breath.  EXAM: CT ANGIOGRAPHY CHEST, ABDOMEN AND PELVIS  TECHNIQUE: Multidetector CT imaging through the chest, abdomen and pelvis was performed using the standard protocol during bolus administration of intravenous contrast. Multiplanar reconstructed images and MIPs were obtained and reviewed to evaluate the vascular anatomy.  CONTRAST:  100 mL OMNIPAQUE IOHEXOL 350 MG/ML SOLN  COMPARISON:  CT chest 03/14/2013.  FINDINGS: CTA CHEST FINDINGS  There is no aortic dissection or aneurysm. No pulmonary embolus is identified. The patient has a bovine type aortic arch. Heart size is normal. Stent in the LAD is noted. No pleural or pericardial effusion. The lungs demonstrate some dependent atelectatic change. No focal bony abnormality is identified.  Review of the MIP images confirms the above findings.  CTA ABDOMEN AND PELVIS FINDINGS  There is no aortic dissection or aneurysm. Major branch vessels of the aorta are widely patent. Scattered, mild calcific aortic atherosclerosis is noted. A 1.7 cm cyst is noted in the left hepatic lobe. The liver is otherwise unremarkable. The gallbladder, adrenal glands, spleen, pancreas and right kidney appear normal. A 4.3 cm left renal cyst is incidentally noted. No lymphadenopathy or fluid is identified. The stomach, small and large bowel and appendix  appear normal. No lytic or sclerotic bony lesion is seen.  Review of the MIP images confirms the above findings.  IMPRESSION: Negative for aortic dissection or aneurysm. No acute finding chest, abdomen or pelvis.  Mild abdominal aortic atherosclerosis.   Electronically Signed   By: Drusilla Kannerhomas  Dalessio M.D.   On: 09/20/2014 22:21    Medications: I have reviewed the patient's current medications. Scheduled Meds: . aspirin  81 mg Oral Daily  . atorvastatin  80 mg Oral q1800  . coumadin book   Does not apply Once  . enoxaparin (LOVENOX) injection  80 mg Subcutaneous Q12H  . lisinopril  5 mg Oral Daily  . metoprolol tartrate  12.5 mg Oral BID  . mometasone-formoterol  2 puff Inhalation BID  . warfarin  7.5 mg Oral Once  . Warfarin - Pharmacist Dosing Inpatient   Does not apply q1800   Continuous Infusions:  PRN Meds:.acetaminophen, ondansetron (ZOFRAN) IV  Assessment/Plan: 1. Atypical Chest Pain- 54 y.o. male significant for NSTEMI in July 2013 with PCI to prox LAD at that time, subsequently found to be patent when he presented with recurrent chest pain several months later. In august 2014 he  presetned again with chest pain and was found to have bilateral lower lobe PE and was started on anticoagulation. He presents to the ED today with pressure like chest pain along the left side of his chest, starting yesterday, but worse this afternoon. Occurs at rest worse with inspiration worse with movement from side to side. Has been off meds for 1 month including xarelto and ASA. Now on coumadin- INR 1.07 today  Stable cardiac cath 12/2012  - His chest pain is atypical, and consistent with chest wall pain with reproducible pain on palpation and inspiration - DDimer is negative, first troponin is negative. -negative CT angio for PE, neg aortic dissection - Given his history it is reasonable to observe him overnight to rule out MI.- all troponins neg.   EKG abnormal with early repol but stable  - Social  work for home medication support. He has follow up with Dr. Ranae Palms 10/03/14       Active Problems:   Dyslipidemia, goal LDL below 70- on lipitor 80 now was on no meds on admit    Chronic anticoagulation, since 03/2013 for PE- was off meds now coumadin started.   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  HR down to 45 at night.       Mena Pauls was placed on BB yesterday, may need to stop- in the past he has borderline bradycardia. Per Dr. Ranae Palms note.      Time spent with pt. : 15 minutes. [facility] R  Nurse Practitioner Certified Pager [phone] or after 5pm and on weekends call [phone] 09/21/2014, 12:45 PM

## 2014-09-21 NOTE — Progress Notes (Signed)
UR completed 

## 2014-09-21 NOTE — Progress Notes (Signed)
ANTICOAGULATION CONSULT NOTE - Follow Up Consult  Pharmacy Consult for heparin Indication: chest pain/ACS and h/o PE  Labs:  Recent Labs  09/20/14 1750 09/20/14 1901 09/20/14 2314 09/21/14 0523  HGB 15.3  --   --  13.6  HCT 44.6  --   --  39.4  PLT 268  --   --  249  LABPROT  --  13.8  --  14.0  INR  --  1.05  --  1.07  HEPARINUNFRC  --   --   --  0.54  CREATININE 1.41*  --   --   --   TROPONINI <0.03  --  <0.03  --      Assessment/Plan:  54yo male therapeutic on heparin with initial dosing for CP and Coumadin non-compliance. Will continue gtt at current rate and confirm stable with additional level.   Vernard GamblesVeronda Rise Traeger, PharmD, BCPS  09/21/2014,7:09 AM

## 2014-09-21 NOTE — Discharge Summary (Signed)
Physician Discharge Summary  Timothy Peck:[id number] DOB: February 22, 1961 DOA: 09/20/2014  PCP: Timothy Lex, MD  Admit date: 09/20/2014 Discharge date: 09/22/2014  Recommendations for Outpatient Follow-up:  1. Pt will need to follow up with PCP in 2 weeks post discharge 2. Please obtain BMP and CBC in one week 3. Please check INR on 09/25/14 and adjust coumadin accordingly for INR 2-3  Discharge Diagnoses:  Atypical chest pain with history of NSTEMI -Continue aspirin -appreciate cardiology consult--felt that the chest pain was atypical and did not need any further workup at this time -echo--not performed as pt wanted to leave before it was done.  Cardiology did not feel this was necessary -Restart metoprolol tartrate and lisinopril -However, patient became bradycardic in 30-40s and metoprolol tartrate was discontinued. -CTangio chest negative for pulmonary embolus or dissection -CT angiogram abdomen and pelvis negative for acute findings -EKG findings were consistent with early repolarization -He has follow up with Timothy Peck 10/03/14 -Stable cardiac cath 12/2012 History of pulmonary embolus and stroke -Restart warfarin with Lovenox bridge -Discontinue heparin -Patient will go home with Lovenox bridge. He was instructed to get his INR checked on 09/25/2014. -with the assistance of care management, pt was set up with followup appt at [facility] on 09/25/14 Dyslipidemia -Continue statin CKD stage II -Baseline creatinine 1.3-1.6 Tobacco abuse -Patient continues to smoke -40-pack-year history -Tobacco cessation discussed Medical noncompliance -discussed importance of medical followup with patient  Discharge Condition: stable  Disposition: home Follow-up Information    Follow up with  [facility]    .   Why:  09/25/14 for INR check   Contact information:   [address] [place]  [address] [phone]      Diet:heart healthy Wt Readings from Last 3 Encounters:  09/22/14 77.338 kg (170 lb 8 oz)  10/20/13 90.719 kg (200 lb)  10/10/13 89.177 kg (196 lb 9.6 oz)    History of present illness:  54 year old male with a history of coronary artery disease, NSTEMI, pulmonary embolism presented with one-day history of chest discomfort. The patient had a NSTEMI July 2013 with PCI to the proximal LAD. The patient complained of some chest discomfort without any diaphoresis, nausea, dizziness. EKG shows sinus rhythm with LVH. Troponins are negative. In August 2014, the patient suffered a pulmonary embolus for which he was placed on rivaroxaban. There was questionable compliance with rivaroxaban due to its prohibitive cost. During an admission in February 2015, the patient suffered an acute right MCA stroke. Workup revealed 50% narrowing of the right ICA. There was concern for embolic origin of the stroke. The patient was started on warfarin at that time and instructed to start aspirin. The patient has been noncompliant. He has not seen a physician for over 6 months. He states he has not taken his warfarin for 3 months.    Consultants: cardiology  Discharge Exam: Filed Vitals:   09/22/14 0952  BP: 133/77  Pulse: 60  Temp: 98.8 F (37.1 C)  Resp: 20   Filed Vitals:   09/21/14 1500 09/21/14 2130 09/22/14 0604 09/22/14 0952  BP: 115/62 118/84 116/77 133/77  Pulse: 90 50 67 60  Temp: 97.3 F (36.3 C) 98.1 F (36.7 C) 98 F (36.7 C) 98.8 F (37.1 C)  TempSrc: Oral Oral Oral Oral  Resp: Height:      Weight:   77.338 kg (170 lb 8 oz)   SpO2:  98% 100% 100% 100%   General: A&O x 3, NAD, pleasant, cooperative Cardiovascular: RRR, no rub, no gallop, no S3 Respiratory: CTAB, no wheeze, no rhonchi Abdomen:soft, nontender, nondistended, positive bowel sounds Extremities: No edema, No lymphangitis, no petechiae  Discharge Instructions      Discharge  Instructions    Diet - low sodium heart healthy    Complete by:  As directed      Discharge instructions    Complete by:  As directed   Patient may take his prescriptions to [facility] to get filled due to his insurance and financial difficulty     Increase activity slowly    Complete by:  As directed             Medication List    STOP taking these medications        aspirin EC 325 MG tablet  Replaced by:  aspirin 81 MG chewable tablet     metoprolol tartrate 12.5 mg Tabs tablet  Commonly known as:  LOPRESSOR      TAKE these medications        aspirin 81 MG chewable tablet  Chew 1 tablet (81 mg total) by mouth daily.     atorvastatin 80 MG tablet  Commonly known as:  LIPITOR  Take 1 tablet (80 mg total) by mouth daily at 6 PM.     enoxaparin 80 MG/0.8ML injection  Commonly known as:  LOVENOX  Inject 0.8 mLs (80 mg total) into the skin every 12 (twelve) hours.     lisinopril 5 MG tablet  Commonly known as:  PRINIVIL,ZESTRIL  Take 1 tablet (5 mg total) by mouth daily.     mometasone-formoterol 100-5 MCG/ACT Aero  Commonly known as:  DULERA  Inhale 2 puffs into the lungs 2 (two) times daily.     warfarin 7.5 MG tablet  Commonly known as:  COUMADIN  Take 1 tablet (7.5 mg total) by mouth once.         The results of significant diagnostics from this hospitalization (including imaging, microbiology, ancillary and laboratory) are listed below for reference.    Significant Diagnostic Studies: Dg Chest 2 View  09/20/2014   CLINICAL DATA:  Left-sided chest pain. Shortness of breath. Coronary artery disease.  EXAM: CHEST  2 VIEW  COMPARISON:  07/18/2013, 03/13/2013, 01/31/2012, 04/09/2011 and 12/10/2009  FINDINGS: Heart size and pulmonary vascularity are normal. There is tortuosity of the thoracic aorta. Density overlying the right seventh costovertebral junction is felt to be an overlap of osseous structures and appears essentially unchanged since  12/10/2009.  The lungs are clear.  No effusions.  IMPRESSION: No active cardiopulmonary disease.   Electronically Signed   By: Francene Boyers M.D.   On: 09/20/2014 19:09   Ct Angio Chest Aorta W/cm &/or Wo/cm  09/20/2014   CLINICAL DATA:  Mid chest pain for 2 days with diaphoresis and shortness of breath.  EXAM: CT ANGIOGRAPHY CHEST, ABDOMEN AND PELVIS  TECHNIQUE: Multidetector CT imaging through the chest, abdomen and pelvis was performed using the standard protocol during bolus administration of intravenous contrast. Multiplanar reconstructed images and MIPs were obtained and reviewed to evaluate the vascular anatomy.  CONTRAST:  100 mL OMNIPAQUE IOHEXOL 350 MG/ML SOLN  COMPARISON:  CT chest 03/14/2013.  FINDINGS: CTA CHEST FINDINGS  There is no aortic dissection or aneurysm. No pulmonary embolus is identified. The patient has a bovine type aortic arch. Heart size is normal. Stent in the LAD is noted. No pleural  or pericardial effusion. The lungs demonstrate some dependent atelectatic change. No focal bony abnormality is identified.  Review of the MIP images confirms the above findings.  CTA ABDOMEN AND PELVIS FINDINGS  There is no aortic dissection or aneurysm. Major branch vessels of the aorta are widely patent. Scattered, mild calcific aortic atherosclerosis is noted. A 1.7 cm cyst is noted in the left hepatic lobe. The liver is otherwise unremarkable. The gallbladder, adrenal glands, spleen, pancreas and right kidney appear normal. A 4.3 cm left renal cyst is incidentally noted. No lymphadenopathy or fluid is identified. The stomach, small and large bowel and appendix appear normal. No lytic or sclerotic bony lesion is seen.  Review of the MIP images confirms the above findings.  IMPRESSION: Negative for aortic dissection or aneurysm. No acute finding chest, abdomen or pelvis.  Mild abdominal aortic atherosclerosis.   Electronically Signed   By: Drusilla Kanner M.D.   On: 09/20/2014 22:21   Ct Cta  Abd/pel W/cm &/or W/o Cm  09/20/2014   CLINICAL DATA:  Mid chest pain for 2 days with diaphoresis and shortness of breath.  EXAM: CT ANGIOGRAPHY CHEST, ABDOMEN AND PELVIS  TECHNIQUE: Multidetector CT imaging through the chest, abdomen and pelvis was performed using the standard protocol during bolus administration of intravenous contrast. Multiplanar reconstructed images and MIPs were obtained and reviewed to evaluate the vascular anatomy.  CONTRAST:  100 mL OMNIPAQUE IOHEXOL 350 MG/ML SOLN  COMPARISON:  CT chest 03/14/2013.  FINDINGS: CTA CHEST FINDINGS  There is no aortic dissection or aneurysm. No pulmonary embolus is identified. The patient has a bovine type aortic arch. Heart size is normal. Stent in the LAD is noted. No pleural or pericardial effusion. The lungs demonstrate some dependent atelectatic change. No focal bony abnormality is identified.  Review of the MIP images confirms the above findings.  CTA ABDOMEN AND PELVIS FINDINGS  There is no aortic dissection or aneurysm. Major branch vessels of the aorta are widely patent. Scattered, mild calcific aortic atherosclerosis is noted. A 1.7 cm cyst is noted in the left hepatic lobe. The liver is otherwise unremarkable. The gallbladder, adrenal glands, spleen, pancreas and right kidney appear normal. A 4.3 cm left renal cyst is incidentally noted. No lymphadenopathy or fluid is identified. The stomach, small and large bowel and appendix appear normal. No lytic or sclerotic bony lesion is seen.  Review of the MIP images confirms the above findings.  IMPRESSION: Negative for aortic dissection or aneurysm. No acute finding chest, abdomen or pelvis.  Mild abdominal aortic atherosclerosis.   Electronically Signed   By: Drusilla Kanner M.D.   On: 09/20/2014 22:21     Microbiology: No results found for this or any previous visit (from the past 240 hour(s)).   Labs: Basic Metabolic Panel:  Recent Labs Lab 09/20/14 1750 09/22/14 0540  NA 139 139  K  4.0 3.6  CL 108 111  CO2 27 23  GLUCOSE 86 86  BUN 14 14  CREATININE 1.41* 1.49*  CALCIUM 8.8 8.3*   Liver Function Tests:  Recent Labs Lab 09/22/14 0540  AST 23  ALT 25  ALKPHOS 45  BILITOT 0.5  PROT 5.2*  ALBUMIN 3.1*   No results for input(s): LIPASE, AMYLASE in the last 168 hours. No results for input(s): AMMONIA in the last 168 hours. CBC:  Recent Labs Lab 09/20/14 1750 09/21/14 0523 09/22/14 0540  WBC 5.8 7.0 7.5  HGB 15.3 13.6 13.8  HCT 44.6 39.4 39.8  MCV 92.1 91.8  89.4  PLT 268 249 233   Cardiac Enzymes:  Recent Labs Lab 09/20/14 1750 09/20/14 2314 09/21/14 0523  TROPONINI <0.03 <0.03 <0.03   BNP: Invalid input(s): POCBNP CBG: No results for input(s): GLUCAP in the last 168 hours.  Time coordinating discharge:  Greater than 30 minutes  Signed:  Ree Alcalde, DO [facility] Pager: [phone] 09/22/2014, 10:15 AM

## 2014-09-22 ENCOUNTER — Telehealth: Payer: Self-pay | Admitting: Emergency Medicine

## 2014-09-22 ENCOUNTER — Telehealth: Payer: Self-pay | Admitting: Cardiology

## 2014-09-22 DIAGNOSIS — Z8673 Personal history of transient ischemic attack (TIA), and cerebral infarction without residual deficits: Secondary | ICD-10-CM

## 2014-09-22 DIAGNOSIS — E440 Moderate protein-calorie malnutrition: Secondary | ICD-10-CM

## 2014-09-22 DIAGNOSIS — R079 Chest pain, unspecified: Secondary | ICD-10-CM

## 2014-09-22 LAB — CBC
HEMATOCRIT: 39.8 % (ref 39.0–52.0)
Hemoglobin: 13.8 g/dL (ref 13.0–17.0)
MCH: 31 pg (ref 26.0–34.0)
MCHC: 34.7 g/dL (ref 30.0–36.0)
MCV: 89.4 fL (ref 78.0–100.0)
PLATELETS: 233 10*3/uL (ref 150–400)
RBC: 4.45 MIL/uL (ref 4.22–5.81)
RDW: 13.1 % (ref 11.5–15.5)
WBC: 7.5 10*3/uL (ref 4.0–10.5)

## 2014-09-22 LAB — COMPREHENSIVE METABOLIC PANEL
ALBUMIN: 3.1 g/dL — AB (ref 3.5–5.2)
ALT: 25 U/L (ref 0–53)
ANION GAP: 5 (ref 5–15)
AST: 23 U/L (ref 0–37)
Alkaline Phosphatase: 45 U/L (ref 39–117)
BUN: 14 mg/dL (ref 6–23)
CHLORIDE: 111 mmol/L (ref 96–112)
CO2: 23 mmol/L (ref 19–32)
Calcium: 8.3 mg/dL — ABNORMAL LOW (ref 8.4–10.5)
Creatinine, Ser: 1.49 mg/dL — ABNORMAL HIGH (ref 0.50–1.35)
GFR calc Af Amer: 61 mL/min — ABNORMAL LOW (ref >90)
GFR calc non Af Amer: 53 mL/min — ABNORMAL LOW (ref >90)
Glucose, Bld: 86 mg/dL (ref 70–99)
POTASSIUM: 3.6 mmol/L (ref 3.5–5.1)
Sodium: 139 mmol/L (ref 135–145)
TOTAL PROTEIN: 5.2 g/dL — AB (ref 6.0–8.3)
Total Bilirubin: 0.5 mg/dL (ref 0.3–1.2)

## 2014-09-22 LAB — PROTIME-INR
INR: 1.21 (ref 0.00–1.49)
Prothrombin Time: 15.4 seconds — ABNORMAL HIGH (ref 11.6–15.2)

## 2014-09-22 MED ORDER — ATORVASTATIN CALCIUM 80 MG PO TABS
80.0000 mg | ORAL_TABLET | Freq: Every day | ORAL | Status: DC
Start: 2014-09-22 — End: 2015-01-03

## 2014-09-22 MED ORDER — LISINOPRIL 5 MG PO TABS: 5.0000 mg | ORAL_TABLET | Freq: Every day | ORAL | Status: DC

## 2014-09-22 MED ORDER — ATORVASTATIN CALCIUM 80 MG PO TABS: 80.0000 mg | ORAL_TABLET | Freq: Every day | ORAL | Status: DC

## 2014-09-22 MED ORDER — WARFARIN SODIUM 7.5 MG PO TABS: 7.5000 mg | ORAL_TABLET | Freq: Once | ORAL | Status: DC

## 2014-09-22 NOTE — Telephone Encounter (Signed)
Spoke to pt case manager, Raynelle FanningJulie Pt has scheduled appt with Dr. Orpah CobbAdvani 10/02/14 HFU On Lovenox, will need PT/INR prior to appt Monday

## 2014-09-22 NOTE — Progress Notes (Signed)
Discharge instructions completed. Patient verbalizes understanding of med regimen. [facility] to see about getting scripts filled. Demonstrated lovenox injection administration to patient. Pt verbalizes understanding. States that he gives his fiance's father insulin injections at home. Bus pass was given to patient from social worker being that patient has no ride home.

## 2014-09-22 NOTE — Care Management Note (Signed)
    Page 1 of 1   09/22/2014     4:39:13 PM CARE MANAGEMENT NOTE 09/22/2014  Patient:  Doristine BosworthWHITE,Kenry G   Account Number:  [id number]  Date Initiated:  09/22/2014  Documentation initiated by:  Lakasha Mcfall  Subjective/Objective Assessment:   Pt adm on 09/20/14 with chest pain.  PTA, pt resides at home with fiance.     Action/Plan:   Pt for dc home today.  [facility]   Anticipated DC Date:  09/22/2014   Anticipated DC Plan:  HOME/SELF CARE  In-house referral  Clinical Social Worker      DC Planning Services  CM consult      Choice offered to / List presented to:             Status of service:  Completed, signed off Medicare Important Message given?  NO (If response is "NO", the following Medicare IM given date fields will be blank) Date Medicare IM given:   Medicare IM given by:   Date Additional Medicare IM given:   Additional Medicare IM given by:    Discharge Disposition:  HOME/SELF CARE  Per UR Regulation:  Reviewed for med. necessity/level of care/duration of stay  If discussed at Long Length of Stay Meetings, dates discussed:    Comments:  09/22/14 Sidney AceJulie Jameson Tormey, RN, BSN [phone] Pt for dc home today on Lovenox.  Bedside RN instructed pt on self-injection of Lovenox.  [facility] at 10:00 for PT/INR draw and then on Fri, 2/26 at 3:00pm.  CSW consulted for transportation issues.

## 2014-09-22 NOTE — Telephone Encounter (Signed)
Raynelle FanningJulie case manager is calling to see if we can make an appt for a pt who needs coumadin, when the pt has not been seen by a pcp yet. He is scheduled to be seen next week.

## 2014-09-25 ENCOUNTER — Ambulatory Visit: Payer: Medicaid Other | Attending: Internal Medicine

## 2014-09-25 DIAGNOSIS — I63239 Cerebral infarction due to unspecified occlusion or stenosis of unspecified carotid arteries: Secondary | ICD-10-CM

## 2014-09-25 DIAGNOSIS — I2699 Other pulmonary embolism without acute cor pulmonale: Secondary | ICD-10-CM

## 2014-09-25 LAB — POCT INR: INR: 1.6

## 2014-09-25 MED ORDER — WARFARIN SODIUM 5 MG PO TABS: 5.0000 mg | ORAL_TABLET | Freq: Every day | ORAL | Status: DC

## 2014-09-29 ENCOUNTER — Ambulatory Visit: Payer: Medicaid Other | Attending: Internal Medicine | Admitting: Internal Medicine

## 2014-09-29 ENCOUNTER — Encounter: Payer: Self-pay | Admitting: Internal Medicine

## 2014-09-29 VITALS — BP 139/92 | HR 79 | Temp 98.0°F | Resp 16

## 2014-09-29 DIAGNOSIS — Z7901 Long term (current) use of anticoagulants: Secondary | ICD-10-CM | POA: Insufficient documentation

## 2014-09-29 DIAGNOSIS — I63231 Cerebral infarction due to unspecified occlusion or stenosis of right carotid arteries: Secondary | ICD-10-CM

## 2014-09-29 DIAGNOSIS — N289 Disorder of kidney and ureter, unspecified: Secondary | ICD-10-CM | POA: Diagnosis not present

## 2014-09-29 DIAGNOSIS — F329 Major depressive disorder, single episode, unspecified: Secondary | ICD-10-CM | POA: Diagnosis not present

## 2014-09-29 DIAGNOSIS — E785 Hyperlipidemia, unspecified: Secondary | ICD-10-CM | POA: Insufficient documentation

## 2014-09-29 DIAGNOSIS — I8290 Acute embolism and thrombosis of unspecified vein: Secondary | ICD-10-CM

## 2014-09-29 DIAGNOSIS — Z833 Family history of diabetes mellitus: Secondary | ICD-10-CM | POA: Diagnosis not present

## 2014-09-29 DIAGNOSIS — I749 Embolism and thrombosis of unspecified artery: Secondary | ICD-10-CM | POA: Insufficient documentation

## 2014-09-29 DIAGNOSIS — I2699 Other pulmonary embolism without acute cor pulmonale: Secondary | ICD-10-CM | POA: Insufficient documentation

## 2014-09-29 DIAGNOSIS — J45909 Unspecified asthma, uncomplicated: Secondary | ICD-10-CM | POA: Diagnosis not present

## 2014-09-29 DIAGNOSIS — I252 Old myocardial infarction: Secondary | ICD-10-CM | POA: Diagnosis not present

## 2014-09-29 DIAGNOSIS — Z79899 Other long term (current) drug therapy: Secondary | ICD-10-CM | POA: Diagnosis not present

## 2014-09-29 DIAGNOSIS — F17210 Nicotine dependence, cigarettes, uncomplicated: Secondary | ICD-10-CM | POA: Diagnosis not present

## 2014-09-29 DIAGNOSIS — Z7982 Long term (current) use of aspirin: Secondary | ICD-10-CM | POA: Insufficient documentation

## 2014-09-29 DIAGNOSIS — I2510 Atherosclerotic heart disease of native coronary artery without angina pectoris: Secondary | ICD-10-CM | POA: Diagnosis not present

## 2014-09-29 DIAGNOSIS — Z720 Tobacco use: Secondary | ICD-10-CM

## 2014-09-29 DIAGNOSIS — F17200 Nicotine dependence, unspecified, uncomplicated: Secondary | ICD-10-CM

## 2014-09-29 DIAGNOSIS — Z8673 Personal history of transient ischemic attack (TIA), and cerebral infarction without residual deficits: Secondary | ICD-10-CM

## 2014-09-29 DIAGNOSIS — Z8709 Personal history of other diseases of the respiratory system: Secondary | ICD-10-CM

## 2014-09-29 LAB — COMPLETE METABOLIC PANEL WITH GFR
ALK PHOS: 50 U/L (ref 39–117)
ALT: 40 U/L (ref 0–53)
AST: 30 U/L (ref 0–37)
Albumin: 3.9 g/dL (ref 3.5–5.2)
BUN: 13 mg/dL (ref 6–23)
CALCIUM: 8.5 mg/dL (ref 8.4–10.5)
CHLORIDE: 111 meq/L (ref 96–112)
CO2: 28 meq/L (ref 19–32)
Creat: 1.25 mg/dL (ref 0.50–1.35)
GFR, EST AFRICAN AMERICAN: 77 mL/min
GFR, Est Non African American: 66 mL/min
Glucose, Bld: 38 mg/dL — CL (ref 70–99)
Potassium: 4.3 mEq/L (ref 3.5–5.3)
Sodium: 146 mEq/L — ABNORMAL HIGH (ref 135–145)
TOTAL PROTEIN: 5.7 g/dL — AB (ref 6.0–8.3)
Total Bilirubin: 0.5 mg/dL (ref 0.2–1.2)

## 2014-09-29 LAB — POCT INR: INR: 1.2

## 2014-09-29 MED ORDER — ENOXAPARIN SODIUM 80 MG/0.8ML ~~LOC~~ SOLN: 80.0000 mg | Freq: Two times a day (BID) | SUBCUTANEOUS | Status: DC

## 2014-09-29 NOTE — Patient Instructions (Signed)
DASH Eating Plan °DASH stands for "Dietary Approaches to Stop Hypertension." The DASH eating plan is a healthy eating plan that has been shown to reduce high blood pressure (hypertension). Additional health benefits may include reducing the risk of type 2 diabetes mellitus, heart disease, and stroke. The DASH eating plan may also help with weight loss. °WHAT DO I NEED TO KNOW ABOUT THE DASH EATING PLAN? °For the DASH eating plan, you will follow these general guidelines: °· Choose foods with a percent daily value for sodium of less than 5% (as listed on the food label). °· Use salt-free seasonings or herbs instead of table salt or sea salt. °· Check with your health care provider or pharmacist before using salt substitutes. °· Eat lower-sodium products, often labeled as "lower sodium" or "no salt added." °· Eat fresh foods. °· Eat more vegetables, fruits, and low-fat dairy products. °· Choose whole grains. Look for the word "whole" as the first word in the ingredient list. °· Choose fish and skinless chicken or turkey more often than red meat. Limit fish, poultry, and meat to 6 oz (170 g) each day. °· Limit sweets, desserts, sugars, and sugary drinks. °· Choose heart-healthy fats. °· Limit cheese to 1 oz (28 g) per day. °· Eat more home-cooked food and less restaurant, buffet, and fast food. °· Limit fried foods. °· Cook foods using methods other than frying. °· Limit canned vegetables. If you do use them, rinse them well to decrease the sodium. °· When eating at a restaurant, ask that your food be prepared with less salt, or no salt if possible. °WHAT FOODS CAN I EAT? °Seek help from a dietitian for individual calorie needs. °Grains °Whole grain or whole wheat bread. Brown rice. Whole grain or whole wheat pasta. Quinoa, bulgur, and whole grain cereals. Low-sodium cereals. Corn or whole wheat flour tortillas. Whole grain cornbread. Whole grain crackers. Low-sodium crackers. °Vegetables °Fresh or frozen vegetables  (raw, steamed, roasted, or grilled). Low-sodium or reduced-sodium tomato and vegetable juices. Low-sodium or reduced-sodium tomato sauce and paste. Low-sodium or reduced-sodium canned vegetables.  °Fruits °All fresh, canned (in natural juice), or frozen fruits. °Meat and Other Protein Products °Ground beef (85% or leaner), grass-fed beef, or beef trimmed of fat. Skinless chicken or turkey. Ground chicken or turkey. Pork trimmed of fat. All fish and seafood. Eggs. Dried beans, peas, or lentils. Unsalted nuts and seeds. Unsalted canned beans. °Dairy °Low-fat dairy products, such as skim or 1% milk, 2% or reduced-fat cheeses, low-fat ricotta or cottage cheese, or plain low-fat yogurt. Low-sodium or reduced-sodium cheeses. °Fats and Oils °Tub margarines without trans fats. Light or reduced-fat mayonnaise and salad dressings (reduced sodium). Avocado. Safflower, olive, or canola oils. Natural peanut or almond butter. °Other °Unsalted popcorn and pretzels. °The items listed above may not be a complete list of recommended foods or beverages. Contact your dietitian for more options. °WHAT FOODS ARE NOT RECOMMENDED? °Grains °Wetsel bread. Michetti pasta. Krauter rice. Refined cornbread. Bagels and croissants. Crackers that contain trans fat. °Vegetables °Creamed or fried vegetables. Vegetables in a cheese sauce. Regular canned vegetables. Regular canned tomato sauce and paste. Regular tomato and vegetable juices. °Fruits °Dried fruits. Canned fruit in light or heavy syrup. Fruit juice. °Meat and Other Protein Products °Fatty cuts of meat. Ribs, chicken wings, bacon, sausage, bologna, salami, chitterlings, fatback, hot dogs, bratwurst, and packaged luncheon meats. Salted nuts and seeds. Canned beans with salt. °Dairy °Whole or 2% milk, cream, half-and-half, and cream cheese. Whole-fat or sweetened yogurt. Full-fat   cheeses or blue cheese. Nondairy creamers and whipped toppings. Processed cheese, cheese spreads, or cheese  curds. °Condiments °Onion and garlic salt, seasoned salt, table salt, and sea salt. Canned and packaged gravies. Worcestershire sauce. Tartar sauce. Barbecue sauce. Teriyaki sauce. Soy sauce, including reduced sodium. Steak sauce. Fish sauce. Oyster sauce. Cocktail sauce. Horseradish. Ketchup and mustard. Meat flavorings and tenderizers. Bouillon cubes. Hot sauce. Tabasco sauce. Marinades. Taco seasonings. Relishes. °Fats and Oils °Butter, stick margarine, lard, shortening, ghee, and bacon fat. Coconut, palm kernel, or palm oils. Regular salad dressings. °Other °Pickles and olives. Salted popcorn and pretzels. °The items listed above may not be a complete list of foods and beverages to avoid. Contact your dietitian for more information. °WHERE CAN I FIND MORE INFORMATION? °National Heart, Lung, and Blood Institute: www.nhlbi.nih.gov/health/health-topics/topics/dash/ °Document Released: 07/10/2011 Document Revised: 12/05/2013 Document Reviewed: 05/25/2013 °ExitCare® Patient Information ©2015 [employer]. This information is not intended to replace advice given to you by your health care provider. Make sure you discuss any questions you have with your health care provider. ° °

## 2014-09-29 NOTE — Progress Notes (Signed)
Patient here for follow up from the hospital Patient has a history of blood clots Takes medications for blood pressure and cholesterol Currently on coumadin and lovenox

## 2014-09-29 NOTE — Progress Notes (Signed)
Patient Demographics  Timothy Peck, is a 54 y.o. male  WUJ:[id number]  NFA:[id number]  DOB - May 10, 1961  CC:  Chief Complaint  Patient presents with  . Hospitalization Follow-up       HPI: Timothy Peck is a 54 y.o. male here today to establish medical care.who has history of CAD non-ST elevation MI pulmonary embolism, recently hospitalized with symptoms of chest discomfort, EMR reviewed patient had negative troponin levels, because of 14 patient had a pulmonary embolism at bedtime he was on rivaroxaban  ? Compliance at the time patient had acute right MCA stroke in 2015, workup showed 50% narrowing of the right ICA, was a concern about embolic origin of the stroke patient was started on Coumadin and there has been questionable compliance, cardiology was on board and chest pain was thought to be a typical, patient was restarted on metoprolol and lisinopril subsequently patient had a low heart rate so the metoprolol was discontinued, CT of the chest negative for pulmonary embolism, since patient has history of pulmonary embolism and stroke he was restarted on Coumadin and with Lovenox bridge patient also has CK D. stage II, patient still smokes cigarettes, for the last 3 days patient has not taken the Coumadin today's INR is subtherapeutic but patient has been taking Lovenox twice a day. Patient is also scheduled to follow with his cardiologist. Patient has No headache, No chest pain, No abdominal pain - No Nausea, No new weakness tingling or numbness, No Cough - SOB.  Allergies  Allergen Reactions  . Ibuprofen Other (See Comments)    Doctor told him he could not take   Past Medical History  Diagnosis Date  . Depression   . Degenerative disc disease, cervical     Dx years ago  . CAD (coronary artery disease) 02/2012    NSTEMI - PCI to prox LAD  . Presence of drug coated stent in LAD coronary artery 02/2012    Promus Element 4.0 mm x 28 mm  . MI (myocardial infarction) 02/02/2012   Normal 2D Echo - EF-60  . Hematoma, s/p cath -rt wrist 12/22/2012  . Chest pain, stable cardiac cath 12/2012 12/22/2012  . NSTEMI (non-ST elevated myocardial infarction), 07/18/13, stable cath with patent stent. 01/31/2012  . Chronic anticoagulation, since 03/2013 for PE 07/19/2013  . Stroke    Current Outpatient Prescriptions on File Prior to Visit  Medication Sig Dispense Refill  . aspirin 81 MG chewable tablet Chew 1 tablet (81 mg total) by mouth daily. 30 tablet 0  . atorvastatin (LIPITOR) 80 MG tablet Take 1 tablet (80 mg total) by mouth daily at 6 PM. (Patient not taking: Reported on 09/20/2014) 30 tablet 0  . atorvastatin (LIPITOR) 80 MG tablet Take 1 tablet (80 mg total) by mouth daily at 6 PM. 30 tablet 1  . lisinopril (PRINIVIL,ZESTRIL) 5 MG tablet Take 1 tablet (5 mg total) by mouth daily. (Patient not taking: Reported on 09/20/2014) 30 tablet 0  . lisinopril (PRINIVIL,ZESTRIL) 5 MG tablet Take 1 tablet (5 mg total) by mouth daily. 30 tablet 1  . mometasone-formoterol (DULERA) 100-5 MCG/ACT AERO Inhale 2 puffs into the lungs 2 (two) times daily. (Patient not taking: Reported on 09/20/2014) 1 Inhaler 0  . warfarin (COUMADIN) 5 MG tablet Take 1 tablet (5 mg total) by mouth daily. 30 tablet 0  . warfarin (COUMADIN) 7.5 MG tablet Take 1 tablet (7.5 mg total) by mouth once. 5 tablet 0   No current facility-administered medications on file prior  to visit.   Family History  Problem Relation Age of Onset  . Anesthesia problems Neg Hx   . Hypotension Neg Hx   . Malignant hyperthermia Neg Hx   . Pseudochol deficiency Neg Hx   . Coronary artery disease Father   . Stroke Father [id number]  . Heart disease Father   . Diabetes Father   . Aneurysm Mother 45    intracranial aneurysm rupture  . Hypertension Mother   . Hypertension Maternal Grandmother   . Diabetes Maternal Grandmother   . Hypertension Paternal Grandmother   . Hypertension Paternal Grandfather   . Cancer Maternal Aunt    History    Social History  . Marital Status: Divorced    Spouse Name: N/A  . Number of Children: N/A  . Years of Education: N/A   Occupational History  . Not on file.   Social History Main Topics  . Smoking status: Current Every Day Smoker -- 1.00 packs/day for 42 years    Types: Cigarettes    Start date: 01/12/1971  . Smokeless tobacco: Never Used  . Alcohol Use: No     Comment: Report being abstinent since ~January 2015  . Drug Use: Yes    Special: Cocaine, Marijuana, Other-see comments     Comment: sts hasnt used cocaine in years. but used marijuana daily.   Marland Kitchen. Sexual Activity: Yes    Birth Control/ Protection: None   Other Topics Concern  . Not on file   Social History Narrative    Review of Systems: Constitutional: Negative for fever, chills, diaphoresis, activity change, appetite change and fatigue. HENT: Negative for ear pain, nosebleeds, congestion, facial swelling, rhinorrhea, neck pain, neck stiffness and ear discharge.  Eyes: Negative for pain, discharge, redness, itching and visual disturbance. Respiratory: Negative for cough, choking, chest tightness, shortness of breath, wheezing and stridor.  Cardiovascular: Negative for chest pain, palpitations and leg swelling. Gastrointestinal: Negative for abdominal distention. Genitourinary: Negative for dysuria, urgency, frequency, hematuria, flank pain, decreased urine volume, difficulty urinating and dyspareunia.  Musculoskeletal: Negative for back pain, joint swelling, arthralgia and gait problem. Neurological: Negative for dizziness, tremors, seizures, syncope, facial asymmetry, speech difficulty, weakness, light-headedness, numbness and headaches.  Hematological: Negative for adenopathy. Does not bruise/bleed easily. Psychiatric/Behavioral: Negative for hallucinations, behavioral problems, confusion, dysphoric mood, decreased concentration and agitation.    Objective:   Filed Vitals:   09/29/14 1535  BP: 139/92  Pulse:  79  Temp: 98 F (36.7 C)  Resp: 16    Physical Exam: Constitutional: Patient appears well-developed and well-nourished. No distress. HENT: Normocephalic, atraumatic, External right and left ear normal. Oropharynx is clear and moist.  Eyes: Conjunctivae and EOM are normal. PERRLA, no scleral icterus. Neck: Normal ROM. Neck supple. No JVD. No tracheal deviation. No thyromegaly. CVS: RRR, S1/S2 +, no murmurs, no gallops, no carotid bruit.  Pulmonary: Effort and breath sounds normal, no stridor, rhonchi, wheezes, rales.  Abdominal: Soft. BS +, no distension, tenderness, rebound or guarding.  Musculoskeletal: Normal range of motion. No edema and no tenderness.  Neuro: Alert. Normal reflexes, muscle tone coordination. No cranial nerve deficit. Skin: Skin is warm and dry. No rash noted. Not diaphoretic. No erythema. No pallor. Psychiatric: Normal mood and affect. Behavior, judgment, thought content normal.  Lab Results  Component Value Date   WBC 7.5 09/22/2014   HGB 13.8 09/22/2014   HCT 39.8 09/22/2014   MCV 89.4 09/22/2014   PLT 233 09/22/2014   Lab Results  Component Value Date   CREATININE  1.49* 09/22/2014   BUN 14 09/22/2014   NA 139 09/22/2014   K 3.6 09/22/2014   CL 111 09/22/2014   CO2 23 09/22/2014    Lab Results  Component Value Date   HGBA1C 5.6 09/22/2013   Lipid Panel     Component Value Date/Time   CHOL 154 09/22/2013 0540   TRIG 93 09/22/2013 0540   HDL 43 09/22/2013 0540   CHOLHDL 3.6 09/22/2013 0540   VLDL 19 09/22/2013 0540   LDLCALC 92 09/22/2013 0540       Assessment and plan:    2. Pulmonary embolism and infarction/1. Clot / History of arterial ischemic stroke Results for orders placed or performed in visit on 09/29/14  INR  Result Value Ref Range   INR 1.2    Patient is subtherapeutic, he will take Coumadin 10 mg Friday Sunday and Tuesday, 5 mg on remaining days, she will continue with Lovenox until next INR check. - enoxaparin (LOVENOX)  80 MG/0.8ML injection; Inject 0.8 mLs (80 mg total) into the skin every 12 (twelve) hours.  Dispense: 14 Syringe; Refill: 0  3. CAD in native artery Patient is currently on statins ACE inhibitor aspirin, scheduled to follow with cardiology.  5. 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 Dyslipidemia, goal LDL below 70 Continue with current dose of Lipitor, will check fasting lipid panel on next visit.  7. Renal insufficiency  - COMPLETE METABOLIC PANEL WITH GFR  8. Smoking Counseled patient to quit smoking  9. History of asthma Symptoms are stable patient has been on Dulera., Albuterol when necessary  10. Family history of diabetes mellitus (DM) Will check - Hemoglobin A1c      Health Maintenance - -Vaccinations:  Up-to-date with  Pneumovax, patient declines flu shot today.  Return in about 3 months (around 12/28/2014) for hypertension, INR check 10/05/2014.    The patient was given clear instructions to go to ER or return to medical center if symptoms don't improve, worsen or new problems develop. The patient verbalized understanding. The patient was told to call to get lab results if they haven't heard anything in the next week.    This note has been created with Education officer, environmental. Any transcriptional errors are unintentional.   Doris Cheadle, MD

## 2014-09-30 ENCOUNTER — Telehealth: Payer: Self-pay | Admitting: Family Medicine

## 2014-09-30 LAB — HEMOGLOBIN A1C
Hgb A1c MFr Bld: 5.5 % (ref <5.7)
MEAN PLASMA GLUCOSE: 111 mg/dL (ref <117)

## 2014-09-30 NOTE — Telephone Encounter (Signed)
Received information for after hours call service about critical lab.  Called patient. He answered. He is alert and talkative. I informed him that his blood sugar was 38 on yesterday's CMP. Critical low. He admits to feeling shaky and sick yesterday but is better today with only mild symptoms.   He is not taking insulin or any oral medications to lower sugars.  He does have a blood sugar meter available to him.  He checked his sugar, result was 109.  Patient admits that he was fasting yesterday for religious purposes.   We were disconnected at this point in the conversation and I was unable to get details about the length of the fast or give instructions. Attempted to call back 2 times, unable to leave VM.   Timothy HanlyStella or Noreene LarssonJill,  Please call back to patient on Monday to advise a shorter fasting period than whatever period he fasted from (eg from 24 hrs to 12-16 hrs) and periodic blood sugar checks during fasting to avoid potentially life-threatening hypoglycemia.

## 2014-10-02 NOTE — Telephone Encounter (Signed)
Left message to return call [employer] # not available

## 2014-10-02 NOTE — Telephone Encounter (Signed)
Not available=[phone]

## 2014-10-03 ENCOUNTER — Telehealth: Payer: Self-pay

## 2014-10-03 ENCOUNTER — Ambulatory Visit (INDEPENDENT_AMBULATORY_CARE_PROVIDER_SITE_OTHER): Payer: Medicaid Other | Admitting: Cardiology

## 2014-10-03 ENCOUNTER — Encounter: Payer: Self-pay | Admitting: Cardiology

## 2014-10-03 VITALS — BP 128/86 | HR 57 | Ht 76.5 in | Wt 176.1 lb

## 2014-10-03 DIAGNOSIS — E785 Hyperlipidemia, unspecified: Secondary | ICD-10-CM

## 2014-10-03 DIAGNOSIS — Z720 Tobacco use: Secondary | ICD-10-CM

## 2014-10-03 DIAGNOSIS — I2699 Other pulmonary embolism without acute cor pulmonale: Secondary | ICD-10-CM

## 2014-10-03 DIAGNOSIS — Z7901 Long term (current) use of anticoagulants: Secondary | ICD-10-CM

## 2014-10-03 DIAGNOSIS — Z9119 Patient's noncompliance with other medical treatment and regimen: Secondary | ICD-10-CM

## 2014-10-03 DIAGNOSIS — I214 Non-ST elevation (NSTEMI) myocardial infarction: Secondary | ICD-10-CM

## 2014-10-03 DIAGNOSIS — I2510 Atherosclerotic heart disease of native coronary artery without angina pectoris: Secondary | ICD-10-CM

## 2014-10-03 DIAGNOSIS — Z91199 Patient's noncompliance with other medical treatment and regimen due to unspecified reason: Secondary | ICD-10-CM

## 2014-10-03 DIAGNOSIS — Z9861 Coronary angioplasty status: Secondary | ICD-10-CM

## 2014-10-03 DIAGNOSIS — R079 Chest pain, unspecified: Secondary | ICD-10-CM

## 2014-10-03 DIAGNOSIS — R001 Bradycardia, unspecified: Secondary | ICD-10-CM

## 2014-10-03 DIAGNOSIS — I63231 Cerebral infarction due to unspecified occlusion or stenosis of right carotid arteries: Secondary | ICD-10-CM

## 2014-10-03 DIAGNOSIS — R0789 Other chest pain: Secondary | ICD-10-CM

## 2014-10-03 NOTE — Telephone Encounter (Signed)
-----   Message from Lora PaulaJosalyn C Funches, MD sent at 10/02/2014  1:38 PM EST ----- Hypoglycemia. Patient aware Addressed over the weekend, see phone notes  Otherwise normal CMP

## 2014-10-03 NOTE — Telephone Encounter (Signed)
Patient is aware of his lab results 

## 2014-10-03 NOTE — Patient Instructions (Signed)
Your physician has requested that you have an exercise tolerance test. For further information please visit [email or web address]. Please also follow instruction sheet, as given.  Exercise Stress Electrocardiogram An exercise stress electrocardiogram is a test to check how blood flows to your heart. It is done to find areas of poor blood flow. You will need to walk on a treadmill for this test. The electrocardiogram will record your heartbeat when you are at rest and when you are exercising. BEFORE THE PROCEDURE  Do not have drinks with caffeine or foods with caffeine for 24 hours before the test, or as told by your doctor. This includes coffee, tea (even decaf tea), sodas, chocolate, and cocoa.  Follow your doctor's instructions about eating and drinking before the test.  Ask your doctor what medicines you should or should not take before the test. Take your medicines with water unless told by your doctor not to.  If you use an inhaler, bring it with you to the test.  Bring a snack to eat after the test.  Do not  smoke for 4 hours before the test.  Do not put lotions, powders, creams, or oils on your chest before the test.  Wear comfortable shoes and clothing. PROCEDURE  You will have patches put on your chest. Small areas of your chest may need to be shaved. Wires will be connected to the patches.  You have a follow up appointment with Dr.Harding after this test.  Your heart rate will be watched while you are resting and while you are exercising.  You will walk on the treadmill. The treadmill will slowly get faster to raise your heart rate.  The test will take about 1-2 hours. AFTER THE PROCEDURE  Your heart rate and blood pressure will be watched after the test.  You may return to your normal diet, activities, and medicines or as told by your doctor. Document Released: 01/07/2008 Document Revised: 12/05/2013 Document Reviewed: 03/28/2013 [facility] Patient Information 2015  [place], [place]. This information is not intended to replace advice given to you by your health care provider. Make sure you discuss any questions you have with your health care provider.

## 2014-10-03 NOTE — Telephone Encounter (Signed)
Patient presented in person for medication request. No information supplied on yellow sheet - unsure what meds needed or if requesting refills or samples.  Attempted to reach patient, phone did not ring and went straight to automated unavailable message w/ no voice mail.

## 2014-10-04 ENCOUNTER — Encounter: Payer: Self-pay | Admitting: Cardiology

## 2014-10-04 DIAGNOSIS — R079 Chest pain, unspecified: Secondary | ICD-10-CM | POA: Insufficient documentation

## 2014-10-04 NOTE — Assessment & Plan Note (Signed)
He had a widely patent stent in 2015. He is now having chest discomfort which I can tell his anginal or not, but it least is moderate risk. Evaluating with Myoview stress test. I am leery of the fact that he is no longer on an antiplatelet agent such as Plavix or Brilinta. A large third generation DES stent should be okay, but I am still concerned for possible ISR. He has not had any heart failure symptoms.

## 2014-10-04 NOTE — Assessment & Plan Note (Signed)
Currently on warfarin with Lovenox bridging. As I initiated the warfarin either for PE or, I will defer to other managing physicians.  I'm not sure that he really needs Lovenox bridging until he is therapeutic INR.

## 2014-10-04 NOTE — Assessment & Plan Note (Signed)
He is very difficult to read. His symptoms do sound somewhat concerning for angina but there are also some very atypical features of it. He has extremely pressurized speech is very difficult getting clear story from him. I think with his history of known CAD and as well as problems with thromboses, I think is reasonable to reassess for either stent reocclusion versus de novo disease. We will check a treadmill Myoview. He will need sublingual nitroglycerin which I thought he had before. He is not on a beta blocker because of bradycardia but is on ACE inhibitor and statin along with aspirin  I will see him back after stress test is completed. At least we know his anatomy and would not be pulled by a false negative or false positive result.

## 2014-10-04 NOTE — Assessment & Plan Note (Signed)
No one seems to follow this up. We may need to repeat visit carotid artery Dopplers. Continue risk factor modification as were doing for his cardiac disease.

## 2014-10-04 NOTE — Progress Notes (Signed)
PCP: Doris Cheadle, MD  Clinic Note: Chief Complaint  Patient presents with  . 30 MONTH VISIT    CHEST PAIN  OF MONTH, SOB , EDEMA    HPI: Timothy Peck is a 54 y.o. male with a PMH below who presents today for long overdue follow-up for CAD status post PCI to the LAD the setting of an MI back in 2013. I last saw him in September 2014. Following his MI he had a number catheterization that showed patent stent. He then subsequently was admitted and found to have bilateral lower lobe PEs. He was put on Xarelto announces subsequently has been put on warfarin. He has now had at least 2 strokes by his report.. The last stroke that I see was from February 2015 - right ICA. It was felt this was possibly from a cardiac source. He presented outside the window for TPA. During that hospital stay his Brilinta was stopped and switched to aspirin. I don't think this was discussed with the cardiology consultation service - bone not unreasonable, preferred option would've been going from Brilinta to Plavix as opposed to aspirin. Apparently there was report of noncompliance with Xarelto.Marland Kitchen He is a history of cocaine abuse but swears now he is not smoking anymore. He is however smoking or tobacco use he also wanted more by his report.  He had hospitalization in mid February for chest pain was felt to be noncardiac in nature. Unfortunately his beta blocker was stopped due to bradycardia 40s. His INR was subtherapeutic so he was placed on bridging Lovenox injections. He is basically here now to follow-up from hospitalization.  Past Medical History  Diagnosis Date  . NSTEMI (non-ST elevated myocardial infarction)     07/18/13, stable cath with patent stent; preserved EF by echo; follow-up Echo for CVA 09/2013: EF 60%, mild LVH. No RDW and a. No cardiac source for embolism noted. Bubble study was not performed  . CAD S/P percutaneous coronary angioplasty 02/2012    NSTEMI - PCI to prox LAD  . Presence of drug  coated stent in LAD coronary artery 02/2012    Promus Element DES 4.0 mm x 28 mm; converted from Brilinta to aspirin during stroke evaluation in February 2015  . NSTEMI (non-ST elevated myocardial infarction), 07/18/13, stable cath with patent stent. 01/31/2012  . Bilateral pulmonary embolism August 2014  . 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 Chronic anticoagulation December 2014    Switch from Xarelto to warfarin.  . Depression   . Polysubstance abuse     Past history of cocaine and marijuana, both smoking. Also long standing tobacco abuse.   . Degenerative disc disease, cervical     Dx years ago    Prior Cardiac Evaluation and Past Surgical History: Past Surgical History  Procedure Laterality Date  . Tumor removal      left foot third toe  . Anterior cervical decomp/discectomy fusion  11/28/2011    Procedure: ANTERIOR CERVICAL DECOMPRESSION/DISCECTOMY FUSION 1 LEVEL;  Surgeon: Temple Pacini, MD;  Location: [facility];  Service: Neurosurgery;  Laterality: N/A;  Anterior Cervical Six-Seven Decompression and Fusion  . Cardiac catheterization  01/31/2012    Promeus Element DES 4 x 28, balloon-Emerge monorail  . Cardiac catheterization  12/17/12    patent stent, stable CAD  . Left heart catheterization with coronary angiogram N/A 01/31/2012    Procedure: LEFT HEART CATHETERIZATION WITH CORONARY ANGIOGRAM;  Surgeon: Runell Gess, MD;  Location: [facility] CATH LAB;  Service: Cardiovascular;  Laterality: N/A;  . Percutaneous coronary stent intervention (pci-s) N/A 02/02/2012    Procedure: PERCUTANEOUS CORONARY STENT INTERVENTION (PCI-S);  Surgeon: Marykay Lex, MD;  Location: [facility] C[facility] LAB;  Service: Cardiovascular;  Laterality: N/A;  . Left heart catheterization with coronary angiogram N/A 12/17/2012    Procedure: LEFT HEART CATHETERIZATION WITH CORONARY ANGIOGRAM;  Surgeon: Marykay Lex, MD;  Location: [facility] CATH LAB;  Service: Cardiovascular;  Laterality: N/A;  . Left heart  catheterization with coronary angiogram N/A 07/18/2013    Procedure: LEFT HEART CATHETERIZATION WITH CORONARY ANGIOGRAM;  Surgeon: Marykay Lex, MD;  Location: [facility] CATH LAB;  Service: Cardiovascular;  Laterality: N/A;    Interval History: Lan presents today now in follow-up from a hospital stay he continues to have complaints of exertional dyspnea and chest pain with or without exertion. He swears that he is taking his medications according to recommendations. He also swears he is stop smoking cocaine but has increased his cigarettes. He is under a lot of stress because he and his wife have now started taking over the care of his father-in-law who has been in rehabilitation recovering from a stroke and requires almost total care.  He denies any rapid or irregular heartbeat/palpitations. He is very pressured with his speech is difficult to get straight answers from him but he doesn't really no signs and symptoms of PND or orthopnea/edema. He denies any syncope/near syncope, TIA/amaurosis fugax symptoms. No melena, hematochezia, hematuria, or epstaxis. No claudication.  ROS: A comprehensive was performed. Review of Systems  Constitutional: Positive for malaise/fatigue.  HENT: Negative for congestion.   Respiratory: Positive for cough, shortness of breath (Mostly exertional, but with less then previously noted.) and wheezing. Negative for sputum production.   Cardiovascular: Negative for claudication.       Noted in history of present illness  Gastrointestinal: Negative for blood in stool and melena.  Genitourinary: Negative for hematuria.  Musculoskeletal:       Chest wall pain  Neurological: Positive for dizziness (positional) and focal weakness (Mild residual weakness from his stroke on the left side). Negative for loss of consciousness.  Endo/Heme/Allergies: Does not bruise/bleed easily.  Psychiatric/Behavioral: Positive for depression. The patient is nervous/anxious.   All other  systems reviewed and are negative.   Current Outpatient Prescriptions on File Prior to Visit  Medication Sig Dispense Refill  . aspirin 81 MG chewable tablet Chew 1 tablet (81 mg total) by mouth daily. 30 tablet 0  . atorvastatin (LIPITOR) 80 MG tablet Take 1 tablet (80 mg total) by mouth daily at 6 PM. 30 tablet 0  . atorvastatin (LIPITOR) 80 MG tablet Take 1 tablet (80 mg total) by mouth daily at 6 PM. 30 tablet 1  . enoxaparin (LOVENOX) 80 MG/0.8ML injection Inject 0.8 mLs (80 mg total) into the skin every 12 (twelve) hours. 14 Syringe 0  . lisinopril (PRINIVIL,ZESTRIL) 5 MG tablet Take 1 tablet (5 mg total) by mouth daily. 30 tablet 0  . lisinopril (PRINIVIL,ZESTRIL) 5 MG tablet Take 1 tablet (5 mg total) by mouth daily. 30 tablet 1  . warfarin (COUMADIN) 5 MG tablet Take 1 tablet (5 mg total) by mouth daily. 30 tablet 0  . warfarin (COUMADIN) 7.5 MG tablet Take 1 tablet (7.5 mg total) by mouth once. 5 tablet 0  . mometasone-formoterol (DULERA) 100-5 MCG/ACT AERO Inhale 2 puffs into the lungs 2 (two) times daily. (Patient not taking: Reported on 09/20/2014) 1 Inhaler 0   No current facility-administered medications on  file prior to visit.   Allergies  Allergen Reactions  . Ibuprofen Other (See Comments)    Doctor told him he could not take    SOCIAL AND FAMILY HISTORY REVIEWED IN EPIC -- he and his wife are now taken over the care of his father-in-law. He threw that he is no longer smoking cocaine and is trying his best to avoid any illicit substances including marijuana. Unfortunately that is causing to increase his smoking. change  Wt Readings from Last 3 Encounters:  10/03/14 176 lb 1.6 oz (79.878 kg)  09/22/14 170 lb 8 oz (77.338 kg)  10/20/13 200 lb (90.719 kg)    PHYSICAL EXAM BP 128/86 mmHg  Pulse 57  Ht 6' 4.5" (1.943 m)  Wt 176 lb 1.6 oz (79.878 kg)  BMI 21.16 kg/m2 General appearance: alert, cooperative, appears stated age, no distress and continues to be more thin  than previously. 24 pounds down from March of last year. Neck: no adenopathy, no carotid bruit and no JVD Lungs: Diffuse mild to moderate bibasilar rhonchorous breath sounds are clear with cough., normal percussion bilaterally and non-labored Heart: regular rate and rhythm, S1 & S2 normal, no murmur, click, rub or gallop; nondisplaced PMI Abdomen: soft, non-tender; bowel sounds normal; no masses,  no organomegaly; Extremities: extremities normal, atraumatic, no cyanosis, and minimal  edema Pulses: 2+ and symmetric;  Neurologic: Mental status: Alert, oriented, thought content appropriate Cranial nerves: normal (II-XII grossly intact)   Adult ECG Report  Rate: 57 ;  Rhythm: sinus bradycardia and Cannot exclude right atrial enlargement.  Narrative Interpretation: Otherwise normal EKG with normal axis and intervals.  Recent Labs:  No lipids checked recently.   ASSESSMENT / PLAN:. Very excitable 54 year old gentleman with mostly single-vessel disease of the large DES stent to the LAD back in June July 2013. Found to be patent by relook catheterization in May 2014. Lots as happened since I last seen him including what seems like 2 strokes and the large PEs. He now is presenting with symptoms of chest pain again with exertion associated with dyspnea. He is a very difficult to obtain history from because he is so excitable. I do know his anatomy and therefore feeling the best course of action at this particular time is to proceed with a noninvasive stress test. We will do a treadmill Myoview in order to see his exercise tolerance. He tells me he is not able ride his bike for more than 50 feet without getting short of breath. I would like to see if he has part of incompetence and evaluated exercise tolerance with a treadmill portion would also like the Myoview imaging.  Chest pain with moderate risk for cardiac etiology He is very difficult to read. His symptoms do sound somewhat concerning for angina  but there are also some very atypical features of it. He has extremely pressurized speech is very difficult getting clear story from him. I think with his history of known CAD and as well as problems with thromboses, I think is reasonable to reassess for either stent reocclusion versus de novo disease. We will check a treadmill Myoview. He will need sublingual nitroglycerin which I thought he had before. He is not on a beta blocker because of bradycardia but is on ACE inhibitor and statin along with aspirin  I will see him back after stress test is completed. At least we know his anatomy and would not be pulled by a false negative or false positive result.   CAD S/P percutaneous  coronary angioplasty 02/21/12 non-STEMI -Promus DES 4.0 mm 20 mm to prox LAD He had a widely patent stent in 2015. He is now having chest discomfort which I can tell his anginal or not, but it least is moderate risk. Evaluating with Myoview stress test. I am leery of the fact that he is no longer on an antiplatelet agent such as Plavix or Brilinta. A large third generation DES stent should be okay, but I am still concerned for possible ISR. He has not had any heart failure symptoms.   NSTEMI (non-ST elevated myocardial infarction), 07/18/13, stable cath with patent stent. No wall motion on echo. Will need to evaluate for potential ischemia in the LAD distribution versus possible infarct. I don't think is symptoms are ACS-type symptoms, but could be exertional anginal symptoms.   Pulmonary embolism and infarction, 03/13/13, on warfarin Currently on warfarin with Lovenox bridging. As I initiated the warfarin either for PE or, I will defer to other managing physicians.  I'm not sure that he really needs Lovenox bridging until he is therapeutic INR.   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 We may need to repeat visit carotid artery Dopplers. Continue risk factor  modification as were doing for his cardiac disease.   Tobacco abuse Unfortunately cc cut out his other substances that he smokes he has increased his smoking of cigarettes. Just because of the nature of the visit today and shows not to go into too much detail smoking cessation but congratulated him on his efforts with stopping smoking crack cocaine which significantly more concerning than the tobacco. Once he is stable for a while I think he will need further counseling on techniques to potentially quit smoking.   Long term current use of anticoagulant therapy I think he is okay waiting until scheduled think it's either of the second and third to get an INR check. He may need to just simply increase his doses for a few days just to get up to a therapeutic level. Defer decision on plus or minus bridging to PCP   Personal history of noncompliance with medical treatment He swears he is taking medications as directed and is no longer smoking cocaine. I instructed the utmost importance of taking medications as indicated.     Orders Placed This Encounter  Procedures  . Myocardial Perfusion Imaging    Standing Status: Future     Number of Occurrences:      Standing Expiration Date: 10/03/2015    Order Specific Question:  Type of stress    Answer:  Treadmill    Order Specific Question:  Patient weight in lbs    Answer:  176  . EKG 12-Lead   No orders of the defined types were placed in this encounter.    Followup: Roughly 1 month   HARDING, Piedad ClimesAVID W, M.D., M.S. Interventional Cardiologist   Pager # [phone]

## 2014-10-04 NOTE — Assessment & Plan Note (Signed)
Unfortunately cc cut out his other substances that he smokes he has increased his smoking of cigarettes. Just because of the nature of the visit today and shows not to go into too much detail smoking cessation but congratulated him on his efforts with stopping smoking crack cocaine which significantly more concerning than the tobacco. Once he is stable for a while I think he will need further counseling on techniques to potentially quit smoking.

## 2014-10-04 NOTE — Assessment & Plan Note (Signed)
I think he is okay waiting until scheduled think it's either of the second and third to get an INR check. He may need to just simply increase his doses for a few days just to get up to a therapeutic level. Defer decision on plus or minus bridging to PCP

## 2014-10-04 NOTE — Assessment & Plan Note (Signed)
He swears he is taking medications as directed and is no longer smoking cocaine. I instructed the utmost importance of taking medications as indicated.

## 2014-10-04 NOTE — Assessment & Plan Note (Signed)
No wall motion on echo. Will need to evaluate for potential ischemia in the LAD distribution versus possible infarct. I don't think is symptoms are ACS-type symptoms, but could be exertional anginal symptoms.

## 2014-10-05 NOTE — Telephone Encounter (Signed)
Attempted to call. Went to automated message that person is unavailable. Unable to leave VM

## 2014-10-09 NOTE — Telephone Encounter (Signed)
LMTCB

## 2014-10-09 NOTE — Telephone Encounter (Signed)
No return call from patient after 2 VMs were left by triage nurses and no information was provided on walk-in sheet about which medications needed to be refilled or what was needed r/t medications. Encounter will be closed.

## 2014-10-13 ENCOUNTER — Encounter (HOSPITAL_COMMUNITY): Payer: Self-pay | Admitting: *Deleted

## 2014-10-19 ENCOUNTER — Other Ambulatory Visit: Payer: Self-pay | Admitting: *Deleted

## 2014-10-19 DIAGNOSIS — I63231 Cerebral infarction due to unspecified occlusion or stenosis of right carotid arteries: Secondary | ICD-10-CM

## 2014-10-19 DIAGNOSIS — R079 Chest pain, unspecified: Secondary | ICD-10-CM

## 2014-10-19 DIAGNOSIS — Z9861 Coronary angioplasty status: Secondary | ICD-10-CM

## 2014-10-19 DIAGNOSIS — I214 Non-ST elevation (NSTEMI) myocardial infarction: Secondary | ICD-10-CM

## 2014-10-19 DIAGNOSIS — I2510 Atherosclerotic heart disease of native coronary artery without angina pectoris: Secondary | ICD-10-CM

## 2014-10-25 ENCOUNTER — Telehealth (HOSPITAL_COMMUNITY): Payer: Self-pay

## 2014-10-25 ENCOUNTER — Telehealth (HOSPITAL_COMMUNITY): Payer: Self-pay | Admitting: *Deleted

## 2014-10-25 NOTE — Telephone Encounter (Signed)
Encounter complete. 

## 2014-10-26 ENCOUNTER — Encounter (HOSPITAL_COMMUNITY): Payer: Medicaid Other

## 2014-11-03 ENCOUNTER — Telehealth: Payer: Self-pay | Admitting: *Deleted

## 2014-11-03 NOTE — Telephone Encounter (Signed)
Unable to leave message because mailbox was full

## 2014-11-03 NOTE — Telephone Encounter (Signed)
Left message for patient to call back. Trying to see if he can change his appointment to either 4/5 at 11:00 or 4/6 at at 1145 with Dr. Roda ShuttersXu instead of his appt. With Dr. Lucia GaskinsAhern on 4/6 at 300 pm. Please ask pt if he calls back, thank you!

## 2014-11-06 ENCOUNTER — Telehealth: Payer: Self-pay | Admitting: *Deleted

## 2014-11-06 NOTE — Telephone Encounter (Signed)
Left message for pt to call back. Gave [facility] phone number and office hours.  

## 2014-11-06 NOTE — Telephone Encounter (Signed)
Unable to leave voicemail because mailbox is full.

## 2014-11-08 ENCOUNTER — Ambulatory Visit: Payer: Medicaid Other | Admitting: Neurology

## 2014-11-08 ENCOUNTER — Ambulatory Visit: Payer: Self-pay | Admitting: Neurology

## 2014-11-09 ENCOUNTER — Encounter: Payer: Self-pay | Admitting: Neurology

## 2014-11-20 ENCOUNTER — Ambulatory Visit: Payer: Medicaid Other | Admitting: Cardiology

## 2014-11-21 NOTE — Consult Note (Signed)
Brief Consult Note: Diagnosis: atypical chest pain, previous NSTEMI in 07/13 with DES to proximal LAD (NSTEMI was in setting to Cocaine use).   Patient was seen by consultant.   Consult note dictated.   Comments: Resume cardiac medications. I stopped Metoprolol due to known history of Cocaine use.  Will review echo today.  If he rules out with no wall motion abnormalites on echo, he can be discharged tomorrow if able to ambulate with ches pain. Otherwise, will need a stress test on Monday.  Electronic Signatures: Lorine BearsArida, Innocence Schlotzhauer (MD)  (Signed 21-Dec-13 14:30)  Authored: Brief Consult Note   Last Updated: 21-Dec-13 14:30 by Lorine BearsArida, Nannette Zill (MD)

## 2014-11-21 NOTE — Discharge Summary (Signed)
PATIENT NAME:  Timothy Peck, Beatrice MR#:  [id number] DATE OF BIRTH:  [birth year]  DATE OF ADMISSION:  07/24/2012 DATE OF DISCHARGE:  07/25/2012  ADMITTING PHYSICIAN: Ramonita LabAruna Gouru, MD  DISCHARGING PHYSICIAN: Enid Baasadhika Jolyssa Oplinger, MD  PRIMARY CARE PHYSICIAN: Currently none.  CONSULTANTS: Eldridge DaceMuhammed Arida, MD - Cardiology.  DISCHARGE DIAGNOSES: 1. Musculoskeletal chest pain. 2. Coronary artery disease.   DISCHARGE HOME MEDICATIONS: 1. Nitroglycerin 0.4 mg sublingual tablet every 5 minutes as needed for chest pain.  2. Aspirin 81 mg p.o. daily.  3. Plavix 75 mg p.o. daily. 4. Atorvastatin 40 mg p.o. daily.  5. Folic acid 1 mg p.o. daily.   DISCHARGE DIET: Low-sodium diet.   DISCHARGE ACTIVITY: As tolerated.   FOLLOWUP INSTRUCTIONS: Cardiology follow-up in 2 weeks.  LABS AND IMAGING STUDIES: WBC is 5.9, hemoglobin 13.3, hematocrit 39.9 and platelet count 205.   Sodium 141, potassium 4.3, chloride 107, bicarbonate 30, BUN 20, creatinine 1.06, glucose 73 and calcium 8.3.  ALT 33, AST 24, alkaline phosphatase 67, total bilirubin 0.3 and albumin 3.8. Troponins negative x 3.   Urinalysis negative for any infection. LDL 48, HDL 55, total cholesterol 045110 and triglycerides 37.   Echo Doppler is showing normal LV systolic function, ejection fraction of 55%, mild concentric LVH is present. Trace tricuspid regurgitation is present.   Chest x-ray on admission showed no acute disease of the chest.  Brief Hospital Course: [name] is a 54 year old African American male with past medical history significant for coronary artery disease status post drug-eluting stent [facility] [place] who has been incarcerated essentially for substance abuse was brought in secondary to chest pain.  Chest pain: Atypical chest pain with muscle soreness and tenderness, likely costochondritis. However, because of his recent stent placement and him being off of Plavix since his incarceration, he was  admitted. Troponins have been negative x 3. Echo did not show any wall motion abnormalities, cardiology consult has been appreciated, and the patient is being discharged on aspirin, Plavix and statin. He is not being discharged on any metoprolol or any other beta blockers due to his recent cocaine use and possible use in the future too. His course has been otherwise uneventful in the hospital. He has remained chest pain free and is being discharged in stable condition. He is handcuffed and be discharged to prison for now.   TIME SPENT ON DISCHARGE: 35 minutes.   ____________________________ Enid Baasadhika Jadd Gasior, MD rk:sb D: 07/25/2012 13:31:55 ET T: 07/26/2012 10:27:41 ET JOB#: [id number]  cc: Enid Baasadhika Keenan Trefry, MD, <Dictator> Enid BaasADHIKA Jayshon Dommer MD ELECTRONICALLY SIGNED 07/28/2012 10:58

## 2014-11-21 NOTE — H&P (Signed)
PATIENT NAME:  Timothy Peck, GEE MR#:  [id number] DATE OF BIRTH:  1961/05/10  DATE OF ADMISSION:  07/24/2012  PRIMARY CARE PHYSICIAN: Nonlocal  CARDIOLOGY: At [facility]   CHIEF COMPLAINT: Chest pain.   HISTORY OF PRESENT ILLNESS: The patient is a 54 year old him African American male with a past medical history of coronary artery disease status post recent stent placement in October, on Plavix, is presenting to the ER with a chief complaint of chest pain. The patient is reporting that he has been experiencing intermittent episodes of chest pain for the past few days, but today he woke up with chest pain which was located in the left side, sharp in nature and radiating to the left side of the shoulder, associated with diaphoresis and shortness of breath. As the pain is getting worse, the patient was brought into the ER. The patient is reporting that he resides in jail, and he was brought into the ER with a sheriff. Cardiac enzymes were negative, and 12-lead EKG did not show any changes. Medical records were requested from [facility] which are still pending at this point of time. The patient has received morphine 2 mg IV in the ER for chest pain. During my examination, the patient is chest pain-free. He denies any shortness of breath or diaphoresis. He denies any other complaints. He denies any abdominal pain, nausea, vomiting, diarrhea.   PAST MEDICAL HISTORY: Coronary artery disease status post stent placement in October.   PAST SURGICAL HISTORY:  1. Stent placement in October.  2. Cervical spine surgery.   ALLERGIES: No known drug allergies.   HOME MEDICATIONS:  Plavix 75 mg p.o. once daily, aspirin 81 mg as reported p.o. once daily   PSYCHOSOCIAL HISTORY: Currently in jail.  Denies any smoking, alcohol, or illicit drug use.  FAMILY HISTORY: Mother deceased with brain hemorrhage at age 51, and dad deceased with heart attack at age 33.   REVIEW OF SYSTEMS:   CONSTITUTIONAL: Denies any fever,  fatigue, weakness, pain, weight loss or weight gain.  EYES: Denies any blurry vision, cataracts, glaucoma, inflammation.  ENT: Denies tinnitus, ear pain, ear discharge, snoring, postnasal drip, difficulty in swallowing.  RESPIRATORY: Denies cough, wheezing, COPD, dyspnea or asthma.  CARDIOVASCULAR: Complaining of chest pain in the left side of the chest. Denies any orthopnea, palpitations, syncope, varicose veins.  GASTROINTESTINAL: Denies nausea, vomiting, diarrhea, abdominal pain, jaundice, rectal bleeding.  GENITOURINARY: Denies dysuria, hematuria, renal calculi.  ENDOCRINOLOGY: Denies polyuria, polyphagia, polydipsia, thyroid problems, increased sweating.  HEMATOLOGIC/LYMPHATIC: Denies anemia or swollen glands.  INTEGUMENTARY: Denies acne, rash, lesions.  MUSCULOSKELETAL: Denies any back pain, neck pain, shoulder pain, swelling.  NEUROLOGICAL: Denies any weakness, ataxia, dementia, seizures, tremors.  PSYCHIATRIC: Denies any anxiety, insomnia, ADD, OCD.   PHYSICAL EXAMINATION:  VITAL SIGNS: Temperature 98 degrees Fahrenheit, pulse 60, respiratory rate 17, blood pressure 115/70, pulse ox 98 to 100% on 2 liters.  GENERAL APPEARANCE: Not in acute distress, moderately built and moderately nourished.  HEENT: Eyes: Normocephalic, atraumatic. Pupils are equally reacting to light and accommodation. No conjunctival injection. No jaundice. Extraocular movements are intact. ENT: Tympanic membranes are intact. No ear discharge. No nasal congestion. No sinus tenderness. No postnasal drip.  NECK: Supple. No JVD. No thyromegaly. No carotid bruits.  LUNGS: Clear to auscultation bilaterally. No crackles. No wheezing. No accessory muscle usage. Reproducible left anterior chest wall tenderness.  CARDIOVASCULAR: S1, S2 normal. Regular rate and rhythm. Point of maximum impulse is intact. Peripheral pulses are 2+. No peripheral edema.  GASTROINTESTINAL: Soft, bowel sounds are positive in all four quadrants,  nontender, nondistended. No masses felt.  NEUROLOGICAL: Awake, alert, and oriented x 3. Reflexes are 2+. Motor and sensory are grossly intact.   SKIN: No lesions. No rashes. No acne.  MUSCULOSKELETAL: No joint effusion, tenderness, swelling.  EXTREMITIES: No edema. No cyanosis. No clubbing.   LABORATORY, DIAGNOSTIC AND RADIOLOGICAL DATA:  A 12-lead EKG: Normal sinus rhythm at 56 beats per minute,  PR interval normal 150 ms, normal QT and corrected  QT interval.   Glucose 73, BUN 20, creatinine 1.06, sodium 141, potassium 4.3, chloride 107, CO2 30, GFR greater than 60, anion gap is 4, serum osmolality 282, calcium 8.3. CK total 118. CPK-MB 1.5. Troponin T less than 0.02. WBC 5.9, hemoglobin 13.3, hematocrit 39.9, platelet count is 205,000.   ASSESSMENT AND PLAN: The patient is a 54 year old African American male who is residing in jail, was brought into the ER with a chief complaint of chest pain. He will be admitted with the following assessment and plan.   1. Atypical chest pain which is reproducible: We will place the patient on observation status. Oxygen via nasal cannula to maintain his pulse ox greater than 93%. ACS protocol. Cycle cardiac biomarkers. Continue aspirin and Plavix.  2. History of coronary artery disease status post stent placement in October 2013: We will continue aspirin and Plavix and trying to get medical [facility]. If cardiac biomarkers are trending up, we will consider cardiology consult at that time.  3. We will provide him GI and DVT prophylaxis.    The plan of care was discussed in detail with the patient.   CODE STATUS: The patient is FULL CODE.     TOTAL TIME SPENT ON THE ADMISSION: 50 minutes.  ____________________________ Ramonita LabAruna Dayla Gasca, MD ag:cb D: 07/24/2012 01:33:15 ET T: 07/24/2012 11:03:21 ET JOB#: [id number]  cc: Ramonita LabAruna Maricella Filyaw, MD, <Dictator> Ramonita LabARUNA Jessa Stinson MD ELECTRONICALLY SIGNED 07/28/2012 1:53

## 2014-11-24 NOTE — Consult Note (Signed)
PATIENT NAME:  Timothy Peck, Timothy Peck MR#:  [id number] DATE OF BIRTH:  1960-09-01  DATE OF CONSULTATION:  07/24/2012  REFERRING PHYSICIAN:  Dr. Amado CoeGouru CONSULTING PHYSICIAN:  Jerolyn CenterMuhammad A. Kirke CorinArida, MD  PRIMARY CARDIOLOGIST:  Dr. Ward Chattersave Harding at [facility] in [place].   REASON FOR CONSULTATION:  Chest pain.   HISTORY OF PRESENT ILLNESS:  This is a 54 year old African-American male with known history of coronary artery disease. He [facility] July [facility] with non-ST elevation myocardial infarction in the setting of excessive alcohol use and cocaine intoxication. His troponin at that time was greater than 20. He underwent cardiac catheterization by Dr. York RamJonathan Barry, which showed an 80% ulcerated plaque in the proximal to mid LAD with a large thrombus. The patient was treated with Integrilin for 48 hours and had relook angiography and subsequently drug-eluting stent placement to the proximal and mid LAD. The patient was incarcerated after his discharge, and has not been able to take his medications on a regular basis. According to the patient, he just started getting his medications recently when he was [facility]. Since his angioplasty, he took cardiac medications including dual antiplatelet therapy only intermittently whenever available. He presented this time with left-sided sharp discomfort mostly at rest. It was sore and tender to touch. He reports that these symptoms were similar, but less intense than his previous myocardial infarction. Cardiac enzymes are negative so far.   PAST MEDICAL HISTORY:  1  Coronary artery disease with non-ST elevation myocardial infarction in July 2013 status post angioplasty and drug-eluting stent placement to the proximal LAD.  2.  History of multidrug abuse including tobacco, alcohol, cocaine and marijuana.   ALLERGIES:  No known drug allergies.   HOME MEDICATIONS:  He is supposed to be on aspirin 81 mg daily,  Plavix 75 mg daily and atorvastatin.   SOCIAL HISTORY:  Remarkable for tobacco, alcohol and drug use including cocaine and marijuana. However, he reports that he quit all of these substances since he has been in jail.   FAMILY HISTORY:  Remarkable for coronary artery disease. His father died at the age of 52 of myocardial infarction.   REVIEW OF SYSTEMS:  A 10-point review of systems was performed. It is negative other than what is mentioned in the HPI.   PHYSICAL EXAMINATION:  GENERAL:  The patient is a thin African-American male, who is in no acute distress. He appears to be at his stated age.  VITAL SIGNS:  Temperature is 97.8, pulse is 55, respiratory rate is 16, blood pressure is 114/76 and oxygen saturation is 99% on room air.  HEENT:  Normocephalic, atraumatic.  NECK:  No JVD or carotid bruits.  RESPIRATORY:  Normal respiratory effort with no use of accessory muscles. Auscultation reveals normal breath sounds.  CARDIOVASCULAR:  Normal PMI. Normal S1 and S2 with no gallops or murmurs. The patient has reproducible chest pain on the left side.  ABDOMEN:  Benign, nontender and nondistended.  EXTREMITIES:  No clubbing, cyanosis or edema.  SKIN:  Warm and dry with no rash.  PSYCHIATRIC:  He is alert, oriented x 3 with normal mood and affect.   LABORATORY AND DIAGNOSTIC DATA:  Cardiac enzymes are negative. The rest of his labs are unremarkable. ECG showed sinus rhythm with ST elevation diffusely consistent with early repolarization, which is similar to his prior ECG. He also has sinus arrhythmia and PVC.   IMPRESSION:  1.  Atypical chest pain.  2.  Known history of coronary artery disease, status post non-ST elevation myocardial infarction with angioplasty and drug-eluting stent placement to the proximal left anterior descending.  3.  Multidrug use, but according to the patient, he quit.   RECOMMENDATIONS:  The patient's chest pain is somewhat atypical and seems to be musculoskeletal and  reproducible. However, he mentioned that these symptoms are similar to his previous myocardial infarction, but less intense. So far, he ruled out for myocardial infarction, and ECG does not show any acute ischemic changes. An echocardiogram was performed and will be reviewed. The main issue with him has been mostly social due to inability to take his cardiac medications on a regular basis. I recommend resuming aspirin 81 mg daily, Plavix 75 mg daily and atorvastatin 20 mg once daily. No beta blocker at this time due to a history of cocaine use. If the patient subsequently rules out for myocardial infarction with no wall motion abnormalities on echocardiogram, he can likely be discharged tomorrow from the hospital if he is able to ambulate without significant chest pain. Otherwise, he might need further ischemic workup on Monday with a stress test.    ____________________________ Chelsea Aus. Kirke Corin, MD maa:ms D: 07/24/2012 14:39:12 ET T: 07/25/2012 20:00:40 ET JOB#: [id number]  cc: Muhammad A. Kirke Corin, MD, <Dictator> Dr. Ward Chatters Ramonita Lab, MD [facility] [place] MD ELECTRONICALLY SIGNED 08/24/2012 17:35

## 2015-01-02 ENCOUNTER — Emergency Department (HOSPITAL_COMMUNITY): Payer: Medicaid Other

## 2015-01-02 ENCOUNTER — Encounter (HOSPITAL_COMMUNITY): Payer: Self-pay | Admitting: *Deleted

## 2015-01-02 ENCOUNTER — Observation Stay (HOSPITAL_COMMUNITY)
Admission: EM | Admit: 2015-01-02 | Discharge: 2015-01-03 | Disposition: A | Discharge status: Home / Self Care | Payer: Medicaid Other | Attending: Internal Medicine | Admitting: Internal Medicine

## 2015-01-02 DIAGNOSIS — Z720 Tobacco use: Secondary | ICD-10-CM | POA: Diagnosis present

## 2015-01-02 DIAGNOSIS — Z86711 Personal history of pulmonary embolism: Secondary | ICD-10-CM | POA: Insufficient documentation

## 2015-01-02 DIAGNOSIS — R0789 Other chest pain: Secondary | ICD-10-CM | POA: Diagnosis present

## 2015-01-02 DIAGNOSIS — I2510 Atherosclerotic heart disease of native coronary artery without angina pectoris: Secondary | ICD-10-CM | POA: Diagnosis not present

## 2015-01-02 DIAGNOSIS — Z7982 Long term (current) use of aspirin: Secondary | ICD-10-CM | POA: Diagnosis not present

## 2015-01-02 DIAGNOSIS — I200 Unstable angina: Secondary | ICD-10-CM

## 2015-01-02 DIAGNOSIS — Z8673 Personal history of transient ischemic attack (TIA), and cerebral infarction without residual deficits: Secondary | ICD-10-CM | POA: Diagnosis not present

## 2015-01-02 DIAGNOSIS — I2699 Other pulmonary embolism without acute cor pulmonale: Secondary | ICD-10-CM

## 2015-01-02 DIAGNOSIS — Z955 Presence of coronary angioplasty implant and graft: Secondary | ICD-10-CM | POA: Diagnosis not present

## 2015-01-02 DIAGNOSIS — I252 Old myocardial infarction: Secondary | ICD-10-CM | POA: Diagnosis not present

## 2015-01-02 DIAGNOSIS — Z833 Family history of diabetes mellitus: Secondary | ICD-10-CM | POA: Diagnosis not present

## 2015-01-02 DIAGNOSIS — F1910 Other psychoactive substance abuse, uncomplicated: Secondary | ICD-10-CM

## 2015-01-02 DIAGNOSIS — M5030 Other cervical disc degeneration, unspecified cervical region: Secondary | ICD-10-CM | POA: Diagnosis not present

## 2015-01-02 DIAGNOSIS — I10 Essential (primary) hypertension: Secondary | ICD-10-CM | POA: Diagnosis not present

## 2015-01-02 DIAGNOSIS — Z7901 Long term (current) use of anticoagulants: Secondary | ICD-10-CM | POA: Diagnosis not present

## 2015-01-02 DIAGNOSIS — F1410 Cocaine abuse, uncomplicated: Secondary | ICD-10-CM | POA: Insufficient documentation

## 2015-01-02 DIAGNOSIS — E785 Hyperlipidemia, unspecified: Secondary | ICD-10-CM | POA: Diagnosis not present

## 2015-01-02 DIAGNOSIS — Z8249 Family history of ischemic heart disease and other diseases of the circulatory system: Secondary | ICD-10-CM | POA: Insufficient documentation

## 2015-01-02 DIAGNOSIS — R1032 Left lower quadrant pain: Secondary | ICD-10-CM

## 2015-01-02 DIAGNOSIS — Z9119 Patient's noncompliance with other medical treatment and regimen: Secondary | ICD-10-CM | POA: Diagnosis not present

## 2015-01-02 DIAGNOSIS — Z9861 Coronary angioplasty status: Secondary | ICD-10-CM

## 2015-01-02 DIAGNOSIS — R079 Chest pain, unspecified: Principal | ICD-10-CM | POA: Insufficient documentation

## 2015-01-02 DIAGNOSIS — F17210 Nicotine dependence, cigarettes, uncomplicated: Secondary | ICD-10-CM | POA: Diagnosis not present

## 2015-01-02 DIAGNOSIS — F329 Major depressive disorder, single episode, unspecified: Secondary | ICD-10-CM | POA: Insufficient documentation

## 2015-01-02 LAB — APTT: APTT: 29 s (ref 24–37)

## 2015-01-02 LAB — BASIC METABOLIC PANEL
Anion gap: 5 (ref 5–15)
BUN: 13 mg/dL (ref 6–20)
CO2: 26 mmol/L (ref 22–32)
Calcium: 8.5 mg/dL — ABNORMAL LOW (ref 8.9–10.3)
Chloride: 109 mmol/L (ref 101–111)
Creatinine, Ser: 1.21 mg/dL (ref 0.61–1.24)
GFR calc Af Amer: >60 mL/min (ref >60)
GLUCOSE: 89 mg/dL (ref 65–99)
POTASSIUM: 3.8 mmol/L (ref 3.5–5.1)
Sodium: 140 mmol/L (ref 135–145)

## 2015-01-02 LAB — BRAIN NATRIURETIC PEPTIDE: B Natriuretic Peptide: 101.3 pg/mL — ABNORMAL HIGH (ref 0.0–100.0)

## 2015-01-02 LAB — HEPARIN LEVEL (UNFRACTIONATED)
HEPARIN UNFRACTIONATED: 0.74 [IU]/mL — AB (ref 0.30–0.70)
Heparin Unfractionated: 0.65 IU/mL (ref 0.30–0.70)

## 2015-01-02 LAB — CBC
HCT: 38.3 % — ABNORMAL LOW (ref 39.0–52.0)
HEMOGLOBIN: 13.3 g/dL (ref 13.0–17.0)
MCH: 31.3 pg (ref 26.0–34.0)
MCHC: 34.7 g/dL (ref 30.0–36.0)
MCV: 90.1 fL (ref 78.0–100.0)
Platelets: 202 10*3/uL (ref 150–400)
RBC: 4.25 MIL/uL (ref 4.22–5.81)
RDW: 13.1 % (ref 11.5–15.5)
WBC: 8.2 10*3/uL (ref 4.0–10.5)

## 2015-01-02 LAB — RAPID URINE DRUG SCREEN, HOSP PERFORMED
Amphetamines: NOT DETECTED
BARBITURATES: NOT DETECTED
Benzodiazepines: NOT DETECTED
Cocaine: POSITIVE — AB
Opiates: POSITIVE — AB
Tetrahydrocannabinol: POSITIVE — AB

## 2015-01-02 LAB — TROPONIN I: Troponin I: <0.03 ng/mL (ref <0.031)

## 2015-01-02 LAB — I-STAT TROPONIN, ED: TROPONIN I, POC: 0 ng/mL (ref 0.00–0.08)

## 2015-01-02 LAB — POC OCCULT BLOOD, ED: FECAL OCCULT BLD: NEGATIVE

## 2015-01-02 LAB — PROTIME-INR
INR: 1.05 (ref 0.00–1.49)
PROTHROMBIN TIME: 13.9 s (ref 11.6–15.2)

## 2015-01-02 MED ORDER — ASPIRIN 81 MG PO CHEW
81.0000 mg | CHEWABLE_TABLET | Freq: Every day | ORAL | Status: DC
  Administered 2015-01-02 – 2015-01-03 (×2): 81 mg via ORAL
  Filled 2015-01-02 (×2): qty 1

## 2015-01-02 MED ORDER — IOHEXOL 300 MG/ML  SOLN
80.0000 mL | Freq: Once | INTRAMUSCULAR | Status: AC | PRN
  Administered 2015-01-02: 100 mL via INTRAVENOUS

## 2015-01-02 MED ORDER — IOHEXOL 300 MG/ML  SOLN: 25.0000 mL | Freq: Once | INTRAMUSCULAR | Status: AC | PRN

## 2015-01-02 MED ORDER — LISINOPRIL 5 MG PO TABS
5.0000 mg | ORAL_TABLET | Freq: Every day | ORAL | Status: DC
  Administered 2015-01-02 – 2015-01-03 (×2): 5 mg via ORAL
  Filled 2015-01-02 (×2): qty 1

## 2015-01-02 MED ORDER — HEPARIN (PORCINE) IN NACL 100-0.45 UNIT/ML-% IJ SOLN
1250.0000 [IU]/h | INTRAMUSCULAR | Status: DC
Start: 2015-01-02 — End: 2015-01-03
  Administered 2015-01-02 – 2015-01-03 (×2): 1250 [IU]/h via INTRAVENOUS
  Filled 2015-01-02 (×2): qty 250

## 2015-01-02 MED ORDER — ATORVASTATIN CALCIUM 80 MG PO TABS
80.0000 mg | ORAL_TABLET | Freq: Every day | ORAL | Status: DC
  Administered 2015-01-02: 80 mg via ORAL
  Filled 2015-01-02 (×3): qty 1

## 2015-01-02 MED ORDER — SODIUM CHLORIDE 0.9 % IV SOLN
Freq: Once | INTRAVENOUS | Status: AC
  Administered 2015-01-02: 03:00:00 via INTRAVENOUS

## 2015-01-02 MED ORDER — GI COCKTAIL ~~LOC~~
30.0000 mL | Freq: Four times a day (QID) | ORAL | Status: DC | PRN
  Filled 2015-01-02: qty 30

## 2015-01-02 MED ORDER — MOMETASONE FURO-FORMOTEROL FUM 100-5 MCG/ACT IN AERO
2.0000 | INHALATION_SPRAY | Freq: Two times a day (BID) | RESPIRATORY_TRACT | Status: DC
  Administered 2015-01-02: 2 via RESPIRATORY_TRACT
  Filled 2015-01-02 (×2): qty 8.8

## 2015-01-02 MED ORDER — MORPHINE SULFATE 4 MG/ML IJ SOLN
4.0000 mg | Freq: Once | INTRAMUSCULAR | Status: AC
  Administered 2015-01-02: 4 mg via INTRAVENOUS
  Filled 2015-01-02: qty 1

## 2015-01-02 MED ORDER — WARFARIN - PHARMACIST DOSING INPATIENT: Freq: Every day | Status: DC

## 2015-01-02 MED ORDER — ONDANSETRON HCL 4 MG/2ML IJ SOLN: 4.0000 mg | Freq: Four times a day (QID) | INTRAMUSCULAR | Status: DC | PRN

## 2015-01-02 MED ORDER — ACETAMINOPHEN 325 MG PO TABS
650.0000 mg | ORAL_TABLET | ORAL | Status: DC | PRN
Start: 2015-01-02 — End: 2015-01-03

## 2015-01-02 MED ORDER — NITROGLYCERIN 0.4 MG SL SUBL: 0.4000 mg | SUBLINGUAL_TABLET | SUBLINGUAL | Status: DC | PRN

## 2015-01-02 MED ORDER — APIXABAN 5 MG PO TABS
5.0000 mg | ORAL_TABLET | Freq: Two times a day (BID) | ORAL | Status: DC
  Filled 2015-01-02 (×2): qty 1

## 2015-01-02 MED ORDER — WARFARIN SODIUM 10 MG PO TABS
10.0000 mg | ORAL_TABLET | Freq: Once | ORAL | Status: AC
  Administered 2015-01-02: 10 mg via ORAL
  Filled 2015-01-02 (×2): qty 1

## 2015-01-02 MED ORDER — HEPARIN BOLUS VIA INFUSION
4000.0000 [IU] | Freq: Once | INTRAVENOUS | Status: AC
  Administered 2015-01-02: 4000 [IU] via INTRAVENOUS
  Filled 2015-01-02: qty 4000

## 2015-01-02 MED ORDER — HEPARIN (PORCINE) IN NACL 100-0.45 UNIT/ML-% IJ SOLN
1350.0000 [IU]/h | INTRAMUSCULAR | Status: DC
  Administered 2015-01-02: 1350 [IU]/h via INTRAVENOUS
  Filled 2015-01-02: qty 250

## 2015-01-02 NOTE — ED Notes (Signed)
Timothy Andreaalled Greg in main pharmacy to clarify heparin order. Patient should be taking coumadin at home, was not taking for at least a month, with hx of PE's and NSTEMI. Heparin drip appropriate.

## 2015-01-02 NOTE — ED Notes (Signed)
Pt. Started having chest pain 3 days ago. Started off as a light pressure and today the pain progressed to a heaviness of someone sitting on his chest. Pt. Has SOB, N/v and back pain. EMS gave 324 of ASA and 4mg  of zofran IV. Pt. Had 2 nitro and could not tolerate a 3rd with no relief. Pt. Also c/o of abdominal pain and blood in stool. Pt. Recently got off blood thinners for frequent blood clots

## 2015-01-02 NOTE — ED Notes (Signed)
Patient transported to CT 

## 2015-01-02 NOTE — ED Provider Notes (Signed)
CSN: [id number]     Arrival date & time 01/02/15  0021 History  This chart was scribed for Derwood Kaplan, MD by Annye Asa, ED Scribe. This patient was seen in room B18C/B18C and the patient's care was started at 2:00 AM.    Chief Complaint  Patient presents with  . Chest Pain   The history is provided by the patient. No language interpreter was used.     HPI Comments: Timothy Peck is a 54 y.o. male with past medical history of NSTEMI (07/18/13), stent in LAD coronary artery (2013), bilateral pulmonary embolism (03/2013), ischemic right ICA stroke (09/2013) brought in by ambulance who presents to the Emergency Department complaining of 1 week of gradually worsening SOB, exacerbated with ambulation, and 3 days of intermittent epigastric chest pain, exacerbated with exertion or emotional response. He reports that this pain began as a light pressure and progressed to a heaviness today; these episodes of pain have increased in intensity and frequency and are now described as "sharp." He also reports lower abdominal pain, nausea and back pain. Patient also reports blood in his stools; brown stool with "fushcia" blood. He notes occasional testicular and scrotal pain. Patient explains he has not been able to take his medication which prompted him to come to the ED tonight. He took 2x  ASA prior to EMS arrival; he was given 2 NTG en route without relief - he could not tolerate a third and refuses further NTG due to "headaches."  He is currently on anticoagulants for blood clots. Cardiologist is Dr. Dorethea Clan.   Past Medical History  Diagnosis Date  . NSTEMI (non-ST elevated myocardial infarction)     07/18/13, stable cath with patent stent; preserved EF by echo; follow-up Echo for CVA 09/2013: EF 60%, mild LVH. No RDW and a. No cardiac source for embolism noted. Bubble study was not performed  . CAD S/P percutaneous coronary angioplasty 02/2012    NSTEMI - PCI to prox LAD  . Presence of drug coated  stent in LAD coronary artery 02/2012    Promus Element DES 4.0 mm x 28 mm; converted from Brilinta to aspirin during stroke evaluation in February 2015  . NSTEMI (non-ST elevated myocardial infarction), 07/18/13, stable cath with patent stent. 01/31/2012  . Bilateral pulmonary embolism August 2014  . 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 Chronic anticoagulation December 2014    Switch from Xarelto to warfarin.  . Depression   . Polysubstance abuse     Past history of cocaine and marijuana, both smoking. Also long standing tobacco abuse.   . Degenerative disc disease, cervical     Dx years ago   Past Surgical History  Procedure Laterality Date  . Tumor removal      left foot third toe  . Anterior cervical decomp/discectomy fusion  11/28/2011    Procedure: ANTERIOR CERVICAL DECOMPRESSION/DISCECTOMY FUSION 1 LEVEL;  Surgeon: Temple Pacini, MD;  Location: [facility];  Service: Neurosurgery;  Laterality: N/A;  Anterior Cervical Six-Seven Decompression and Fusion  . Cardiac catheterization  01/31/2012    Promeus Element DES 4 x 28, balloon-Emerge monorail  . Cardiac catheterization  12/17/12    patent stent, stable CAD  . Left heart catheterization with coronary angiogram N/A 01/31/2012    Procedure: LEFT HEART CATHETERIZATION WITH CORONARY ANGIOGRAM;  Surgeon: Runell Gess, MD;  Location: [facility] CATH LAB;  Service: Cardiovascular;  Laterality: N/A;  . Percutaneous coronary stent intervention (pci-s) N/A 02/02/2012    Procedure:  PERCUTANEOUS CORONARY STENT INTERVENTION (PCI-S);  Surgeon: Marykay Lex, MD;  Location: [facility] CATH LAB;  Service: Cardiovascular;  Laterality: N/A;  . Left heart catheterization with coronary angiogram N/A 12/17/2012    Procedure: LEFT HEART CATHETERIZATION WITH CORONARY ANGIOGRAM;  Surgeon: Marykay Lex, MD;  Location: [facility] CATH LAB;  Service: Cardiovascular;  Laterality: N/A;  . Left heart catheterization with coronary angiogram N/A 07/18/2013    Procedure: LEFT  HEART CATHETERIZATION WITH CORONARY ANGIOGRAM;  Surgeon: Marykay Lex, MD;  Location: [facility] CATH LAB;  Service: Cardiovascular;  Laterality: N/A;   Family History  Problem Relation Age of Onset  . Anesthesia problems Neg Hx   . Hypotension Neg Hx   . Malignant hyperthermia Neg Hx   . Pseudochol deficiency Neg Hx   . Coronary artery disease Father   . Stroke Father 76  . Heart disease Father   . Diabetes Father   . Aneurysm Mother 45    intracranial aneurysm rupture  . Hypertension Mother   . Hypertension Maternal Grandmother   . Diabetes Maternal Grandmother   . Hypertension Paternal Grandmother   . Hypertension Paternal Grandfather   . Cancer Maternal Aunt    History  Substance Use Topics  . Smoking status: Current Every Day Smoker -- 1.00 packs/day for 42 years    Types: Cigarettes    Start date: 01/12/1971  . Smokeless tobacco: Never Used  . Alcohol Use: No     Comment: Report being abstinent since ~January 2015    Review of Systems  Respiratory: Positive for shortness of breath.   Cardiovascular: Positive for chest pain.  Gastrointestinal: Positive for nausea, abdominal pain and blood in stool.  Genitourinary: Positive for testicular pain.  Musculoskeletal: Positive for back pain.  All other systems reviewed and are negative.     Allergies  Ibuprofen  Home Medications   Prior to Admission medications   Medication Sig Start Date End Date Taking? Authorizing Provider  aspirin 81 MG chewable tablet Chew 1 tablet (81 mg total) by mouth daily. Patient not taking: Reported on 01/02/2015 09/21/14   Catarina Hartshorn, MD  atorvastatin (LIPITOR) 80 MG tablet Take 1 tablet (80 mg total) by mouth daily at 6 PM. Patient not taking: Reported on 01/02/2015 09/22/14   Catarina Hartshorn, MD  enoxaparin (LOVENOX) 80 MG/0.8ML injection Inject 0.8 mLs (80 mg total) into the skin every 12 (twelve) hours. Patient not taking: Reported on 01/02/2015 09/29/14   Doris Cheadle, MD  lisinopril  (PRINIVIL,ZESTRIL) 5 MG tablet Take 1 tablet (5 mg total) by mouth daily. Patient not taking: Reported on 01/02/2015 09/22/14   Catarina Hartshorn, MD  mometasone-formoterol [facility]) 100-5 MCG/ACT AERO Inhale 2 puffs into the lungs 2 (two) times daily. Patient not taking: Reported on 09/20/2014 09/27/13   Gust Rung, DO  warfarin (COUMADIN) 5 MG tablet Take 1 tablet (5 mg total) by mouth daily. Patient not taking: Reported on 01/02/2015 09/25/14   Doris Cheadle, MD  warfarin (COUMADIN) 7.5 MG tablet Take 1 tablet (7.5 mg total) by mouth once. Patient not taking: Reported on 01/02/2015 09/22/14   Catarina Hartshorn, MD   BP 125/76 mmHg  Pulse 54  Temp(Src) 98.2 F (36.8 C) (Oral)  Resp 17  Ht 6\' 4"  (1.93 m)  Wt 176 lb (79.833 kg)  BMI 21.43 kg/m2  SpO2 96% Physical Exam  Constitutional: He is oriented to person, place, and time. He appears well-developed and well-nourished. No distress.  HENT:  Head: Normocephalic and atraumatic.  Moist mucous  membranes  Eyes: EOM are normal. Pupils are equal, round, and reactive to light.  Neck: Normal range of motion. Neck supple. No JVD present.  Cardiovascular: Normal rate, regular rhythm and normal heart sounds.  Exam reveals no gallop and no friction rub.   No murmur heard. 2+ and equal radial pulses  Pulmonary/Chest: Effort normal and breath sounds normal. No respiratory distress. He has no wheezes. He has no rales.  Reproducible left-sided chest pain  Abdominal: Soft. Bowel sounds are normal. He exhibits no mass. There is tenderness (Diffuse abdominal tenderness, worst in the LLQ and suprapubic region). There is no rebound and no guarding.  Genitourinary:  No scrotal or penile erythema or rash; testicles are free moving, there is no evidence of hernia  Musculoskeletal: Normal range of motion. He exhibits no edema.  Moves all extremities normally.   Neurological: He is alert and oriented to person, place, and time.  Skin: Skin is warm and dry. No rash noted.   Psychiatric: He has a normal mood and affect. His behavior is normal.  Nursing note and vitals reviewed.   ED Course  Procedures   DIAGNOSTIC STUDIES: Oxygen Saturation is 98% on RA, normal by my interpretation.    COORDINATION OF CARE: 2:11 AM Discussed treatment plan with pt at bedside and pt agreed to plan.   Labs Review Labs Reviewed  CBC - Abnormal; Notable for the following:    HCT 38.3 (*)    All other components within normal limits  BASIC METABOLIC PANEL - Abnormal; Notable for the following:    Calcium 8.5 (*)    All other components within normal limits  APTT  PROTIME-INR  BRAIN NATRIURETIC PEPTIDE  HEPARIN LEVEL (UNFRACTIONATED)  URINE RAPID DRUG SCREEN (HOSP PERFORMED) NOT AT [facility]  I-STAT TROPOININ, ED  POC OCCULT BLOOD, ED    Imaging Review Ct Abdomen Pelvis W Contrast  01/02/2015   CLINICAL DATA:  Left lower quadrant abdominal pain. Nausea. Symptoms for 4 days.  EXAM: CT ABDOMEN AND PELVIS WITH CONTRAST  TECHNIQUE: Multidetector CT imaging of the abdomen and pelvis was performed using the standard protocol following bolus administration of intravenous contrast.  CONTRAST:  100mL OMNIPAQUE IOHEXOL 300 MG/ML  SOLN  COMPARISON:  CT 09/20/2014  FINDINGS: Mild hypoventilatory change at the lung bases. The heart size is normal.  Subcapsular cyst in the left lobe of the liver is unchanged. No new hepatic lesion. The gallbladder is decompressed. There is no biliary dilatation. The spleen and adrenal glands are normal. There is no pancreatic ductal dilatation or inflammatory change.  The right kidney is normal. Cyst in the upper left kidney is unchanged. No hydronephrosis or perinephric stranding.  The stomach is decompressed. There are no dilated or thickened bowel loops. Moderate volume of colonic stool without colonic wall thickening. The appendix is normal. No free air, free fluid, or intra-abdominal fluid collection.  No retroperitoneal adenopathy. Abdominal aorta is  normal in caliber. There is mild atherosclerosis of the abdominal aorta and its branches.  Within the pelvis the urinary bladder is near completely decompressed. The prostate gland is normal in size. There is no pelvic free fluid or adenopathy.  There are no acute or suspicious osseous abnormalities.  IMPRESSION: 1. No acute abnormality in the abdomen/pelvis. 2. Unchanged hepatic and left renal cysts.   Electronically Signed   By: Rubye OaksMelanie  Ehinger M.D.   On: 01/02/2015 06:18   Dg Chest Port 1 View  01/02/2015   CLINICAL DATA:  Acute onset of shortness  of breath and chest pain for 3 days. Initial encounter.  EXAM: PORTABLE CHEST - 1 VIEW  COMPARISON:  Chest radiograph and CTA of the chest performed 09/20/2014  FINDINGS: The lungs are well-aerated and clear. There is no evidence of focal opacification, pleural effusion or pneumothorax.  The cardiomediastinal silhouette is borderline normal in size. No acute osseous abnormalities are seen. Cervical spinal fusion hardware is noted.  IMPRESSION: No acute cardiopulmonary process seen.   Electronically Signed   By: Roanna Raider M.D.   On: 01/02/2015 02:09     EKG Interpretation   Date/Time:  Tuesday Jan 02 2015 00:26:48 EDT Ventricular Rate:  47 PR Interval:  165 QRS Duration: 86 QT Interval:  431 QTC Calculation: 381 R Axis:   69 Text Interpretation:  Slow sinus arrhythmia Borderline ST elevation,  anterior leads - not new No significant change since last tracing  Confirmed by Rhunette Croft, MD, Dreux Mcgroarty [phone]) on 01/02/2015 7:29:24 AM      EKG Interpretation  Date/Time:  Tuesday Jan 02 2015 00:26:48 EDT Ventricular Rate:  47 PR Interval:  165 QRS Duration: 86 QT Interval:  431 QTC Calculation: 381 R Axis:   69 Text Interpretation:  Slow sinus arrhythmia Borderline ST elevation, anterior leads - not new No significant change since last tracing Confirmed by Rhunette Croft, MD, Zailah Zagami [phone]) on 01/02/2015 7:29:24 AM        MDM   Final diagnoses:   Left lower quadrant pain  Unstable angina    I personally performed the services described in this documentation, which was scribed in my presence. The recorded information has been reviewed and is accurate.  Pt comes in with cc of chest pain. Pt has CAD, hx of cocaine use. Chest pain ha concerning features- pressure like, with exertional component that is getting worse and also DIB. He denies any cocaine use right now. Pt is non compliant with his meds. He has PE, not taking coumadin - but the pain he reports is more cardiac type in nature.  Reviewed note by Cards on 3/16 - Dr. Herbie Baltimore had some atypical chest pain as well, with some concerning features - and he had ordered myoview stress.  Will consult cardiology and admit for further evaluation.  Pt also c/o brown stool with possible blood in it. He has L sided abd pain. CT scan ordered to ensure there is no diverticulitis, occult blood neg.  Heparin started.  CRITICAL CARE Performed by: Derwood Kaplan   Total critical care time: 35 minutes - unstable angina  Critical care time was exclusive of separately billable procedures and treating other patients.  Critical care was necessary to treat or prevent imminent or life-threatening deterioration.  Critical care was time spent personally by me on the following activities: development of treatment plan with patient and/or surrogate as well as nursing, discussions with consultants, evaluation of patient's response to treatment, examination of patient, obtaining history from patient or surrogate, ordering and performing treatments and interventions, ordering and review of laboratory studies, ordering and review of radiographic studies, pulse oximetry and re-evaluation of patient's condition.       Derwood Kaplan, MD 01/02/15 [phone]

## 2015-01-02 NOTE — ED Notes (Signed)
Spoke with Admitting MD about pt placement - okay with pt going to telemetry floor.

## 2015-01-02 NOTE — ED Notes (Signed)
Verified with Dr. Shyrl NumbersNanavanti, patient is to receive 4mg  of morphine IV for pain.

## 2015-01-02 NOTE — Progress Notes (Signed)
Pt is a new admit from ED to room 2w04, alert and orient x4, vitals stable, currently denies pain, pt oriented to the unit, bed in low position,call bell, and phone within pt"s reach. Will continue to monitor pt.

## 2015-01-02 NOTE — ED Notes (Signed)
Pt stating that his pain is an 8/10. Pt is also asking for something to eat. Nehemiah SettleBrooke, RN notified

## 2015-01-02 NOTE — Progress Notes (Addendum)
ANTICOAGULATION CONSULT NOTE - Initial Consult  Pharmacy Consult for Heparin Indication: pulmonary embolus  Allergies  Allergen Reactions  . Ibuprofen Other (See Comments)    Doctor told him he could not take    Patient Measurements: Height: 6\' 4"  (193 cm) Weight: 176 lb (79.833 kg) IBW/kg (Calculated) : 86.8  Vital Signs: Temp: 98.2 F (36.8 C) (05/31 0034) Temp Source: Oral (05/31 0034) BP: 125/76 mmHg (05/31 0630) Pulse Rate: 54 (05/31 0615)  Labs:  Recent Labs  01/02/15 0053 01/02/15 0204  HGB 13.3  --   HCT 38.3*  --   PLT 202  --   APTT  --  29  LABPROT  --  13.9  INR  --  1.05  CREATININE 1.21  --     Estimated Creatinine Clearance: 81.5 mL/min (by C-G formula based on Cr of 1.21).   Medical History: Past Medical History  Diagnosis Date  . NSTEMI (non-ST elevated myocardial infarction)     07/18/13, stable cath with patent stent; preserved EF by echo; follow-up Echo for CVA 09/2013: EF 60%, mild LVH. No RDW and a. No cardiac source for embolism noted. Bubble study was not performed  . CAD S/P percutaneous coronary angioplasty 02/2012    NSTEMI - PCI to prox LAD  . Presence of drug coated stent in LAD coronary artery 02/2012    Promus Element DES 4.0 mm x 28 mm; converted from Brilinta to aspirin during stroke evaluation in February 2015  . NSTEMI (non-ST elevated myocardial infarction), 07/18/13, stable cath with patent stent. 01/31/2012  . Bilateral pulmonary embolism August 2014  . 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 Chronic anticoagulation December 2014    Switch from Xarelto to warfarin.  . Depression   . Polysubstance abuse     Past history of cocaine and marijuana, both smoking. Also long standing tobacco abuse.   . Degenerative disc disease, cervical     Dx years ago    Assessment: 54 y.o. male with chest pain, h/o PE and subtherapeutic INR, for heparin  Goal of Therapy:  Heparin level 0.3-0.7 units/ml Monitor platelets by  anticoagulation protocol: Yes   Plan:  Heparin 4000 units IV bolus, then start heparin 1350 units/hr Check heparin level in 6 hours.  Abbott, Gary FleetGregory Vernon 01/02/2015,7:00 AM    ================================   Addendum: - transition to Eliquis for hx PE 2014   Goal of Therapy: Full anticoagulation   Plan: - Eliquis 5mg  PO BID, stop heparin infusion when Eliquis is administered - Pharmacy will sign off and follow peripherally    Adeana Grilliot D. Laney Potashang, PharmD, BCPS Pager:  [phone] 01/02/2015, 9:25 AM     ========================   Addendum: - cancel Eliquis order and continue heparin/warfarin - med noncompliance   Plan: - Continue heparin gtt at 1350 units/hr - Coumadin 10mg  PO today - HL at 1400 - Daily HL / CBC / PT / INR    Leyli Kevorkian D. Laney Potashang, PharmD, BCPS Pager:  [phone] 01/02/2015, 9:45 AM

## 2015-01-02 NOTE — H&P (Signed)
Patient Demographics  Timothy Peck, is a 54 y.o. male  MRN: [id number]   DOB - 1961-05-22  Admit Date - 01/02/2015  Outpatient Primary MD for the patient is Doris Cheadle, MD   With History of -  Past Medical History  Diagnosis Date  . NSTEMI (non-ST elevated myocardial infarction)     07/18/13, stable cath with patent stent; preserved EF by echo; follow-up Echo for CVA 09/2013: EF 60%, mild LVH. No RDW and a. No cardiac source for embolism noted. Bubble study was not performed  . CAD S/P percutaneous coronary angioplasty 02/2012    NSTEMI - PCI to prox LAD  . Presence of drug coated stent in LAD coronary artery 02/2012    Promus Element DES 4.0 mm x 28 mm; converted from Brilinta to aspirin during stroke evaluation in February 2015  . NSTEMI (non-ST elevated myocardial infarction), 07/18/13, stable cath with patent stent. 01/31/2012  . Bilateral pulmonary embolism August 2014  . 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 Chronic anticoagulation December 2014    Switch from Xarelto to warfarin.  . Depression   . Polysubstance abuse     Past history of cocaine and marijuana, both smoking. Also long standing tobacco abuse.   . Degenerative disc disease, cervical     Dx years ago      Past Surgical History  Procedure Laterality Date  . Tumor removal      left foot third toe  . Anterior cervical decomp/discectomy fusion  11/28/2011    Procedure: ANTERIOR CERVICAL DECOMPRESSION/DISCECTOMY FUSION 1 LEVEL;  Surgeon: Temple Pacini, MD;  Location: [facility];  Service: Neurosurgery;  Laterality: N/A;  Anterior Cervical Six-Seven Decompression and Fusion  . Cardiac catheterization  01/31/2012    Promeus Element DES 4 x 28, balloon-Emerge monorail  . Cardiac catheterization  12/17/12    patent stent, stable CAD  . Left heart catheterization with coronary angiogram N/A 01/31/2012    Procedure: LEFT HEART CATHETERIZATION WITH CORONARY ANGIOGRAM;  Surgeon: Runell Gess, MD;   Location[facility] CATH LAB;  Service: Cardiovascular;  Laterality: N/A;  . Percutaneous coronary stent intervention (pci-s) N/A 02/02/2012    Procedure: PERCUTANEOUS CORONARY STENT INTERVENTION (PCI-S);  Surgeon: Marykay Lex, MD;  Location: [facility] CATH LAB;  Service: Cardiovascular;  Laterality: N/A;  . Left heart catheterization with coronary angiogram N/A 12/17/2012    Procedure: LEFT HEART CATHETERIZATION WITH CORONARY ANGIOGRAM;  Surgeon: Marykay Lex, MD;  Location: [facility] CATH LAB;  Service: Cardiovascular;  Laterality: N/A;  . Left heart catheterization with coronary angiogram N/A 07/18/2013    Procedure: LEFT HEART CATHETERIZATION WITH CORONARY ANGIOGRAM;  Surgeon: Marykay Lex, MD;  Location: [facility] CATH LAB;  Service: Cardiovascular;  Laterality: N/A;    in for   Chief Complaint  Patient presents with  . Chest Pain     HPI  Keyvin Rison  is a 54 y.o. male, with h/o NSTEMI in July 2013, PCI to prox LAD. Several months later had PE and started on Xarelto. Xarelto was switched to brilinta, which was then changed to coumadin for stroke prophylaxis in the setting of known R ICA stenosis back in 09/21/13, patient with known history on noncompliance and substance abuse, patient presents with complaints of chest pain, worsening shortness of breath, reports chest pain is midsternal, sharp quality, and by coughing and taking deep breath, reports lower abdominal pain, nausea, back pain, patient urine drug screen was positive for cocaine and THC, patient  reports no relief with sublingual nitroglycerin, he took 162 mg of aspirin at home, EKG nonacute, troponin negative 1, hospitalist requested to admit for further evaluation.    Review of Systems    In addition to the HPI above,  No Fever-chills, No Headache, No changes with Vision or hearing, No problems swallowing food or Liquids, Complaints of Chest pain, Cough and Shortness of Breath, No Abdominal pain, No Nausea or Vommitting, Bowel movements  are regular, No Blood in stool or Urine, No dysuria, No new skin rashes or bruises, No new joints pains-aches,  No new weakness, tingling, numbness in any extremity, No recent weight gain or loss, No polyuria, polydypsia or polyphagia, No significant Mental Stressors.  A full 10 point Review of Systems was done, except as stated above, all other Review of Systems were negative.   Social History History  Substance Use Topics  . Smoking status: Current Every Day Smoker -- 1.00 packs/day for 42 years    Types: Cigarettes    Start date: 01/12/1971  . Smokeless tobacco: Never Used  . Alcohol Use: No     Comment: Report being abstinent since ~January 2015     Family History Family History  Problem Relation Age of Onset  . Anesthesia problems Neg Hx   . Hypotension Neg Hx   . Malignant hyperthermia Neg Hx   . Pseudochol deficiency Neg Hx   . Coronary artery disease Father   . Stroke Father 1860  . Heart disease Father   . Diabetes Father   . Aneurysm Mother 45    intracranial aneurysm rupture  . Hypertension Mother   . Hypertension Maternal Grandmother   . Diabetes Maternal Grandmother   . Hypertension Paternal Grandmother   . Hypertension Paternal Grandfather   . Cancer Maternal Aunt      Prior to Admission medications   Medication Sig Start Date End Date Taking? Authorizing Provider  aspirin 81 MG chewable tablet Chew 1 tablet (81 mg total) by mouth daily. Patient not taking: Reported on 01/02/2015 09/21/14   Catarina Hartshornavid Tat, MD  atorvastatin (LIPITOR) 80 MG tablet Take 1 tablet (80 mg total) by mouth daily at 6 PM. Patient not taking: Reported on 01/02/2015 09/22/14   Catarina Hartshornavid Tat, MD  enoxaparin (LOVENOX) 80 MG/0.8ML injection Inject 0.8 mLs (80 mg total) into the skin every 12 (twelve) hours. Patient not taking: Reported on 01/02/2015 09/29/14   Doris Cheadleeepak Advani, MD  lisinopril (PRINIVIL,ZESTRIL) 5 MG tablet Take 1 tablet (5 mg total) by mouth daily. Patient not taking: Reported on  01/02/2015 09/22/14   Catarina Hartshornavid Tat, MD  mometasone-formoterol [facility](DULERA) 100-5 MCG/ACT AERO Inhale 2 puffs into the lungs 2 (two) times daily. Patient not taking: Reported on 09/20/2014 09/27/13   Gust RungErik C Hoffman, DO  warfarin (COUMADIN) 5 MG tablet Take 1 tablet (5 mg total) by mouth daily. Patient not taking: Reported on 01/02/2015 09/25/14   Doris Cheadleeepak Advani, MD  warfarin (COUMADIN) 7.5 MG tablet Take 1 tablet (7.5 mg total) by mouth once. Patient not taking: Reported on 01/02/2015 09/22/14   Catarina Hartshornavid Tat, MD    Allergies  Allergen Reactions  . Ibuprofen Other (See Comments)    Doctor told him he could not take    Physical Exam  Vitals  Blood pressure 149/87, pulse 52, temperature 98.2 F (36.8 C), temperature source Oral, resp. rate 14, height 6\' 4"  (1.93 m), weight 79.833 kg (176 lb), SpO2 96 %.   1. General thin comfortable male lying in bed in NAD,  2. Normal affect and insight, Not Suicidal or Homicidal, Awake Alert, Oriented X 3.  3. No F.N deficits, ALL C.Nerves Intact, Strength 5/5 all 4 extremities, Sensation intact all 4 extremities, Plantars down going.  4. Ears and Eyes appear Normal, Conjunctivae clear, PERRLA. Moist Oral Mucosa.  5. Supple Neck, No JVD, No cervical lymphadenopathy appriciated, No Carotid Bruits.  6. Symmetrical Chest wall movement, Good air movement bilaterally, CTAB. Reproducible midsternal chest pain on palpation.  7. RRR, No Gallops, Rubs or Murmurs, No Parasternal Heave.  8. Positive Bowel Sounds, Abdomen Soft, No tenderness, No organomegaly appriciated,No rebound -guarding or rigidity.  9.  No Cyanosis, Normal Skin Turgor, No Skin Rash or Bruise.  10. Good muscle tone,  joints appear normal , no effusions, Normal ROM.  11. No Palpable Lymph Nodes in Neck or Axillae    Data Review  CBC  Recent Labs Lab 01/02/15 0053  WBC 8.2  HGB 13.3  HCT 38.3*  PLT 202  MCV 90.1  MCH 31.3  MCHC 34.7  RDW 13.1    ------------------------------------------------------------------------------------------------------------------  Chemistries   Recent Labs Lab 01/02/15 0053  NA 140  K 3.8  CL 109  CO2 26  GLUCOSE 89  BUN 13  CREATININE 1.21  CALCIUM 8.5*   ------------------------------------------------------------------------------------------------------------------ estimated creatinine clearance is 81.5 mL/min (by C-G formula based on Cr of 1.21). ------------------------------------------------------------------------------------------------------------------ No results for input(s): TSH, T4TOTAL, T3FREE, THYROIDAB in the last 72 hours.  Invalid input(s): FREET3   Coagulation profile  Recent Labs Lab 01/02/15 0204  INR 1.05   ------------------------------------------------------------------------------------------------------------------- No results for input(s): DDIMER in the last 72 hours. -------------------------------------------------------------------------------------------------------------------  Cardiac Enzymes No results for input(s): CKMB, TROPONINI, MYOGLOBIN in the last 168 hours.  Invalid input(s): CK ------------------------------------------------------------------------------------------------------------------ Invalid input(s): POCBNP   ---------------------------------------------------------------------------------------------------------------  Urinalysis    Component Value Date/Time   COLORURINE YELLOW 07/18/2013 2348   APPEARANCEUR CLEAR 07/18/2013 2348   LABSPEC 1.027 07/18/2013 2348   PHURINE 7.0 07/18/2013 2348   GLUCOSEU NEGATIVE 07/18/2013 2348   HGBUR TRACE* 07/18/2013 2348   BILIRUBINUR NEGATIVE 07/18/2013 2348   KETONESUR NEGATIVE 07/18/2013 2348   PROTEINUR NEGATIVE 07/18/2013 2348   UROBILINOGEN 1.0 07/18/2013 2348   NITRITE NEGATIVE 07/18/2013 2348   LEUKOCYTESUR TRACE* 07/18/2013 2348     ----------------------------------------------------------------------------------------------------------------  Imaging results:   Ct Abdomen Pelvis W Contrast  01/02/2015   CLINICAL DATA:  Left lower quadrant abdominal pain. Nausea. Symptoms for 4 days.  EXAM: CT ABDOMEN AND PELVIS WITH CONTRAST  TECHNIQUE: Multidetector CT imaging of the abdomen and pelvis was performed using the standard protocol following bolus administration of intravenous contrast.  CONTRAST:  OMNIPAQUE IOHEXOL 300 MG/ML  SOLN  COMPARISON:  CT 09/20/2014  FINDINGS: Mild hypoventilatory change at the lung bases. The heart size is normal.  Subcapsular cyst in the left lobe of the liver is unchanged. No new hepatic lesion. The gallbladder is decompressed. There is no biliary dilatation. The spleen and adrenal glands are normal. There is no pancreatic ductal dilatation or inflammatory change.  The right kidney is normal. Cyst in the upper left kidney is unchanged. No hydronephrosis or perinephric stranding.  The stomach is decompressed. There are no dilated or thickened bowel loops. Moderate volume of colonic stool without colonic wall thickening. The appendix is normal. No free air, free fluid, or intra-abdominal fluid collection.  No retroperitoneal adenopathy. Abdominal aorta is normal in caliber. There is mild atherosclerosis of the abdominal aorta and its branches.  Within the pelvis the urinary bladder is near  completely decompressed. The prostate gland is normal in size. There is no pelvic free fluid or adenopathy.  There are no acute or suspicious osseous abnormalities.  IMPRESSION: 1. No acute abnormality in the abdomen/pelvis. 2. Unchanged hepatic and left renal cysts.   Electronically Signed   By: Rubye Oaks M.D.   On: 01/02/2015 06:18   Dg Chest Port 1 View  01/02/2015   CLINICAL DATA:  Acute onset of shortness of breath and chest pain for 3 days. Initial encounter.  EXAM: PORTABLE CHEST - 1 VIEW   COMPARISON:  Chest radiograph and CTA of the chest performed 09/20/2014  FINDINGS: The lungs are well-aerated and clear. There is no evidence of focal opacification, pleural effusion or pneumothorax.  The cardiomediastinal silhouette is borderline normal in size. No acute osseous abnormalities are seen. Cervical spinal fusion hardware is noted.  IMPRESSION: No acute cardiopulmonary process seen.   Electronically Signed   By: Roanna Raider M.D.   On: 01/02/2015 02:09    My personal review of EKG: Rhythm NSR, Rate  50 /min, QTc 386 , no Acute ST changes    Assessment & Plan  Principal Problem:   Chest pain, musculoskeletal Active Problems:   Substance abuse   Tobacco abuse   CAD S/P percutaneous coronary angioplasty 02/21/12 non-STEMI -Promus DES 4.0 mm 20 mm to prox LAD   Pulmonary embolism and infarction, 03/13/13, on warfarin    Chest pain - Appears nontypical, musculoskeletal, reproduced by palpation, worsened by cough, but given his cardiac history he'll be admitted for further workup. - EDV consulted cardiology, and they will evaluate the patient during hospital stay. - Continue with aspirin, sublingual nitroglycerin as needed, cycle cardiac enzymes 3, monitor on telemetry. - As well cocaine use might be contributing to chest pain.  Substance abuse - Patient denies any cocaine use even he was positive for cocaine, was counseled.  Tobacco use - Counseled  History of PE - Currently on heparin drip, INR is subtherapeutic, to be monitored by pharmacy.  Dyslipidemia - Continue with statin  Noncompliance - Consult  Hypertension - Continue with lisinopril, no beta blockers given cocaine use, and history of pericardium the past.  DVT Prophylaxis on heparin GTT  AM Labs Ordered, also please review Full Orders  Family Communication: Admission, patients condition and plan of care including tests being ordered have been discussed with the patient who indicate understanding and  agree with the plan and Code Status.  Code Status Full  Likely DC to  Home   Condition GUARDED   Time spent in minutes : 55 minutes    ELGERGAWY, DAWOOD M.D on 01/02/2015 at 9:25 AM  Between 7am to 7pm - Pager - [phone]  After 7pm go to [email or web address] - password [email or web address]  And look for the night coverage person covering me after hours  [facility] Office  [phone]

## 2015-01-02 NOTE — ED Notes (Signed)
Patient refused nitro for chest pain management. MD aware.

## 2015-01-02 NOTE — ED Notes (Signed)
Spoke with pharmacy - okay for pt to take aspirin 81mg  with heparin drip.

## 2015-01-02 NOTE — Consult Note (Signed)
CARDIOLOGY CONSULTATION NOTE.  NAME:  Timothy Peck   MRN: [id number] DOB:  05-24-1961   ADMIT DATE: 01/02/2015  Reason for Consult: Chest pain with prior CAD history  Requesting Physician: [facility] / ED Physician  Primary Cardiologist: Marykay Lex, MD  HPI: This is a 54 y.o. male with a past medical history significant for single vessel CAD with proximal LAD lesion treated with a DES stent in 2013 found be patent in December 2014. He also has a history of bilateral PE in August 2014 and the right ICA stroke in February 2015. He had been on various different forms of anticoagulation and this posterior currently be on aspirin plus warfarin. Unfortunately his INR subtherapeutic due to admitted nonadherence. He also has a long-standing history of polysubstance abuse, again admitting to having smoked marijuana and crack cocaine in the last 6 days confirmed with positive urine toxicology study.  The patient states that he last used cocaine 6 days ago. For the last 4 days he's been noticing diffuse chest discomfort across his chest and his left lower rib cage. He's been coughing persistently. He describes the pain as sharp in nature and at worst 8 /10, but usually more like 4-5/10.  He states that he has been more short of breath with symptoms that are somewhat consistent with PND and orthopnea but no edema. Prior to these past few days he is maximal working out of the gym and on the treadmill as well as lifting weights with no anginal type symptoms. He has also been noticing lower abdominal pain in the right lower quadrant and infraumbilical region.  He says that his chest discomfort has been essentially persistent the last 4 days and associate with nausea but no actual vomiting. The chest discomfort is not necessarily exacerbated by activity besides deep inspiration and coughing. No hemoptysis.  He has had some mild loose stools with some mild flexs of blood but no extensive he  hematochezia or melanoma.  Past Medical History  Diagnosis Date  . NSTEMI (non-ST elevated myocardial infarction)     07/18/13, stable cath with patent stent; preserved EF by echo; follow-up Echo for CVA 09/2013: EF 60%, mild LVH. No RDW and a. No cardiac source for embolism noted. Bubble study was not performed  . CAD S/P percutaneous coronary angioplasty 02/2012    NSTEMI - PCI to prox LAD  . Presence of drug coated stent in LAD coronary artery 02/2012    Promus Element DES 4.0 mm x 28 mm; converted from Brilinta to aspirin during stroke evaluation in February 2015  . NSTEMI (non-ST elevated myocardial infarction), 07/18/13, stable cath with patent stent. 01/31/2012  . Bilateral pulmonary embolism August 2014  . 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 Chronic anticoagulation December 2014    Switch from Xarelto to warfarin.  . Depression   . Polysubstance abuse     Past history of cocaine and marijuana, both smoking. Also long standing tobacco abuse.   . Degenerative disc disease, cervical     Dx years ago   Past Surgical History  Procedure Laterality Date  . Tumor removal      left foot third toe  . Anterior cervical decomp/discectomy fusion  11/28/2011    Procedure: ANTERIOR CERVICAL DECOMPRESSION/DISCECTOMY FUSION 1 LEVEL;  Surgeon: Temple Pacini, MD;  Location: [facility];  Service: Neurosurgery;  Laterality: N/A;  Anterior Cervical Six-Seven Decompression and Fusion  . Cardiac catheterization  01/31/2012    Promeus Element  DES 4 x 28, balloon-Emerge monorail  . Cardiac catheterization  12/17/12    patent stent, stable CAD  . Left heart catheterization with coronary angiogram N/A 01/31/2012    Procedure: LEFT HEART CATHETERIZATION WITH CORONARY ANGIOGRAM;  Surgeon: Runell Gess, MD;  Location: [facility] CATH LAB;  Service: Cardiovascular;  Laterality: N/A;  . Percutaneous coronary stent intervention (pci-s) N/A 02/02/2012    Procedure: PERCUTANEOUS CORONARY STENT INTERVENTION  (PCI-S);  Surgeon: Marykay Lex, MD;  Location: [facility] CATH LAB;  Service: Cardiovascular;  Laterality: N/A;  . Left heart catheterization with coronary angiogram N/A 12/17/2012    Procedure: LEFT HEART CATHETERIZATION WITH CORONARY ANGIOGRAM;  Surgeon: Marykay Lex, MD;  Location: [facility] CATH LAB;  Service: Cardiovascular;  Laterality: N/A;  . Left heart catheterization with coronary angiogram N/A 07/18/2013    Procedure: LEFT HEART CATHETERIZATION WITH CORONARY ANGIOGRAM;  Surgeon: Marykay Lex, MD;  Location: [facility] CATH LAB;  Service: Cardiovascular;  Laterality: N/A;    FAMHx: Family History  Problem Relation Age of Onset  . Anesthesia problems Neg Hx   . Hypotension Neg Hx   . Malignant hyperthermia Neg Hx   . Pseudochol deficiency Neg Hx   . Coronary artery disease Father   . Stroke Father 47  . Heart disease Father   . Diabetes Father   . Aneurysm Mother 45    intracranial aneurysm rupture  . Hypertension Mother   . Hypertension Maternal Grandmother   . Diabetes Maternal Grandmother   . Hypertension Paternal Grandmother   . Hypertension Paternal Grandfather   . Cancer Maternal Aunt     SOCHx:  reports that he has been smoking Cigarettes.  He started smoking about 44 years ago. He has a 42 pack-year smoking history. He has never used smokeless tobacco. He reports that he uses illicit drugs (Cocaine, Marijuana, and Other-see comments). He reports that he does not drink alcohol.  ALLERGIES: Allergies  Allergen Reactions  . Ibuprofen Other (See Comments)    Doctor told him he could not take   ROS: A comprehensive review of systems was performed:  Review of Systems  Constitutional: Negative for malaise/fatigue.  Respiratory: Positive for cough and shortness of breath. Negative for sputum production and wheezing.   Cardiovascular: Positive for chest pain.  Gastrointestinal: Positive for nausea, abdominal pain (peri-umbilical & LLQ), diarrhea (Loose stool but no real diarrhea)  and blood in stool (Small flecks of blood).  Genitourinary: Negative for hematuria.  Musculoskeletal: Negative.   Neurological: Positive for headaches.  Endo/Heme/Allergies: Does not bruise/bleed easily.  Psychiatric/Behavioral: Positive for substance abuse (Admits to smoking crack cocaine 6 days ago).       Admits to smoking crack cocaine 6 days ago  All other systems reviewed and are negative.   HOME MEDICATIONS: No current facility-administered medications on file prior to encounter.   Current Outpatient Prescriptions on File Prior to Encounter  Medication Sig Dispense Refill  . aspirin 81 MG chewable tablet Chew 1 tablet (81 mg total) by mouth daily. (Patient not taking: Reported on 01/02/2015) 30 tablet 0  . atorvastatin (LIPITOR) 80 MG tablet Take 1 tablet (80 mg total) by mouth daily at 6 PM. (Patient not taking: Reported on 01/02/2015) 30 tablet 1  . enoxaparin (LOVENOX) 80 MG/0.8ML injection Inject 0.8 mLs (80 mg total) into the skin every 12 (twelve) hours. (Patient not taking: Reported on 01/02/2015) 14 Syringe 0  . lisinopril (PRINIVIL,ZESTRIL) 5 MG tablet Take 1 tablet (5 mg total) by mouth daily. (Patient not  taking: Reported on 01/02/2015) 30 tablet 1  . mometasone-formoterol (DULERA) 100-5 MCG/ACT AERO Inhale 2 puffs into the lungs 2 (two) times daily. (Patient not taking: Reported on 09/20/2014) 1 Inhaler 0  . warfarin (COUMADIN) 5 MG tablet Take 1 tablet (5 mg total) by mouth daily. (Patient not taking: Reported on 01/02/2015) 30 tablet 0  . warfarin (COUMADIN) 7.5 MG tablet Take 1 tablet (7.5 mg total) by mouth once. (Patient not taking: Reported on 01/02/2015) 5 tablet 0    HOSPITAL MEDICATIONS: . aspirin  81 mg Oral Daily  . atorvastatin  80 mg Oral q1800  . lisinopril  5 mg Oral Daily  . mometasone-formoterol  2 puff Inhalation BID  . warfarin  10 mg Oral ONCE-1800  . Warfarin - Pharmacist Dosing Inpatient   Does not apply q1800    VITALS: Blood pressure 132/93, pulse  71, temperature 98.2 F (36.8 C), temperature source Oral, resp. rate 19, height 6\' 4"  (1.93 m), weight 79.833 kg (176 lb), SpO2 98 %.  PHYSICAL EXAM: General appearance: alert, cooperative, appears stated age, no distress and Otherwise healthy-appearing Neck: no adenopathy, no carotid bruit, no JVD and supple, symmetrical, trachea midline Lungs: clear to auscultation bilaterally, normal percussion bilaterally and Nonlabored, good air movement Heart: Borderline bradycardic with normal rhythm. Normal S1/S2. Nondisplaced PMI. No M/R/G Abdomen: Soft with diffuse tenderness in the lower abdominal area from left to mid. No rebound.. No HSM Extremities: extremities normal, atraumatic, no cyanosis or edema Pulses: 2+ and symmetric Skin: Skin color, texture, turgor normal. No rashes or lesions Neurologic: Mental status: Alert, oriented, thought content appropriate Cranial nerves: normal Does have reproducible tenderness along the left costal margin up into the sternal margin.  LABS: Results for orders placed or performed during the hospital encounter of 01/02/15 (from the past 24 hour(s))  CBC     Status: Abnormal   Collection Time: 01/02/15 12:53 AM  Result Value Ref Range   WBC 8.2 4.0 - 10.5 K/uL   RBC 4.25 4.22 - 5.81 MIL/uL   Hemoglobin 13.3 13.0 - 17.0 g/dL   HCT 40.9 (L) 81.1 - 91.4 %   MCV 90.1 78.0 - 100.0 fL   MCH 31.3 26.0 - 34.0 pg   MCHC 34.7 30.0 - 36.0 g/dL   RDW 78.2 95.6 - 21.3 %   Platelets 202 150 - 400 K/uL  Basic metabolic panel     Status: Abnormal   Collection Time: 01/02/15 12:53 AM  Result Value Ref Range   Sodium 140 135 - 145 mmol/L   Potassium 3.8 3.5 - 5.1 mmol/L   Chloride 109 101 - 111 mmol/L   CO2 26 22 - 32 mmol/L   Glucose, Bld 89 65 - 99 mg/dL   BUN 13 6 - 20 mg/dL   Creatinine, Ser 0.86 0.61 - 1.24 mg/dL   Calcium 8.5 (L) 8.9 - 10.3 mg/dL   GFR calc non Af Amer >60 >60 mL/min   GFR calc Af Amer >60 >60 mL/min   Anion gap 5 5 - 15  I-stat  troponin, ED  (not at [facility])     Status: None   Collection Time: 01/02/15  1:03 AM  Result Value Ref Range   Troponin i, poc 0.00 0.00 - 0.08 ng/mL   Comment 3          APTT     Status: None   Collection Time: 01/02/15  2:04 AM  Result Value Ref Range   aPTT 29 24 - 37 seconds  Protime-INR     Status: None   Collection Time: 01/02/15  2:04 AM  Result Value Ref Range   Prothrombin Time 13.9 11.6 - 15.2 seconds   INR 1.05 0.00 - 1.49  POC occult blood, ED RN will collect     Status: None   Collection Time: 01/02/15  3:30 AM  Result Value Ref Range   Fecal Occult Bld NEGATIVE NEGATIVE  Urine rapid drug screen (hosp performed)not at [facility]     Status: Abnormal   Collection Time: 01/02/15  7:51 AM  Result Value Ref Range   Opiates POSITIVE (A) NONE DETECTED   Cocaine POSITIVE (A) NONE DETECTED   Benzodiazepines NONE DETECTED NONE DETECTED   Amphetamines NONE DETECTED NONE DETECTED   Tetrahydrocannabinol POSITIVE (A) NONE DETECTED   Barbiturates NONE DETECTED NONE DETECTED    IMAGING: Ct Abdomen Pelvis W Contrast  01/02/2015   CLINICAL DATA:  Left lower quadrant abdominal pain. Nausea. Symptoms for 4 days.  EXAM: CT ABDOMEN AND PELVIS WITH CONTRAST  TECHNIQUE: Multidetector CT imaging of the abdomen and pelvis was performed using the standard protocol following bolus administration of intravenous contrast.  CONTRAST:  OMNIPAQUE IOHEXOL 300 MG/ML  SOLN  COMPARISON:  CT 09/20/2014  FINDINGS: Mild hypoventilatory change at the lung bases. The heart size is normal.  Subcapsular cyst in the left lobe of the liver is unchanged. No new hepatic lesion. The gallbladder is decompressed. There is no biliary dilatation. The spleen and adrenal glands are normal. There is no pancreatic ductal dilatation or inflammatory change.  The right kidney is normal. Cyst in the upper left kidney is unchanged. No hydronephrosis or perinephric stranding.  The stomach is decompressed. There are no dilated or  thickened bowel loops. Moderate volume of colonic stool without colonic wall thickening. The appendix is normal. No free air, free fluid, or intra-abdominal fluid collection.  No retroperitoneal adenopathy. Abdominal aorta is normal in caliber. There is mild atherosclerosis of the abdominal aorta and its branches.  Within the pelvis the urinary bladder is near completely decompressed. The prostate gland is normal in size. There is no pelvic free fluid or adenopathy.  There are no acute or suspicious osseous abnormalities.  IMPRESSION: 1. No acute abnormality in the abdomen/pelvis. 2. Unchanged hepatic and left renal cysts.   Electronically Signed   By: Rubye Oaks M.D.   On: 01/02/2015 06:18   Dg Chest Port 1 View  01/02/2015   CLINICAL DATA:  Acute onset of shortness of breath and chest pain for 3 days. Initial encounter.  EXAM: PORTABLE CHEST - 1 VIEW  COMPARISON:  Chest radiograph and CTA of the chest performed 09/20/2014  FINDINGS: The lungs are well-aerated and clear. There is no evidence of focal opacification, pleural effusion or pneumothorax.  The cardiomediastinal silhouette is borderline normal in size. No acute osseous abnormalities are seen. Cervical spinal fusion hardware is noted.  IMPRESSION: No acute cardiopulmonary process seen.   Electronically Signed   By: Roanna Raider M.D.   On: 01/02/2015 02:09    IMPRESSION: Principal Problem:   Chest pain, musculoskeletal Active Problems:   CAD S/P percutaneous coronary angioplasty 02/21/12 non-STEMI -Promus DES 4.0 mm 20 mm to prox LAD   Pulmonary embolism and infarction, 03/13/13, on warfarin   Chronic anticoagulation, since 03/2013 for PE   Substance abuse   Tobacco abuse  I concur with the hospitalist assessment this chest pain is most likely musculoskeletal. It all began shortly after smoking crack cocaine. I suspect that  he may have had some potential acute lung injury from the crack cocaine that is leading to coughing and now has  coughing related musculoskeletal chest pain. I would not pursue an ischemic evaluation at this time. His EKG is unremarkable and cardiac enzymes have been negative.  RECOMMENDATION: 1. Rule out myocardial infarction with third set of enzymes. If negative would not pursue further ischemic evaluation, simply ensure that he has cardiology follow-up to make sure that he is still taking his home medications. 2. For possible coronary spasm related to cocaine, could consider using amlodipine. Hypertensive treatment. 3. Otherwise would continue home medications. 4. He has a sub-therapeutic INR, but his last PE was over a year and a half ago. I don't think he needs bridging beyond IV And overnight. Would simply increase warfarin dosing ensure that he has follow-up established for INR monitoring. He does not like to go to the clinic "across the street "for his INR checks, because they constantly get him "shots in the stomach " which is likely Lovenox for bridging. I don't think that he needs bridging anymore, specifically treat him as a new start warfarin.  I would anticipate he could probably be discharged as early as tomorrow if even this afternoon. We will help arrange follow-up with a PA/NP to ensure that he is back on his medications. Will also try to arrange [facility]  I discussed my findings and recommendations with the primary Hospitalist, Dr. Randol KernElgergawy  Time Spent Directly with Patient: 35 minutes  HARDING, Piedad ClimesAVID W, M.D., M.S. Interventional Cardiologist   Pager # [phone]

## 2015-01-02 NOTE — Progress Notes (Signed)
ANTICOAGULATION CONSULT NOTE - Follow Up Consult  Pharmacy Consult for Heparin Indication: chest pain, hx PE  Allergies  Allergen Reactions  . Ibuprofen Other (See Comments)    Doctor told him he could not take    Patient Measurements: Height: 6\' 4"  (193 cm) Weight: 175 lb 14.8 oz (79.8 kg) IBW/kg (Calculated) : 86.8 Heparin Dosing Weight: 80 kg  Vital Signs: Temp: 97.9 F (36.6 C) (05/31 1147) Temp Source: Oral (05/31 1147) BP: 154/99 mmHg (05/31 1147) Pulse Rate: 62 (05/31 1147)  Labs:  Recent Labs  01/02/15 0053 01/02/15 0204 01/02/15 1053 01/02/15 1410  HGB 13.3  --   --   --   HCT 38.3*  --   --   --   PLT 202  --   --   --   APTT  --  29  --   --   LABPROT  --  13.9  --   --   INR  --  1.05  --   --   HEPARINUNFRC  --   --   --  0.74*  CREATININE 1.21  --   --   --   TROPONINI  --   --  <0.03 <0.03    Estimated Creatinine Clearance: 81.5 mL/min (by C-G formula based on Cr of 1.21).   Medications:  Scheduled:  . aspirin  81 mg Oral Daily  . atorvastatin  80 mg Oral q1800  . lisinopril  5 mg Oral Daily  . mometasone-formoterol  2 puff Inhalation BID  . warfarin  10 mg Oral ONCE-1800  . Warfarin - Pharmacist Dosing Inpatient   Does not apply q1800    Assessment: 54 yo M who was supposed to be on Coumadin PTA for hx PE (2014) but admits to noncompliance.  Pt presented to ED 5/30 with new CP and was started on heparin infusion.  Noted plan to continue heparin overnight, cycle troponin, r/o MI.  No plans for Coumadin bridging since PE >1 year ago.    Heparin level slightly above goal on 1350 units/hr.  Goal of Therapy:  INR 2-3 Heparin level 0.3-0.7 units/ml Monitor platelets by anticoagulation protocol: Yes   Plan:  Reduce heparin infusion to 1250 units/hr. Next heparin level in 6 hours. Heparin level and CBC daily while on heparin.  Toys 'R' UsKimberly Odes Lolli, Pharm.D., BCPS Clinical Pharmacist Pager [phone] 01/02/2015 3:35 PM

## 2015-01-02 NOTE — Progress Notes (Signed)
ANTICOAGULATION CONSULT NOTE Pharmacy Consult for Heparin Indication: chest pain, hx PE  Allergies  Allergen Reactions  . Ibuprofen Other (See Comments)    Doctor told him he could not take    Patient Measurements: Height: 6\' 4"  (193 cm) Weight: 175 lb 14.8 oz (79.8 kg) IBW/kg (Calculated) : 86.8 Heparin Dosing Weight: 80 kg  Vital Signs: Temp: 98.7 F (37.1 C) (05/31 2123) Temp Source: Oral (05/31 2123) BP: 143/81 mmHg (05/31 2123) Pulse Rate: 70 (05/31 2123)  Labs:  Recent Labs  01/02/15 0053 01/02/15 0204 01/02/15 1053 01/02/15 1410 01/02/15 1833 01/02/15 2138  HGB 13.3  --   --   --   --   --   HCT 38.3*  --   --   --   --   --   PLT 202  --   --   --   --   --   APTT  --  29  --   --   --   --   LABPROT  --  13.9  --   --   --   --   INR  --  1.05  --   --   --   --   HEPARINUNFRC  --   --   --  0.74*  --  0.65  CREATININE 1.21  --   --   --   --   --   TROPONINI  --   --  <0.03 <0.03 <0.03  --     Estimated Creatinine Clearance: 81.5 mL/min (by C-G formula based on Cr of 1.21).  Assessment: 54 y.o. male with chest pain for heparin   Goal of Therapy:  INR 2-3 Heparin level 0.3-0.7 units/ml Monitor platelets by anticoagulation protocol: Yes   Plan:  Continue Heparin at current rate  Follow-up am labs.    Geannie RisenGreg Rozella Servello, PharmD, BCPS   01/02/2015 10:32 PM

## 2015-01-02 NOTE — ED Notes (Signed)
Dr. Shyrl NumbersNanavanti at the bedside. Clarified, repeat ekg needed at this time.

## 2015-01-02 NOTE — ED Notes (Signed)
Ordered breakfast tray per pt request.

## 2015-01-02 NOTE — Care Management (Signed)
CM received [facility] order for initiation of Eliquis;  however order was cancelled by MD per cardiology recommendation.  No additional CM needs at this time

## 2015-01-03 DIAGNOSIS — R0789 Other chest pain: Secondary | ICD-10-CM

## 2015-01-03 DIAGNOSIS — Z9861 Coronary angioplasty status: Secondary | ICD-10-CM

## 2015-01-03 DIAGNOSIS — R079 Chest pain, unspecified: Secondary | ICD-10-CM | POA: Diagnosis not present

## 2015-01-03 DIAGNOSIS — F329 Major depressive disorder, single episode, unspecified: Secondary | ICD-10-CM | POA: Diagnosis not present

## 2015-01-03 DIAGNOSIS — I252 Old myocardial infarction: Secondary | ICD-10-CM | POA: Diagnosis not present

## 2015-01-03 DIAGNOSIS — I2510 Atherosclerotic heart disease of native coronary artery without angina pectoris: Secondary | ICD-10-CM | POA: Diagnosis not present

## 2015-01-03 LAB — CBC
HCT: 38.4 % — ABNORMAL LOW (ref 39.0–52.0)
HEMOGLOBIN: 13.3 g/dL (ref 13.0–17.0)
MCH: 30.7 pg (ref 26.0–34.0)
MCHC: 34.6 g/dL (ref 30.0–36.0)
MCV: 88.7 fL (ref 78.0–100.0)
Platelets: 207 10*3/uL (ref 150–400)
RBC: 4.33 MIL/uL (ref 4.22–5.81)
RDW: 13 % (ref 11.5–15.5)
WBC: 7.3 10*3/uL (ref 4.0–10.5)

## 2015-01-03 LAB — HEPARIN LEVEL (UNFRACTIONATED): Heparin Unfractionated: 0.56 IU/mL (ref 0.30–0.70)

## 2015-01-03 LAB — PROTIME-INR
INR: 1.09 (ref 0.00–1.49)
PROTHROMBIN TIME: 14.3 s (ref 11.6–15.2)

## 2015-01-03 MED ORDER — LISINOPRIL 5 MG PO TABS: 5.0000 mg | ORAL_TABLET | Freq: Every day | ORAL | Status: DC

## 2015-01-03 MED ORDER — ATORVASTATIN CALCIUM 80 MG PO TABS: 80.0000 mg | ORAL_TABLET | Freq: Every day | ORAL | Status: DC

## 2015-01-03 MED ORDER — WARFARIN SODIUM 7.5 MG PO TABS: 7.5000 mg | ORAL_TABLET | Freq: Every day | ORAL | Status: DC

## 2015-01-03 MED ORDER — MOMETASONE FURO-FORMOTEROL FUM 100-5 MCG/ACT IN AERO: 2.0000 | INHALATION_SPRAY | Freq: Two times a day (BID) | RESPIRATORY_TRACT | Status: DC

## 2015-01-03 NOTE — Discharge Summary (Signed)
Physician Discharge Summary  Timothy Peck ZOX:[id number] DOB: Jun 13, 1961 DOA: 01/02/2015  PCP: Doris Cheadle, MD  Admit date: 01/02/2015 Discharge date: 01/03/2015  Time spent: > 30 minutes  Recommendations for Outpatient Follow-up:  1. Follow up with Dr. Orpah Cobb in 1 month 2. Follow up with Dr. Herbie Baltimore in 2 weeks 3. Continue Coumadin, recommended INR check in 3 days. Per patient preference he will call cardiology office for INR schedule   Discharge Diagnoses:  Principal Problem:   Chest pain, musculoskeletal Active Problems:   Substance abuse   Tobacco abuse   CAD S/P percutaneous coronary angioplasty 02/21/12 non-STEMI -Promus DES 4.0 mm 20 mm to prox LAD   Pulmonary embolism and infarction, 03/13/13, on warfarin   Chronic anticoagulation, since 03/2013 for PE  Discharge Condition: stable  Diet recommendation: heart healthy  Filed Weights   01/02/15 0034 01/02/15 1147  Weight: 79.833 kg (176 lb) 79.8 kg (175 lb 14.8 oz)   History of present illness:  Timothy Peck is a 54 y.o. male, with h/o NSTEMI in July 2013, PCI to prox LAD. Several months later had PE and started on Xarelto. Xarelto was switched to brilinta, which was then changed to coumadin for stroke prophylaxis in the setting of known R ICA stenosis back in 09/21/13, patient with known history on noncompliance and substance abuse, patient presents with complaints of chest pain, worsening shortness of breath, reports chest pain is midsternal, sharp quality, and by coughing and taking deep breath, reports lower abdominal pain, nausea, back pain, patient urine drug screen was positive for cocaine and THC, patient reports no relief with sublingual nitroglycerin, he took 162 mg of aspirin at home, EKG nonacute, troponin negative 1, hospitalist requested to admit for further evaluation.  Hospital Course:  Patient was admitted to the hospital with chest pain and cardiology was consulted. Patient underwent troponin check which was  negative x 3. Per cardiology, there is no need to pursue further ischemic evaluation. Patient's chest pain resolved and was discharged home in stable condition. He was counseled extensively against cocaine use which is likely contributing to his pain. He has a history of PE and is not compliant with his Coumadin. He was initially placed on heparin gtt however is was felt like since his PE is old he does not need bridging. His Coumadin was increased to 7.5 mg daily and he will call [facility]. Patient stated that he has all the information that he needs and he will make the call.   Procedures:  None    Consultations:  Cardiology   Discharge Exam: Filed Vitals:   01/02/15 1147 01/02/15 2040 01/02/15 2123 01/03/15 0502  BP: 154/99  143/81 143/84  Pulse: 62  70 61  Temp: 97.9 F (36.6 C)  98.7 F (37.1 C) 98.7 F (37.1 C)  TempSrc: Oral  Oral Oral  Resp: Height:  (1.93 m)     Weight: 79.8 kg (175 lb 14.8 oz)     SpO2: 98% 98% 100% 100%   General: NAD Cardiovascular: RRR Respiratory: CTA biL  Discharge Instructions    Medication List    STOP taking these medications        enoxaparin 80 MG/0.8ML injection  Commonly known as:  LOVENOX      TAKE these medications        aspirin 81 MG chewable tablet  Chew 1 tablet (81 mg total) by mouth daily.     atorvastatin 80 MG tablet  Commonly known as:  LIPITOR  Take 1 tablet (80 mg total) by mouth daily at 6 PM.     lisinopril 5 MG tablet  Commonly known as:  PRINIVIL,ZESTRIL  Take 1 tablet (5 mg total) by mouth daily.     mometasone-formoterol 100-5 MCG/ACT Aero  Commonly known as:  DULERA  Inhale 2 puffs into the lungs 2 (two) times daily.     warfarin 7.5 MG tablet  Commonly known as:  COUMADIN  Take 1 tablet (7.5 mg total) by mouth daily.           Follow-up Information    Follow up with Doris Cheadle, MD In 1 month.   Specialty:  Internal Medicine   Contact information:   [address] [place] [address] [phone]       Follow up with [facility], Piedad Climes, MD. Schedule an appointment as soon as possible for a visit in 2 weeks.   Specialty:  Cardiology   Contact information:   [address] [place] [address] [phone]       Follow up with [facility] In 3 days.   Specialty:  Cardiology   Why:  Coumadin check   Contact information:   [address], [address] [place] [address] [phone]      The results of significant diagnostics from this hospitalization (including imaging, microbiology, ancillary and laboratory) are listed below for reference.    Significant Diagnostic Studies: Ct Abdomen Pelvis W Contrast  01/02/2015   CLINICAL DATA:  Left lower quadrant abdominal pain. Nausea. Symptoms for 4 days.  EXAM: CT ABDOMEN AND PELVIS WITH CONTRAST  TECHNIQUE: Multidetector CT imaging of the abdomen and pelvis was performed using the standard protocol following bolus administration of intravenous contrast.  CONTRAST:  OMNIPAQUE IOHEXOL 300 MG/ML  SOLN  COMPARISON:  CT 09/20/2014  FINDINGS: Mild hypoventilatory change at the lung bases. The heart size is normal.  Subcapsular cyst in the left lobe of the liver is unchanged. No new hepatic lesion. The gallbladder is decompressed. There is no biliary dilatation. The spleen and adrenal glands are normal. There is no pancreatic ductal dilatation or inflammatory change.  The right kidney is normal. Cyst in the upper left kidney is unchanged. No hydronephrosis or perinephric stranding.  The stomach is decompressed. There are no dilated or thickened bowel loops. Moderate volume of colonic stool without colonic wall thickening. The appendix is normal. No free air, free fluid, or intra-abdominal fluid collection.  No retroperitoneal adenopathy. Abdominal aorta is normal in caliber. There is mild atherosclerosis of the abdominal aorta and its branches.   Within the pelvis the urinary bladder is near completely decompressed. The prostate gland is normal in size. There is no pelvic free fluid or adenopathy.  There are no acute or suspicious osseous abnormalities.  IMPRESSION: 1. No acute abnormality in the abdomen/pelvis. 2. Unchanged hepatic and left renal cysts.   Electronically Signed   By: Rubye Oaks M.D.   On: 01/02/2015 06:18   Dg Chest Port 1 View  01/02/2015   CLINICAL DATA:  Acute onset of shortness of breath and chest pain for 3 days. Initial encounter.  EXAM: PORTABLE CHEST - 1 VIEW  COMPARISON:  Chest radiograph and CTA of the chest performed 09/20/2014  FINDINGS: The lungs are well-aerated and clear. There is no evidence of focal opacification, pleural effusion or pneumothorax.  The cardiomediastinal silhouette is borderline normal in size. No acute osseous  abnormalities are seen. Cervical spinal fusion hardware is noted.  IMPRESSION: No acute cardiopulmonary process seen.   Electronically Signed   By: Roanna RaiderJeffery  Chang M.D.   On: 01/02/2015 02:09   Labs: Basic Metabolic Panel:  Recent Labs Lab 01/02/15 0053  NA 140  K 3.8  CL 109  CO2 26  GLUCOSE 89  BUN 13  CREATININE 1.21  CALCIUM 8.5*   CBC:  Recent Labs Lab 01/02/15 0053 01/03/15 0525  WBC 8.2 7.3  HGB 13.3 13.3  HCT 38.3* 38.4*  MCV 90.1 88.7  PLT 202 207   Cardiac Enzymes:  Recent Labs Lab 01/02/15 1053 01/02/15 1410 01/02/15 1833  TROPONINI <0.03 <0.03 <0.03   BNP: BNP (last 3 results)  Recent Labs  09/20/14 1751 01/02/15 1410  BNP 33.9 101.3*    Signed:  GHERGHE, COSTIN  [facility] 01/03/2015, 5:05 PM

## 2015-01-03 NOTE — Progress Notes (Signed)
D/c instructions reviewed with pt. Copy of instructions and scripts  given to pt. Pt d/c'd via ambulation, steady gait, denies pain or SOB. Pt d/c'd with his belongings.

## 2015-01-15 ENCOUNTER — Telehealth: Payer: Self-pay | Admitting: Cardiology

## 2015-01-15 NOTE — Telephone Encounter (Signed)
Spoke to [facility] need to confirm the information about dental clearance. Confirmed with Fleet Contras of patient's dental clearance.

## 2015-09-30 ENCOUNTER — Emergency Department (HOSPITAL_COMMUNITY)
Admission: EM | Admit: 2015-09-30 | Discharge: 2015-09-30 | Disposition: A | Discharge status: Home / Self Care | Payer: Medicaid Other | Attending: Emergency Medicine | Admitting: Emergency Medicine

## 2015-09-30 ENCOUNTER — Emergency Department (HOSPITAL_COMMUNITY): Payer: Medicaid Other

## 2015-09-30 ENCOUNTER — Emergency Department (HOSPITAL_COMMUNITY)
Admission: EM | Admit: 2015-09-30 | Discharge: 2015-09-30 | Disposition: A | Discharge status: Home / Self Care | Payer: Medicaid Other | Source: Home / Self Care

## 2015-09-30 ENCOUNTER — Encounter (HOSPITAL_COMMUNITY): Payer: Self-pay | Admitting: Emergency Medicine

## 2015-09-30 DIAGNOSIS — R079 Chest pain, unspecified: Secondary | ICD-10-CM

## 2015-09-30 DIAGNOSIS — Z79899 Other long term (current) drug therapy: Secondary | ICD-10-CM | POA: Diagnosis not present

## 2015-09-30 DIAGNOSIS — Z86711 Personal history of pulmonary embolism: Secondary | ICD-10-CM | POA: Diagnosis not present

## 2015-09-30 DIAGNOSIS — F329 Major depressive disorder, single episode, unspecified: Secondary | ICD-10-CM | POA: Diagnosis not present

## 2015-09-30 DIAGNOSIS — I252 Old myocardial infarction: Secondary | ICD-10-CM | POA: Diagnosis not present

## 2015-09-30 DIAGNOSIS — I2510 Atherosclerotic heart disease of native coronary artery without angina pectoris: Secondary | ICD-10-CM | POA: Diagnosis not present

## 2015-09-30 DIAGNOSIS — F17210 Nicotine dependence, cigarettes, uncomplicated: Secondary | ICD-10-CM | POA: Diagnosis not present

## 2015-09-30 LAB — COMPREHENSIVE METABOLIC PANEL
ALT: 22 U/L (ref 17–63)
AST: 30 U/L (ref 15–41)
Albumin: 4 g/dL (ref 3.5–5.0)
Alkaline Phosphatase: 54 U/L (ref 38–126)
Anion gap: 11 (ref 5–15)
BUN: 11 mg/dL (ref 6–20)
CALCIUM: 9.5 mg/dL (ref 8.9–10.3)
CO2: 24 mmol/L (ref 22–32)
CREATININE: 1.34 mg/dL — AB (ref 0.61–1.24)
Chloride: 107 mmol/L (ref 101–111)
GFR, EST NON AFRICAN AMERICAN: 59 mL/min — AB (ref >60)
Glucose, Bld: 99 mg/dL (ref 65–99)
Potassium: 3.9 mmol/L (ref 3.5–5.1)
SODIUM: 142 mmol/L (ref 135–145)
TOTAL PROTEIN: 6.7 g/dL (ref 6.5–8.1)
Total Bilirubin: 0.6 mg/dL (ref 0.3–1.2)

## 2015-09-30 LAB — CBC WITH DIFFERENTIAL/PLATELET
BASOS ABS: 0 10*3/uL (ref 0.0–0.1)
Basophils Relative: 0 %
EOS ABS: 0.2 10*3/uL (ref 0.0–0.7)
EOS PCT: 2 %
HCT: 46 % (ref 39.0–52.0)
Hemoglobin: 16.3 g/dL (ref 13.0–17.0)
LYMPHS ABS: 2 10*3/uL (ref 0.7–4.0)
LYMPHS PCT: 20 %
MCH: 32.1 pg (ref 26.0–34.0)
MCHC: 35.4 g/dL (ref 30.0–36.0)
MCV: 90.6 fL (ref 78.0–100.0)
MONO ABS: 0.6 10*3/uL (ref 0.1–1.0)
Monocytes Relative: 6 %
Neutro Abs: 7.1 10*3/uL (ref 1.7–7.7)
Neutrophils Relative %: 72 %
PLATELETS: 257 10*3/uL (ref 150–400)
RBC: 5.08 MIL/uL (ref 4.22–5.81)
RDW: 12.9 % (ref 11.5–15.5)
WBC: 10 10*3/uL (ref 4.0–10.5)

## 2015-09-30 LAB — I-STAT TROPONIN, ED
TROPONIN I, POC: 0 ng/mL (ref 0.00–0.08)
Troponin i, poc: 0 ng/mL (ref 0.00–0.08)

## 2015-09-30 MED ORDER — WARFARIN SODIUM 7.5 MG PO TABS: 7.5000 mg | ORAL_TABLET | Freq: Every day | ORAL | Status: DC

## 2015-09-30 MED ORDER — ASPIRIN 81 MG PO CHEW
324.0000 mg | CHEWABLE_TABLET | Freq: Once | ORAL | Status: AC
  Administered 2015-09-30: 324 mg via ORAL
  Filled 2015-09-30: qty 4

## 2015-09-30 MED ORDER — NITROGLYCERIN 0.4 MG SL SUBL: 0.4000 mg | SUBLINGUAL_TABLET | SUBLINGUAL | Status: DC | PRN

## 2015-09-30 MED ORDER — ALBUTEROL SULFATE HFA 108 (90 BASE) MCG/ACT IN AERS: 2.0000 | INHALATION_SPRAY | Freq: Once | RESPIRATORY_TRACT | Status: DC

## 2015-09-30 MED ORDER — ATORVASTATIN CALCIUM 80 MG PO TABS
80.0000 mg | ORAL_TABLET | Freq: Every day | ORAL | Status: DC
Start: 2015-09-30 — End: 2016-04-21

## 2015-09-30 MED ORDER — LISINOPRIL 5 MG PO TABS: 5.0000 mg | ORAL_TABLET | Freq: Every day | ORAL | Status: DC

## 2015-09-30 MED ORDER — ONDANSETRON HCL 4 MG/2ML IJ SOLN
4.0000 mg | Freq: Once | INTRAMUSCULAR | Status: AC
  Administered 2015-09-30: 4 mg via INTRAVENOUS
  Filled 2015-09-30: qty 2

## 2015-09-30 MED ORDER — ALBUTEROL SULFATE HFA 108 (90 BASE) MCG/ACT IN AERS
INHALATION_SPRAY | RESPIRATORY_TRACT | Status: AC
  Filled 2015-09-30: qty 6.7

## 2015-09-30 MED ORDER — HYDROMORPHONE HCL 1 MG/ML IJ SOLN
1.0000 mg | Freq: Once | INTRAMUSCULAR | Status: AC
  Administered 2015-09-30: 1 mg via INTRAVENOUS
  Filled 2015-09-30: qty 1

## 2015-09-30 NOTE — ED Notes (Signed)
Pt has not been on his blood thinners.

## 2015-09-30 NOTE — ED Notes (Signed)
Pt from home with c/o chest pain starting last night after smoking crack cocaine.  Pt reports he had been clean for a long time after having a significant cardiac hx, including MI.  Pt reports he used after being asked about a recent death of a family member and became sad.  Pt crying in triage and states "I know I did this to myself."

## 2015-09-30 NOTE — ED Notes (Signed)
The pt wife was asked by [employer] to leave for communicating threats to staff. The pt states if wife has leave he is going to leave with her. Both pt and wife were aggressive and yelling at staff in triage. [employer] and security escorted pt and staff from ED.

## 2015-09-30 NOTE — ED Provider Notes (Signed)
CSN: [id number]     Arrival date & time 09/30/15  6213 History   First MD Initiated Contact with Patient 09/30/15 1001     Chief Complaint  Patient presents with  . Chest Pain     (Consider location/radiation/quality/duration/timing/severity/associated sxs/prior Treatment) Patient is a 55 y.o. male presenting with chest pain. The history is provided by the patient (Patient states he was using cocaine last night started to have chest pain.).  Chest Pain Pain location:  Substernal area Pain quality: aching   Pain radiates to:  Does not radiate Pain radiates to the back: no   Pain severity:  Mild Onset quality:  Sudden Timing:  Constant Progression:  Unchanged Chronicity:  Recurrent Associated symptoms: no abdominal pain, no back pain, no cough, no fatigue and no headache     Past Medical History  Diagnosis Date  . NSTEMI (non-ST elevated myocardial infarction) (HCC)     07/18/13, stable cath with patent stent; preserved EF by echo; follow-up Echo for CVA 09/2013: EF 60%, mild LVH. No RDW and a. No cardiac source for embolism noted. Bubble study was not performed  . CAD S/P percutaneous coronary angioplasty 02/2012    NSTEMI - PCI to prox LAD  . Presence of drug coated stent in LAD coronary artery 02/2012    Promus Element DES 4.0 mm x 28 mm; converted from Brilinta to aspirin during stroke evaluation in February 2015  . NSTEMI (non-ST elevated myocardial infarction), 07/18/13, stable cath with patent stent. 01/31/2012  . Bilateral pulmonary embolism [facility]) August 2014  . 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 Chronic anticoagulation December 2014    Switch from Xarelto to warfarin.  . Depression   . Polysubstance abuse     Past history of cocaine and marijuana, both smoking. Also long standing tobacco abuse.   . Degenerative disc disease, cervical     Dx years ago   Past Surgical History  Procedure Laterality Date  . Tumor removal      left foot third toe  . Anterior  cervical decomp/discectomy fusion  11/28/2011    Procedure: ANTERIOR CERVICAL DECOMPRESSION/DISCECTOMY FUSION 1 LEVEL;  Surgeon: Temple Pacini, MD;  Location: [facility]UR[facility];  Service: Neurosurgery;  Laterality: N/A;  Anterior Cervical Six-Seven Decompression and Fusion  . Cardiac catheterization  01/31/2012    Promeus Element DES 4 x 28, balloon-Emerge monorail  . Cardiac catheterization  12/17/12    patent stent, stable CAD  . Left heart catheterization with coronary angiogram N/A 01/31/2012    Procedure: LEFT HEART CATHETERIZATION WITH CORONARY ANGIOGRAM;  Surgeon: Runell Gess, MD;  Location: [facility] CATH LAB;  Service: Cardiovascular;  Laterality: N/A;  . Percutaneous coronary stent intervention (pci-s) N/A 02/02/2012    Procedure: PERCUTANEOUS CORONARY STENT INTERVENTION (PCI-S);  Surgeon: Marykay Lex, MD;  Location: [facility] CATH LAB;  Service: Cardiovascular;  Laterality: N/A;  . Left heart catheterization with coronary angiogram N/A 12/17/2012    Procedure: LEFT HEART CATHETERIZATION WITH CORONARY ANGIOGRAM;  Surgeon: Marykay Lex, MD;  Location: [facility] CATH LAB;  Service: Cardiovascular;  Laterality: N/A;  . Left heart catheterization with coronary angiogram N/A 07/18/2013    Procedure: LEFT HEART CATHETERIZATION WITH CORONARY ANGIOGRAM;  Surgeon: Marykay Lex, MD;  Location: [facility] CATH LAB;  Service: Cardiovascular;  Laterality: N/A;   Family History  Problem Relation Age of Onset  . Anesthesia problems Neg Hx   . Hypotension Neg Hx   . Malignant hyperthermia Neg Hx   .  Pseudochol deficiency Neg Hx   . Coronary artery disease Father   . Stroke Father 25  . Heart disease Father   . Diabetes Father   . Aneurysm Mother 45    intracranial aneurysm rupture  . Hypertension Mother   . Hypertension Maternal Grandmother   . Diabetes Maternal Grandmother   . Hypertension Paternal Grandmother   . Hypertension Paternal Grandfather   . Cancer Maternal Aunt    Social History  Substance Use Topics   . Smoking status: Current Every Day Smoker -- 1.00 packs/day for 42 years    Types: Cigarettes    Start date: 01/12/1971  . Smokeless tobacco: Never Used  . Alcohol Use: No     Comment: Report being abstinent since ~January 2015    Review of Systems  Constitutional: Negative for appetite change and fatigue.  HENT: Negative for congestion, ear discharge and sinus pressure.   Eyes: Negative for discharge.  Respiratory: Negative for cough.   Cardiovascular: Positive for chest pain.  Gastrointestinal: Negative for abdominal pain and diarrhea.  Genitourinary: Negative for frequency and hematuria.  Musculoskeletal: Negative for back pain.  Skin: Negative for rash.  Neurological: Negative for seizures and headaches.  Psychiatric/Behavioral: Negative for hallucinations.      Allergies  Ibuprofen  Home Medications   Prior to Admission medications   Medication Sig Start Date End Date Taking? Authorizing Provider  aspirin 81 MG chewable tablet Chew 1 tablet (81 mg total) by mouth daily. Patient not taking: Reported on 01/02/2015 09/21/14   Catarina Hartshorn, MD  atorvastatin (LIPITOR) 80 MG tablet Take 1 tablet (80 mg total) by mouth daily. 09/30/15   Bethann Berkshire, MD  lisinopril (PRINIVIL,ZESTRIL) 5 MG tablet Take 1 tablet (5 mg total) by mouth daily. 09/30/15   Bethann Berkshire, MD  mometasone-formoterol [facility]) 100-5 MCG/ACT AERO Inhale 2 puffs into the lungs 2 (two) times daily. Patient not taking: Reported on 09/30/2015 01/03/15   Leatha Gilding, MD  warfarin (COUMADIN) 7.5 MG tablet Take 1 tablet (7.5 mg total) by mouth daily. 09/30/15   Bethann Berkshire, MD   BP 139/101 mmHg  Pulse 70  Resp 12  SpO2 98% Physical Exam  Constitutional: He is oriented to person, place, and time. He appears well-developed.  HENT:  Head: Normocephalic.  Eyes: Conjunctivae and EOM are normal. No scleral icterus.  Neck: Neck supple. No thyromegaly present.  Cardiovascular: Normal rate and regular rhythm.  Exam  reveals no gallop and no friction rub.   No murmur heard. Pulmonary/Chest: No stridor. He has no wheezes. He has no rales. He exhibits no tenderness.  Abdominal: He exhibits no distension. There is no tenderness. There is no rebound.  Musculoskeletal: Normal range of motion. He exhibits no edema.  Lymphadenopathy:    He has no cervical adenopathy.  Neurological: He is oriented to person, place, and time. He exhibits normal muscle tone. Coordination normal.  Skin: No rash noted. No erythema.  Psychiatric: He has a normal mood and affect. His behavior is normal.    ED Course  Procedures (including critical care time) Labs Review Labs Reviewed  COMPREHENSIVE METABOLIC PANEL - Abnormal; Notable for the following:    Creatinine, Ser 1.34 (*)    GFR calc non Af Amer 59 (*)    All other components within normal limits  CBC WITH DIFFERENTIAL/PLATELET  Rosezena Sensor, ED  Rosezena Sensor, ED    Imaging Review Dg Chest 2 View  09/30/2015  CLINICAL DATA:  Chest pain EXAM: CHEST  2  VIEW COMPARISON:  01/02/2015 FINDINGS: Heart and mediastinal contours are within normal limits. No focal opacities or effusions. No acute bony abnormality. IMPRESSION: No active cardiopulmonary disease. Electronically Signed   By: Charlett Nose M.D.   On: 09/30/2015 11:01   I have personally reviewed and evaluated these images and lab results as part of my medical decision-making.   EKG Interpretation   Date/Time:  Sunday September 30 2015 10:10:48 EST Ventricular Rate:  60 PR Interval:  140 QRS Duration: 81 QT Interval:  420 QTC Calculation: 420 R Axis:   82 Text Interpretation:  Sinus rhythm Biatrial enlargement ST elev, probable  normal early repol pattern Confirmed by Miller Limehouse  MD, Forever Arechiga [phone]) on  09/30/2015 10:17:16 AM Also confirmed by Nellie Chevalier  MD, Jomarie Longs [phone])  on  09/30/2015 2:46:40 PM      MDM   Final diagnoses:  Chest pain at rest    Patient with chest pain cause from cocaine abuse.  Labs unremarkable including 2 negative troponins chest x-ray unremarkable. A shunt symptoms improved with treatment in the emergency department. He is given new prescriptions for Coumadin lisinopril and Lipitor and will follow-up with his PCP. He was instructed not use cocaine anymore    Bethann Berkshire, MD 09/30/15 1454

## 2015-09-30 NOTE — Discharge Instructions (Signed)
Stop using cocaine and follow-up with her doctor this week

## 2015-11-28 ENCOUNTER — Encounter (HOSPITAL_COMMUNITY): Payer: Self-pay | Admitting: Vascular Surgery

## 2015-11-28 ENCOUNTER — Emergency Department (HOSPITAL_COMMUNITY)
Admission: EM | Admit: 2015-11-28 | Discharge: 2015-11-28 | Discharge status: Against Medical Advice | Payer: Medicaid Other | Attending: Emergency Medicine | Admitting: Emergency Medicine

## 2015-11-28 DIAGNOSIS — Z8739 Personal history of other diseases of the musculoskeletal system and connective tissue: Secondary | ICD-10-CM | POA: Insufficient documentation

## 2015-11-28 DIAGNOSIS — I2510 Atherosclerotic heart disease of native coronary artery without angina pectoris: Secondary | ICD-10-CM | POA: Insufficient documentation

## 2015-11-28 DIAGNOSIS — I252 Old myocardial infarction: Secondary | ICD-10-CM | POA: Insufficient documentation

## 2015-11-28 DIAGNOSIS — R079 Chest pain, unspecified: Secondary | ICD-10-CM | POA: Diagnosis not present

## 2015-11-28 DIAGNOSIS — Z8659 Personal history of other mental and behavioral disorders: Secondary | ICD-10-CM | POA: Insufficient documentation

## 2015-11-28 DIAGNOSIS — F17210 Nicotine dependence, cigarettes, uncomplicated: Secondary | ICD-10-CM | POA: Diagnosis not present

## 2015-11-28 DIAGNOSIS — Z86711 Personal history of pulmonary embolism: Secondary | ICD-10-CM | POA: Insufficient documentation

## 2015-11-28 DIAGNOSIS — Z79899 Other long term (current) drug therapy: Secondary | ICD-10-CM | POA: Diagnosis not present

## 2015-11-28 DIAGNOSIS — J45901 Unspecified asthma with (acute) exacerbation: Secondary | ICD-10-CM | POA: Diagnosis not present

## 2015-11-28 DIAGNOSIS — Z9889 Other specified postprocedural states: Secondary | ICD-10-CM | POA: Insufficient documentation

## 2015-11-28 DIAGNOSIS — Z7982 Long term (current) use of aspirin: Secondary | ICD-10-CM | POA: Insufficient documentation

## 2015-11-28 DIAGNOSIS — Z955 Presence of coronary angioplasty implant and graft: Secondary | ICD-10-CM | POA: Diagnosis not present

## 2015-11-28 DIAGNOSIS — Z760 Encounter for issue of repeat prescription: Secondary | ICD-10-CM | POA: Insufficient documentation

## 2015-11-28 DIAGNOSIS — Z8673 Personal history of transient ischemic attack (TIA), and cerebral infarction without residual deficits: Secondary | ICD-10-CM | POA: Diagnosis not present

## 2015-11-28 DIAGNOSIS — Z7901 Long term (current) use of anticoagulants: Secondary | ICD-10-CM | POA: Diagnosis not present

## 2015-11-28 DIAGNOSIS — R0602 Shortness of breath: Secondary | ICD-10-CM

## 2015-11-28 MED ORDER — ALBUTEROL SULFATE HFA 108 (90 BASE) MCG/ACT IN AERS
2.0000 | INHALATION_SPRAY | Freq: Once | RESPIRATORY_TRACT | Status: AC
  Administered 2015-11-28: 2 via RESPIRATORY_TRACT
  Filled 2015-11-28: qty 6.7

## 2015-11-28 NOTE — ED Notes (Signed)
Pt leaving AMA, does not wish to continue treatment, ambulatory without distress

## 2015-11-28 NOTE — ED Notes (Signed)
Pt reports to the ED for medication refill. Pt reports he has been using his inhaler more often and now he has run out and he states he needs a new inhaler. No wheezing noted. Pt A&Ox4, resp e/u, and skin warm and dry. Has cardiac hx but does not want/refuses cardiac workup.

## 2015-11-28 NOTE — ED Notes (Signed)
Only wants inhaler, nothing else

## 2015-11-29 NOTE — ED Provider Notes (Signed)
CSN: [id number]     Arrival date & time 11/28/15  2144 History   First MD Initiated Contact with Patient 11/28/15 2223     Chief Complaint  Patient presents with  . Asthma     (Consider location/radiation/quality/duration/timing/severity/associated sxs/prior Treatment) HPI   Timothy Peck is a 55 year old male with complex medical history including tobacco abuse, currently smoking, history of polysubstance abuse, in STEMI with drug-eluting stent, bilateral pulmonary embolism, CVA, asthma, patient presented to the emergency department requesting a refill of his inhaler. He states he has been using his inhaler more often but recently ran out.  He in nurse's shortness of breath, left-sided chest pain. He states that he has been out of most of his medications including his warfarin, and is currently homeless, living in his car with his girlfriend.  He does not want to have any workup done today, because he states he cannot afford to be admitted, and he is here with his girlfriend because they're going to move into a place to stay on Monday, 5 days from now.  Patient is not compliant with providing further history, requesting to leave, refuses to have any workup done because he knows he has multiple serious medical conditions.   Past Medical History  Diagnosis Date  . NSTEMI (non-ST elevated myocardial infarction) (HCC)     07/18/13, stable cath with patent stent; preserved EF by echo; follow-up Echo for CVA 09/2013: EF 60%, mild LVH. No RDW and a. No cardiac source for embolism noted. Bubble study was not performed  . CAD S/P percutaneous coronary angioplasty 02/2012    NSTEMI - PCI to prox LAD  . Presence of drug coated stent in LAD coronary artery 02/2012    Promus Element DES 4.0 mm x 28 mm; converted from Brilinta to aspirin during stroke evaluation in February 2015  . NSTEMI (non-ST elevated myocardial infarction), 07/18/13, stable cath with patent stent. 01/31/2012  . Bilateral pulmonary embolism  [facility]) August 2014  . 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 Chronic anticoagulation December 2014    Switch from Xarelto to warfarin.  . Depression   . Polysubstance abuse     Past history of cocaine and marijuana, both smoking. Also long standing tobacco abuse.   . Degenerative disc disease, cervical     Dx years ago   Past Surgical History  Procedure Laterality Date  . Tumor removal      left foot third toe  . Anterior cervical decomp/discectomy fusion  11/28/2011    Procedure: ANTERIOR CERVICAL DECOMPRESSION/DISCECTOMY FUSION 1 LEVEL;  Surgeon: Temple Pacini, MD;  Location: [facility];  Service: Neurosurgery;  Laterality: N/A;  Anterior Cervical Six-Seven Decompression and Fusion  . Cardiac catheterization  01/31/2012    Promeus Element DES 4 x 28, balloon-Emerge monorail  . Cardiac catheterization  12/17/12    patent stent, stable CAD  . Left heart catheterization with coronary angiogram N/A 01/31/2012    Procedure: LEFT HEART CATHETERIZATION WITH CORONARY ANGIOGRAM;  Surgeon: Runell Gess, MD;  Location: [facility] CATH LAB;  Service: Cardiovascular;  Laterality: N/A;  . Percutaneous coronary stent intervention (pci-s) N/A 02/02/2012    Procedure: PERCUTANEOUS CORONARY STENT INTERVENTION (PCI-S);  Surgeon: Marykay Lex, MD;  Location[facility] CATH LAB;  Service: Cardiovascular;  Laterality: N/A;  . Left heart catheterization with coronary angiogram N/A 12/17/2012    Procedure: LEFT HEART CATHETERIZATION WITH CORONARY ANGIOGRAM;  Surgeon: Marykay Lex, MD;  Location: [facility] CATH LAB;  Service: Cardiovascular;  Laterality: N/A;  . Left heart catheterization with coronary angiogram N/A 07/18/2013    Procedure: LEFT HEART CATHETERIZATION WITH CORONARY ANGIOGRAM;  Surgeon: Marykay Lex, MD;  Location: [facility] CATH LAB;  Service: Cardiovascular;  Laterality: N/A;   Family History  Problem Relation Age of Onset  . Anesthesia problems Neg Hx   . Hypotension Neg Hx   . Malignant hyperthermia  Neg Hx   . Pseudochol deficiency Neg Hx   . Coronary artery disease Father   . Stroke Father 13  . Heart disease Father   . Diabetes Father   . Aneurysm Mother 45    intracranial aneurysm rupture  . Hypertension Mother   . Hypertension Maternal Grandmother   . Diabetes Maternal Grandmother   . Hypertension Paternal Grandmother   . Hypertension Paternal Grandfather   . Cancer Maternal Aunt    Social History  Substance Use Topics  . Smoking status: Current Every Day Smoker -- 1.00 packs/day for 42 years    Types: Cigarettes    Start date: 01/12/1971  . Smokeless tobacco: Never Used  . Alcohol Use: No     Comment: Report being abstinent since ~January 2015    Review of Systems  All other systems reviewed and are negative.     Allergies  Ibuprofen  Home Medications   Prior to Admission medications   Medication Sig Start Date End Date Taking? Authorizing Provider  aspirin 81 MG chewable tablet Chew 1 tablet (81 mg total) by mouth daily. Patient not taking: Reported on 01/02/2015 09/21/14   Catarina Hartshorn, MD  atorvastatin (LIPITOR) 80 MG tablet Take 1 tablet (80 mg total) by mouth daily. 09/30/15   Bethann Berkshire, MD  lisinopril (PRINIVIL,ZESTRIL) 5 MG tablet Take 1 tablet (5 mg total) by mouth daily. 09/30/15   Bethann Berkshire, MD  mometasone-formoterol [facility]) 100-5 MCG/ACT AERO Inhale 2 puffs into the lungs 2 (two) times daily. Patient not taking: Reported on 09/30/2015 01/03/15   Leatha Gilding, MD  warfarin (COUMADIN) 7.5 MG tablet Take 1 tablet (7.5 mg total) by mouth daily. 09/30/15   Bethann Berkshire, MD   BP 139/98 mmHg  Pulse 56  Temp(Src) 98.4 F (36.9 C) (Oral)  Resp 16  Ht  (1.93 m)  Wt 78.16 kg  BMI 20.98 kg/m2  SpO2 100% Physical Exam  Constitutional: He is oriented to person, place, and time. He appears well-developed.  Non-toxic appearance. No distress.  Chronically ill-appearing male, thin, appears older than stated age, nontoxic in appearance, NAD  HENT:   Head: Normocephalic and atraumatic.  Nose: Nose normal.  Mouth/Throat: Oropharynx is clear and moist. No oropharyngeal exudate.  Eyes: Conjunctivae and EOM are normal. Pupils are equal, round, and reactive to light.  Neck: Trachea normal, normal range of motion and phonation normal. Neck supple. No JVD present.  Cardiovascular: Normal rate.  An irregular rhythm present. Exam reveals no gallop and no friction rub.   No murmur heard. Pulses:      Radial pulses are 2+ on the right side, and 2+ on the left side.  Pulmonary/Chest: Effort normal. He exhibits no tenderness.  Severely diminished breath sounds, no tachypnea, no accessory muscle use, no obvious respiratory distress, no clubbing, no cyanosis  Abdominal: Soft. He exhibits no distension.  Musculoskeletal: Normal range of motion.  Neurological: He is alert and oriented to person, place, and time. No cranial nerve deficit. He exhibits abnormal muscle tone. Coordination normal.  Skin: Skin is warm. No rash noted. He is not diaphoretic.  No erythema. No pallor.  Psychiatric: He has a normal mood and affect. His behavior is normal.  Nursing note and vitals reviewed.   ED Course  Procedures (including critical care time) Labs Review Labs Reviewed - No data to display  Imaging Review No results found. I have personally reviewed and evaluated these images and lab results as part of my medical decision-making.   EKG Interpretation None      MDM   The patient presented with complaints of asthma and requesting an inhaler, at triage he refused any other workup. Upon my evaluation he has multiple symptoms and medical history which are concerning, including but not limited to left-sided chest pain, medical noncompliance due to financial constraints, he is unable to afford any of his medications and history of PEs, is supposed to be on Coumadin.  He refuses to provide answer to history questions. He is very pleasant and appropriate while  doing so but explains that his girlfriend is asleep outside in the car where they're currently living, she has been worried about him lately and he told her he would go into the hospital and get an inhaler so that he breathes better. He suspects that it is his PEs, and he verbalizes understanding of the risk of mortality and morbidity however this time does not wish to have any testing done, is refusing chest x-ray, EKG, labs.  On exam he has severely diminished breath sounds throughout however he does not have respiratory distress he is not tachypneic and he has no desaturation on pulse ox monitor.  An inhaler was given to him in the ER. He was given multiple chances to reconsider and allow proper evaluation of his chest pain complaint however he wished to leave. He stated he would come back to the ER if he got worse. He was instructed to come back at any time with shortness of breath or chest pain. Again he understands the risks of leaving AGAINST MEDICAL ADVICE.  Patient wants to leave against medical advice. Patient understands that his/her actions will lead to inadequate medical workup, and that he/she is at risk of complications of missed diagnosis, which includes morbidity and mortality.  Patient is demonstrating good capacity to make decision. Patient understands that he/she needs to return to the ER immediately if his/her symptoms get worse.    Final diagnoses:  Encounter for medication refill  Chest pain, unspecified chest pain type  SOB (shortness of breath)      Danelle BerryLeisa Mckynna Vanloan, PA-C 11/29/15 0246  Tilden FossaElizabeth Rees, MD 12/01/15 1440

## 2015-12-13 ENCOUNTER — Encounter (HOSPITAL_COMMUNITY): Payer: Self-pay | Admitting: *Deleted

## 2015-12-13 ENCOUNTER — Other Ambulatory Visit: Payer: Self-pay

## 2015-12-13 ENCOUNTER — Emergency Department (HOSPITAL_COMMUNITY): Payer: Medicaid Other

## 2015-12-13 ENCOUNTER — Emergency Department (HOSPITAL_COMMUNITY)
Admission: EM | Admit: 2015-12-13 | Discharge: 2015-12-14 | Disposition: A | Discharge status: Home / Self Care | Payer: Medicaid Other | Attending: Emergency Medicine | Admitting: Emergency Medicine

## 2015-12-13 DIAGNOSIS — R05 Cough: Secondary | ICD-10-CM | POA: Insufficient documentation

## 2015-12-13 DIAGNOSIS — I252 Old myocardial infarction: Secondary | ICD-10-CM | POA: Diagnosis not present

## 2015-12-13 DIAGNOSIS — I2510 Atherosclerotic heart disease of native coronary artery without angina pectoris: Secondary | ICD-10-CM | POA: Insufficient documentation

## 2015-12-13 DIAGNOSIS — R0602 Shortness of breath: Secondary | ICD-10-CM | POA: Diagnosis not present

## 2015-12-13 DIAGNOSIS — F17210 Nicotine dependence, cigarettes, uncomplicated: Secondary | ICD-10-CM | POA: Insufficient documentation

## 2015-12-13 MED ORDER — ALBUTEROL SULFATE (2.5 MG/3ML) 0.083% IN NEBU
INHALATION_SOLUTION | RESPIRATORY_TRACT | Status: AC
  Filled 2015-12-13: qty 3

## 2015-12-13 MED ORDER — ALBUTEROL SULFATE (2.5 MG/3ML) 0.083% IN NEBU
5.0000 mg | INHALATION_SOLUTION | Freq: Once | RESPIRATORY_TRACT | Status: AC
  Administered 2015-12-13: 5 mg via RESPIRATORY_TRACT

## 2015-12-13 NOTE — ED Notes (Addendum)
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 Pt stated that he "will return and recheck in". Informed Woody Metallurgist- Charge RN.

## 2015-12-13 NOTE — ED Notes (Signed)
Increasing SOB over the past couple of days.  Congestion noted productive cough yellowish tan

## 2016-03-09 ENCOUNTER — Encounter (HOSPITAL_COMMUNITY): Payer: Self-pay | Admitting: *Deleted

## 2016-03-09 ENCOUNTER — Emergency Department (HOSPITAL_COMMUNITY): Payer: Medicaid Other

## 2016-03-09 ENCOUNTER — Observation Stay (HOSPITAL_COMMUNITY)
Admission: EM | Admit: 2016-03-09 | Discharge: 2016-03-10 | Disposition: A | Discharge status: Home / Self Care | Payer: Medicaid Other | Attending: Family Medicine | Admitting: Family Medicine

## 2016-03-09 ENCOUNTER — Inpatient Hospital Stay (HOSPITAL_COMMUNITY): Payer: Medicaid Other

## 2016-03-09 DIAGNOSIS — F17210 Nicotine dependence, cigarettes, uncomplicated: Secondary | ICD-10-CM | POA: Insufficient documentation

## 2016-03-09 DIAGNOSIS — R0602 Shortness of breath: Secondary | ICD-10-CM

## 2016-03-09 DIAGNOSIS — J45909 Unspecified asthma, uncomplicated: Secondary | ICD-10-CM | POA: Diagnosis not present

## 2016-03-09 DIAGNOSIS — I209 Angina pectoris, unspecified: Secondary | ICD-10-CM | POA: Diagnosis not present

## 2016-03-09 DIAGNOSIS — F1410 Cocaine abuse, uncomplicated: Secondary | ICD-10-CM

## 2016-03-09 DIAGNOSIS — Z7982 Long term (current) use of aspirin: Secondary | ICD-10-CM | POA: Insufficient documentation

## 2016-03-09 DIAGNOSIS — Z9114 Patient's other noncompliance with medication regimen: Secondary | ICD-10-CM | POA: Diagnosis not present

## 2016-03-09 DIAGNOSIS — I2510 Atherosclerotic heart disease of native coronary artery without angina pectoris: Secondary | ICD-10-CM | POA: Insufficient documentation

## 2016-03-09 DIAGNOSIS — M6289 Other specified disorders of muscle: Secondary | ICD-10-CM | POA: Diagnosis not present

## 2016-03-09 DIAGNOSIS — I10 Essential (primary) hypertension: Secondary | ICD-10-CM | POA: Insufficient documentation

## 2016-03-09 DIAGNOSIS — I252 Old myocardial infarction: Secondary | ICD-10-CM | POA: Diagnosis not present

## 2016-03-09 DIAGNOSIS — Z7901 Long term (current) use of anticoagulants: Secondary | ICD-10-CM | POA: Insufficient documentation

## 2016-03-09 DIAGNOSIS — Z8673 Personal history of transient ischemic attack (TIA), and cerebral infarction without residual deficits: Secondary | ICD-10-CM | POA: Diagnosis not present

## 2016-03-09 DIAGNOSIS — R079 Chest pain, unspecified: Secondary | ICD-10-CM | POA: Diagnosis present

## 2016-03-09 DIAGNOSIS — Z9119 Patient's noncompliance with other medical treatment and regimen: Secondary | ICD-10-CM | POA: Insufficient documentation

## 2016-03-09 DIAGNOSIS — Z79899 Other long term (current) drug therapy: Secondary | ICD-10-CM | POA: Insufficient documentation

## 2016-03-09 DIAGNOSIS — R531 Weakness: Secondary | ICD-10-CM

## 2016-03-09 DIAGNOSIS — Z91148 Patient's other noncompliance with medication regimen for other reason: Secondary | ICD-10-CM

## 2016-03-09 HISTORY — DX: Essential (primary) hypertension: I10

## 2016-03-09 HISTORY — DX: Unspecified asthma, uncomplicated: J45.909

## 2016-03-09 LAB — BASIC METABOLIC PANEL
Anion gap: 5 (ref 5–15)
BUN: 13 mg/dL (ref 6–20)
CHLORIDE: 105 mmol/L (ref 101–111)
CO2: 26 mmol/L (ref 22–32)
Calcium: 8.4 mg/dL — ABNORMAL LOW (ref 8.9–10.3)
Creatinine, Ser: 1.39 mg/dL — ABNORMAL HIGH (ref 0.61–1.24)
GFR calc Af Amer: >60 mL/min (ref >60)
GFR calc non Af Amer: 56 mL/min — ABNORMAL LOW (ref >60)
GLUCOSE: 88 mg/dL (ref 65–99)
POTASSIUM: 3.8 mmol/L (ref 3.5–5.1)
Sodium: 136 mmol/L (ref 135–145)

## 2016-03-09 LAB — RAPID URINE DRUG SCREEN, HOSP PERFORMED
AMPHETAMINES: NOT DETECTED
Barbiturates: NOT DETECTED
Benzodiazepines: NOT DETECTED
Cocaine: POSITIVE — AB
Opiates: NOT DETECTED
TETRAHYDROCANNABINOL: POSITIVE — AB

## 2016-03-09 LAB — CBC
HEMATOCRIT: 40.9 % (ref 39.0–52.0)
Hemoglobin: 13.6 g/dL (ref 13.0–17.0)
MCH: 30.8 pg (ref 26.0–34.0)
MCHC: 33.3 g/dL (ref 30.0–36.0)
MCV: 92.7 fL (ref 78.0–100.0)
Platelets: 236 10*3/uL (ref 150–400)
RBC: 4.41 MIL/uL (ref 4.22–5.81)
RDW: 13 % (ref 11.5–15.5)
WBC: 5.4 10*3/uL (ref 4.0–10.5)

## 2016-03-09 LAB — I-STAT TROPONIN, ED: Troponin i, poc: 0.01 ng/mL (ref 0.00–0.08)

## 2016-03-09 LAB — POC OCCULT BLOOD, ED: Fecal Occult Bld: NEGATIVE

## 2016-03-09 LAB — PROTIME-INR
INR: 0.98
PROTHROMBIN TIME: 13 s (ref 11.4–15.2)

## 2016-03-09 LAB — TROPONIN I: Troponin I: <0.03 ng/mL (ref <0.03)

## 2016-03-09 MED ORDER — THIAMINE HCL 100 MG/ML IJ SOLN: 100.0000 mg | Freq: Every day | INTRAMUSCULAR | Status: DC

## 2016-03-09 MED ORDER — ENOXAPARIN SODIUM 40 MG/0.4ML ~~LOC~~ SOLN
40.0000 mg | SUBCUTANEOUS | Status: DC
Start: 2016-03-09 — End: 2016-03-10
  Administered 2016-03-09: 40 mg via SUBCUTANEOUS
  Filled 2016-03-09: qty 0.4

## 2016-03-09 MED ORDER — FOLIC ACID 1 MG PO TABS
1.0000 mg | ORAL_TABLET | Freq: Every day | ORAL | Status: DC
  Administered 2016-03-09 – 2016-03-10 (×2): 1 mg via ORAL
  Filled 2016-03-09 (×2): qty 1

## 2016-03-09 MED ORDER — ONDANSETRON HCL 4 MG/2ML IJ SOLN: 4.0000 mg | Freq: Four times a day (QID) | INTRAMUSCULAR | Status: DC | PRN

## 2016-03-09 MED ORDER — LORAZEPAM 2 MG/ML IJ SOLN: 1.0000 mg | Freq: Four times a day (QID) | INTRAMUSCULAR | Status: DC | PRN

## 2016-03-09 MED ORDER — ASPIRIN 81 MG PO CHEW: 81.0000 mg | CHEWABLE_TABLET | Freq: Every day | ORAL | Status: DC

## 2016-03-09 MED ORDER — LORAZEPAM 1 MG PO TABS: 1.0000 mg | ORAL_TABLET | Freq: Four times a day (QID) | ORAL | Status: DC | PRN

## 2016-03-09 MED ORDER — ADULT MULTIVITAMIN W/MINERALS CH
1.0000 | ORAL_TABLET | Freq: Every day | ORAL | Status: DC
  Administered 2016-03-09 – 2016-03-10 (×2): 1 via ORAL
  Filled 2016-03-09 (×2): qty 1

## 2016-03-09 MED ORDER — LISINOPRIL 5 MG PO TABS
5.0000 mg | ORAL_TABLET | Freq: Every day | ORAL | Status: DC
  Administered 2016-03-10: 5 mg via ORAL
  Filled 2016-03-09: qty 1

## 2016-03-09 MED ORDER — VITAMIN B-1 100 MG PO TABS
100.0000 mg | ORAL_TABLET | Freq: Every day | ORAL | Status: DC
  Administered 2016-03-09 – 2016-03-10 (×2): 100 mg via ORAL
  Filled 2016-03-09 (×2): qty 1

## 2016-03-09 MED ORDER — ASPIRIN 81 MG PO CHEW
81.0000 mg | CHEWABLE_TABLET | Freq: Every day | ORAL | Status: DC
  Administered 2016-03-10: 81 mg via ORAL
  Filled 2016-03-09: qty 1

## 2016-03-09 MED ORDER — IOPAMIDOL (ISOVUE-370) INJECTION 76%
INTRAVENOUS | Status: AC
  Administered 2016-03-09: 100 mL
  Filled 2016-03-09: qty 100

## 2016-03-09 MED ORDER — MORPHINE SULFATE (PF) 4 MG/ML IV SOLN
4.0000 mg | Freq: Once | INTRAVENOUS | Status: AC
  Administered 2016-03-09: 4 mg via INTRAVENOUS
  Filled 2016-03-09: qty 1

## 2016-03-09 MED ORDER — ASPIRIN 81 MG PO CHEW
324.0000 mg | CHEWABLE_TABLET | Freq: Once | ORAL | Status: AC
  Administered 2016-03-09: 324 mg via ORAL
  Filled 2016-03-09: qty 4

## 2016-03-09 MED ORDER — ATORVASTATIN CALCIUM 80 MG PO TABS
80.0000 mg | ORAL_TABLET | Freq: Every day | ORAL | Status: DC
  Administered 2016-03-09: 80 mg via ORAL
  Filled 2016-03-09: qty 1

## 2016-03-09 MED ORDER — GI COCKTAIL ~~LOC~~
30.0000 mL | Freq: Three times a day (TID) | ORAL | Status: DC | PRN
Start: 2016-03-09 — End: 2016-03-10

## 2016-03-09 MED ORDER — ACETAMINOPHEN 325 MG PO TABS
650.0000 mg | ORAL_TABLET | ORAL | Status: DC | PRN
  Administered 2016-03-10: 650 mg via ORAL
  Filled 2016-03-09: qty 2

## 2016-03-09 NOTE — ED Notes (Signed)
Patient transported to CT 

## 2016-03-09 NOTE — ED Triage Notes (Addendum)
Pt reports ongoing chest pains that have become more severe recently. Has cardiac history and has ran out of his coumadin and bp meds. Reports sob. ekg done at triage and airway intact.

## 2016-03-09 NOTE — ED Provider Notes (Signed)
[facility]-EMERGENCY DEPT Provider Note   CSN: [id number] Arrival date & time: 03/09/16  1053  First Provider Contact:  First MD Initiated Contact with Patient 03/09/16 1114        History   Chief Complaint Chief Complaint  Patient presents with  . Chest Pain    HPI Timothy Peck is a 55 y.o. male.  Timothy Peck is a 55 y.o. 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 Patient states he has been without his medications including lisinopril, lipitor, and coumadin for one month. He states approximately 3.5 weeks ago he started experiencing intermittent chest pain - no discernable pattern for when the chest pain would start. Approximately 2 weeks ago the chest pain became constant. Approximately one week ago he states chest pain was as if "bricks" were on his chest. Today he complains of "someone sitting on my chest." Pain is currently 8.5-9/10. He has associated shortness of breath, nausea, lightheadedness, palpitations, intermittent left arm numbness, and diaphoresis. He denies lower leg swelling or pain. No aggravating or alleviating factors. No treatments tried.     The history is provided by the patient and medical records.    Past Medical History:  Diagnosis Date  . Asthma   . Bilateral pulmonary embolism [facility]) August 2014  . CAD S/P percutaneous coronary angioplasty 02/2012   NSTEMI - PCI to prox LAD  . Chronic anticoagulation December 2014   Switch from Xarelto to warfarin.  . Degenerative disc disease, cervical    Dx years ago  . Depression   . 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 Hypertension   . NSTEMI (non-ST elevated myocardial infarction) (HCC)    07/18/13, stable cath with patent stent; preserved EF by echo; follow-up Echo for CVA 09/2013: EF 60%,  mild LVH. No RDW and a. No cardiac source for embolism noted. Bubble study was not performed  . NSTEMI (non-ST elevated myocardial infarction), 07/18/13, stable cath with patent stent. 01/31/2012  . Polysubstance abuse    Past history of cocaine and marijuana, both smoking. Also long standing tobacco abuse.   . Presence of drug coated stent in LAD coronary artery 02/2012   Promus Element DES 4.0 mm x 28 mm; converted from Brilinta to aspirin during stroke evaluation in February 2015    Patient Active Problem List   Diagnosis Date Noted  . Chest pain with moderate risk for cardiac etiology 10/04/2014  . Malnutrition of moderate degree (HCC) 09/22/2014  . Personal history of noncompliance with medical treatment 09/21/2014  . History of arterial ischemic stroke 09/20/2014  . 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 Long term current use of anticoagulant therapy 10/11/2013  . Acute ischemic stroke (HCC) 09/21/2013  . Chronic anticoagulation, since 03/2013 for PE 07/19/2013  . Acute pericarditis, unspecified 07/19/2013    Class: Question of  . Chest pain, musculoskeletal 07/18/2013  . Pulmonary embolism and infarction, 03/13/13, on warfarin 03/14/2013  . Acute respiratory failure with hypoxia (HCC) 03/14/2013  . Blood in stool, resolved 01/11/2013  . Chest pain 12/22/2012  . CAD S/P percutaneous coronary angioplasty 02/21/12 non-STEMI -Promus DES 4.0 mm 20 mm to prox LAD 12/14/2012    Class: Diagnosis of  . Dyslipidemia, goal LDL below 70 12/14/2012  . Bradycardia 02/03/2012  . NSTEMI (non-ST elevated myocardial  infarction), 07/18/13, stable cath with patent stent. 01/31/2012  . Substance abuse 01/31/2012  . Alcohol abuse 01/31/2012  . Tobacco abuse 01/31/2012  . Herniation of cervical intervertebral disc with radiculopathy 11/28/2011    Past Surgical History:  Procedure Laterality Date  . ANTERIOR CERVICAL DECOMP/DISCECTOMY FUSION  11/28/2011     Procedure: ANTERIOR CERVICAL DECOMPRESSION/DISCECTOMY FUSION 1 LEVEL;  Surgeon: Temple Pacini, MD;  Location: [facility];  Service: Neurosurgery;  Laterality: N/A;  Anterior Cervical Six-Seven Decompression and Fusion  . CARDIAC CATHETERIZATION  01/31/2012   Promeus Element DES 4 x 28, balloon-Emerge monorail  . CARDIAC CATHETERIZATION  12/17/12   patent stent, stable CAD  . LEFT HEART CATHETERIZATION WITH CORONARY ANGIOGRAM N/A 01/31/2012   Procedure: LEFT HEART CATHETERIZATION WITH CORONARY ANGIOGRAM;  Surgeon: Runell Gess, MD;  Location: [facility] CATH LAB;  Service: Cardiovascular;  Laterality: N/A;  . LEFT HEART CATHETERIZATION WITH CORONARY ANGIOGRAM N/A 12/17/2012   Procedure: LEFT HEART CATHETERIZATION WITH CORONARY ANGIOGRAM;  Surgeon: Marykay Lex, MD;  Location: [facility] CATH LAB;  Service: Cardiovascular;  Laterality: N/A;  . LEFT HEART CATHETERIZATION WITH CORONARY ANGIOGRAM N/A 07/18/2013   Procedure: LEFT HEART CATHETERIZATION WITH CORONARY ANGIOGRAM;  Surgeon: Marykay Lex, MD;  Location: [facility] CATH LAB;  Service: Cardiovascular;  Laterality: N/A;  . PERCUTANEOUS CORONARY STENT INTERVENTION (PCI-S) N/A 02/02/2012   Procedure: PERCUTANEOUS CORONARY STENT INTERVENTION (PCI-S);  Surgeon: Marykay Lex, MD;  Location: [facility] CATH LAB;  Service: Cardiovascular;  Laterality: N/A;  . TUMOR REMOVAL     left foot third toe       Home Medications    Prior to Admission medications   Medication Sig Start Date End Date Taking? Authorizing Provider  aspirin 81 MG chewable tablet Chew 1 tablet (81 mg total) by mouth daily. Patient not taking: Reported on 01/02/2015 09/21/14   Catarina Hartshorn, MD  atorvastatin (LIPITOR) 80 MG tablet Take 1 tablet (80 mg total) by mouth daily. Patient not taking: Reported on 03/09/2016 09/30/15   Bethann Berkshire, MD  lisinopril (PRINIVIL,ZESTRIL) 5 MG tablet Take 1 tablet (5 mg total) by mouth daily. Patient not taking: Reported on 03/09/2016 09/30/15   Bethann Berkshire, MD   mometasone-formoterol [facility]) 100-5 MCG/ACT AERO Inhale 2 puffs into the lungs 2 (two) times daily. Patient not taking: Reported on 09/30/2015 01/03/15   Leatha Gilding, MD  warfarin (COUMADIN) 7.5 MG tablet Take 1 tablet (7.5 mg total) by mouth daily. Patient not taking: Reported on 03/09/2016 09/30/15   Bethann Berkshire, MD    Family History Family History  Problem Relation Age of Onset  . Coronary artery disease Father   . Stroke Father 92  . Heart disease Father   . Diabetes Father   . Aneurysm Mother 45    intracranial aneurysm rupture  . Hypertension Mother   . Hypertension Maternal Grandmother   . Diabetes Maternal Grandmother   . Hypertension Paternal Grandmother   . Hypertension Paternal Grandfather   . Cancer Maternal Aunt   . Anesthesia problems Neg Hx   . Hypotension Neg Hx   . Malignant hyperthermia Neg Hx   . Pseudochol deficiency Neg Hx     Social History Social History  Substance Use Topics  . Smoking status: Current Every Day Smoker    Packs/day: 1.00    Years: 42.00    Types: Cigarettes    Start date: 01/12/1971  . Smokeless tobacco: Never Used  . Alcohol use No     Comment: Report being abstinent  since ~January 2015     Allergies   Ibuprofen   Review of Systems Review of Systems  Constitutional: Positive for diaphoresis and fever ( subjective).  HENT: Negative for sore throat.   Eyes: Negative for visual disturbance.  Respiratory: Positive for shortness of breath.   Cardiovascular: Positive for chest pain and palpitations. Negative for leg swelling.  Gastrointestinal: Positive for abdominal pain, blood in stool ( x 1 episode), diarrhea, nausea and vomiting ( friday, non-bloody, resolved).  Genitourinary: Negative for dysuria and hematuria.  Musculoskeletal: Positive for neck pain ( chronic).  Skin: Negative for rash.  Neurological: Positive for light-headedness and numbness ( left arm, intermittent). Negative for dizziness and speech difficulty.      Physical Exam Updated Vital Signs BP (!) 135/111 (BP Location: Right Arm)   Pulse 102   Temp 97.9 F (36.6 C) (Oral)   Resp 20   SpO2 100%   Physical Exam  Constitutional: He appears well-developed and well-nourished. No distress.  HENT:  Head: Normocephalic and atraumatic.  Mouth/Throat: Oropharynx is clear and moist. No oropharyngeal exudate.  Eyes: Conjunctivae and EOM are normal. Pupils are equal, round, and reactive to light. Right eye exhibits no discharge. Left eye exhibits no discharge. No scleral icterus.  Neck: Normal range of motion. Neck supple.  Cardiovascular: Normal rate, regular rhythm, normal heart sounds and intact distal pulses.   No murmur heard. Pulmonary/Chest: Effort normal and breath sounds normal. No respiratory distress.  Abdominal: Soft. Bowel sounds are normal. He exhibits no distension. There is tenderness ( diffuse). There is no rigidity, no rebound, no guarding and no CVA tenderness.  Genitourinary:  Genitourinary Comments: Chaperone present for duration of exam. No external hemorrhoids present. No evidence of fissure. Rectal tone normal. Stool brown.   Musculoskeletal: Normal range of motion. He exhibits no edema or tenderness.  Lymphadenopathy:    He has no cervical adenopathy.  Neurological: He is alert. He has normal strength. He is not disoriented. GCS eye subscore is 4. GCS verbal subscore is 5. GCS motor subscore is 6.  Alert, thought content appropriate, able to give a coherent history. Speech fluent without evidence of aphasia. Able to follow 2 step commands without difficulty.  CN III-XII grossly intact.  Reported decrease sensation to light touch in right UE and LE.   Skin: Skin is warm and dry. He is not diaphoretic.  Psychiatric: He has a normal mood and affect. His behavior is normal.     ED Treatments / Results  Labs (all labs ordered are listed, but only abnormal results are displayed) Labs Reviewed  BASIC METABOLIC PANEL -  Abnormal; Notable for the following:       Result Value   Creatinine, Ser 1.39 (*)    Calcium 8.4 (*)    GFR calc non Af Amer 56 (*)    All other components within normal limits  URINE RAPID DRUG SCREEN, HOSP PERFORMED - Abnormal; Notable for the following:    Cocaine POSITIVE (*)    Tetrahydrocannabinol POSITIVE (*)    All other components within normal limits  CBC  PROTIME-INR  I-STAT TROPOININ, ED    EKG  EKG Interpretation  Date/Time:  Sunday March 09 2016 11:03:25 EDT Ventricular Rate:  53 PR Interval:  164 QRS Duration: 84 QT Interval:  434 QTC Calculation: 407 R Axis:   90 Text Interpretation:  Sinus bradycardia Rightward axis ST elevation, consider early repolarization Borderline ECG No significant change since last tracing Confirmed by Ethelda Chick  MD, SAM (  [id number]) on 03/09/2016 11:14:20 AM       Radiology Dg Chest 2 View  Result Date: 03/09/2016 CLINICAL DATA:  Intermittent chest pain EXAM: CHEST  2 VIEW COMPARISON:  12/13/2015 FINDINGS: Normal heart size. There is no pleural effusion or edema. No airspace consolidation identified. Mild spondylosis identified within the thoracic spine. IMPRESSION: 1. No acute cardiopulmonary abnormalities. Electronically Signed   By: Signa Kellaylor  Stroud M.D.   On: 03/09/2016 12:09   Ct Angio Chest Pe W/cm &/or Wo Cm  Result Date: 03/09/2016 CLINICAL DATA:  Shortness of breath for 3 weeks. EXAM: CT ANGIOGRAPHY CHEST WITH CONTRAST TECHNIQUE: Multidetector CT imaging of the chest was performed using the standard protocol during bolus administration of intravenous contrast. Multiplanar CT image reconstructions and MIPs were obtained to evaluate the vascular anatomy. CONTRAST:  100 cc of Isovue 370 COMPARISON:  09/20/2014 FINDINGS: Mediastinum/Lymph Nodes: The heart size appears within normal limits. There is no pericardial effusion. LAD stent noted. The pulmonary artery appears patent. There is no lobar or segmental pulmonary artery filling defects  identified. Lungs/Pleura: No pleural fluid identified. There is no airspace consolidation identified. Mild changes of centrilobular emphysema. No airspace consolidation or atelectasis noted. Upper abdomen: There is reflux of contrast material from the right side of heart into the hepatic veins. Left renal cysts noted. Musculoskeletal: Spondylosis is present within the thoracic spine. No aggressive lytic or sclerotic bone lesions. Review of the MIP images confirms the above findings. IMPRESSION: 1. No evidence for acute pulmonary embolus. Electronically Signed   By: Signa Kellaylor  Stroud M.D.   On: 03/09/2016 14:06    Procedures Procedures (including critical care time)  Medications Ordered in ED Medications  aspirin chewable tablet 324 mg (324 mg Oral Given 03/09/16 1303)  morphine 4 MG/ML injection 4 mg (4 mg Intravenous Given 03/09/16 1300)  iopamidol (ISOVUE-370) 76 % injection (100 mLs  Contrast Given 03/09/16 1330)     Initial Impression / Assessment and Plan / ED Course  I have reviewed the triage vital signs and the nursing notes.  Pertinent labs & imaging results that were available during my care of the patient were reviewed by me and considered in my medical decision making (see chart for details).  Vitals:   03/09/16 1106 03/09/16 1255 03/09/16 1305  BP: (!) 135/111 144/92 132/82  Pulse: 102 (!) 46 (!) 50  Resp: 20 10 17   Temp: 97.9 F (36.6 C)    TempSrc: Oral    SpO2: 100% 99% 100%    Clinical Course   Patient presents to ED with multiple complaints, namely chest pain and SOB. Patient is afebrile and chronically ill appearing. His vital signs are remarkable for mild tachycardia and borderline tachypneic. Patient is able to speak in complete sentences. Respirations are unlabored. Will check labs, CXR, EKG, and CTA given h/o b/l PE and no coumadin for 1 month. IVF pain medication given. 324mg  ASA given. No NTG given nml blood pressure.    Patient endorses improvement in pain to 6/10,  declines further pain medication at this time. Creatinine mildly elevated, but stable compared to previous. CBC un-remarkable. UDS positive for THC and cocaine. Initial troponin negative. No acute changes in EKG compared to previous. CXR negative for acute cardiopulmonary process. PT/INR subtherapeutic. Hemoccult negative. CTA negative for acute PE. Heart score 5. Given extensive cardiac h/o, medication non-compliance, current sxs will consult hospitalist for admission for further evaluation of chest pain and SOB.    2:56 PM: spoke with Family medicine, appreciate their time  and input. Agree to admit patient for further management of chest pain and shortness of breath.   Final Clinical Impressions(s) / ED Diagnoses   Final diagnoses:  Chest pain, unspecified chest pain type  Shortness of breath  Noncompliance with medications  Cocaine abuse    New Prescriptions New Prescriptions   No medications on file     Lona Kettle, PA-C 03/10/16 0105    Doug Sou, MD 03/10/16 2316

## 2016-03-09 NOTE — H&P (Signed)
Family Medicine Teaching [facility] Admission History and Physical Service Pager: [phone]  Patient name: Timothy BosworthByron G Appling Medical record number: [id number] Date of birth: 11/29/1960 Age: 55 y.o. Gender: male  Primary Care Provider: Doris Cheadleeepak Advani, MD Consultants: none Code Status: FULL  Chief Complaint: chest pain and sob   Assessment and Plan: Timothy Peck is a 55 y.o. male presenting with chest pain and sob; he has also reports of 3 weeks history of left sided weakness and numbness. PMH is significant for NSTEMI x 2 with DES in prox LAD (2013), CVA 2015, tobacco/cocaine/THC use, HTN, HLD, Hx of bilateral PE in 03/2013.  Chest Pain: Heart score 4-5. Patient with cardiac history NSTEMI and DES on proximal LAD. Heart cath 07/2103 with patent LAD stent and no significant residual CAD. History of cocaine use with positive UDS on admission. EKG with more prominent T wave changes noted in V2-V4 compared to EKG in May 2017. Reviewed case and EKG with cardiology over the phone who believes EKG changes are early repolarization. I stat troponin 0.01 in ED. With history of bilateral PE, CTA chest obtained in ED which was negative for PE. There seems to be a MSK component to his chest pain; reproducible on exam. ASA 325 in ED.  - admit to telemetry for ACS rule out, attending Dr. Randolm IdolFletke - patient is bradycardic therefore will hold of Beta Blocker  - ASA 81 starting 8/7 - cycle troponins: <0.03 - AM EKG - lipid panel, A1c, TSH  - patient declined Nitroglycerin - consider cards consult in the AM   Bradycardia: HR 45-58. per EKG sinus bradycardia. Patient is asymptomatic. Also discussed with cards via phone.  - will monitor on telemetry  HTN: Has not been on meds for 1 month. BP mildly elevated.  - start Lisinopril 5mg  daily 8/7  History of Bilateral PE: PE in 03/2013. Per chart review, seems to be on chronic coumadin. Per chart review in 2014, he was started on Xarelto with plan to continue for  9-12 months. Unclear if patient needs to be on anticoagulation anymore. CTA chest negative for PE. Unclear why patient has needed to be on chronic anticoagulation. Since patient has not been taking Coumadin, will hold off on anticoagulation for now.  Hx of CVA, with new Left extremity symptoms x 3 weeks: with stenosis of right internal carotid artery (Carotid Doppler in 2015 with mod to severe homogeneous plaque vs thrombus noted in the proximal ICA; bilateral 1-39% stenosis). Not on medications for 1 month- ASA. Reports of left upper and lower extremity weakness and left upper extremity numbness for 3 weeks. On exam, 4/5 strength in Left upper and lower extremities, and report of decreased sensation as well.  - continue home Statin Lipitor 80mg  and ASA - CT head  - MRA/MRI brain ordered > patient refused due to claustrophobia even with medication  Alcohol Abuse/Substance abuse: reports 2 beers a day. Denies history of withdrawal. UDS positive for cocaine and THC. Cocaine last used 1 month ago. THC a few days ago.  - CIWA  Abdominal Tenderness/?Diarrhea with dark blood x 1: Abdominal tenderness diffusely but no rebound or guarding. Continued tenderness with palpation while abdominal muscles are contracted. Patient reports of hernia but none found on exam. No BM today. Patient able to eat without any issue and asking for double portions. Rectal exam and FOBT in ED negative.   - will monitor for now  FEN/GI: heart healthy  Prophylaxis: Lovenox  Disposition: admit for further evaluation  History of Present Illness:  Timothy Peck is a 55 y.o. male presenting with chest pain and sob.   Patient reports he has had left sided chest pain/pressure ("bricks on chest") and sob for the past 3 weeks but this worsened over the past 3 days. Chest pain has been constant over the past 3 days and reports of associated diaphoresis over past 2 days, sob, and nausea. He came today because his fiance forced him to  come and be evaluated. Reports symptoms worsen with coughing, movement of chest, and with ambulation/exertion. Reports sob is mainly with ambulation/exertion and not at rest. Denies recent illness. Reports of decreased PO intake. Also reports of abdominal pain from his hernia. Hernia present since Nov 2016; reports of pain with palpation; this has been stable and not worsening. Reports of watery diarrhea for 3 days. One BM per day for the past 3 days. Reports of some dark blood with wiping yesterday and little dark blood in stool yesterday. Denies history of this in the past. Has never had a colonoscopy. Also reports of intermittent left sided numbness x 3 weeks and left sided weakness. Has CVA in 2015 which he reports he rever returned to his baseline, but this weakness is new in the past 3 weeks. Denies blurred vision or slurred speech.   Reports of substance abuse. Cocaine last used 1 month ago. THC few days ago. 2 beers a day  In the ED, patient given ASA 325 and Morphine. Reports morphine helped with pain slightly.   Review Of Systems: Per HPI with the following additions:  Otherwise the remainder of the systems were negative.  Patient Active Problem List   Diagnosis Date Noted  . Chest pain with moderate risk for cardiac etiology 10/04/2014  . Malnutrition of moderate degree (HCC) 09/22/2014  . Personal history of noncompliance with medical treatment 09/21/2014  . History of arterial ischemic stroke 09/20/2014  . 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 Long term current use of anticoagulant therapy 10/11/2013  . Acute ischemic stroke (HCC) 09/21/2013  . Chronic anticoagulation, since 03/2013 for PE 07/19/2013  . Acute pericarditis, unspecified 07/19/2013    Class: Question of  . Chest pain, musculoskeletal 07/18/2013  . Pulmonary embolism and infarction, 03/13/13, on warfarin 03/14/2013  . Acute respiratory failure with hypoxia (HCC)  03/14/2013  . Blood in stool, resolved 01/11/2013  . Chest pain 12/22/2012  . CAD S/P percutaneous coronary angioplasty 02/21/12 non-STEMI -Promus DES 4.0 mm 20 mm to prox LAD 12/14/2012    Class: Diagnosis of  . Dyslipidemia, goal LDL below 70 12/14/2012  . Bradycardia 02/03/2012  . NSTEMI (non-ST elevated myocardial infarction), 07/18/13, stable cath with patent stent. 01/31/2012  . Substance abuse 01/31/2012  . Alcohol abuse 01/31/2012  . Tobacco abuse 01/31/2012  . Herniation of cervical intervertebral disc with radiculopathy 11/28/2011    Past Medical History: Past Medical History:  Diagnosis Date  . Asthma   . Bilateral pulmonary embolism [facility]) August 2014  . CAD S/P percutaneous coronary angioplasty 02/2012   NSTEMI - PCI to prox LAD  . Chronic anticoagulation December 2014   Switch from Xarelto to warfarin.  . Degenerative disc disease, cervical    Dx years ago  . Depression   . 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 Hypertension   . NSTEMI (non-ST elevated myocardial infarction) (HCC)    07/18/13, stable cath with patent stent; preserved EF by echo; follow-up Echo for CVA 09/2013:  EF 60%, mild LVH. No RDW and a. No cardiac source for embolism noted. Bubble study was not performed  . NSTEMI (non-ST elevated myocardial infarction), 07/18/13, stable cath with patent stent. 01/31/2012  . Polysubstance abuse    Past history of cocaine and marijuana, both smoking. Also long standing tobacco abuse.   . Presence of drug coated stent in LAD coronary artery 02/2012   Promus Element DES 4.0 mm x 28 mm; converted from Brilinta to aspirin during stroke evaluation in February 2015    Past Surgical History: Past Surgical History:  Procedure Laterality Date  . ANTERIOR CERVICAL DECOMP/DISCECTOMY FUSION  11/28/2011   Procedure: ANTERIOR CERVICAL DECOMPRESSION/DISCECTOMY FUSION 1 LEVEL;  Surgeon: Temple Pacini, MD;  Location: [facility] NEURO ORS;  Service: Neurosurgery;  Laterality: N/A;   Anterior Cervical Six-Seven Decompression and Fusion  . CARDIAC CATHETERIZATION  01/31/2012   Promeus Element DES 4 x 28, balloon-Emerge monorail  . CARDIAC CATHETERIZATION  12/17/12   patent stent, stable CAD  . LEFT HEART CATHETERIZATION WITH CORONARY ANGIOGRAM N/A 01/31/2012   Procedure: LEFT HEART CATHETERIZATION WITH CORONARY ANGIOGRAM;  Surgeon: Runell Gess, MD;  Location: [facility] CATH LAB;  Service: Cardiovascular;  Laterality: N/A;  . LEFT HEART CATHETERIZATION WITH CORONARY ANGIOGRAM N/A 12/17/2012   Procedure: LEFT HEART CATHETERIZATION WITH CORONARY ANGIOGRAM;  Surgeon: Marykay Lex, MD;  Location: [facility] CATH LAB;  Service: Cardiovascular;  Laterality: N/A;  . LEFT HEART CATHETERIZATION WITH CORONARY ANGIOGRAM N/A 07/18/2013   Procedure: LEFT HEART CATHETERIZATION WITH CORONARY ANGIOGRAM;  Surgeon: Marykay Lex, MD;  Location: [facility] CATH LAB;  Service: Cardiovascular;  Laterality: N/A;  . PERCUTANEOUS CORONARY STENT INTERVENTION (PCI-S) N/A 02/02/2012   Procedure: PERCUTANEOUS CORONARY STENT INTERVENTION (PCI-S);  Surgeon: Marykay Lex, MD;  Location: [facility] CATH LAB;  Service: Cardiovascular;  Laterality: N/A;  . TUMOR REMOVAL     left foot third toe    Social History: Social History  Substance Use Topics  . Smoking status: Current Every Day Smoker    Packs/day: 1.00    Years: 42.00    Types: Cigarettes    Start date: 01/12/1971  . Smokeless tobacco: Never Used  . Alcohol use No     Comment: Report being abstinent since ~January 2015   Additional social history:   Please also refer to relevant sections of EMR.  Family History: Family History  Problem Relation Age of Onset  . Coronary artery disease Father   . Stroke Father 63  . Heart disease Father   . Diabetes Father   . Aneurysm Mother 45    intracranial aneurysm rupture  . Hypertension Mother   . Hypertension Maternal Grandmother   . Diabetes Maternal Grandmother   . Hypertension Paternal Grandmother   .  Hypertension Paternal Grandfather   . Cancer Maternal Aunt   . Anesthesia problems Neg Hx   . Hypotension Neg Hx   . Malignant hyperthermia Neg Hx   . Pseudochol deficiency Neg Hx      Allergies and Medications: Allergies  Allergen Reactions  . Ibuprofen Other (See Comments)    Doctor told him he could not take   No current facility-administered medications on file prior to encounter.    Current Outpatient Prescriptions on File Prior to Encounter  Medication Sig Dispense Refill  . aspirin 81 MG chewable tablet Chew 1 tablet (81 mg total) by mouth daily. (Patient not taking: Reported on 01/02/2015) 30 tablet 0  . atorvastatin (LIPITOR) 80 MG tablet Take 1 tablet (80 mg  total) by mouth daily. (Patient not taking: Reported on 03/09/2016) 30 tablet 0  . lisinopril (PRINIVIL,ZESTRIL) 5 MG tablet Take 1 tablet (5 mg total) by mouth daily. (Patient not taking: Reported on 03/09/2016) 30 tablet 0  . mometasone-formoterol (DULERA) 100-5 MCG/ACT AERO Inhale 2 puffs into the lungs 2 (two) times daily. (Patient not taking: Reported on 09/30/2015) 1 Inhaler 5  . warfarin (COUMADIN) 7.5 MG tablet Take 1 tablet (7.5 mg total) by mouth daily. (Patient not taking: Reported on 03/09/2016) 30 tablet 0    Objective: BP (!) 133/107   Pulse 61   Temp 97.9 F (36.6 C) (Oral)   Resp 15   SpO2 98%  Exam: GEN: NAD, just finished eating a meal, pleasant and conversant, non-toxic HEENT: Atraumatic, normocephalic, neck supple, EOMI, sclera clear  CV: bradycardia, regular rhythm, no murmurs, rubs, or gallops PULM: CTAB, normal effort ABD: Soft, diffuse tender to palpation but no guarding or rebound or peritoneal signs, continues to report of tenderness with abdominal muscles contracted; no hernia noted on exam; abd nondistended, NABS, no organomegaly, + BS  SKIN: No rash or cyanosis; warm and well-perfused EXTR: No lower extremity edema or calf tenderness PSYCH: Mood and affect euthymic, normal rate and volume of  speech NEURO: Awake, alert, normal speech; 4/5 strength in left upper and lower extremities, reports decreased sensation to light touch in left upper and lower extremities. CN 3-12 wnl (visual field not tested), Cerebellar testing- finger to nose slow but able to complete.    Labs and Imaging: CBC BMET   Recent Labs Lab 03/09/16 1145  WBC 5.4  HGB 13.6  HCT 40.9  PLT 236    Recent Labs Lab 03/09/16 1145  NA 136  K 3.8  CL 105  CO2 26  BUN 13  CREATININE 1.39*  GLUCOSE 88  CALCIUM 8.4*     I stat trop: 0.01 EKG: Sinus bradycardia. More pronounced T wave changes in V2-V4 compared to prior. No ST elevation or depression.  UDS: Positive for cocaine and THC FOBT negative INR: 13 PT: 0.98  CTA Chest:  FINDINGS: Mediastinum/Lymph Nodes: The heart size appears within normal limits. There is no pericardial effusion. LAD stent noted. The pulmonary artery appears patent. There is no lobar or segmental pulmonary artery filling defects identified.  Lungs/Pleura: No pleural fluid identified. There is no airspace consolidation identified. Mild changes of centrilobular emphysema. No airspace consolidation or atelectasis noted.  Upper abdomen: There is reflux of contrast material from the right side of heart into the hepatic veins. Left renal cysts noted.  Musculoskeletal: Spondylosis is present within the thoracic spine. No aggressive lytic or sclerotic bone lesions.  Review of the MIP images confirms the above findings.  IMPRESSION: 1. No evidence for acute pulmonary embolus.  CXR:  FINDINGS: Normal heart size. There is no pleural effusion or edema. No airspace consolidation identified. Mild spondylosis identified within the thoracic spine.  IMPRESSION: 1. No acute cardiopulmonary abnormalities.  Palma Holter, MD 03/09/2016, 4:00 PM PGY-2, [facility] FPTS Intern pager: [phone], text pages welcome

## 2016-03-09 NOTE — ED Provider Notes (Signed)
Complains of anterior chest pain described as pressure with a pleuritic component worse with changing positions and worse with walking accompanied by shortness of breath onset 2-3 weeks ago. Pain is improved with rest. Associated symptoms include shortness of breath. He admits to noncompliance with all of his medications for several weeks. Admits to smoking marijuana 2 days ago and admits to using cocaine 2-3 weeks ago. Denies any IV drug use   Doug SouSam Elgie Landino, MD 03/09/16 [phone]

## 2016-03-09 NOTE — ED Notes (Signed)
Report attempted 

## 2016-03-10 ENCOUNTER — Inpatient Hospital Stay (HOSPITAL_COMMUNITY): Payer: Medicaid Other

## 2016-03-10 ENCOUNTER — Other Ambulatory Visit: Payer: Self-pay | Admitting: Physician Assistant

## 2016-03-10 DIAGNOSIS — R079 Chest pain, unspecified: Secondary | ICD-10-CM | POA: Diagnosis not present

## 2016-03-10 DIAGNOSIS — F1410 Cocaine abuse, uncomplicated: Secondary | ICD-10-CM | POA: Diagnosis not present

## 2016-03-10 DIAGNOSIS — M6289 Other specified disorders of muscle: Secondary | ICD-10-CM | POA: Diagnosis not present

## 2016-03-10 DIAGNOSIS — I209 Angina pectoris, unspecified: Secondary | ICD-10-CM

## 2016-03-10 DIAGNOSIS — R0602 Shortness of breath: Secondary | ICD-10-CM | POA: Diagnosis not present

## 2016-03-10 DIAGNOSIS — R531 Weakness: Secondary | ICD-10-CM

## 2016-03-10 DIAGNOSIS — R0789 Other chest pain: Secondary | ICD-10-CM

## 2016-03-10 DIAGNOSIS — I10 Essential (primary) hypertension: Secondary | ICD-10-CM | POA: Diagnosis not present

## 2016-03-10 LAB — LIPID PANEL
CHOLESTEROL: 112 mg/dL (ref 0–200)
HDL: 48 mg/dL (ref >40)
LDL Cholesterol: 55 mg/dL (ref 0–99)
TRIGLYCERIDES: 45 mg/dL (ref <150)
Total CHOL/HDL Ratio: 2.3 RATIO
VLDL: 9 mg/dL (ref 0–40)

## 2016-03-10 LAB — TSH: TSH: 1.859 u[IU]/mL (ref 0.350–4.500)

## 2016-03-10 LAB — TROPONIN I

## 2016-03-10 MED ORDER — ENOXAPARIN SODIUM 40 MG/0.4ML ~~LOC~~ SOLN: 40.0000 mg | SUBCUTANEOUS | Status: DC

## 2016-03-10 MED ORDER — NITROGLYCERIN 0.4 MG SL SUBL
0.4000 mg | SUBLINGUAL_TABLET | SUBLINGUAL | Status: DC | PRN
Start: 2016-03-10 — End: 2016-03-10
  Administered 2016-03-10 (×2): 0.4 mg via SUBLINGUAL

## 2016-03-10 MED ORDER — LORAZEPAM 2 MG/ML IJ SOLN
1.0000 mg | INTRAMUSCULAR | Status: DC | PRN
  Administered 2016-03-10: 1 mg via INTRAVENOUS
  Filled 2016-03-10: qty 1

## 2016-03-10 MED ORDER — LORAZEPAM 2 MG/ML IJ SOLN: 1.0000 mg | Freq: Once | INTRAMUSCULAR | Status: DC

## 2016-03-10 MED ORDER — ASPIRIN 81 MG PO CHEW: 81.0000 mg | CHEWABLE_TABLET | Freq: Every day | ORAL | 0 refills | Status: DC

## 2016-03-10 MED ORDER — WARFARIN SODIUM 7.5 MG PO TABS: 7.5000 mg | ORAL_TABLET | Freq: Every day | ORAL | 3 refills | Status: DC

## 2016-03-10 MED ORDER — LISINOPRIL 5 MG PO TABS: 5.0000 mg | ORAL_TABLET | Freq: Every day | ORAL | 0 refills | Status: DC

## 2016-03-10 MED ORDER — ATORVASTATIN CALCIUM 80 MG PO TABS: 80.0000 mg | ORAL_TABLET | Freq: Every day | ORAL | 0 refills | Status: DC

## 2016-03-10 MED ORDER — GADOBENATE DIMEGLUMINE 529 MG/ML IV SOLN
18.0000 mL | Freq: Once | INTRAVENOUS | Status: AC | PRN
  Administered 2016-03-10: 18 mL via INTRAVENOUS

## 2016-03-10 NOTE — Progress Notes (Signed)
Patient having bradycardia.  Timothy JewsKatie Thompson, PA notified.  She stated that patient has known bradycardia and bb had previously been discontinued.

## 2016-03-10 NOTE — Progress Notes (Signed)
Family Medicine Teaching Service Daily Progress Note Intern Pager: [phone]  Patient name: Timothy Peck Medical record number: [id number] Date of birth: [birth year] Age: 55 y.o. Gender: male  Primary Care Provider: Doris Cheadle, MD Consultants: none Code Status: FULL  Pt Overview and Major Events to Date:    Assessment and Plan:  Chest Pain: Heart score 4-5. Patient with cardiac history NSTEMI and drug eluting stent on proximal LAD. Heart cath 07/2103 with patent LAD stent and no significant residual CAD. History of cocaine use with positive UDS on admission. EKG with more prominent T wave changes noted in V2-V4 compared to EKG in May 2017, likely early repolarization. I stat troponin 0.01 in ED. With history of bilateral PE, CTA chest obtained in ED which was negative for PE. MSK component to his chest pain; reproducible on exam. ASA 325 in ED. - lipid panel wnl, A1c pending, TSH normal - bradycardic therefore will hold off on Beta Blocker   - patient declined Nitroglycerin  - ASA 81 starting 8/7 - cycle troponins: <0.03 negative - AM EKG shows ST segment elevations V2, V3, V4 anterior wall - Cards consult this morning, patient to go for Stress testing this afternoon  Bradycardia: HR 45-58. Per EKG sinus bradycardia. Patient is asymptomatic. Also discussed with cards via phone.  - will monitor on telemetry  HTN: Has not been on meds for 1 month. BP mildly elevated - start Lisinopril  daily 8/7 -now  ~120/80 and controlled, hold home Lisinopril for now  History of Bilateral PE: PE in 03/2013. Per chart review, seems to be on chronic coumadin. Per chart review in 2014, he was started on Xarelto with plan to continue for 9-12 months. CTA chest negative for PE. Patient on coumadin bc concern for embolic stroke. Since patient has not been taking Coumadin for about 4 weeks, have been holding off on it.  Hx of CVA, with new Left extremity symptoms x 3 weeks: with stenosis of right  internal carotid artery. Reports of left upper and lower extremity weakness and left upper extremity numbness for 3 weeks. On exam, continues to have 4/5 strength in Left upper and lower extremities, and report of decreased sensation as well.  - continue home Statin Lipitor  and ASA - CT head : Negative for hemorrhage and acute ischemia - MRA/MRI brain ordered > patient agreed to get it done today, Ativan Q4 prn for anxiety he has in regards to the procedure --Cardiology seen, stress testing today, recommended no bridge needed. Can start Coumadin later today. Consider bridging given last time patient taken coumadin was ~4 weeks ago.   Alcohol Abuse/Substance abuse: reports 2 beers a day. Denies history of withdrawal. UDS positive for cocaine and THC. Cocaine last used 1 month ago. THC a few days ago.  -counseled on tobacco cessation and avoiding cocaine use as this is contributing to c/p - on CIWA  Abdominal Tenderness/?Diarrhea with dark blood x 1: Abdominal tenderness diffusely but no rebound or guarding. Continued tenderness with palpation while abdominal muscles are contracted. Patient reports of hernia but none found on exam. No BM today. Patient able to eat without any issue and asking for double portions. Rectal exam and FOBT in ED negative.   - will monitor for now  FEN/GI: heart healthy  Prophylaxis: Lovenox  Disposition: Admitted to teaching service for evaluation.  Stress testing and MRI/MRA today.  Subjective:  Patient is complaining of generalized soreness in chest area (reproducible) and lower abdominal pain but is otherwise  comfortable. He has some anxiety in regards to the MRI/MRA procedure but is willing to try it today. He had one episode of chest pain earlier this morning and took nitroglycerin which made him feel better. Otherwise, he slept well and is eating without abdominal issues.    Objective: Temp:  [97.7 F (36.5 C)-98.1 F (36.7 C)] 98.1 F (36.7 C) (08/07  0400) Pulse Rate:  [46-61] 52 (08/07 0400) Resp:  [10-20] 20 (08/07 0400) BP: (117-148)/(65-107) 119/81 (08/07 0929) SpO2:  [98 %-100 %] 98 % (08/07 0400) Weight:  [169 lb 4.8 oz (76.8 kg)-172 lb 11.2 oz (78.3 kg)] 172 lb 11.2 oz (78.3 kg) (08/07 0400)   Physical Exam: Gen- alert and oriented in no apparent distress Skin - normal coloration and turgor, no rashes, no suspicious skin lesions noted, cap refill <2 sec Eyes - pupils equal and reactive, extraocular eye movements intact, no conjunctival injection Ears - bilateral TM's and external ear canals normal Nose - normal and patent, no erythema, discharge or rhinnorhea Mouth - mucous membranes moist, pharynx normal without lesions Neck - supple, no significant adenopathy Chest - clear to auscultation bilaterally, no wheezes, rales or rhonchi, symmetric air entry, reproducible chest pain Heart - normal rate, regular rhythm, normal S1, S2, no murmurs, rubs, clicks or gallops Abdomen - soft, diffuse abdominal pain nonfocal, nondistended, no masses or organomegaly, no peritoneal signs, no hernia palpated Musculoskeletal - Left UE/LE strength 4/5, patient reports decreased sensation  To light touch Neuro - face symmetric, patellar reflexes equal bilaterally   Laboratory:  Recent Labs Lab 03/09/16 1145  WBC 5.4  HGB 13.6  HCT 40.9  PLT 236    Recent Labs Lab 03/09/16 1145  NA 136  K 3.8  CL 105  CO2 26  BUN 13  CREATININE 1.39*  CALCIUM 8.4*  GLUCOSE 88    Troponins Neg x2  Imaging/Diagnostic Tests:  Dg Chest 2 View  Result Date: 03/09/2016 CLINICAL DATA:  Intermittent chest pain EXAM: CHEST  2 VIEW COMPARISON:  12/13/2015 FINDINGS: Normal heart size. There is no pleural effusion or edema. No airspace consolidation identified. Mild spondylosis identified within the thoracic spine. IMPRESSION: 1. No acute cardiopulmonary abnormalities. Electronically Signed   By: Signa Kell M.D.   On: 03/09/2016 12:09   Ct Head Wo  Contrast  Result Date: 03/10/2016 CLINICAL DATA:  Left-sided weakness. EXAM: CT HEAD WITHOUT CONTRAST TECHNIQUE: Contiguous axial images were obtained from the base of the skull through the vertex without intravenous contrast. COMPARISON:  Head CT and brain MRI 09/23/2013 FINDINGS: Brain: Sequela of multifocal right MCA distribution infarct with encephalomalacia in the parietal occipital lobes. No CT findings of acute ischemia. No intracranial hemorrhage, mass effect, or midline shift. No hydrocephalus. The basilar cisterns are patent. No evidence of territorial infarct. No intracranial fluid collection. Vascular: No hyperdense vessel or abnormal calcification. Skull: Negative for fracture or focal lesion. Calvarium is intact. Sinuses/Orbits: Unchanged presumed polyp in the left sphenoid sinus.Included paranasal sinuses and mastoid air cells are well aerated. Other: None. IMPRESSION: Sequela of remote right MCA distribution infarcts with encephalomalacia. No CT findings of acute ischemia. No hemorrhage. Electronically Signed   By: Rubye Oaks M.D.   On: 03/10/2016 00:44   Ct Angio Chest Pe W/cm &/or Wo Cm  Result Date: 03/09/2016 CLINICAL DATA:  Shortness of breath for 3 weeks. EXAM: CT ANGIOGRAPHY CHEST WITH CONTRAST TECHNIQUE: Multidetector CT imaging of the chest was performed using the standard protocol during bolus administration of intravenous contrast. Multiplanar  CT image reconstructions and MIPs were obtained to evaluate the vascular anatomy. CONTRAST:  100 cc of Isovue 370 COMPARISON:  09/20/2014 FINDINGS: Mediastinum/Lymph Nodes: The heart size appears within normal limits. There is no pericardial effusion. LAD stent noted. The pulmonary artery appears patent. There is no lobar or segmental pulmonary artery filling defects identified. Lungs/Pleura: No pleural fluid identified. There is no airspace consolidation identified. Mild changes of centrilobular emphysema. No airspace consolidation or  atelectasis noted. Upper abdomen: There is reflux of contrast material from the right side of heart into the hepatic veins. Left renal cysts noted. Musculoskeletal: Spondylosis is present within the thoracic spine. No aggressive lytic or sclerotic bone lesions. Review of the MIP images confirms the above findings. IMPRESSION: 1. No evidence for acute pulmonary embolus. Electronically Signed   By: Signa Kellaylor  Stroud M.D.   On: 03/09/2016 14:06     Freddrick MarchYashika Karron Alvizo, MD 03/10/2016, 11:17 AM PGY-1, [facility] FPTS Intern pager: [phone], text pages welcome

## 2016-03-10 NOTE — Progress Notes (Signed)
Discharge order received.  Discharge instructions reviewed with patient.  Patient given 4 BUS PASSES  (2 bus passes for he and his fiance to get home today and 2 bus passes for him to get to his stress test Monday and home from stress test Monday).

## 2016-03-10 NOTE — Progress Notes (Signed)
MD on call notified via AMION text Pt is refusing MRI at this time due to severe claustrophobia . Explained importance of test and why MD ordered. Pt states he will not be able to tolerate even if medicated. MD returned call and stated she will call Pt to discuss. Will continue to monitor. Dierdre HighmanHall, Sheryle Vice Marie, RN

## 2016-03-10 NOTE — Consult Note (Signed)
CARDIOLOGY CONSULT NOTE   Patient ID: Timothy Peck MRN: [id number] DOB/AGE: 1961-02-22 55 y.o.  Admit date: 03/09/2016  Requesting Physician: Dr. Randolm Idol Primary Physician:   Doris Cheadle, MD Primary Cardiologist:   Dr. Herbie Baltimore Reason for Consultation:  Chest pain.  HPI: Timothy Peck is a 55 y.o. male with a history of CAD s/p PCI/DES to the LAD in the setting of MI (2013), bilateral pulmonary embolisms (2014), previous CVA 2, history of polysubstance abuse (cocaine ), ongoing tobacco abuse and medical noncompliance who presented to [facility] on 03/09/16 for evaluation of chest pain.  In 12/2012 he was recathed in the setting of chest pain and not taking his dual antiplatelet therapy while incarcerated. Catheterization showed that his LAD stent was widely patent with no evidence of in-stent restenosis or thrombosis. There was no angiographic evidence of additional CAD to explain his chest pain.  He was found to have bilateral lower lobe PEs in 03/2013 and he was placed on Xarelto, which was subsequently transitioned to Coumadin.  He presented again with chest pain in 07/2013 and ruled in for NSTEMI (pk 4.34). At that time cath showed no angiographic evidence of progression of CAD with widely patent LAD stent. The most likely etiology for chest pain and elevated troponin was felt to be myopericarditis because the coronary arteries were quite large in diameter more or prior significant spasm to cause prolonged pain and MI.  In 09/2013 he had a right ICA stroke that was felt to be possible leak from a cardiac source. During that hospital stay his Brilinta was switched to aspirin.   In 09/2014 years admitted for chest pain was felt to be noncardiac in nature. His beta blocker was stopped due to bradycardia in the 40s.  He was last seen by Dr. Herbie Baltimore in 10/2014 where he complained of some chest pain. He was set up for her nuclear stress test, which was never completed.  He has been  having ongoing chest pain for about a month. He is been out of all of his medicines, including his Coumadin, due to a friend throwing them out the window. He does not have any refills. The chest pain is an anterior wall tightness. He continues to smoke and use marijuana and denies any cocaine use however UDS is positive for cocaine and THC. He has been having ongoing chest pain while admitted that is not relieved by sublingual nitroglycerin but he did request morphine and Percocets. Patient was told not to eat for a possible stress test this morning however he did eat breakfast. No lower extremity swelling, orthopnea or PND. No dizziness or syncope. No blood in his stool or urine.   Past Medical History:  Diagnosis Date  . Asthma   . Bilateral pulmonary embolism [facility]) August 2014  . CAD S/P percutaneous coronary angioplasty 02/2012   NSTEMI - PCI to prox LAD  . Chronic anticoagulation December 2014   Switch from Xarelto to warfarin.  . Degenerative disc disease, cervical    Dx years ago  . Depression   . 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 Hypertension   . NSTEMI (non-ST elevated myocardial infarction) (HCC)    07/18/13, stable cath with patent stent; preserved EF by echo; follow-up Echo for CVA 09/2013: EF 60%, mild LVH. No RDW and a. No cardiac source for embolism noted. Bubble study was not performed  . NSTEMI (non-ST elevated myocardial infarction), 07/18/13, stable cath with patent stent. 01/31/2012  .  Polysubstance abuse    Past history of cocaine and marijuana, both smoking. Also long standing tobacco abuse.   . Presence of drug coated stent in LAD coronary artery 02/2012   Promus Element DES 4.0 mm x 28 mm; converted from Brilinta to aspirin during stroke evaluation in February 2015     Past Surgical History:  Procedure Laterality Date  . ANTERIOR CERVICAL DECOMP/DISCECTOMY FUSION  11/28/2011   Procedure: ANTERIOR CERVICAL DECOMPRESSION/DISCECTOMY FUSION 1 LEVEL;  Surgeon:  Temple Pacini, MD;  Location: [facility] NEURO ORS;  Service: Neurosurgery;  Laterality: N/A;  Anterior Cervical Six-Seven Decompression and Fusion  . CARDIAC CATHETERIZATION  01/31/2012   Promeus Element DES 4 x 28, balloon-Emerge monorail  . CARDIAC CATHETERIZATION  12/17/12   patent stent, stable CAD  . LEFT HEART CATHETERIZATION WITH CORONARY ANGIOGRAM N/A 01/31/2012   Procedure: LEFT HEART CATHETERIZATION WITH CORONARY ANGIOGRAM;  Surgeon: Runell Gess, MD;  Location: [facility] CATH LAB;  Service: Cardiovascular;  Laterality: N/A;  . LEFT HEART CATHETERIZATION WITH CORONARY ANGIOGRAM N/A 12/17/2012   Procedure: LEFT HEART CATHETERIZATION WITH CORONARY ANGIOGRAM;  Surgeon: Marykay Lex, MD;  Location: [facility] CATH LAB;  Service: Cardiovascular;  Laterality: N/A;  . LEFT HEART CATHETERIZATION WITH CORONARY ANGIOGRAM N/A 07/18/2013   Procedure: LEFT HEART CATHETERIZATION WITH CORONARY ANGIOGRAM;  Surgeon: Marykay Lex, MD;  Location: [facility] CATH LAB;  Service: Cardiovascular;  Laterality: N/A;  . PERCUTANEOUS CORONARY STENT INTERVENTION (PCI-S) N/A 02/02/2012   Procedure: PERCUTANEOUS CORONARY STENT INTERVENTION (PCI-S);  Surgeon: Marykay Lex, MD;  Location: [facility] CATH LAB;  Service: Cardiovascular;  Laterality: N/A;  . TUMOR REMOVAL     left foot third toe    Allergies  Allergen Reactions  . Ibuprofen Other (See Comments)    Doctor told him he could not take    I have reviewed the patient's current medications . aspirin  81 mg Oral Daily  . atorvastatin  80 mg Oral QHS  . enoxaparin (LOVENOX) injection  40 mg Subcutaneous Q24H  . folic acid  1 mg Oral Daily  . lisinopril  5 mg Oral Daily  . multivitamin with minerals  1 tablet Oral Daily  . thiamine  100 mg Oral Daily   Or  . thiamine  100 mg Intravenous Daily     acetaminophen, gi cocktail, LORazepam **OR** LORazepam, LORazepam, nitroGLYCERIN, ondansetron (ZOFRAN) IV  Prior to Admission medications   Medication Sig Start Date End Date Taking?  Authorizing Provider  aspirin 81 MG chewable tablet Chew 1 tablet (81 mg total) by mouth daily. Patient not taking: Reported on 01/02/2015 09/21/14   Catarina Hartshorn, MD  atorvastatin (LIPITOR) 80 MG tablet Take 1 tablet (80 mg total) by mouth daily. Patient not taking: Reported on 03/09/2016 09/30/15   Bethann Berkshire, MD  lisinopril (PRINIVIL,ZESTRIL) 5 MG tablet Take 1 tablet (5 mg total) by mouth daily. Patient not taking: Reported on 03/09/2016 09/30/15   Bethann Berkshire, MD  mometasone-formoterol [facility]) 100-5 MCG/ACT AERO Inhale 2 puffs into the lungs 2 (two) times daily. Patient not taking: Reported on 09/30/2015 01/03/15   Leatha Gilding, MD  warfarin (COUMADIN) 7.5 MG tablet Take 1 tablet (7.5 mg total) by mouth daily. Patient not taking: Reported on 03/09/2016 09/30/15   Bethann Berkshire, MD     Social History   Social History  . Marital status: Divorced    Spouse name: N/A  . Number of children: N/A  . Years of education: N/A   Occupational History  . Not  on file.   Social History Main Topics  . Smoking status: Current Every Day Smoker    Packs/day: 1.00    Years: 42.00    Types: Cigarettes    Start date: 01/12/1971  . Smokeless tobacco: Never Used  . Alcohol use No     Comment: Report being abstinent since ~January 2015  . Drug use:     Types: Cocaine, Marijuana, Other-see comments     Comment: sts hasnt used cocaine in years. but used marijuana daily.   Marland Kitchen Sexual activity: Yes    Birth control/ protection: None   Other Topics Concern  . Not on file   Social History Narrative  . No narrative on file    Family Status  Relation Status  . Father Deceased  . Mother Deceased  . Maternal Grandmother Deceased  . Maternal Grandfather Deceased  . Paternal Grandmother Deceased  . Paternal Grandfather Deceased  . Maternal Aunt   . Neg Hx    Family History  Problem Relation Age of Onset  . Coronary artery disease Father   . Stroke Father 8  . Heart disease Father   . Diabetes  Father   . Aneurysm Mother 45    intracranial aneurysm rupture  . Hypertension Mother   . Hypertension Maternal Grandmother   . Diabetes Maternal Grandmother   . Hypertension Paternal Grandmother   . Hypertension Paternal Grandfather   . Cancer Maternal Aunt   . Anesthesia problems Neg Hx   . Hypotension Neg Hx   . Malignant hyperthermia Neg Hx   . Pseudochol deficiency Neg Hx        ROS:  Full 14 point review of systems complete and found to be negative unless listed above.  Physical Exam: Blood pressure 117/65, pulse (!) 52, temperature 98.1 F (36.7 C), resp. rate 20, height 6' 4.5" (1.943 m), weight 172 lb 11.2 oz (78.3 kg), SpO2 98 %.  General: Well developed, well nourished, male in no acute distress Head: Eyes PERRLA, No xanthomas.   Normocephalic and atraumatic, oropharynx without edema or exudate.   Lungs: CTAB Heart: HRRR S1 S2, no rub/gallop, Heart regular rate and rhythm with S1, S2  murmur. pulses are 2+ extrem.   Neck: No carotid bruits. No lymphadenopathy. No  JVD. Abdomen: Bowel sounds present, abdomen soft and non-tender without masses or hernias noted. Msk:  No spine or cva tenderness. No weakness, no joint deformities or effusions. Extremities: No clubbing or cyanosis. No LE edema.  Neuro: Alert and oriented X 3. No focal deficits noted. Psych:  Good affect, responds appropriately Skin: No rashes or lesions noted.  Labs:   Lab Results  Component Value Date   WBC 5.4 03/09/2016   HGB 13.6 03/09/2016   HCT 40.9 03/09/2016   MCV 92.7 03/09/2016   PLT 236 03/09/2016    Recent Labs  03/09/16 1145  INR 0.98    Recent Labs Lab 03/09/16 1145  NA 136  K 3.8  CL 105  CO2 26  BUN 13  CREATININE 1.39*  CALCIUM 8.4*  GLUCOSE 88   Magnesium  Date Value Ref Range Status  02/02/2012 1.8 1.5 - 2.5 mg/dL Final    Recent Labs  16/10/96 1706 03/09/16 1956 03/10/16 0153  TROPONINI <0.03 <0.03 <0.03    Recent Labs  03/09/16 1148  TROPIPOC 0.01     No results found for: PROBNP Lab Results  Component Value Date   CHOL 112 03/10/2016   HDL 48 03/10/2016   LDLCALC 55  03/10/2016   TRIG 45 03/10/2016   Lab Results  Component Value Date   DDIMER <0.27 09/20/2014   Lipase  Date/Time Value Ref Range Status  07/18/2013 02:45 PM 27 11 - 59 U/L Final   TSH  Date/Time Value Ref Range Status  09/22/2013 05:40 AM 0.645 0.350 - 4.500 uIU/mL Final    Comment:    Performed at [facility]   No results found for: VITAMINB12, FOLATE, FERRITIN, TIBC, IRON, RETICCTPCT  Echo: 09/22/2013 LV EF: 60% Study Conclusions - Left ventricle: Technically limited study. The cavity size was normal. Wall thickness was increased in a pattern of mild LVH. The estimated ejection fraction was 60%. Wall motion was normal; there were no regional wall motion abnormalities. - Impressions: No cardiac source of embolism was identified, but cannot be ruled out on the basis of this examination. Impressions - No cardiac source of embolism was identified, but cannot be ruled out on the basis of this examination.  ECG: Sinus bradycardia HR 45  with early repol and nonspecific ST/T-wave changes  Radiology:  Dg Chest 2 View  Result Date: 03/09/2016 CLINICAL DATA:  Intermittent chest pain EXAM: CHEST  2 VIEW COMPARISON:  12/13/2015 FINDINGS: Normal heart size. There is no pleural effusion or edema. No airspace consolidation identified. Mild spondylosis identified within the thoracic spine. IMPRESSION: 1. No acute cardiopulmonary abnormalities. Electronically Signed   By: Signa Kell M.D.   On: 03/09/2016 12:09   Ct Head Wo Contrast  Result Date: 03/10/2016 CLINICAL DATA:  Left-sided weakness. EXAM: CT HEAD WITHOUT CONTRAST TECHNIQUE: Contiguous axial images were obtained from the base of the skull through the vertex without intravenous contrast. COMPARISON:  Head CT and brain MRI 09/23/2013 FINDINGS: Brain: Sequela of multifocal right MCA  distribution infarct with encephalomalacia in the parietal occipital lobes. No CT findings of acute ischemia. No intracranial hemorrhage, mass effect, or midline shift. No hydrocephalus. The basilar cisterns are patent. No evidence of territorial infarct. No intracranial fluid collection. Vascular: No hyperdense vessel or abnormal calcification. Skull: Negative for fracture or focal lesion. Calvarium is intact. Sinuses/Orbits: Unchanged presumed polyp in the left sphenoid sinus.Included paranasal sinuses and mastoid air cells are well aerated. Other: None. IMPRESSION: Sequela of remote right MCA distribution infarcts with encephalomalacia. No CT findings of acute ischemia. No hemorrhage. Electronically Signed   By: Rubye Oaks M.D.   On: 03/10/2016 00:44   Ct Angio Chest Pe W/cm &/or Wo Cm  Result Date: 03/09/2016 CLINICAL DATA:  Shortness of breath for 3 weeks. EXAM: CT ANGIOGRAPHY CHEST WITH CONTRAST TECHNIQUE: Multidetector CT imaging of the chest was performed using the standard protocol during bolus administration of intravenous contrast. Multiplanar CT image reconstructions and MIPs were obtained to evaluate the vascular anatomy. CONTRAST:  100 cc of Isovue 370 COMPARISON:  09/20/2014 FINDINGS: Mediastinum/Lymph Nodes: The heart size appears within normal limits. There is no pericardial effusion. LAD stent noted. The pulmonary artery appears patent. There is no lobar or segmental pulmonary artery filling defects identified. Lungs/Pleura: No pleural fluid identified. There is no airspace consolidation identified. Mild changes of centrilobular emphysema. No airspace consolidation or atelectasis noted. Upper abdomen: There is reflux of contrast material from the right side of heart into the hepatic veins. Left renal cysts noted. Musculoskeletal: Spondylosis is present within the thoracic spine. No aggressive lytic or sclerotic bone lesions. Review of the MIP images confirms the above findings. IMPRESSION:  1. No evidence for acute pulmonary embolus. Electronically Signed   By: Signa Kell  M.D.   On: 03/09/2016 14:06    ASSESSMENT AND PLAN:    Active Problems:   Chest pain   Cocaine abuse   Left-sided weakness   Shortness of breath   Timothy BosworthByron G Peck is a 55 y.o. male with a history of CAD s/p PCI/DES to the LAD in the setting of MI (2013), bilateral pulmonary embolisms (2014), previous CVA 2, history of polysubstance abuse (cocaine ), ongoing tobacco abuse and medical noncompliance who [facility] for evaluation of chest pain.  Chest pain: troponin neg x3. ECG with sinus bradycardia HR 45 with early repol and nonspecific ST/T-wave changes. Initial plan was for inpatient Myoview however patient ate his breakfast despite being asked not to. Will plan for outpatient Myoview and clinic follow-up. His chest pain is atypical and not made better with nitroglycerin and patient asking for narcotics.    CAD s/p PCI/DES to LAD (2013): He has had multiple cardiac catheterizations after his stenting that have showed a patent stent and no angiographic progression of the CAD. Resume aspirin and statin. No beta blocker due to sinus bradycardia  Polysubstance abuse: patient denies using any cocaine but UDS positive for cocaine and THC.   Ongoing tobacco abuse: counseled on the importance of quitting smoking.  Sinus bradycardia: this is been documented since 09/2014. He is no longer on a beta blocker or any AV nodal blocking agents. Continue to monitor as so far this has been asymptomatic.  Medical noncompliance: patient ran out of all of his medicines and has not followed up in over a year and a half. Discussed the importance of making office clinic appointments and taking all of his medicines.  History of CVA: he will resume aspirin  History of pulmonary embolus: he will be resumed on Coumadin without a Lovenox bridge.  Signed: Cline CrockKathryn Thompson, PA-C 03/10/2016 8:51  AM  Pager [phone]  Co-Sign MD  Agree with note by Carlean JewsKatie Thompson PA-C  Pt with known CAD and H/O PE on coumadin AC but off ALL meds for past month. Admitted with atyp CP. Enz neg. UDS + cocaine. EKG w/o acute changes. CP not relieved with SL NTG, requesting narcotics. Pt ate breakfast despite being told not to for MV stress terst. Exam benign. Will D/C home today. Arrange OP MV. Refill OP meds including coumadin. Medication compliance will be an issue.  Runell GessJonathan J. Brogen Duell, M.D., FACP, [facility] LtdFACC, Earl LagosFAHA, [facility] Hospital[facility]SCAI [facility] [address] [place], [place]  [id number]  [phone] 03/10/2016 10:30 AM

## 2016-03-10 NOTE — Care Management Note (Signed)
Case Management Note  Patient Details  Name: Timothy Peck MRN: [id number] Date of Birth: 12/30/1960  Subjective/Objective:  Pt presented for chest pain. Pt has Medicaid and cost of medication will range from $1.50 to $3.00.    Action/Plan: CM did speak with pt/ MD in reagrds to medications and [facility] Pt is aware that no narcotic Rx's will be written. Pt needs to see CSW for housing resources and CM did make CSW aware. No further needs from CM at this time.   Expected Discharge Date:                  Expected Discharge Plan:  Home/Self Care  In-House Referral:  Clinical Social Work Biochemist, clinical(Housing Resources. )  Discharge planning Services  CM Consult, Medication Assistance  Post Acute Care Choice:  NA Choice offered to:  NA  DME Arranged:  N/A DME Agency:  NA  HH Arranged:  NA HH Agency:  NA  Status of Service:  Completed, signed off  If discussed at [employer] Length of Stay Meetings, dates discussed:    Additional Comments:  Gala LewandowskyGraves-Bigelow, Janise Gora Kaye, RN 03/10/2016, 11:18 AM

## 2016-03-10 NOTE — Progress Notes (Signed)
When entered room for shift report. Pt stated he was having 8/10 chest pressure after brushing his teeth. Pt requested NTG eKG preformed earlier was unremarkable, VS charted. 2SL NTG given with some relief-pt refusing 3rd SL NTG at this time.

## 2016-03-10 NOTE — Discharge Instructions (Signed)
Follow with you PCP in the next week  Follow-up Information    [facility]. Go on 03/17/2016.   Why:  @ 7:15 am for your stress test. nothing to eat or drink after midnight the night before.  Contact information: [address] [place] [phone]       Bryan Lemmaavid Harding, MD. Go on 04/21/2016.   Specialty:  Cardiology Why:  @ 8:15am. please come at 8am  Contact information: [address] [place] [address] [phone]           Heart-Healthy Eating Plan Many factors influence your heart health, including eating and exercise habits. Heart (coronary) risk increases with abnormal blood fat (lipid) levels. Heart-healthy meal planning includes limiting unhealthy fats, increasing healthy fats, and making other small dietary changes. This includes maintaining a healthy body weight to help keep lipid levels within a normal range. WHAT IS MY PLAN?  Your health care provider recommends that you:  Get no more than _________% of the total calories in your daily diet from fat.  Limit your intake of saturated fat to less than _________% of your total calories each day.  Limit the amount of cholesterol in your diet to less than _________ mg per day. WHAT TYPES OF FAT SHOULD I CHOOSE?  Choose healthy fats more often. Choose monounsaturated and polyunsaturated fats, such as olive oil and canola oil, flaxseeds, walnuts, almonds, and seeds.  Eat more omega-3 fats. Good choices include salmon, mackerel, sardines, tuna, flaxseed oil, and ground flaxseeds. Aim to eat fish at least two times each week.  Limit saturated fats. Saturated fats are primarily found in animal products, such as meats, butter, and cream. Plant sources of saturated fats include palm oil, palm kernel oil, and coconut oil.  Avoid foods with partially hydrogenated oils in them. These contain trans fats. Examples of foods that  contain trans fats are stick margarine, some tub margarines, cookies, crackers, and other baked goods. WHAT GENERAL GUIDELINES DO I NEED TO FOLLOW?  Check food labels carefully to identify foods with trans fats or high amounts of saturated fat.  Fill one half of your plate with vegetables and green salads. Eat 4-5 servings of vegetables per day. A serving of vegetables equals 1 cup of raw leafy vegetables,  cup of raw or cooked cut-up vegetables, or  cup of vegetable juice.  Fill one fourth of your plate with whole grains. Look for the word "whole" as the first word in the ingredient list.  Fill one fourth of your plate with lean protein foods.  Eat 4-5 servings of fruit per day. A serving of fruit equals one medium whole fruit,  cup of dried fruit,  cup of fresh, frozen, or canned fruit, or  cup of 100% fruit juice.  Eat more foods that contain soluble fiber. Examples of foods that contain this type of fiber are apples, broccoli, carrots, beans, peas, and barley. Aim to get 20-30 g of fiber per day.  Eat more home-cooked food and less restaurant, buffet, and fast food.  Limit or avoid alcohol.  Limit foods that are high in starch and sugar.  Avoid fried foods.  Cook foods by using methods other than frying. Baking, boiling, grilling, and broiling are all great options. Other fat-reducing suggestions include:  Removing the skin from poultry.  Removing all visible fats from meats.  Skimming the fat off of stews, soups, and gravies before serving them.  Steaming vegetables in  water or broth.  Lose weight if you are overweight. Losing just 5-10% of your initial body weight can help your overall health and prevent diseases such as diabetes and heart disease.  Increase your consumption of nuts, legumes, and seeds to 4-5 servings per week. One serving of dried beans or legumes equals  cup after being cooked, one serving of nuts equals 1 ounces, and one serving of seeds equals   ounce or 1 tablespoon.  You may need to monitor your salt (sodium) intake, especially if you have high blood pressure. Talk with your health care provider or dietitian to get more information about reducing sodium. WHAT FOODS CAN I EAT? Grains Breads, including [place], [place], pita, wheat, raisin, rye, oatmeal, and [place]. Tortillas that are neither fried nor made with lard or trans fat. Low-fat rolls, including hotdog and hamburger buns and English muffins. Biscuits. Muffins. Waffles. Pancakes. Light popcorn. Whole-grain cereals. Flatbread. Melba toast. Pretzels. Breadsticks. Rusks. Low-fat snacks and crackers, including oyster, saltine, matzo, graham, animal, and rye. Rice and pasta, including brown rice and those that are made with whole wheat. Vegetables All vegetables. Fruits All fruits, but limit coconut. Meats and Other Protein Sources Lean, well-trimmed beef, veal, pork, and lamb. Chicken and [place] without skin. All fish and shellfish. Wild duck, rabbit, pheasant, and venison. Egg whites or low-cholesterol egg substitutes. Dried beans, peas, lentils, and tofu.Seeds and most nuts. Dairy Low-fat or nonfat cheeses, including ricotta, string, and mozzarella. Skim or 1% milk that is liquid, powdered, or evaporated. Buttermilk that is made with low-fat milk. Nonfat or low-fat yogurt. Beverages Mineral water. Diet carbonated beverages. Sweets and Desserts Sherbets and fruit ices. Honey, jam, marmalade, jelly, and syrups. Meringues and gelatins. Pure sugar candy, such as hard candy, jelly beans, gumdrops, mints, marshmallows, and small amounts of dark chocolate. [employer]. Eat all sweets and desserts in moderation. Fats and Oils Nonhydrogenated (trans-free) margarines. Vegetable oils, including soybean, sesame, sunflower, olive, peanut, safflower, corn, canola, and cottonseed. Salad dressings or mayonnaise that are made with a vegetable oil. Limit added fats and oils that you use for  cooking, baking, salads, and as spreads. Other Cocoa powder. Coffee and tea. All seasonings and condiments. The items listed above may not be a complete list of recommended foods or beverages. Contact your dietitian for more options. WHAT FOODS ARE NOT RECOMMENDED? Grains Breads that are made with saturated or trans fats, oils, or whole milk. Croissants. Butter rolls. Cheese breads. Sweet rolls. Donuts. Buttered popcorn. Chow mein noodles. High-fat crackers, such as cheese or butter crackers. Meats and Other Protein Sources Fatty meats, such as hotdogs, short ribs, sausage, spareribs, bacon, ribeye roast or steak, and mutton. High-fat deli meats, such as salami and bologna. Caviar. Domestic duck and goose. Organ meats, such as kidney, liver, sweetbreads, brains, gizzard, chitterlings, and heart. Dairy Cream, sour cream, cream cheese, and creamed cottage cheese. Whole milk cheeses, including blue (bleu), [address], [place], [place], [address], [place], [address], [place], [place], and [place]. Whole or 2% milk that is liquid, evaporated, or condensed. Whole buttermilk. Cream sauce or high-fat cheese sauce. Yogurt that is made from whole milk. Beverages Regular sodas and drinks with added sugar. Sweets and Desserts Frosting. Pudding. Cookies. Cakes other than angel food cake. Candy that has milk chocolate or [place] chocolate, hydrogenated fat, butter, coconut, or unknown ingredients. Buttered syrups. Full-fat ice cream or ice cream drinks. Fats and Oils Gravy that has suet, meat fat, or shortening. Cocoa butter, hydrogenated oils, palm oil, coconut oil, palm  kernel oil. These can often be found in baked products, candy, fried foods, nondairy creamers, and whipped toppings. Solid fats and shortenings, including bacon fat, salt pork, lard, and butter. Nondairy cream substitutes, such as coffee creamers and sour cream substitutes. Salad dressings that are made of unknown oils, cheese, or sour cream. The  items listed above may not be a complete list of foods and beverages to avoid. Contact your dietitian for more information.   This information is not intended to replace advice given to you by your health care provider. Make sure you discuss any questions you have with your health care provider.   Document Released: 04/29/2008 Document Revised: 08/11/2014 Document Reviewed: 01/12/2014 Elsevier Interactive Patient Education [employer]

## 2016-03-11 LAB — HEMOGLOBIN A1C
HEMOGLOBIN A1C: 5.4 % (ref 4.8–5.6)
Mean Plasma Glucose: 108 mg/dL

## 2016-03-12 ENCOUNTER — Telehealth (HOSPITAL_COMMUNITY): Payer: Self-pay | Admitting: *Deleted

## 2016-03-12 NOTE — Discharge Summary (Signed)
Family Medicine Teaching [facility] Discharge Summary  Patient name: Timothy Peck Medical record number: [id number] Date of birth: 1961/02/21 Age: 55 y.o. Gender: male Date of Admission: 03/09/2016  Date of Discharge: 03/10/2016 Admitting Physician: Uvaldo Rising, MD  Primary Care Provider: Doris Cheadle, MD Consultants: Cardiology  Indication for Hospitalization: Chest pain  Discharge Diagnoses/Problem List:  Chest pain Cocaine abuse, substance abuse Left sided weakness NSTEMI 2014 Dyslipidemia PE 2014 Acute ischemic stroke  Disposition: Discharge home.  Discharge Condition: Stable, improved.  Discharge Exam:  Gen- alert and oriented in no apparent distress, resting comfortably in hospital bed Skin - normal coloration and turgor, no rashes, no suspicious skin lesions noted, cap refill <2 sec HEENT - PERRL, EOMI, moist mucous membranes, pharynx normal without lesions, neck supple no LAD Chest -CTA B/L, no wheezes, rales or rhonchi, symmetric air entry, reproducible chest pain on palpation Heart - normal rate, regular rhythm, normal S1, S2, no murmurs, rubs, clicks or gallops Abdomen - soft, diffuse abdominal pain nonfocal, nondistended, no masses or organomegaly, no peritoneal signs, no hernia palpated Musculoskeletal - Left UE/LE strength 4/5, patient reports decreased sensation to light touch Neuro - face symmetric, patellar reflexes equal bilaterally Psych - mood appropriate  Brief Hospital Course:   55 yo [place] American male with past medical history of CAD status post PCI, history of CVA, cocaine abuse, tobacco abuse, hypertension, hyperlipidemia, history of PE not currently on anticoagulation. Presented with substernal pressure-like chest pain over past 3 weeks with some associated shortness of breath. Also presenting with some weakness/numbness of left upper and left LE.   Chest Pain Patient complaining of chest pain, heart score 4-5. He has history of NSTEMI and  drug eluting stent on proximal LAD. Heart cath 07/2103 with patent LAD stent and no significant residual CAD. History of cocaine use with positive UDS on admission. EKG  showed more prominent T wave changes noted in V2-V4 compared to EKG in May 2017, likely early repolarization. I stat troponin 0.01 in ED. Given history of bilateral PE, CTA chest was obtained in ED which was negative for PE. There was a MSK component to his chest pain as it was reproducible on exam. Given Aspirin in ED, patient refused Nitroglycerin. Lipid panel normal, A1c of 5.4, TSH normal. Beta blocker was held due to bradycardia and troponins were negative. Cardiology was consulted and recommended going for stress testing as outpatient. At time of discharge, patient's chest pain had resolved and he was stable.   Bradycardia Patient with a heart rate of 45-58, per EKG showed sinus bradycardia. Patient was asymptomatic and this was discussed with cardiology. Monitored on tele throughout admission.  HTN Patient's BP mildly elevated and he had not been on meds for 1 month. He was given Lisinopril 5 mg. Blood pressure during hospitalization came down to ~120/80 and controlled so held home Lisinopril. BP stable upon discharge.   History of Bilateral PE Patient had PE in 03/2013. Per chart review, seems to be on chronic coumadin and was also started on Xarelto with plan to continue for 9-12 months. CTA chest negative for PE. Since patient has not been taking Coumadin for about 4 weeks, it was held during his admission. Per Cardiology reccs, continued on discharge.   Hx of CVA, with new Left extremity symptoms x 3 weeks Patient with stenosis of right internal carotid artery. He reported left upper and lower extremity weakness and left upper extremity numbness for ~3 weeks. On exam, he continued to have 4/5  strength in Left upper and lower extremities, and report of decreased sensation to light touch as well. His home Lipitor and ASA was  continued during admission. CT head was obtained, was negative for hemorrhage and acute ischemia. MRA and MRI brain showed remote right MCA territory infarct. As mentioned above, cardiology seen for this issue as well and recommended start Coumadin upon d/c.  Alcohol Abuse/Substance abuse Patient reported drinking 2 beers a day. Denies history of withdrawal. UDS positive for cocaine and THC on admission. States cocaine last used 1 month ago and THC a few days ago. Patient was counseled on tobacco cessation, substance abuse and avoiding cocaine use as this is contributing to his chest pain. On CIWA during admission.  Abdominal Tenderness/?Diarrhea with dark blood x 1 Also c/o abdominal tenderness diffusely but no rebound or guarding on physical exam. Continued to have tenderness with palpation while abdominal muscles are contracted. Patient reports of hernia but none found on exam. Patient able to tolerate op without any issues. Rectal exam and FOBT in ED negative. Abdominal pain was monitored and patient stable at time of discharge.  Issues for Follow Up: -Stress test Monday 8/7 -patient discharged on Coumadin, will need to follow INR  Significant Procedures: MRI brain, MRA head, CT chest  Significant Labs and Imaging:   Recent Labs Lab 03/09/16 1145  WBC 5.4  HGB 13.6  HCT 40.9  PLT 236    Recent Labs Lab 03/09/16 1145  NA 136  K 3.8  CL 105  CO2 26  GLUCOSE 88  BUN 13  CREATININE 1.39*  CALCIUM 8.4*    Results/Tests Pending at Time of Discharge: Stress test outpatient on Monday 8/7   Discharge Medications:    Medication List    TAKE these medications   aspirin 81 MG chewable tablet Chew 1 tablet (81 mg total) by mouth daily. What changed:  Another medication with the same name was added. Make sure you understand how and when to take each.   aspirin 81 MG chewable tablet Chew 1 tablet (81 mg total) by mouth daily. What changed:  You were already taking a  medication with the same name, and this prescription was added. Make sure you understand how and when to take each.   atorvastatin 80 MG tablet Commonly known as:  LIPITOR Take 1 tablet (80 mg total) by mouth daily. What changed:  Another medication with the same name was added. Make sure you understand how and when to take each.   atorvastatin 80 MG tablet Commonly known as:  LIPITOR Take 1 tablet (80 mg total) by mouth at bedtime. What changed:  You were already taking a medication with the same name, and this prescription was added. Make sure you understand how and when to take each.   lisinopril 5 MG tablet Commonly known as:  PRINIVIL,ZESTRIL Take 1 tablet (5 mg total) by mouth daily.   mometasone-formoterol 100-5 MCG/ACT Aero Commonly known as:  DULERA Inhale 2 puffs into the lungs 2 (two) times daily.   warfarin 7.5 MG tablet Commonly known as:  COUMADIN Take 1 tablet (7.5 mg total) by mouth daily. What changed:  Another medication with the same name was added. Make sure you understand how and when to take each.   warfarin 7.5 MG tablet Commonly known as:  COUMADIN Take 1 tablet (7.5 mg total) by mouth daily. What changed:  You were already taking a medication with the same name, and this prescription was added. Make sure you understand  how and when to take each.       Discharge Instructions: Please refer to Patient Instructions section of EMR for full details.  Patient was counseled important signs and symptoms that should prompt return to medical care, changes in medications, dietary instructions, activity restrictions, and follow up appointments.   Follow-Up Appointments: Follow-up Information     [facility] Go on 03/17/2016.   Why:  @ 7:15 am for your stress test. nothing to eat or drink after midnight the night before.  Contact information: [address] [place]  [phone]       Bryan Lemmaavid Harding, MD. Go on 04/21/2016.   Specialty:  Cardiology Why:  @ 8:15am. please come at [facility]8am  Contact information: [address] [place] [address] [phone]           Freddrick MarchYashika Renesmay Nesbitt, MD 03/12/2016, 10:29 PM PGY-1, [facility]

## 2016-03-12 NOTE — Telephone Encounter (Signed)
Attempted to call patient regarding upcoming nuclear appointment- no answer. Timothy Peck J Khali Perella, RN 

## 2016-03-17 ENCOUNTER — Encounter (HOSPITAL_COMMUNITY): Payer: Medicaid Other

## 2016-04-21 ENCOUNTER — Ambulatory Visit (INDEPENDENT_AMBULATORY_CARE_PROVIDER_SITE_OTHER): Payer: Medicaid Other | Admitting: Cardiology

## 2016-04-21 ENCOUNTER — Encounter: Payer: Self-pay | Admitting: Cardiology

## 2016-04-21 ENCOUNTER — Telehealth: Payer: Self-pay | Admitting: *Deleted

## 2016-04-21 VITALS — BP 130/88 | HR 60 | Ht 73.0 in | Wt 173.2 lb

## 2016-04-21 DIAGNOSIS — Z720 Tobacco use: Secondary | ICD-10-CM

## 2016-04-21 DIAGNOSIS — Z9861 Coronary angioplasty status: Secondary | ICD-10-CM

## 2016-04-21 DIAGNOSIS — I2699 Other pulmonary embolism without acute cor pulmonale: Secondary | ICD-10-CM

## 2016-04-21 DIAGNOSIS — F1410 Cocaine abuse, uncomplicated: Secondary | ICD-10-CM

## 2016-04-21 DIAGNOSIS — R079 Chest pain, unspecified: Secondary | ICD-10-CM

## 2016-04-21 DIAGNOSIS — Z7901 Long term (current) use of anticoagulants: Secondary | ICD-10-CM

## 2016-04-21 DIAGNOSIS — E785 Hyperlipidemia, unspecified: Secondary | ICD-10-CM

## 2016-04-21 DIAGNOSIS — F1910 Other psychoactive substance abuse, uncomplicated: Secondary | ICD-10-CM

## 2016-04-21 DIAGNOSIS — R0602 Shortness of breath: Secondary | ICD-10-CM

## 2016-04-21 DIAGNOSIS — I2510 Atherosclerotic heart disease of native coronary artery without angina pectoris: Secondary | ICD-10-CM | POA: Diagnosis not present

## 2016-04-21 DIAGNOSIS — I10 Essential (primary) hypertension: Secondary | ICD-10-CM | POA: Insufficient documentation

## 2016-04-21 DIAGNOSIS — I63231 Cerebral infarction due to unspecified occlusion or stenosis of right carotid arteries: Secondary | ICD-10-CM

## 2016-04-21 DIAGNOSIS — I214 Non-ST elevation (NSTEMI) myocardial infarction: Secondary | ICD-10-CM

## 2016-04-21 MED ORDER — ISOSORBIDE DINITRATE 20 MG PO TABS: 20.0000 mg | ORAL_TABLET | Freq: Two times a day (BID) | ORAL | 11 refills | Status: DC

## 2016-04-21 MED ORDER — ATORVASTATIN CALCIUM 80 MG PO TABS: 80.0000 mg | ORAL_TABLET | Freq: Every day | ORAL | 11 refills | Status: DC

## 2016-04-21 MED ORDER — LISINOPRIL 5 MG PO TABS: 5.0000 mg | ORAL_TABLET | Freq: Every day | ORAL | 11 refills | Status: DC

## 2016-04-21 MED ORDER — ASPIRIN EC 81 MG PO TBEC: 81.0000 mg | DELAYED_RELEASE_TABLET | Freq: Every day | ORAL | 3 refills | Status: DC

## 2016-04-21 MED ORDER — MOMETASONE FURO-FORMOTEROL FUM 100-5 MCG/ACT IN AERO: 2.0000 | INHALATION_SPRAY | Freq: Two times a day (BID) | RESPIRATORY_TRACT | 5 refills | Status: DC

## 2016-04-21 NOTE — Assessment & Plan Note (Signed)
He had evidence of stenosis before, I don't hear significant bruit. Would simply continue statin and aspirin.

## 2016-04-21 NOTE — Patient Instructions (Signed)
RESTART THE BELOW MEDICATIONS:  ASPIRIN 81 MG ONE TABLET IN THE MORNING  LISINOPRIL 5 MG ONE TABLET IN THE MORNING  ATORVASTATIN 80 MG ONE TABLET AT BEDTIME  ISOSORBIDE DINITRATE 20 MG TAKE ONE TABLET IN THE MORNING WITH ASPIRIN AND ONE TABLET AT BEDTIME.  USE DULERA AS NEEDED  [facility] Your physician has requested that you have a lexiscan myoview. For further information please visit [email or web address]. Please follow instruction sheet, as given.  Your physician recommends that you schedule a follow-up appointment YQ:MVHQIin:AFTER THE COMPLETED - 1 MONTH WITH DR HARDING.

## 2016-04-21 NOTE — Assessment & Plan Note (Signed)
Unfortunately, he has been nonadherent to warfarin. Plan: Would not restart warfarin. Would only use aspirin.

## 2016-04-21 NOTE — Assessment & Plan Note (Signed)
As per cardiology consultation in the hospital, the plan was for a Myoview stress test. In this patient who is essentially homeless, I think the best option would've been for nystatin done inpatient. However he was discharged with plans for outpatient stress test. Somehow this never did get scheduled, we will go ahead and schedule the stress test. My impression of his symptom is that is probably not cardiac in nature. He has had at least 2 cardiac catheterizations showing patent stents. Unfortunately, he is at risk for not taking medications. I would be very reluctant to consider another cardiac catheterization at this time.

## 2016-04-21 NOTE — Assessment & Plan Note (Signed)
No longer on warfarin, partly because he's not been taking it. I really think he was on it for at least 6 months. I think it was then continued ostensibly because of his stroke history. If he is not really compliant with medications, and is currently living intermittently in his car, he will not be able to do warfarin.   Plan: Would not restart warfarin. Will simply use aspirin. We provided him samples.

## 2016-04-21 NOTE — Assessment & Plan Note (Signed)
Single-vessel CAD with PCI using a DES stent that was patent into follow-up stress test.  Has not been on dual antiplatelet therapy for a long time. In fact he wasn't taking his aspirin or warfarin for several months. Would be very wary of going back to the Cath Lab unless he has obvious abnormality on stress test.  No beta blocker secondary to bradycardia in the past. We'll give him prescription for atorvastatin, ACE inhibitor as well as long-acting nitrate Isordil twice a day. Will recommend aspirin as this is less difficult for him to deal with than warfarin.

## 2016-04-21 NOTE — Telephone Encounter (Signed)
Spoke with patient regarding appt for Lexiscan myoview--scheduled for 04/30/16 @ 7:30 am---follow up with Dr. Herbie BaltimoreHarding scheduled for 05/29/16 @ 8:00 AM----Patient voiced his understanding.  I will mail all appointment  information to patient.

## 2016-04-21 NOTE — Assessment & Plan Note (Signed)
Thankfully, despite the fact that he is not on statin, his last lipids were excellent. His prescription is for 80 mg Lipitor. I think we can probably reduce to 40 mg.

## 2016-04-21 NOTE — Assessment & Plan Note (Signed)
He had an LAD lesion treated with a DES stent. He has lots of different chest pain symptoms. I don't think that these are anginal in nature, but will put him on a low-dose Isordil as there may be some microvascular disease from him using cocaine.  No wall motion on echo.  We will see if any abnormal findings show up on stress test.

## 2016-04-21 NOTE — Assessment & Plan Note (Signed)
Unfortunately, the best thing for his polysubstance abuse was being incarcerated. He continues to use cocaine, marijuana, alcohol and cigarettes. He smells like alcohol today on exam.  I counseled him for at least 5 minutes about importance of really staying away from this type of activity. With him having had PEs strokes and coronary disease, I told him basically that if he doesn't stop using cocaine, it will kill him

## 2016-04-21 NOTE — Assessment & Plan Note (Signed)
His blood pressure actually looks pretty good off medications. With him having some diastolic dysfunction from prior MI, and a history of higher pressures than currently seen, we will restart lisinopril.

## 2016-04-21 NOTE — Progress Notes (Signed)
PCP: Timothy Marek, MD  Clinic Note: Chief Complaint  Patient presents with  . Follow-up    pt has no taken any medicine since january 2017  . Coronary Artery Disease    HPI: Timothy Peck is a 55 y.o. male with a PMH below who presents today for long overdue follow-up for CAD status post PCI to the LAD the setting of an MI back in 2013.   Has h/o PSA - Marijuana & Cocaine.  Was on Percocet for chronic back pain (PCP's office was closed by FDA --> says that he uses cocaine  & MJ to Rx pain). Has "cut down smoking cigarrettes". 01/2012 - NSTEMI -- DES PCI to LAD  12/2012 - Relook Cath for SSx concerning for Unstable Angina (& med non-adherence with DAPT while incarcerated) - stent patent 03/2013-> had bilateral PE, was on Warfarin -- has not been taking for several months. 08-09-2013 - Relook cath, mild + Troponin - patent stent. ?? Myopericarditis. - 09/2013 - R ICA CVA (per his report, this was 2nd CVA).  I last saw him in March 2016  - noted CP, edema & SOB.  (BB had been d/c'd for bradycardia).  Recent Hospitalizations:  April 2017: ER visit for CP   mid Aug 2017 for chest pain -- r/o MI with plan for OP Nuc ST. CP was reproducible on exam.  BB not used 2/2 bradycardia  Unfortunately, has not been taking any meds since d/c & never had ST scheduled.  He tells me that since his father-in-law, who they were caring for completely, died in 09-Aug-2016, he and his wife have basically been homeless because the remainder of her family to call the money and belongings. He currently is on disability, therefore is unable to really work without losing his disability. She is also on disability making it very difficult. They're currently living in a motel to be off the streets. They live in a car for several months. As result, he has not been getting his medications filled.  Interval History: [name] presents today now in follow-up from a hospital stay and continues to note episodes of chest  discomfort and dyspnea. He says his chest hurts him all the time. He says he has palpitations all the time. He says he is always short of breath walking around. He is short of breath with any significant activity. He still describes some twinging sensation in his chest as well as some pressure sensations as well as some reproducible sharp pain. He is not riding his bike anymore, mostly because of his shortness of breath. He says he occasionally note palpitations, stating that he has them right now. Not related to rapid heartbeats, just irregular heartbeats.  He has not had any further TIA or amaurosis fugax symptoms or TIA/amaurosis fugax. No PND, orthopnea with maybe trace edema.   No melena, hematochezia, hematuria, or epstaxis. No claudication.  ROS: A comprehensive was performed. Review of Systems  Constitutional: Positive for malaise/fatigue.  HENT: Negative for congestion.   Respiratory: Positive for cough, shortness of breath (Mostly exertional, but with less then previously noted.) and wheezing. Negative for sputum production.   Cardiovascular: Negative for claudication.       Noted in history of present illness  Gastrointestinal: Negative for blood in stool and melena.  Genitourinary: Negative for hematuria.  Musculoskeletal:       Chest wall pain  Neurological: Positive for dizziness (positional) and focal weakness (Mild residual weakness from his stroke on the left  side). Negative for loss of consciousness.  Endo/Heme/Allergies: Does not bruise/bleed easily.  Psychiatric/Behavioral: Positive for depression. The patient is nervous/anxious.   All other systems reviewed and are negative.   Past Medical History:  Diagnosis Date  . Asthma   . Bilateral pulmonary embolism [facility]) August 2014  . CAD S/P percutaneous coronary angioplasty 02/2012   NSTEMI - PCI to prox LAD  . Chronic anticoagulation December 2014   Switch from Xarelto to warfarin.  . Degenerative disc disease,  cervical    Dx years ago  . Depression   . 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 Hypertension   . NSTEMI (non-ST elevated myocardial infarction) ([place])    07/18/13, stable cath with patent stent; preserved EF by echo; follow-up Echo for CVA 09/2013: EF 60%, mild LVH. No RDW and a. No cardiac source for embolism noted. Bubble study was not performed  . NSTEMI (non-ST elevated myocardial infarction), 07/18/13, stable cath with patent stent. 01/31/2012  . OSA (obstructive sleep apnea)    Diagnosis long ago, used to use CPAP, not currently on any treatment.  . Polysubstance abuse    Past history of cocaine and marijuana, both smoking. Also long standing tobacco abuse.   . Presence of drug coated stent in LAD coronary artery 02/2012   Promus Element DES 4.0 mm x 28 mm; converted from Brilinta to aspirin during stroke evaluation in February 2015   Past Surgical History:  Procedure Laterality Date  . ANTERIOR CERVICAL DECOMP/DISCECTOMY FUSION  11/28/2011   Procedure: ANTERIOR CERVICAL DECOMPRESSION/DISCECTOMY FUSION 1 LEVEL;  Surgeon: Timothy Pitter, MD;  Location: [facility];  Service: Neurosurgery;  Laterality: N/A;  Anterior Cervical Six-Seven Decompression and Fusion  . LEFT HEART CATHETERIZATION WITH CORONARY ANGIOGRAM N/A 01/31/2012   Procedure: LEFT HEART CATHETERIZATION WITH CORONARY ANGIOGRAM;  Surgeon: Timothy Harp, MD;  Location: [facility] CATH LAB;  Service: Cardiovascular: NSTEMI -  p-mLAD ~80% thrombotic lesion -> treated with [id number] Inhibitor & staged PCI  . LEFT HEART CATHETERIZATION WITH CORONARY ANGIOGRAM N/A 12/17/2012   Procedure: LEFT HEART CATHETERIZATION WITH CORONARY ANGIOGRAM;  Surgeon: Timothy Man, MD;  Location: [facility] CATH LAB;  Service: Cardiovascular: Patent LAD DES.  CP thought to be Non-anginal  . LEFT HEART CATHETERIZATION WITH CORONARY ANGIOGRAM N/A 07/18/2013   Procedure: LEFT HEART CATHETERIZATION WITH CORONARY ANGIOGRAM;  Surgeon: Timothy Man, MD;   Location: [facility] CATH LAB;  Service: Cardiovascular: patent stent, stable CAD.  ? Sx related to Myopericarditis.  Marland Kitchen PERCUTANEOUS CORONARY STENT INTERVENTION (PCI-S) N/A 02/02/2012   Procedure: PERCUTANEOUS CORONARY STENT INTERVENTION (PCI-S);  Surgeon: Timothy Man, MD;  Location: [facility] CATH LAB;  Service: Cardiovascular: Staged LAD PCI - Promus DES 4x28.    . TUMOR REMOVAL     left foot third toe   Previous Meds:From discharge summary. Not taking Medication Sig  . aspirin 81 MG chewable tablet Chew 1 tablet (81 mg total) by mouth daily.  Marland Kitchen atorvastatin (LIPITOR) 80 MG tablet Take 1 tablet (80 mg total) by mouth daily at 6 PM.  . lisinopril (PRINIVIL,ZESTRIL) 5 MG tablet Take 1 tablet (5 mg total) by mouth daily.  . mometasone-formoterol (DULERA) 100-5 MCG/ACT AERO Inhale 2 puffs into the lungs 2 (two) times daily. (Patient not taking: Reported on 09/20/2014)  - was on warfarin 2/2 PE history  No current outpatient prescriptions on file prior to visit.   No current facility-administered medications on file prior to visit.    Allergies  Allergen Reactions  .  Ibuprofen Other (See Comments)    Doctor told him he could not take    SOCIAL AND FAMILY HISTORY REVIEWED IN EPIC --  Social History   Social History  . Marital status: Divorced    Spouse name: N/A  . Number of children: N/A  . Years of education: N/A   Social History Main Topics  . Smoking status: Current Every Day Smoker    Packs/day: 1.00    Years: 42.00    Types: Cigarettes    Start date: 01/12/1971  . Smokeless tobacco: Never Used  . Alcohol use No     Comment: Report being abstinent since ~January 2015  . Drug use:     Types: Cocaine, Marijuana, Other-see comments     Comment: sts hasnt used cocaine in years. but used marijuana daily.   Marland Kitchen Sexual activity: Yes    Birth control/ protection: None   Other Topics Concern  . None   Social History Narrative  . None   family history includes Aneurysm (age of onset: 105)  in his mother; Cancer in his maternal aunt; Coronary artery disease in his father; Diabetes in his father and maternal grandmother; Heart disease in his father; Hypertension in his maternal grandmother, mother, paternal grandfather, and paternal grandmother; Stroke (age of onset: 71) in his father.  Father-in-law died last 07-24-2015 --> the family took everything. Was temporarily homeless.  -- cut down on cigarettes; tried to cut down cocaine --> cut down on it when doctor gave him percocet. Still uses marijuana.   Wt Readings from Last 3 Encounters:  04/21/16 173 lb 3.2 oz (78.6 kg)  03/10/16 172 lb 11.2 oz (78.3 kg)  12/13/15 175 lb (79.4 kg)    PHYSICAL EXAM BP 130/88 (BP Location: Right Arm, Patient Position: Sitting, Cuff Size: Normal)   Pulse 60   Ht '6\' 1"'$  (1.854 m)   Wt 173 lb 3.2 oz (78.6 kg)   BMI 22.85 kg/m  General appearance: alert, cooperative, appears stated age, no distress; although it appears to be well-groomed and well-nourished, he is lighter than when I first met him. He smells of alcohol. Neck: no adenopathy, no carotid bruit and no JVD Lungs: Diffuse mild to moderate bibasilar rhonchorous breath sounds are clear with cough., normal percussion bilaterally and non-labored Heart: regular rate and rhythm, S1 & S2 normal, no murmur, click, rub or gallop; nondisplaced PMI Abdomen: soft, non-tender; bowel sounds normal; no masses,  no organomegaly; Extremities: extremities normal, atraumatic, no cyanosis, and minimal  edema Pulses: 2+ and symmetric;  Neurologic: Mental status: Alert, oriented, thought content appropriate Cranial nerves: normal (II-XII grossly intact)   Adult ECG Report: n/a  Recent Labs:  No lipids checked recently.  Lab Results  Component Value Date   CHOL 112 03/10/2016   HDL 48 03/10/2016   LDLCALC 55 03/10/2016   TRIG 45 03/10/2016   CHOLHDL 2.3 03/10/2016    ASSESSMENT / PLAN:. Very excitable 55 year old gentleman with mostly  single-vessel disease of the large DES stent to the LAD back in June July 2013. Found to be patent by relook catheterization in May 2014. Lots as happened since I last seen him including what seems like 2 strokes and the large PEs. He now is presenting with symptoms of chest pain again with exertion associated with dyspnea. He is a very difficult to obtain history from because he is so excitable. I do know his anatomy and therefore feeling the best course of action at this particular time is  to proceed with a noninvasive stress test. We will do a treadmill Myoview in order to see his exercise tolerance. He tells me he is not able ride his bike for more than 50 feet without getting short of breath. I would like to see if he has part of incompetence and evaluated exercise tolerance with a treadmill portion would also like the Myoview imaging.  Chest pain with moderate risk for cardiac etiology As per cardiology consultation in the hospital, the plan was for a Myoview stress test. In this patient who is essentially homeless, I think the best option would've been for nystatin done inpatient. However he was discharged with plans for outpatient stress test. Somehow this never did get scheduled, we will go ahead and schedule the stress test. My impression of his symptom is that is probably not cardiac in nature. He has had at least 2 cardiac catheterizations showing patent stents. Unfortunately, he is at risk for not taking medications. I would be very reluctant to consider another cardiac catheterization at this time.  NSTEMI (non-ST elevated myocardial infarction), 07/18/13, stable cath with patent stent. He had an LAD lesion treated with a DES stent. He has lots of different chest pain symptoms. I don't think that these are anginal in nature, but will put him on a low-dose Isordil as there may be some microvascular disease from him using cocaine.  No wall motion on echo.  We will see if any abnormal findings  show up on stress test.  CAD S/P percutaneous coronary angioplasty 02/21/12 non-STEMI -Promus DES 4.0 mm 20 mm to prox LAD Single-vessel CAD with PCI using a DES stent that was patent into follow-up stress test.  Has not been on dual antiplatelet therapy for a long time. In fact he wasn't taking his aspirin or warfarin for several months. Would be very wary of going back to the Cath Lab unless he has obvious abnormality on stress test.  No beta blocker secondary to bradycardia in the past. We'll give him prescription for atorvastatin, ACE inhibitor as well as long-acting nitrate Isordil twice a day. Will recommend aspirin as this is less difficult for him to deal with than warfarin.  Pulmonary embolism and infarction, 03/13/13, No longeron warfarin No longer on warfarin, partly because he's not been taking it. I really think he was on it for at least 6 months. I think it was then continued ostensibly because of his stroke history. If he is not really compliant with medications, and is currently living intermittently in his car, he will not be able to do warfarin.   Plan: Would not restart warfarin. Will simply use aspirin. We provided him samples.  Dyslipidemia, goal LDL below 70 Thankfully, despite the fact that he is not on statin, his last lipids were excellent. His prescription is for 80 mg Lipitor. I think we can probably reduce to 40 mg.  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 Would simply continue statin and aspirin.  Chronic anticoagulation, since 03/2013 for PE Unfortunately, he has been nonadherent to warfarin. Plan: Would not restart warfarin. Would only use aspirin.  Substance abuse Unfortunately, the best thing for his polysubstance abuse was being incarcerated. He continues to use cocaine, marijuana, alcohol and cigarettes. He smells like alcohol today on exam.  I counseled him for  at least 5 minutes about importance of really staying away from this type of activity. With him having had PEs strokes  and coronary disease, I told him basically that if he doesn't stop using cocaine, it will kill him  Essential hypertension His blood pressure actually looks pretty good off medications. With him having some diastolic dysfunction from prior MI, and a history of higher pressures than currently seen, we will restart lisinopril.  Shortness of breath We'll see what his EF looks like on nuclear stress test. I really suspect that this is probably not necessarily cardiac in nature. It's probably more related to a pulmonary issue either from COPD or even potential chronic damage from smoking cocaine use.  We'll provide prescription for his inhaler.   Orders Placed This Encounter  Procedures  . Myocardial Perfusion Imaging    Standing Status:   Future    Standing Expiration Date:   04/21/2017    Order Specific Question:   Where should this test be performed    Answer:   [facility]    Order Specific Question:   Type of stress    Answer:   Lexiscan    Order Specific Question:   Patient weight in lbs    Answer:   173   Instructions: Restart medications below. Isordil is new  Meds ordered this encounter  Medications  . aspirin EC 81 MG tablet    Sig: Take 1 tablet (81 mg total) by mouth daily.    Dispense:  90 tablet    Refill:  3  . atorvastatin (LIPITOR) 80 MG tablet    Sig: Take 1 tablet (80 mg total) by mouth at bedtime.    Dispense:  30 tablet    Refill:  11  . lisinopril (PRINIVIL,ZESTRIL) 5 MG tablet    Sig: Take 1 tablet (5 mg total) by mouth daily.    Dispense:  30 tablet    Refill:  11  . mometasone-formoterol (DULERA) 100-5 MCG/ACT AERO    Sig: Inhale 2 puffs into the lungs 2 (two) times daily.    Dispense:  1 Inhaler    Refill:  5  . isosorbide dinitrate (ISORDIL) 20 MG tablet    Sig: Take 1 tablet (20 mg total) by mouth 2 (two) times daily.     Dispense:  60 tablet    Refill:  11    Followup: Roughly 1 month   Glenetta Hew, M.D., M.S. Interventional Cardiologist   Pager # [phone]

## 2016-04-21 NOTE — Assessment & Plan Note (Signed)
We'll see what his EF looks like on nuclear stress test. I really suspect that this is probably not necessarily cardiac in nature. It's probably more related to a pulmonary issue either from COPD or even potential chronic damage from smoking cocaine use.  We'll provide prescription for his inhaler.

## 2016-04-25 ENCOUNTER — Telehealth (HOSPITAL_COMMUNITY): Payer: Self-pay

## 2016-04-25 NOTE — Telephone Encounter (Signed)
Awaiting return call at this time. 

## 2016-04-30 ENCOUNTER — Inpatient Hospital Stay (HOSPITAL_COMMUNITY): Admission: RE | Admit: 2016-04-30 | Payer: Medicaid Other | Source: Ambulatory Visit

## 2016-05-16 ENCOUNTER — Telehealth (HOSPITAL_COMMUNITY): Payer: Self-pay

## 2016-05-16 NOTE — Telephone Encounter (Signed)
Pt given detailed instructions for NUC MPI on Wednesday. Pt did RBV same. 

## 2016-05-21 ENCOUNTER — Inpatient Hospital Stay (HOSPITAL_COMMUNITY): Admission: RE | Admit: 2016-05-21 | Payer: Medicaid Other | Source: Ambulatory Visit

## 2016-05-28 ENCOUNTER — Encounter: Payer: Self-pay | Admitting: Cardiology

## 2016-05-28 NOTE — Progress Notes (Deleted)
PCP: Lorayne Marek, MD  Clinic Note: No chief complaint on file.   HPI: Timothy Peck is a 55 y.o. male with a PMH below who presents today for 1 month follow-up after was supposed to be myocardial perfusion scan/Myoview. Unfortunately this was originally scheduled on September 27 and then again on October 18 he no showed for both appointments. Cardiac History:  01/2012 - NSTEMI -- DES PCI to LAD   12/2012 - Relook Cath for SSx concerning for Unstable Angina (& med non-adherence with DAPT while incarcerated) - stent patent  03/2013-> had bilateral PE, was on Warfarin -- has not been taking for several months.  07/2013 - Relook cath, mild + Troponin - patent stent. ?? Myopericarditis. -  09/2013 - R ICA CVA (per his report, this was 2nd CVA).  He has h/o PSA - Marijuana & Cocaine.  Was on Percocet for chronic back pain (PCP's office was closed by FDA --> says that he uses cocaine  & MJ to Rx pain). Has "cut down smoking cigarrettes".  Timothy Peck was last seen on September 18 -- he was seen as a follow-up from hospital stay and continued to do episodes of chest discomfort and dyspnea. He stated his chest hurts all the time. He has palpitations all the time and is always short of breath walking around. He says he short of breath with any type of activity. Chest discomfort noted is having some twinges in his chest as well as pressure. His also some reproducible sharp pain. He no longer rides his bike because of dyspnea. He has occasional palpitations, but nothing like arrhythmia.  Recent Hospitalizations: None since last visit  Studies Reviewed: Myoview not performed  Interval History: ***   No chest pain or shortness of breath with rest or exertion. No PND, orthopnea or edema. No palpitations, lightheadedness, dizziness, weakness or syncope/near syncope. No TIA/amaurosis fugax symptoms. No melena, hematochezia, hematuria, or epstaxis. No claudication.  ROS: A comprehensive was  performed. ROS   Past Medical History:  Diagnosis Date  . Asthma   . Bilateral pulmonary embolism [facility]) August 2014  . CAD S/P percutaneous coronary angioplasty 02/2012   NSTEMI - PCI to prox LAD  . Chronic anticoagulation December 2014   Switch from Xarelto to warfarin.  . Degenerative disc disease, cervical    Dx years ago  . Depression   . 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 Hypertension   . NSTEMI (non-ST elevated myocardial infarction) ([place])    07/18/13, stable cath with patent stent; preserved EF by echo; follow-up Echo for CVA 09/2013: EF 60%, mild LVH. No RDW and a. No cardiac source for embolism noted. Bubble study was not performed  . NSTEMI (non-ST elevated myocardial infarction), 07/18/13, stable cath with patent stent. 01/31/2012  . OSA (obstructive sleep apnea)    Diagnosis long ago, used to use CPAP, not currently on any treatment.  . Polysubstance abuse    Past history of cocaine and marijuana, both smoking. Also long standing tobacco abuse.   . Presence of drug coated stent in LAD coronary artery 02/2012   Promus Element DES 4.0 mm x 28 mm; converted from Brilinta to aspirin during stroke evaluation in February 2015    Past Surgical History:  Procedure Laterality Date  . ANTERIOR CERVICAL DECOMP/DISCECTOMY FUSION  11/28/2011   Procedure: ANTERIOR CERVICAL DECOMPRESSION/DISCECTOMY FUSION 1 LEVEL;  Surgeon: Charlie Pitter, MD;  Location: [place] NEURO ORS;  Service: Neurosurgery;  Laterality: N/A;  Anterior  Cervical Six-Seven Decompression and Fusion  . LEFT HEART CATHETERIZATION WITH CORONARY ANGIOGRAM N/A 01/31/2012   Procedure: LEFT HEART CATHETERIZATION WITH CORONARY ANGIOGRAM;  Surgeon: Lorretta Harp, MD;  Location: [facility] CATH LAB;  Service: Cardiovascular: NSTEMI -  p-mLAD ~80% thrombotic lesion -> treated with GPIIbIIIa Inhibitor & staged PCI  . LEFT HEART CATHETERIZATION WITH CORONARY ANGIOGRAM N/A 12/17/2012   Procedure: LEFT HEART CATHETERIZATION WITH CORONARY  ANGIOGRAM;  Surgeon: Leonie Man, MD;  Location: [facility] CATH LAB;  Service: Cardiovascular: Patent LAD DES.  CP thought to be Non-anginal  . LEFT HEART CATHETERIZATION WITH CORONARY ANGIOGRAM N/A 07/18/2013   Procedure: LEFT HEART CATHETERIZATION WITH CORONARY ANGIOGRAM;  Surgeon: Leonie Man, MD;  Location: [facility] CATH LAB;  Service: Cardiovascular: patent stent, stable CAD.  ? Sx related to Myopericarditis.  Marland Kitchen PERCUTANEOUS CORONARY STENT INTERVENTION (PCI-S) N/A 02/02/2012   Procedure: PERCUTANEOUS CORONARY STENT INTERVENTION (PCI-S);  Surgeon: Leonie Man, MD;  Location: [facility] CATH LAB;  Service: Cardiovascular: Staged LAD PCI - Promus DES 4x28.    . TUMOR REMOVAL     left foot third toe    '@hmed'$ @  Allergies  Allergen Reactions  . Ibuprofen Other (See Comments)    Doctor told him he could not take    Social History   Social History  . Marital status: Divorced    Spouse name: N/A  . Number of children: N/A  . Years of education: N/A   Social History Main Topics  . Smoking status: Current Every Day Smoker    Packs/day: 1.00    Years: 42.00    Types: Cigarettes    Start date: 01/12/1971  . Smokeless tobacco: Never Used  . Alcohol use No     Comment: Report being abstinent since ~January 2015  . Drug use:     Types: Cocaine, Marijuana, Other-see comments     Comment: sts hasnt used cocaine in years. but used marijuana daily.   Marland Kitchen Sexual activity: Yes    Birth control/ protection: None   Other Topics Concern  . None   Social History Narrative   He tells me that since his father-in-law, who they were caring for completely, died in 18-Jul-2016, he and his wife have basically been homeless because the remainder of her family to call the money and belongings. He currently is on disability, therefore is unable to really work without losing his disability. She is also on disability making it very difficult. They're currently living in a motel to be off the streets. They live in a  car for several months. As result, he has not been getting his medications filled.    Family History  Problem Relation Age of Onset  . Coronary artery disease Father   . Stroke Father 41  . Heart disease Father   . Diabetes Father   . Aneurysm Mother 44    intracranial aneurysm rupture  . Hypertension Mother   . Hypertension Maternal Grandmother   . Diabetes Maternal Grandmother   . Hypertension Paternal Grandmother   . Hypertension Paternal Grandfather   . Cancer Maternal Aunt   . Anesthesia problems Neg Hx   . Hypotension Neg Hx   . Malignant hyperthermia Neg Hx   . Pseudochol deficiency Neg Hx     Wt Readings from Last 3 Encounters:  04/21/16 78.6 kg (173 lb 3.2 oz)  03/10/16 78.3 kg (172 lb 11.2 oz)  12/13/15 79.4 kg (175 lb)    PHYSICAL EXAM There were no vitals taken  for this visit. General appearance: alert, cooperative, appears stated age, no distress; although it appears to be well-groomed and well-nourished, he is lighter than when I first met him. He smells of alcohol. Neck: no adenopathy, no carotid bruit and no JVD Lungs: Diffuse mild to moderate bibasilar rhonchorous breath sounds are clear with cough., normal percussion bilaterally and non-labored Heart: regular rate and rhythm, S1 & S2 normal, no murmur, click, rub or gallop; nondisplaced PMI Abdomen: soft, non-tender; bowel sounds normal; no masses,  no organomegaly; Extremities: extremities normal, atraumatic, no cyanosis, and minimal  edema Pulses: 2+ and symmetric;  Neurologic: Mental status: Alert, oriented, thought content appropriate Cranial nerves: normal (II-XII grossly intact)   Adult ECG Report  Rate: *** ;  Rhythm: {rhythm:17366};   Narrative Interpretation: ***   Other studies Reviewed: Additional studies/ records that were reviewed today include:  Recent Labs:  ***     ASSESSMENT / PLAN: Problem List Items Addressed This Visit    None    Visit Diagnoses   None.     Current  medicines are reviewed at length with the patient today. (+/- concerns) *** The following changes have been made: ***  There are no Patient Instructions on file for this visit.  Studies Ordered:   No orders of the defined types were placed in this encounter.     Glenetta Hew, M.D., M.S. Interventional Cardiologist   Pager # [phone] Phone # [phone] [place], [place] [id number]

## 2016-05-29 ENCOUNTER — Ambulatory Visit: Payer: Medicaid Other | Admitting: Cardiology

## 2016-05-30 ENCOUNTER — Encounter: Payer: Self-pay | Admitting: *Deleted

## 2016-06-19 ENCOUNTER — Emergency Department (HOSPITAL_COMMUNITY)
Admission: EM | Admit: 2016-06-19 | Discharge: 2016-06-19 | Disposition: A | Discharge status: Home / Self Care | Payer: Medicaid Other | Attending: Emergency Medicine | Admitting: Emergency Medicine

## 2016-06-19 ENCOUNTER — Encounter (HOSPITAL_COMMUNITY): Payer: Self-pay | Admitting: Emergency Medicine

## 2016-06-19 DIAGNOSIS — I2510 Atherosclerotic heart disease of native coronary artery without angina pectoris: Secondary | ICD-10-CM | POA: Diagnosis not present

## 2016-06-19 DIAGNOSIS — M79601 Pain in right arm: Secondary | ICD-10-CM | POA: Insufficient documentation

## 2016-06-19 DIAGNOSIS — F17210 Nicotine dependence, cigarettes, uncomplicated: Secondary | ICD-10-CM | POA: Insufficient documentation

## 2016-06-19 DIAGNOSIS — Z955 Presence of coronary angioplasty implant and graft: Secondary | ICD-10-CM | POA: Diagnosis not present

## 2016-06-19 DIAGNOSIS — I10 Essential (primary) hypertension: Secondary | ICD-10-CM | POA: Insufficient documentation

## 2016-06-19 DIAGNOSIS — J45909 Unspecified asthma, uncomplicated: Secondary | ICD-10-CM | POA: Diagnosis not present

## 2016-06-19 DIAGNOSIS — I252 Old myocardial infarction: Secondary | ICD-10-CM | POA: Diagnosis not present

## 2016-06-19 DIAGNOSIS — Z7982 Long term (current) use of aspirin: Secondary | ICD-10-CM | POA: Insufficient documentation

## 2016-06-19 DIAGNOSIS — Z5321 Procedure and treatment not carried out due to patient leaving prior to being seen by health care provider: Secondary | ICD-10-CM | POA: Insufficient documentation

## 2016-06-19 LAB — COMPREHENSIVE METABOLIC PANEL
ALT: 27 U/L (ref 17–63)
ANION GAP: 6 (ref 5–15)
AST: 24 U/L (ref 15–41)
Albumin: 4.1 g/dL (ref 3.5–5.0)
Alkaline Phosphatase: 57 U/L (ref 38–126)
BUN: 16 mg/dL (ref 6–20)
CHLORIDE: 108 mmol/L (ref 101–111)
CO2: 27 mmol/L (ref 22–32)
Calcium: 9.3 mg/dL (ref 8.9–10.3)
Creatinine, Ser: 1.33 mg/dL — ABNORMAL HIGH (ref 0.61–1.24)
GFR calc non Af Amer: 60 mL/min — ABNORMAL LOW (ref >60)
Glucose, Bld: 111 mg/dL — ABNORMAL HIGH (ref 65–99)
Potassium: 3.9 mmol/L (ref 3.5–5.1)
SODIUM: 141 mmol/L (ref 135–145)
Total Bilirubin: 0.8 mg/dL (ref 0.3–1.2)
Total Protein: 6.6 g/dL (ref 6.5–8.1)

## 2016-06-19 LAB — CBC
HCT: 39.9 % (ref 39.0–52.0)
HEMOGLOBIN: 13.8 g/dL (ref 13.0–17.0)
MCH: 31.5 pg (ref 26.0–34.0)
MCHC: 34.6 g/dL (ref 30.0–36.0)
MCV: 91.1 fL (ref 78.0–100.0)
Platelets: 214 10*3/uL (ref 150–400)
RBC: 4.38 MIL/uL (ref 4.22–5.81)
RDW: 13.7 % (ref 11.5–15.5)
WBC: 7.7 10*3/uL (ref 4.0–10.5)

## 2016-06-19 LAB — PROTIME-INR
INR: 1.03
Prothrombin Time: 13.5 seconds (ref 11.4–15.2)

## 2016-06-19 NOTE — ED Notes (Signed)
Pt called again for reassessment, no answer. Pt had informed previous nurse first he was stepping out and he'd be back later.

## 2016-06-19 NOTE — ED Notes (Signed)
Attempted to call for reassessment, no answer

## 2016-06-19 NOTE — ED Triage Notes (Signed)
Pt sts right arm and side pain x several days; pt sts hx of blood clots and not taking coumadin x 1 month

## 2016-06-19 NOTE — ED Notes (Signed)
Called for pt to update vitals, no answer.  

## 2016-07-02 ENCOUNTER — Telehealth (HOSPITAL_COMMUNITY): Payer: Self-pay

## 2016-07-02 ENCOUNTER — Emergency Department (HOSPITAL_COMMUNITY): Payer: Medicaid Other

## 2016-07-02 ENCOUNTER — Observation Stay (HOSPITAL_COMMUNITY): Payer: Medicaid Other

## 2016-07-02 ENCOUNTER — Encounter (HOSPITAL_COMMUNITY): Payer: Self-pay | Admitting: *Deleted

## 2016-07-02 ENCOUNTER — Observation Stay (HOSPITAL_COMMUNITY)
Admission: EM | Admit: 2016-07-02 | Discharge: 2016-07-04 | Discharge status: Against Medical Advice | Payer: Medicaid Other | Attending: Internal Medicine | Admitting: Internal Medicine

## 2016-07-02 DIAGNOSIS — G4733 Obstructive sleep apnea (adult) (pediatric): Secondary | ICD-10-CM | POA: Diagnosis not present

## 2016-07-02 DIAGNOSIS — Z5321 Procedure and treatment not carried out due to patient leaving prior to being seen by health care provider: Secondary | ICD-10-CM | POA: Insufficient documentation

## 2016-07-02 DIAGNOSIS — R042 Hemoptysis: Secondary | ICD-10-CM | POA: Diagnosis not present

## 2016-07-02 DIAGNOSIS — R0602 Shortness of breath: Secondary | ICD-10-CM | POA: Insufficient documentation

## 2016-07-02 DIAGNOSIS — I2510 Atherosclerotic heart disease of native coronary artery without angina pectoris: Secondary | ICD-10-CM | POA: Insufficient documentation

## 2016-07-02 DIAGNOSIS — R079 Chest pain, unspecified: Secondary | ICD-10-CM | POA: Diagnosis present

## 2016-07-02 DIAGNOSIS — J45909 Unspecified asthma, uncomplicated: Secondary | ICD-10-CM | POA: Insufficient documentation

## 2016-07-02 DIAGNOSIS — Z9114 Patient's other noncompliance with medication regimen: Secondary | ICD-10-CM | POA: Insufficient documentation

## 2016-07-02 DIAGNOSIS — R112 Nausea with vomiting, unspecified: Secondary | ICD-10-CM | POA: Diagnosis not present

## 2016-07-02 DIAGNOSIS — I10 Essential (primary) hypertension: Secondary | ICD-10-CM | POA: Diagnosis not present

## 2016-07-02 DIAGNOSIS — F329 Major depressive disorder, single episode, unspecified: Secondary | ICD-10-CM | POA: Insufficient documentation

## 2016-07-02 DIAGNOSIS — I252 Old myocardial infarction: Secondary | ICD-10-CM | POA: Diagnosis not present

## 2016-07-02 DIAGNOSIS — Z7901 Long term (current) use of anticoagulants: Secondary | ICD-10-CM | POA: Diagnosis not present

## 2016-07-02 DIAGNOSIS — Z955 Presence of coronary angioplasty implant and graft: Secondary | ICD-10-CM | POA: Insufficient documentation

## 2016-07-02 DIAGNOSIS — I071 Rheumatic tricuspid insufficiency: Secondary | ICD-10-CM | POA: Diagnosis not present

## 2016-07-02 DIAGNOSIS — Z86711 Personal history of pulmonary embolism: Secondary | ICD-10-CM | POA: Insufficient documentation

## 2016-07-02 DIAGNOSIS — F1410 Cocaine abuse, uncomplicated: Secondary | ICD-10-CM | POA: Diagnosis not present

## 2016-07-02 DIAGNOSIS — I2589 Other forms of chronic ischemic heart disease: Secondary | ICD-10-CM | POA: Diagnosis not present

## 2016-07-02 DIAGNOSIS — R0789 Other chest pain: Secondary | ICD-10-CM | POA: Diagnosis not present

## 2016-07-02 DIAGNOSIS — E785 Hyperlipidemia, unspecified: Secondary | ICD-10-CM | POA: Insufficient documentation

## 2016-07-02 DIAGNOSIS — R51 Headache: Secondary | ICD-10-CM | POA: Diagnosis not present

## 2016-07-02 DIAGNOSIS — N281 Cyst of kidney, acquired: Secondary | ICD-10-CM | POA: Insufficient documentation

## 2016-07-02 DIAGNOSIS — F17210 Nicotine dependence, cigarettes, uncomplicated: Secondary | ICD-10-CM | POA: Diagnosis not present

## 2016-07-02 DIAGNOSIS — R197 Diarrhea, unspecified: Secondary | ICD-10-CM | POA: Insufficient documentation

## 2016-07-02 DIAGNOSIS — M5030 Other cervical disc degeneration, unspecified cervical region: Secondary | ICD-10-CM | POA: Insufficient documentation

## 2016-07-02 DIAGNOSIS — Z7982 Long term (current) use of aspirin: Secondary | ICD-10-CM | POA: Insufficient documentation

## 2016-07-02 DIAGNOSIS — Z9119 Patient's noncompliance with other medical treatment and regimen: Secondary | ICD-10-CM | POA: Insufficient documentation

## 2016-07-02 DIAGNOSIS — R791 Abnormal coagulation profile: Secondary | ICD-10-CM

## 2016-07-02 DIAGNOSIS — Z8673 Personal history of transient ischemic attack (TIA), and cerebral infarction without residual deficits: Secondary | ICD-10-CM | POA: Diagnosis not present

## 2016-07-02 DIAGNOSIS — K7689 Other specified diseases of liver: Secondary | ICD-10-CM | POA: Insufficient documentation

## 2016-07-02 LAB — BASIC METABOLIC PANEL
ANION GAP: 9 (ref 5–15)
BUN: 19 mg/dL (ref 6–20)
CALCIUM: 9.2 mg/dL (ref 8.9–10.3)
CO2: 24 mmol/L (ref 22–32)
Chloride: 107 mmol/L (ref 101–111)
Creatinine, Ser: 1.28 mg/dL — ABNORMAL HIGH (ref 0.61–1.24)
GFR calc Af Amer: >60 mL/min (ref >60)
GLUCOSE: 105 mg/dL — AB (ref 65–99)
POTASSIUM: 3.9 mmol/L (ref 3.5–5.1)
Sodium: 140 mmol/L (ref 135–145)

## 2016-07-02 LAB — URINALYSIS, ROUTINE W REFLEX MICROSCOPIC
Bilirubin Urine: NEGATIVE
GLUCOSE, UA: NEGATIVE mg/dL
Hgb urine dipstick: NEGATIVE
Ketones, ur: NEGATIVE mg/dL
LEUKOCYTES UA: NEGATIVE
Nitrite: NEGATIVE
PROTEIN: NEGATIVE mg/dL
SPECIFIC GRAVITY, URINE: 1.023 (ref 1.005–1.030)
pH: 7 (ref 5.0–8.0)

## 2016-07-02 LAB — CBC
HEMATOCRIT: 40.2 % (ref 39.0–52.0)
HEMOGLOBIN: 14.4 g/dL (ref 13.0–17.0)
MCH: 32.3 pg (ref 26.0–34.0)
MCHC: 35.8 g/dL (ref 30.0–36.0)
MCV: 90.1 fL (ref 78.0–100.0)
Platelets: 247 10*3/uL (ref 150–400)
RBC: 4.46 MIL/uL (ref 4.22–5.81)
RDW: 13 % (ref 11.5–15.5)
WBC: 9.2 10*3/uL (ref 4.0–10.5)

## 2016-07-02 LAB — I-STAT TROPONIN, ED
TROPONIN I, POC: 0 ng/mL (ref 0.00–0.08)
Troponin i, poc: 0 ng/mL (ref 0.00–0.08)

## 2016-07-02 LAB — PROTIME-INR
INR: 1.03
Prothrombin Time: 13.5 seconds (ref 11.4–15.2)

## 2016-07-02 LAB — TROPONIN I
Troponin I: <0.03 ng/mL (ref <0.03)
Troponin I: <0.03 ng/mL (ref <0.03)

## 2016-07-02 MED ORDER — SODIUM CHLORIDE 0.9% FLUSH
3.0000 mL | Freq: Two times a day (BID) | INTRAVENOUS | Status: DC
  Administered 2016-07-02 – 2016-07-04 (×4): 3 mL via INTRAVENOUS

## 2016-07-02 MED ORDER — MOMETASONE FURO-FORMOTEROL FUM 100-5 MCG/ACT IN AERO
2.0000 | INHALATION_SPRAY | Freq: Two times a day (BID) | RESPIRATORY_TRACT | Status: DC
  Administered 2016-07-03 (×2): 2 via RESPIRATORY_TRACT
  Filled 2016-07-02: qty 8.8

## 2016-07-02 MED ORDER — IOPAMIDOL (ISOVUE-370) INJECTION 76%
INTRAVENOUS | Status: AC
  Administered 2016-07-02: 100 mL
  Filled 2016-07-02: qty 100

## 2016-07-02 MED ORDER — ACETAMINOPHEN 325 MG PO TABS
650.0000 mg | ORAL_TABLET | Freq: Four times a day (QID) | ORAL | Status: DC | PRN
  Administered 2016-07-02 – 2016-07-03 (×2): 650 mg via ORAL
  Filled 2016-07-02 (×2): qty 2

## 2016-07-02 MED ORDER — IPRATROPIUM-ALBUTEROL 0.5-2.5 (3) MG/3ML IN SOLN: 3.0000 mL | Freq: Four times a day (QID) | RESPIRATORY_TRACT | Status: DC | PRN

## 2016-07-02 MED ORDER — ASPIRIN EC 325 MG PO TBEC
325.0000 mg | DELAYED_RELEASE_TABLET | Freq: Every day | ORAL | Status: DC
  Administered 2016-07-03 – 2016-07-04 (×2): 325 mg via ORAL
  Filled 2016-07-02 (×2): qty 1

## 2016-07-02 MED ORDER — ISOSORBIDE DINITRATE 20 MG PO TABS
20.0000 mg | ORAL_TABLET | Freq: Two times a day (BID) | ORAL | Status: DC
  Administered 2016-07-02 – 2016-07-03 (×2): 20 mg via ORAL
  Filled 2016-07-02 (×7): qty 1

## 2016-07-02 MED ORDER — VITAMIN B-1 100 MG PO TABS
100.0000 mg | ORAL_TABLET | Freq: Every day | ORAL | Status: DC
  Administered 2016-07-03 – 2016-07-04 (×2): 100 mg via ORAL
  Filled 2016-07-02 (×2): qty 1

## 2016-07-02 MED ORDER — NITROGLYCERIN 0.4 MG SL SUBL: 0.4000 mg | SUBLINGUAL_TABLET | SUBLINGUAL | Status: DC | PRN

## 2016-07-02 MED ORDER — ENOXAPARIN SODIUM 40 MG/0.4ML ~~LOC~~ SOLN
40.0000 mg | SUBCUTANEOUS | Status: DC
  Administered 2016-07-02 – 2016-07-03 (×2): 40 mg via SUBCUTANEOUS
  Filled 2016-07-02 (×2): qty 0.4

## 2016-07-02 MED ORDER — ASPIRIN 81 MG PO CHEW
324.0000 mg | CHEWABLE_TABLET | Freq: Once | ORAL | Status: AC
  Administered 2016-07-02: 324 mg via ORAL
  Filled 2016-07-02: qty 4

## 2016-07-02 MED ORDER — ENSURE ENLIVE PO LIQD: 237.0000 mL | Freq: Two times a day (BID) | ORAL | Status: DC

## 2016-07-02 MED ORDER — ACETAMINOPHEN 650 MG RE SUPP: 650.0000 mg | Freq: Four times a day (QID) | RECTAL | Status: DC | PRN

## 2016-07-02 MED ORDER — ADULT MULTIVITAMIN W/MINERALS CH
1.0000 | ORAL_TABLET | Freq: Every day | ORAL | Status: DC
  Administered 2016-07-03 – 2016-07-04 (×2): 1 via ORAL
  Filled 2016-07-02 (×2): qty 1

## 2016-07-02 MED ORDER — ATORVASTATIN CALCIUM 80 MG PO TABS
80.0000 mg | ORAL_TABLET | Freq: Every day | ORAL | Status: DC
  Administered 2016-07-02 – 2016-07-03 (×2): 80 mg via ORAL
  Filled 2016-07-02 (×2): qty 1

## 2016-07-02 MED ORDER — ONDANSETRON HCL 4 MG/2ML IJ SOLN: 4.0000 mg | Freq: Four times a day (QID) | INTRAMUSCULAR | Status: DC | PRN

## 2016-07-02 MED ORDER — ONDANSETRON HCL 4 MG PO TABS: 4.0000 mg | ORAL_TABLET | Freq: Four times a day (QID) | ORAL | Status: DC | PRN

## 2016-07-02 NOTE — ED Provider Notes (Signed)
[facility]-EMERGENCY DEPT Provider Note   CSN: [id number] Arrival date & time: 07/02/16  [id number]     History   Chief Complaint Chief Complaint  Patient presents with  . Chest Pain  . Shortness of Breath    HPI Timothy Peck is a 55 y.o. male.  HPI   9/10 chest pain, short of breath, pain with breathing, 9.5-10.5 Pain "for a while" episodes on and off for a long time. 3 days ago began to have new episode. Describes as sharp left sided pain, worse with deep breaths and ambulation. Similar to prior heart pain. Can't walk far, CP worse with exertion and dyspnea, severe Has not had coumadin, inhaler, no medication for more than 1 month Sees Dr. Herbie BaltimoreHarding, has not gotten rx, appointment 12/5, 3 appt before that and missed them, says should have gone Using cocaine last night    Back pain, chronic  Past Medical History:  Diagnosis Date  . Asthma   . Bilateral pulmonary embolism [facility]) August 2014  . CAD S/P percutaneous coronary angioplasty 02/2012   NSTEMI - PCI to prox LAD  . Chronic anticoagulation December 2014   Switch from Xarelto to warfarin.  . Degenerative disc disease, cervical    Dx years ago  . Depression   . 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 Hypertension   . NSTEMI (non-ST elevated myocardial infarction) (HCC)    07/18/13, stable cath with patent stent; preserved EF by echo; follow-up Echo for CVA 09/2013: EF 60%, mild LVH. No RDW and a. No cardiac source for embolism noted. Bubble study was not performed  . NSTEMI (non-ST elevated myocardial infarction), 07/18/13, stable cath with patent stent. 01/31/2012  . OSA (obstructive sleep apnea)    Diagnosis long ago, used to use CPAP, not currently on any treatment.  . Polysubstance abuse    Past history of cocaine and marijuana, both smoking. Also long standing tobacco abuse.   . Presence of drug coated stent in LAD coronary artery 02/2012   Promus Element DES 4.0 mm x 28 mm; converted from Brilinta to aspirin  during stroke evaluation in February 2015    Patient Active Problem List   Diagnosis Date Noted  . Chest pain 07/02/2016  . Essential hypertension 04/21/2016  . Cocaine abuse   . Left-sided weakness   . Shortness of breath   . Chest pain with moderate risk for cardiac etiology 10/04/2014  . Malnutrition of moderate degree (HCC) 09/22/2014  . Noncompliance with medications 09/21/2014  . History of arterial ischemic stroke 09/20/2014  . 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 Long term current use of anticoagulant therapy 10/11/2013  . Acute ischemic stroke (HCC) 09/21/2013  . Chronic anticoagulation, since 03/2013 for PE 07/19/2013  . Chest pain, musculoskeletal 07/18/2013  . Pulmonary embolism and infarction, 03/13/13, No longeron warfarin 03/14/2013  . Acute respiratory failure with hypoxia (HCC) 03/14/2013  . Blood in stool, resolved 01/11/2013  . CAD S/P percutaneous coronary angioplasty 02/21/12 non-STEMI -Promus DES 4.0 mm 20 mm to prox LAD 12/14/2012    Class: Diagnosis of  . Dyslipidemia, goal LDL below 70 12/14/2012  . Bradycardia 02/03/2012  . NSTEMI (non-ST elevated myocardial infarction), 07/18/13, stable cath with patent stent. 01/31/2012  . Substance abuse 01/31/2012  . Alcohol abuse 01/31/2012  . Tobacco abuse 01/31/2012  . Herniation of cervical intervertebral disc with radiculopathy 11/28/2011    Past Surgical History:  Procedure Laterality Date  . ANTERIOR CERVICAL DECOMP/DISCECTOMY  FUSION  11/28/2011   Procedure: ANTERIOR CERVICAL DECOMPRESSION/DISCECTOMY FUSION 1 LEVEL;  Surgeon: Temple Pacini, MD;  Location: [facility] NEURO ORS;  Service: Neurosurgery;  Laterality: N/A;  Anterior Cervical Six-Seven Decompression and Fusion  . LEFT HEART CATHETERIZATION WITH CORONARY ANGIOGRAM N/A 01/31/2012   Procedure: LEFT HEART CATHETERIZATION WITH CORONARY ANGIOGRAM;  Surgeon: Runell Gess, MD;  Location: [facility] CATH LAB;  Service:  Cardiovascular: NSTEMI -  p-mLAD ~80% thrombotic lesion -> treated with GPIIbIIIa Inhibitor & staged PCI  . LEFT HEART CATHETERIZATION WITH CORONARY ANGIOGRAM N/A 12/17/2012   Procedure: LEFT HEART CATHETERIZATION WITH CORONARY ANGIOGRAM;  Surgeon: Marykay Lex, MD;  Location: [facility] CATH LAB;  Service: Cardiovascular: Patent LAD DES.  CP thought to be Non-anginal  . LEFT HEART CATHETERIZATION WITH CORONARY ANGIOGRAM N/A 07/18/2013   Procedure: LEFT HEART CATHETERIZATION WITH CORONARY ANGIOGRAM;  Surgeon: Marykay Lex, MD;  Location: [facility] CATH LAB;  Service: Cardiovascular: patent stent, stable CAD.  ? Sx related to Myopericarditis.  Marland Kitchen PERCUTANEOUS CORONARY STENT INTERVENTION (PCI-S) N/A 02/02/2012   Procedure: PERCUTANEOUS CORONARY STENT INTERVENTION (PCI-S);  Surgeon: Marykay Lex, MD;  Location: [facility] CATH LAB;  Service: Cardiovascular: Staged LAD PCI - Promus DES 4x28.    . TUMOR REMOVAL     left foot third toe       Home Medications    Prior to Admission medications   Medication Sig Start Date End Date Taking? Authorizing Provider  aspirin EC 81 MG tablet Take 1 tablet (81 mg total) by mouth daily. Patient not taking: Reported on 07/02/2016 04/21/16   Marykay Lex, MD  atorvastatin (LIPITOR) 80 MG tablet Take 1 tablet (80 mg total) by mouth at bedtime. Patient not taking: Reported on 07/02/2016 04/21/16   Marykay Lex, MD  isosorbide dinitrate (ISORDIL) 20 MG tablet Take 1 tablet (20 mg total) by mouth 2 (two) times daily. Patient not taking: Reported on 07/02/2016 04/21/16   Marykay Lex, MD  lisinopril (PRINIVIL,ZESTRIL) 5 MG tablet Take 1 tablet (5 mg total) by mouth daily. Patient not taking: Reported on 07/02/2016 04/21/16   Marykay Lex, MD  mometasone-formoterol [facility]) 100-5 MCG/ACT AERO Inhale 2 puffs into the lungs 2 (two) times daily. Patient not taking: Reported on 07/02/2016 04/21/16   Marykay Lex, MD    Family History Family History  Problem Relation Age of  Onset  . Coronary artery disease Father   . Stroke Father 36  . Heart disease Father   . Diabetes Father   . Aneurysm Mother 45    intracranial aneurysm rupture  . Hypertension Mother   . Hypertension Maternal Grandmother   . Diabetes Maternal Grandmother   . Hypertension Paternal Grandmother   . Hypertension Paternal Grandfather   . Cancer Maternal Aunt   . Anesthesia problems Neg Hx   . Hypotension Neg Hx   . Malignant hyperthermia Neg Hx   . Pseudochol deficiency Neg Hx     Social History Social History  Substance Use Topics  . Smoking status: Current Every Day Smoker    Packs/day: 1.00    Years: 42.00    Types: Cigarettes    Start date: 01/12/1971  . Smokeless tobacco: Never Used  . Alcohol use No     Comment: Report being abstinent since ~January 2015     Allergies   Ibuprofen   Review of Systems Review of Systems  Constitutional: Negative for fever.  HENT: Negative for sore throat.   Eyes: Negative for visual disturbance.  Respiratory: Positive for cough (2wk) and shortness of breath.   Cardiovascular: Positive for chest pain.  Gastrointestinal: Positive for diarrhea, nausea and vomiting (yesterday and day before once). Negative for abdominal pain.  Genitourinary: Negative for difficulty urinating.  Musculoskeletal: Negative for back pain and neck stiffness.  Skin: Negative for rash.  Neurological: Negative for syncope and headaches.     Physical Exam Updated Vital Signs BP 124/74   Pulse 77   Temp 98.1 F (36.7 C) (Oral)   Resp 16   SpO2 98%   Physical Exam  Constitutional: He is oriented to person, place, and time. He appears well-developed and well-nourished. No distress.  HENT:  Head: Normocephalic and atraumatic.  Eyes: Conjunctivae and EOM are normal.  Neck: Normal range of motion.  Cardiovascular: Normal rate, regular rhythm, normal heart sounds and intact distal pulses.  Exam reveals no gallop and no friction rub.   No murmur  heard. Pulmonary/Chest: Effort normal and breath sounds normal. No respiratory distress. He has no wheezes. He has no rales.  Abdominal: Soft. He exhibits no distension. There is no tenderness. There is no guarding.  Musculoskeletal: He exhibits no edema.  Neurological: He is alert and oriented to person, place, and time.  Skin: Skin is warm and dry. He is not diaphoretic.  Nursing note and vitals reviewed.    ED Treatments / Results  Labs (all labs ordered are listed, but only abnormal results are displayed) Labs Reviewed  BASIC METABOLIC PANEL - Abnormal; Notable for the following:       Result Value   Glucose, Bld 105 (*)    Creatinine, Ser 1.28 (*)    All other components within normal limits  URINALYSIS, ROUTINE W REFLEX MICROSCOPIC (NOT AT [facility]) - Abnormal; Notable for the following:    Color, Urine AMBER (*)    All other components within normal limits  CBC  PROTIME-INR  TROPONIN I  TROPONIN I  TROPONIN I  HIV ANTIBODY (ROUTINE TESTING)  OCCULT BLOOD X 1 CARD TO LAB, STOOL  BASIC METABOLIC PANEL  I-STAT TROPOININ, ED  I-STAT TROPOININ, ED    EKG  EKG Interpretation  Date/Time:  Wednesday July 02 2016 09:35:28 EST Ventricular Rate:  77 PR Interval:  142 QRS Duration: 86 QT Interval:  390 QTC Calculation: 441 R Axis:   100 Text Interpretation:  Normal sinus rhythm Right atrial enlargement Rightward axis T wave abnormality, consider inferior ischemia Abnormal ECG Since prior ECG, TW changes in inferior leadas are new Confirmed by [facility] MD, Venice Marcucci ([id number]) on 07/02/2016 10:51:34 AM       Radiology Dg Chest 2 View  Result Date: 07/02/2016 CLINICAL DATA:  Chest pain for 3 days EXAM: CHEST  2 VIEW COMPARISON:  Chest radiograph and chest CT March 09, 2016 FINDINGS: There is a probable nipple shadow on the left. There is no edema or consolidation. Heart size and pulmonary vascularity are normal. No adenopathy. There are areas of degenerative change in the  thoracic spine. A stent is noted in the left anterior descending coronary artery. There is postoperative change in the lower cervical spine region. No pneumothorax. IMPRESSION: Suspect nipple shadow on the left; repeat study with nipple markers to confirm advised. No edema or consolidation. Electronically Signed   By: Bretta Bang III M.D.   On: 07/02/2016 10:09   Ct Angio Chest Pe W And/or Wo Contrast  Result Date: 07/02/2016 CLINICAL DATA:  Shortness of breath and chest pain. Personal history pulmonary embolus. The patient has stopped  his Coumadin. EXAM: CT ANGIOGRAPHY CHEST WITH CONTRAST TECHNIQUE: Multidetector CT imaging of the chest was performed using the standard protocol during bolus administration of intravenous contrast. Multiplanar CT image reconstructions and MIPs were obtained to evaluate the vascular anatomy. CONTRAST:  100 mL Isovue 370 COMPARISON:  CT AAA of the chest 03/09/2016. CT of the abdomen and pelvis 01/02/2015. FINDINGS: Cardiovascular: Pulmonary arterial opacification is excellent. There are no focal filling defects to suggest pulmonary emboli. Heart size is normal. There is no significant pericardial effusion. Coronary artery stents are noted. Mediastinum/Nodes: No significant mediastinal or axillary adenopathy is present. Lungs/Pleura: The lungs are clear. No focal nodule, mass, or airspace disease is present. Upper Abdomen: Hepatic cysts are stable. A left renal cyst is stable is well. No acute or new lesions are present Musculoskeletal: Bone windows are unremarkable. No focal lytic or blastic lesions are present. Cervical spinal fusion is noted. Review of the MIP images confirms the above findings. IMPRESSION: 1. No pulmonary embolus. 2. No acute or focal lesion to explain the patient's chest pain or shortness of breath. 3. Stable hepatic and renal cysts. Electronically Signed   By: Marin Roberts M.D.   On: 07/02/2016 14:18    Procedures Procedures (including  critical care time)  Medications Ordered in ED Medications  nitroGLYCERIN (NITROSTAT) SL tablet 0.4 mg (not administered)  atorvastatin (LIPITOR) tablet 80 mg (not administered)  isosorbide dinitrate (ISORDIL) tablet 20 mg (not administered)  mometasone-formoterol (DULERA) 100-5 MCG/ACT inhaler 2 puff (2 puffs Inhalation Not Given 07/02/16 1519)  enoxaparin (LOVENOX) injection 40 mg (not administered)  sodium chloride flush (NS) 0.9 % injection 3 mL (not administered)  acetaminophen (TYLENOL) tablet 650 mg (not administered)    Or  acetaminophen (TYLENOL) suppository 650 mg (not administered)  ondansetron (ZOFRAN) tablet 4 mg (not administered)    Or  ondansetron (ZOFRAN) injection 4 mg (not administered)  thiamine (VITAMIN B-1) tablet 100 mg (not administered)  multivitamin with minerals tablet 1 tablet (not administered)  aspirin EC tablet 325 mg (not administered)  aspirin chewable tablet 324 mg (324 mg Oral Given 07/02/16 1315)  iopamidol (ISOVUE-370) 76 % injection (100 mLs  Contrast Given 07/02/16 1343)     Initial Impression / Assessment and Plan / ED Course  I have reviewed the triage vital signs and the nursing notes.  Pertinent labs & imaging results that were available during my care of the patient were reviewed by me and considered in my medical decision making (see chart for details).  Clinical Course    55 year old male with history of coronary artery disease status post PCI to the LAD in 2013, catheterization in 2014 with patent stents, admission in August with plan for outpatient nuclear stress test which has not yet been completed, pulmonary embolus, noncompliant with Coumadin, cocaine abuse presents with concern for chest pain and shortness of breath. EKG shows new T-wave changes in the inferior leads. Initial troponin is negative. Patient given aspirin and nitroglycerin.  Given pleuritic component, dyspnea, history of PE with noncompliance with Coumadin, CT PE study  was ordered which showed no PE. While cocaine use may be contributor to his chest pain, given patient's history of coronary artery disease, concern for anginal symptoms, feel inpatient evaluation is indicated. Pt admitted for further work up of chest pain.   Final Clinical Impressions(s) / ED Diagnoses   Final diagnoses:  Shortness of breath  Chest pain, unspecified type  Coronary artery disease involving native heart, angina presence unspecified, unspecified vessel or lesion type  Subtherapeutic international normalized ratio (INR)    New Prescriptions New Prescriptions   No medications on file     Alvira MondayErin Augusten Lipkin, MD 07/02/16 1705

## 2016-07-02 NOTE — ED Notes (Signed)
Per Marylene LandAngela, RN informed patient he was able to eat the food he had with him; helped to reposition him on the stretcher and gave him several paper towels and 2 cups of ice and 2 cups of water of which he asked for; patient getting his food out of his back pack; patient also asking about test results, told him I would have Marylene LandAngela, RN to come and speak to him; no other needs at this time; Marylene LandAngela, RN aware

## 2016-07-02 NOTE — ED Notes (Signed)
Warm blanket given; patient laying on stretcher watching television, no needs at this time

## 2016-07-02 NOTE — ED Notes (Signed)
Patient being transported to Radiology Department at this time 

## 2016-07-02 NOTE — ED Triage Notes (Addendum)
Pt reports having cp/sob x 3 days. Also using cocaine for past two nights. Has cardiac hx. ekg done at triage. Reports not taking his coumadin x 1 month.

## 2016-07-02 NOTE — Telephone Encounter (Signed)
UTR pt x3 Encounter complete.

## 2016-07-02 NOTE — H&P (Signed)
Date: 07/02/2016               Patient Name:  Timothy Peck MRN: [id number]  DOB: 12/09/1960 Age / Sex: 55 y.o., male   PCP: Doris Cheadleeepak Advani, MD         Medical Service: Internal Medicine Teaching Service         Attending Physician: Dr. Tyson Aliasuncan Thomas Vincent, MD    First Contact: Dr. Peggyann Juba'Sullivan Pager: [phone]  Second Contact: Dr. Dimple Caseyice Pager: [phone]       After Hours (After 5p/  First Contact Pager: [phone]  weekends / holidays): Second Contact Pager: [phone]   Chief Complaint: chest pain and dyspnea  History of Present Illness: Timothy Peck is a 55 year old man with history of CAD (s/p PCI w/ stent 2013), PE (in 2014, not on anticoagulation), CVA, asthma, and active cocaine abuse who presents with subacute shortness of breath and chest pain.  For the past month he has noticed progressively worsening shortness of breath, worse with exertion.  Over this time he has had a cough intermittently productive of Michl-brown sputum, one episode of hemoptysis with blood-tinged sputum last week, and wheezing.  He endorses PND, which he attributes to his longstanding OSA, and no orthopnea.  Following the dyspnea, in the last 2 weeks he has had constant, squeezing chest pain 10/10 in intensity, over his central and left chest.  The pain is worse with exertion, and he cannot tell if it radiates because of his chronic back pain.  He says he has been using cocaine to alleviate the pain, most recently 3 days ago.  Has not tried any Tylenol, NSAIDS, nitro.  He also reports night sweats, one episode of hematuria last week, daily bilateral frontal and apical headaches, nausea, vomiting, and diarrhea in the last 6 months.  In the last 6-12 months he thinks he has lost 50 lbs, with pant waist going from 36 -> 32".  He thinks cocaine and poor access to food have contributed to the weight loss.  He was seen by cardiologist Dr Herbie BaltimoreHarding on 04/21/2016 for follow-up of his CAD and intermittent CP and dyspnea.  Per  Dr Elissa HeftyHarding's clinic note, his last cath in 2014 showed patented stented LAD.  Ordered MPI stress test which has not been performed.     Meds:  No outpatient prescriptions have been marked as taking for the 07/02/16 encounter [facility](Hospital Encounter).   He has not taking any medicines in the last several months.   Allergies: Allergies as of 07/02/2016 - Review Complete 07/02/2016  Allergen Reaction Noted  . Ibuprofen Other (See Comments) 02/21/2012   Past Medical History:  Diagnosis Date  . Asthma   . Bilateral pulmonary embolism [facility](HCC) August 2014  . CAD S/P percutaneous coronary angioplasty 02/2012   NSTEMI - PCI to prox LAD  . Chronic anticoagulation December 2014   Switch from Xarelto to warfarin.  . Degenerative disc disease, cervical    Dx years ago  . Depression   . 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 Hypertension   . NSTEMI (non-ST elevated myocardial infarction) (HCC)    07/18/13, stable cath with patent stent; preserved EF by echo; follow-up Echo for CVA 09/2013: EF 60%, mild LVH. No RDW and a. No cardiac source for embolism noted. Bubble study was not performed  . NSTEMI (non-ST elevated myocardial infarction), 07/18/13, stable cath with patent stent. 01/31/2012  . OSA (obstructive sleep apnea)    Diagnosis long ago,  used to use CPAP, not currently on any treatment.  . Polysubstance abuse    Past history of cocaine and marijuana, both smoking. Also long standing tobacco abuse.   . Presence of drug coated stent in LAD coronary artery 02/2012   Promus Element DES 4.0 mm x 28 mm; converted from Brilinta to aspirin during stroke evaluation in February 2015    Family History: Father with MI in 62s. Mother with "brain bleed" in 35s  Social History:  Active cocaine abuse Current smoker, 1 ppd, 40+ pack year history Rare alcohol use, 1 beer per month Has been living in motel with his fiancee  Review of Systems: A complete ROS was negative except as per  HPI.  Physical Exam: Blood pressure 127/87, pulse 74, temperature 98.1 F (36.7 C), temperature source Oral, resp. rate 17, SpO2 99 %.  Physical Exam  Constitutional: He is oriented to person, place, and time.  Comfortable appearing man in no distress reporting 9/10 chest pain  HENT:  Head: Normocephalic and atraumatic.  Mouth/Throat: Oropharynx is clear and moist.  Eyes: Conjunctivae are normal. No scleral icterus.  Neck: Normal range of motion. Neck supple.  Cardiovascular: Normal rate and normal heart sounds.   Pulmonary/Chest: Effort normal and breath sounds normal.  Abdominal: Soft. He exhibits no distension. There is no guarding.  Diffuse tenderness to palpation  Musculoskeletal: He exhibits no edema or tenderness.  Diffuse tenderness to palpation over sternum and anterior chest wall  Neurological: He is alert and oriented to person, place, and time.  Psychiatric: He has a normal mood and affect. His behavior is normal.     CBC Latest Ref Rng & Units 07/02/2016 06/19/2016 03/09/2016  WBC 4.0 - 10.5 K/uL 9.2 7.7 5.4  Hemoglobin 13.0 - 17.0 g/dL 16.1 09.6 04.5  Hematocrit 39.0 - 52.0 % 40.2 39.9 40.9  Platelets 150 - 400 K/uL 247 214 236   CMP Latest Ref Rng & Units 07/02/2016 06/19/2016 03/09/2016  Glucose 65 - 99 mg/dL 409(W) 119(J) 88  BUN 6 - 20 mg/dL 19 16 13   Creatinine 0.61 - 1.24 mg/dL 4.78(G) 9.56(O) 1.30(Q)  Sodium 135 - 145 mmol/L 140 141 136  Potassium 3.5 - 5.1 mmol/L 3.9 3.9 3.8  Chloride 101 - 111 mmol/L 107 108 105  CO2 22 - 32 mmol/L 24 27 26   Calcium 8.9 - 10.3 mg/dL 9.2 9.3 6.5(H)  Total Protein 6.5 - 8.1 g/dL - 6.6 -  Total Bilirubin 0.3 - 1.2 mg/dL - 0.8 -  Alkaline Phos 38 - 126 U/L - 57 -  AST 15 - 41 U/L - 24 -  ALT 17 - 63 U/L - 27 -   Troponin (Point of Care Test)  Recent Labs  07/02/16 1605  TROPIPOC 0.00   Cardiac Panel (last 3 results)  Recent Labs  07/02/16 1554  TROPONINI <0.03   EKG 07/02/2016 NSR, R axis deviation,  inferolateral TWIs, no ST elevation or depression.  TWI new from prior 03/2016.  Chest Radiographs PA and Lat 07/02/2016 FINDINGS: There is a probable nipple shadow on the left. There is no edema or consolidation. Heart size and pulmonary vascularity are normal. No adenopathy. There are areas of degenerative change in the thoracic spine. A stent is noted in the left anterior descending coronary artery. There is postoperative change in the lower cervical spine region. No pneumothorax.  IMPRESSION: Suspect nipple shadow on the left; repeat study with nipple markers to confirm advised. No edema or consolidation.  CTA Chest PE 07/02/2016  IMPRESSION: 1. No pulmonary embolus. 2. No acute or focal lesion to explain the patient's chest pain or shortness of breath. 3. Stable hepatic and renal cysts.  Echocardiogram 09/2013 EF 60%, mild LVH, no wall motion abnormalities  Assessment & Plan by Problem: Active Problems:   Chest pain  55 year old man with history of CAD and thromboembolic disease presents with subacute atypical chest pain and dyspnea.  He has history of CAD and several risk fastors and has some changes on his EKG, placing him at moderate risk (HEART score 5) cardiac event.  #Chest Pain PE ruled out, cardiac vs MSK etiologies most likely. -Repeat EKG tomorrow or if symptoms change -Echo -Trend troponins -Telemetry -Consult cardiology for ischemic workup  #HTN Currently normotensive to mildly hypertensive, no home antihypertensives.  #Asthma Reports he has had asthma since childhood.  Is not wheezing and I do not this RAD is contributing to his current symptoms. -Monitory pulmonary status  #Cocaine Abuse -Discussed cessation and abstinence  Dispo: Admit patient to Observation with expected length of stay less than 2 midnights.  Signed: Alm BustardMatthew O'Sullivan, MD 07/02/2016, 1:10 PM  Pager: [phone]

## 2016-07-02 NOTE — ED Notes (Signed)
Patient brought back to room via wheelchair; patient undressed, in gown, on monitor, continuous pulse oximetry and blood pressure cuff 

## 2016-07-03 ENCOUNTER — Telehealth (HOSPITAL_COMMUNITY): Payer: Self-pay

## 2016-07-03 DIAGNOSIS — J45909 Unspecified asthma, uncomplicated: Secondary | ICD-10-CM | POA: Diagnosis not present

## 2016-07-03 DIAGNOSIS — I2510 Atherosclerotic heart disease of native coronary artery without angina pectoris: Secondary | ICD-10-CM | POA: Diagnosis not present

## 2016-07-03 DIAGNOSIS — I2589 Other forms of chronic ischemic heart disease: Secondary | ICD-10-CM | POA: Diagnosis not present

## 2016-07-03 DIAGNOSIS — Z79899 Other long term (current) drug therapy: Secondary | ICD-10-CM

## 2016-07-03 DIAGNOSIS — R0789 Other chest pain: Secondary | ICD-10-CM | POA: Diagnosis not present

## 2016-07-03 DIAGNOSIS — Z5321 Procedure and treatment not carried out due to patient leaving prior to being seen by health care provider: Secondary | ICD-10-CM | POA: Diagnosis not present

## 2016-07-03 DIAGNOSIS — R072 Precordial pain: Secondary | ICD-10-CM | POA: Diagnosis not present

## 2016-07-03 DIAGNOSIS — Z7982 Long term (current) use of aspirin: Secondary | ICD-10-CM

## 2016-07-03 DIAGNOSIS — F1410 Cocaine abuse, uncomplicated: Secondary | ICD-10-CM

## 2016-07-03 DIAGNOSIS — I10 Essential (primary) hypertension: Secondary | ICD-10-CM | POA: Diagnosis not present

## 2016-07-03 LAB — BASIC METABOLIC PANEL
Anion gap: 7 (ref 5–15)
BUN: 21 mg/dL — AB (ref 6–20)
CALCIUM: 8.5 mg/dL — AB (ref 8.9–10.3)
CHLORIDE: 107 mmol/L (ref 101–111)
CO2: 25 mmol/L (ref 22–32)
CREATININE: 1.38 mg/dL — AB (ref 0.61–1.24)
GFR calc non Af Amer: 57 mL/min — ABNORMAL LOW (ref >60)
Glucose, Bld: 87 mg/dL (ref 65–99)
Potassium: 3.6 mmol/L (ref 3.5–5.1)
SODIUM: 139 mmol/L (ref 135–145)

## 2016-07-03 LAB — HIV ANTIBODY (ROUTINE TESTING W REFLEX): HIV Screen 4th Generation wRfx: NONREACTIVE

## 2016-07-03 LAB — TROPONIN I: Troponin I: <0.03 ng/mL (ref <0.03)

## 2016-07-03 MED ORDER — ALUM & MAG HYDROXIDE-SIMETH 200-200-20 MG/5ML PO SUSP
30.0000 mL | Freq: Four times a day (QID) | ORAL | Status: DC | PRN
  Administered 2016-07-03: 30 mL via ORAL
  Filled 2016-07-03: qty 30

## 2016-07-03 NOTE — Progress Notes (Signed)
   Subjective: Continues to report chest pain with tenderness to palpation, exacerbated by movement.  Objective:  Vital signs in last 24 hours: Vitals:   07/02/16 1856 07/02/16 2147 07/03/16 0516 07/03/16 1246  BP: 138/85 133/87 105/65 111/75  Pulse: (!) 56 67 (!) 50 (!) 57  Resp: 18 18 20 20   Temp: 98.2 F (36.8 C) 98.4 F (36.9 C) 97.9 F (36.6 C) 98 F (36.7 C)  TempSrc: Oral Oral Oral Oral  SpO2: 98% 100% 96% 100%  Weight:   157 lb 1.6 oz (71.3 kg)   Height:   6\' 4"  (1.93 m)    Physical Exam  Constitutional: He is oriented to person, place, and time. He appears well-developed and well-nourished. No distress.  Cardiovascular: Normal rate and regular rhythm.   Pulmonary/Chest: Effort normal and breath sounds normal.  Neurological: He is alert and oriented to person, place, and time.  Psychiatric: He has a normal mood and affect. His behavior is normal.   Cardiac Panel (last 3 results)  Recent Labs  07/02/16 1554 07/02/16 2046 07/03/16 0245  TROPONINI <0.03 <0.03 <0.03   CBC Latest Ref Rng & Units 07/02/2016 06/19/2016 03/09/2016  WBC 4.0 - 10.5 K/uL 9.2 7.7 5.4  Hemoglobin 13.0 - 17.0 g/dL 16.114.4 09.613.8 04.513.6  Hematocrit 39.0 - 52.0 % 40.2 39.9 40.9  Platelets 150 - 400 K/uL 247 214 236   BMP Latest Ref Rng & Units 07/03/2016 07/02/2016 06/19/2016  Glucose 65 - 99 mg/dL 87 409(W105(H) 119(J111(H)  BUN 6 - 20 mg/dL 47(W21(H) 19 16  Creatinine 0.61 - 1.24 mg/dL 2.95(A1.38(H) 2.13(Y1.28(H) 8.65(H1.33(H)  Sodium 135 - 145 mmol/L 139 140 141  Potassium 3.5 - 5.1 mmol/L 3.6 3.9 3.9  Chloride 101 - 111 mmol/L 107 107 108  CO2 22 - 32 mmol/L 25 24 27   Calcium 8.9 - 10.3 mg/dL 8.4(O8.5(L) 9.2 9.3   EKG 96/29/528411/29/2017 NSR, R axis deviation, inferolateral TWIs, no ST elevation or depression.  TWI new from prior 03/2016.  EKG 07/03/2016 Inferolateral TWIs resolved.  Assessment/Plan:  Active Problems:   Chest pain  55 year old man with history of CAD and thromboembolic disease presents with subacute atypical  chest pain and dyspnea.  He has history of CAD and several risk fastors and has some changes on his EKG, placing him at moderate risk (HEART score 5) cardiac event.  #Chest Pain PE ruled out, cardiac vs MSK etiologies most likely. -Repeat EKG tomorrow or if symptoms change -Echo -Stress MPI study tomorrow per   #CAD -Aspirin 325 mg -atorvastatin 80 mg  #HTN Currently normotensive to mildly hypertensive, no home antihypertensives.  #Asthma Reports he has had asthma since childhood.  Is not wheezing and I do not think RAD is contributing to his current symptoms. -Monitory pulmonary status  #Cocaine Abuse -Discussed cessation and abstinence  Dispo: Anticipated discharge in approximately 1-2 day(s).   Alm BustardMatthew O'Sullivan, MD 07/03/2016, 3:22 PM Pager: [phone]

## 2016-07-03 NOTE — Telephone Encounter (Signed)
UTR Pt Encounter complete.

## 2016-07-03 NOTE — Progress Notes (Signed)
Responded to spiritual care consult to assist patient with completing AD.  Documents were completed and two copies were given to patient and one to nurse for patient chart. Provided emotional support and guidance.  Chaplain available as needed.   07/03/16 1400  Clinical Encounter Type  Visited With Patient;Health care provider  Visit Type Initial;Spiritual support  Referral From Nurse  Spiritual Encounters  Spiritual Needs Literature;Emotional  Stress Factors  Patient Stress Factors None identified;Health changes  Advance Directives (For Healthcare)  Does Patient Have a Medical Advance Directive? Yes  Type of Estate agentAdvance Directive Healthcare Power of [place];Living will  Copy of Healthcare Power of Attorney in Chart? Yes  Copy of Living Will in Chart? Yes  Timothy JarvisWatlington, Timothy Peck, [place], [place],pager [phone]

## 2016-07-03 NOTE — Clinical Social Work Note (Addendum)
Clinical Social Work Assessment  Patient Details  Name: Timothy Peck MRN: [id number] Date of Birth: 03/28/1961  Date of referral:  07/03/16               Reason for consult:  Discharge Planning                Permission sought to share information with:  Family Supports Permission granted to share information::  Yes, Verbal Permission Granted  Name::     Gaye Alken  Agency::     Relationship::  Fiance   Contact Information:  [phone]  Housing/Transportation Living arrangements for the past 2 months:  [place] of Information:  Patient Patient Interpreter Needed:  None Criminal Activity/Legal Involvement Pertinent to Current Situation/Hospitalization:  No - Comment as needed Significant Relationships:  Spouse Lives with:  Spouse Do you feel safe going back to the place where you live?  Yes Need for family participation in patient care:  Yes (Comment)  Care giving concerns:  Patient states he is from [place] and only family/friend he has in the area is Fiance  Veterinary surgeon).  Social Worker assessment / plan: Holiday representative met patient at bedside to offer support and discuss patient needs at discharge. Patient states he has been living in a hotel room with his fiance and before moving into the hotel room he was living in his car until it was taken from him. Patient did not want a list of shelters in the area.CSW also spoke to patient about utilizing The Department of Social Services as a resource but patient stated they will not help. Patient stated he has applied for housing authority but still has not yet been approved. Patient asked for a letter from the Physician to try and speed up the process. CSW stated to patient she will ask doctor to write a letter stating patients medical status to bring to housing authority. Patient disclosed to CSW that he does use cocaine recreationally to relive stress. Patient stated that if he  does not find housing he will go back out and use cocaine and return to [facility]. CSW encouraged patient to find positive ways to coupe instead of using substances and CSW will give  patient resources on positive coping skills. CSW remains available for support and to facilitate patient discharge once medically ready. Employment status:  Unemployed Forensic scientist:  Medicaid In [place] PT Recommendations:  No Follow Up Information / Referral to community resources:  Shelter, Other (Comment Required) (Department of socia services and [employer])  Patient/Family's Response to care: Patient verbalized appreciation and understanding for CSW role and involvement in care.  Patient/Family's Understanding of and Emotional Response to Diagnosis, Current Treatment, and Prognosis:  Patient with good understanding of current medical state and limitations around most recent hospitalization. Patient aware that if he uses cocaine it will negatively affect his health, but patient stated "if he gets stress out or upset he will use cocaine again and just come back to the hospital". Emotional Assessment Appearance:  Appears stated age Attitude/Demeanor/Rapport:   (Attitude/demeanor appropriate) Affect (typically observed):  Happy, Pleasant, Overwhelmed Orientation:  Oriented to  Time, Oriented to Place, Oriented to Self, Oriented to Situation Alcohol / Substance use:  Illicit Drugs (cocaine use) Psych involvement (Current and /or in the community):  No (Comment)  Discharge Needs  Concerns to be addressed:  Discharge Planning Concerns, Substance Abuse Concerns, Homelessness Readmission within the last 30 days:  No Current discharge  risk:  Homeless, Lack of support system, Other (substance use) Barriers to Discharge:  Barriers Resolved, Homeless with medical needs (Lives in hotel with fiance)   Wende Neighbors, [place] 07/03/2016, 5:14 PM

## 2016-07-03 NOTE — Consult Note (Signed)
Primary cardiologist: Dr Ellyn Hack  HPI: 55 year old male with past medical history of hyperlipidemia, coronary artery disease, tobacco abuse, substance abuse, noncompliance for evaluation of chest pain. Patient had PCI of his LAD in the setting of myocardial infarction in 2013. He had repeat catheterizations in May 2014 and December 2014 and the stents were widely patent. He also has a history of pulmonary embolus. He has been noncompliant with medications and follow-up. He did cocaine 4-5 days ago. Patient states that he has had intermittent chest pain for approximately one week. It is substernal and feels "like someone hitting me in the chest". No radiation. Some increase with certain movements and inspiration. No associated symptoms. Not exertional. Typically last 1 hour and resolves spontaneously. He also complains of dyspnea on exertion but also at rest. He describes a productive cough. Cardiology asked to evaluate.  Medications Prior to Admission  Medication Sig Dispense Refill  . aspirin EC 81 MG tablet Take 1 tablet (81 mg total) by mouth daily. (Patient not taking: Reported on 07/02/2016) 90 tablet 3  . atorvastatin (LIPITOR) 80 MG tablet Take 1 tablet (80 mg total) by mouth at bedtime. (Patient not taking: Reported on 07/02/2016) 30 tablet 11  . isosorbide dinitrate (ISORDIL) 20 MG tablet Take 1 tablet (20 mg total) by mouth 2 (two) times daily. (Patient not taking: Reported on 07/02/2016) 60 tablet 11  . lisinopril (PRINIVIL,ZESTRIL) 5 MG tablet Take 1 tablet (5 mg total) by mouth daily. (Patient not taking: Reported on 07/02/2016) 30 tablet 11  . mometasone-formoterol (DULERA) 100-5 MCG/ACT AERO Inhale 2 puffs into the lungs 2 (two) times daily. (Patient not taking: Reported on 07/02/2016) 1 Inhaler 5    Allergies  Allergen Reactions  . Ibuprofen Other (See Comments)    Doctor told him he could not take    Past Medical History:  Diagnosis Date  . Asthma   . Bilateral pulmonary  embolism [facility]) August 2014  . CAD S/P percutaneous coronary angioplasty 02/2012   NSTEMI - PCI to prox LAD  . Chronic anticoagulation December 2014   Switch from Xarelto to warfarin.  . Degenerative disc disease, cervical    Dx years ago  . Depression   . 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 Hypertension   . NSTEMI (non-ST elevated myocardial infarction) ([place])    07/18/13, stable cath with patent stent; preserved EF by echo; follow-up Echo for CVA 09/2013: EF 60%, mild LVH. No RDW and a. No cardiac source for embolism noted. Bubble study was not performed  . NSTEMI (non-ST elevated myocardial infarction), 07/18/13, stable cath with patent stent. 01/31/2012  . OSA (obstructive sleep apnea)    Diagnosis long ago, used to use CPAP, not currently on any treatment.  . Polysubstance abuse    Past history of cocaine and marijuana, both smoking. Also long standing tobacco abuse.   . Presence of drug coated stent in LAD coronary artery 02/2012   Promus Element DES 4.0 mm x 28 mm; converted from Brilinta to aspirin during stroke evaluation in February 2015    Past Surgical History:  Procedure Laterality Date  . ANTERIOR CERVICAL DECOMP/DISCECTOMY FUSION  11/28/2011   Procedure: ANTERIOR CERVICAL DECOMPRESSION/DISCECTOMY FUSION 1 LEVEL;  Surgeon: Charlie Pitter, MD;  Location: [facility];  Service: Neurosurgery;  Laterality: N/A;  Anterior Cervical Six-Seven Decompression and Fusion  . LEFT HEART CATHETERIZATION WITH CORONARY ANGIOGRAM N/A 01/31/2012   Procedure: LEFT HEART CATHETERIZATION WITH CORONARY ANGIOGRAM;  Surgeon: Lorretta Harp,  MD;  Location: [facility] CATH LAB;  Service: Cardiovascular: NSTEMI -  p-mLAD ~80% thrombotic lesion -> treated with [id number] Inhibitor & staged PCI  . LEFT HEART CATHETERIZATION WITH CORONARY ANGIOGRAM N/A 12/17/2012   Procedure: LEFT HEART CATHETERIZATION WITH CORONARY ANGIOGRAM;  Surgeon: Marykay Lex, MD;  Location: [facility] CATH LAB;  Service: Cardiovascular:  Patent LAD DES.  CP thought to be Non-anginal  . LEFT HEART CATHETERIZATION WITH CORONARY ANGIOGRAM N/A 07/18/2013   Procedure: LEFT HEART CATHETERIZATION WITH CORONARY ANGIOGRAM;  Surgeon: Marykay Lex, MD;  Location: [facility] CATH LAB;  Service: Cardiovascular: patent stent, stable CAD.  ? Sx related to Myopericarditis.  Marland Kitchen PERCUTANEOUS CORONARY STENT INTERVENTION (PCI-S) N/A 02/02/2012   Procedure: PERCUTANEOUS CORONARY STENT INTERVENTION (PCI-S);  Surgeon: Marykay Lex, MD;  Location: [facility] CATH LAB;  Service: Cardiovascular: Staged LAD PCI - Promus DES 4x28.    . TUMOR REMOVAL     left foot third toe    Social History   Social History  . Marital status: Divorced    Spouse name: N/A  . Number of children: N/A  . Years of education: N/A   Occupational History  . Not on file.   Social History Main Topics  . Smoking status: Current Every Day Smoker    Packs/day: 1.00    Years: 42.00    Types: Cigarettes    Start date: 01/12/1971  . Smokeless tobacco: Never Used  . Alcohol use No     Comment: Report being abstinent since ~January 2015  . Drug use:     Types: Cocaine, Marijuana, Other-see comments     Comment: sts hasnt used cocaine in years. but used marijuana daily.   Marland Kitchen Sexual activity: Yes    Birth control/ protection: None   Other Topics Concern  . Not on file   Social History Narrative   He tells me that since his father-in-law, who they were caring for completely, died in 12-19-2017he and his wife have basically been homeless because the remainder of her family to call the money and belongings. He currently is on disability, therefore is unable to really work without losing his disability. She is also on disability making it very difficult. They're currently living in a motel to be off the streets. They live in a car for several months. As result, he has not been getting his medications filled.    Family History  Problem Relation Age of Onset  . Coronary artery disease  Father   . Stroke Father 56  . Heart disease Father   . Diabetes Father   . Aneurysm Mother 45    intracranial aneurysm rupture  . Hypertension Mother   . Hypertension Maternal Grandmother   . Diabetes Maternal Grandmother   . Hypertension Paternal Grandmother   . Hypertension Paternal Grandfather   . Cancer Maternal Aunt   . Anesthesia problems Neg Hx   . Hypotension Neg Hx   . Malignant hyperthermia Neg Hx   . Pseudochol deficiency Neg Hx     ROS:  Patient describes productive cough but no fevers or chills, dysphasia, odynophagia, melena, hematochezia, dysuria, hematuria, rash, seizure activity, orthopnea, PND, pedal edema, claudication. Remaining systems are negative.  Physical Exam:   Blood pressure 111/75, pulse (!) 57, temperature 98 F (36.7 C), temperature source Oral, resp. rate 20, height 6\' 4"  (1.93 m), weight 157 lb 1.6 oz (71.3 kg), SpO2 100 %.  General:  Well developed/well nourished in NAD Skin warm/dry Patient not depressed No peripheral  clubbing Back-normal HEENT-normal/normal eyelids Neck supple/normal carotid upstroke bilaterally; no bruits; no JVD; no thyromegaly chest - CTA/ normal expansion CV - RRR/normal S1 and S2; no murmurs, rubs or gallops;  PMI nondisplaced Abdomen -Mild tenderness to palpation/ND, no HSM, no mass, + bowel sounds, no bruit 2+ femoral pulses, no bruits Ext-no edema, chords, 2+ DP Neuro-grossly nonfocal  ECG sinus rhythm, right axis deviation, right atrial enlargement, left ventricular hypertrophy, T-wave inversion inferior lateral leads.  Results for orders placed or performed during the hospital encounter of 07/02/16 (from the past 48 hour(s))  Basic metabolic panel     Status: Abnormal   Collection Time: 07/02/16  9:41 AM  Result Value Ref Range   Sodium 140 135 - 145 mmol/L   Potassium 3.9 3.5 - 5.1 mmol/L   Chloride 107 101 - 111 mmol/L   CO2 24 22 - 32 mmol/L   Glucose, Bld 105 (H) 65 - 99 mg/dL   BUN 19 6 - 20 mg/dL     Creatinine, Ser 1.28 (H) 0.61 - 1.24 mg/dL   Calcium 9.2 8.9 - 10.3 mg/dL   GFR calc non Af Amer >60 >60 mL/min   GFR calc Af Amer >60 >60 mL/min    Comment: (NOTE) The eGFR has been calculated using the CKD EPI equation. This calculation has not been validated in all clinical situations. eGFR's persistently <60 mL/min signify possible Chronic Kidney Disease.    Anion gap 9 5 - 15  CBC     Status: None   Collection Time: 07/02/16  9:41 AM  Result Value Ref Range   WBC 9.2 4.0 - 10.5 K/uL   RBC 4.46 4.22 - 5.81 MIL/uL   Hemoglobin 14.4 13.0 - 17.0 g/dL   HCT 40.2 39.0 - 52.0 %   MCV 90.1 78.0 - 100.0 fL   MCH 32.3 26.0 - 34.0 pg   MCHC 35.8 30.0 - 36.0 g/dL   RDW 13.0 11.5 - 15.5 %   Platelets 247 150 - 400 K/uL  Protime-INR (order if Patient is taking Coumadin / Warfarin)     Status: None   Collection Time: 07/02/16  9:41 AM  Result Value Ref Range   Prothrombin Time 13.5 11.4 - 15.2 seconds   INR 1.03   I-stat troponin, ED     Status: None   Collection Time: 07/02/16  9:58 AM  Result Value Ref Range   Troponin i, poc 0.00 0.00 - 0.08 ng/mL   Comment 3            Comment: Due to the release kinetics of cTnI, a negative result within the first hours of the onset of symptoms does not rule out myocardial infarction with certainty. If myocardial infarction is still suspected, repeat the test at appropriate intervals.   Urinalysis, Routine w reflex microscopic (not at [facility])     Status: Abnormal   Collection Time: 07/02/16  3:30 PM  Result Value Ref Range   Color, Urine AMBER (A) YELLOW    Comment: BIOCHEMICALS MAY BE AFFECTED BY COLOR   APPearance CLEAR CLEAR   Specific Gravity, Urine 1.023 1.005 - 1.030   pH 7.0 5.0 - 8.0   Glucose, UA NEGATIVE NEGATIVE mg/dL   Hgb urine dipstick NEGATIVE NEGATIVE   Bilirubin Urine NEGATIVE NEGATIVE   Ketones, ur NEGATIVE NEGATIVE mg/dL   Protein, ur NEGATIVE NEGATIVE mg/dL   Nitrite NEGATIVE NEGATIVE   Leukocytes, UA NEGATIVE  NEGATIVE    Comment: MICROSCOPIC NOT DONE ON URINES WITH NEGATIVE PROTEIN,  BLOOD, LEUKOCYTES, NITRITE, OR GLUCOSE <1000 mg/dL.  Troponin I (q 6hr x 3)     Status: None   Collection Time: 07/02/16  3:54 PM  Result Value Ref Range   Troponin I <0.03 <0.03 ng/mL  HIV antibody     Status: None   Collection Time: 07/02/16  3:54 PM  Result Value Ref Range   HIV Screen 4th Generation wRfx Non Reactive Non Reactive    Comment: (NOTE) Performed At: [facility] [address] [place], [place] [id number] Mila Homer MD AX:[id number]   I-stat troponin, ED     Status: None   Collection Time: 07/02/16  4:05 PM  Result Value Ref Range   Troponin i, poc 0.00 0.00 - 0.08 ng/mL   Comment 3            Comment: Due to the release kinetics of cTnI, a negative result within the first hours of the onset of symptoms does not rule out myocardial infarction with certainty. If myocardial infarction is still suspected, repeat the test at appropriate intervals.   Troponin I (q 6hr x 3)     Status: None   Collection Time: 07/02/16  8:46 PM  Result Value Ref Range   Troponin I <0.03 <0.03 ng/mL  Troponin I (q 6hr x 3)     Status: None   Collection Time: 07/03/16  2:45 AM  Result Value Ref Range   Troponin I <0.03 <0.03 ng/mL  Basic metabolic panel     Status: Abnormal   Collection Time: 07/03/16  2:45 AM  Result Value Ref Range   Sodium 139 135 - 145 mmol/L   Potassium 3.6 3.5 - 5.1 mmol/L   Chloride 107 101 - 111 mmol/L   CO2 25 22 - 32 mmol/L   Glucose, Bld 87 65 - 99 mg/dL   BUN 21 (H) 6 - 20 mg/dL   Creatinine, Ser 6.39 (H) 0.61 - 1.24 mg/dL   Calcium 8.5 (L) 8.9 - 10.3 mg/dL   GFR calc non Af Amer 57 (L) >60 mL/min   GFR calc Af Amer >60 >60 mL/min    Comment: (NOTE) The eGFR has been calculated using the CKD EPI equation. This calculation has not been validated in all clinical situations. eGFR's persistently <60 mL/min signify possible Chronic Kidney Disease.    Anion gap 7  5 - 15    Dg Chest 2 View  Result Date: 07/02/2016 CLINICAL DATA:  Chest pain for 3 days EXAM: CHEST  2 VIEW COMPARISON:  Chest radiograph and chest CT March 09, 2016 FINDINGS: There is a probable nipple shadow on the left. There is no edema or consolidation. Heart size and pulmonary vascularity are normal. No adenopathy. There are areas of degenerative change in the thoracic spine. A stent is noted in the left anterior descending coronary artery. There is postoperative change in the lower cervical spine region. No pneumothorax. IMPRESSION: Suspect nipple shadow on the left; repeat study with nipple markers to confirm advised. No edema or consolidation. Electronically Signed   By: Bretta Bang III M.D.   On: 07/02/2016 10:09   Ct Angio Chest Pe W And/or Wo Contrast  Result Date: 07/02/2016 CLINICAL DATA:  Shortness of breath and chest pain. Personal history pulmonary embolus. The patient has stopped his Coumadin. EXAM: CT ANGIOGRAPHY CHEST WITH CONTRAST TECHNIQUE: Multidetector CT imaging of the chest was performed using the standard protocol during bolus administration of intravenous contrast. Multiplanar CT image reconstructions and MIPs were obtained to evaluate  the vascular anatomy. CONTRAST:  100 mL Isovue 370 COMPARISON:  CT AAA of the chest 03/09/2016. CT of the abdomen and pelvis 01/02/2015. FINDINGS: Cardiovascular: Pulmonary arterial opacification is excellent. There are no focal filling defects to suggest pulmonary emboli. Heart size is normal. There is no significant pericardial effusion. Coronary artery stents are noted. Mediastinum/Nodes: No significant mediastinal or axillary adenopathy is present. Lungs/Pleura: The lungs are clear. No focal nodule, mass, or airspace disease is present. Upper Abdomen: Hepatic cysts are stable. A left renal cyst is stable is well. No acute or new lesions are present Musculoskeletal: Bone windows are unremarkable. No focal lytic or blastic lesions are  present. Cervical spinal fusion is noted. Review of the MIP images confirms the above findings. IMPRESSION: 1. No pulmonary embolus. 2. No acute or focal lesion to explain the patient's chest pain or shortness of breath. 3. Stable hepatic and renal cysts. Electronically Signed   By: San Morelle M.D.   On: 07/02/2016 14:18    Assessment/Plan 1 chest pain-symptoms are very atypical. Also occurred in the setting of cocaine use. He is noted to have T-wave changes in the inferior lateral leads that resolved on follow-up electrocardiogram. We will plan a stress nuclear study tomorrow morning for risk stratification.  2 tobacco abuse-patient counseled on discontinuing.  3 cocaine abuse-patient counseled on discontinuing.   4 noncompliance-patient counseled on the importance of taking his medications and follow-up.  5 coronary artery disease-continue aspirin and statin. Note he had not taken any medications for 1 month prior to admission.     Kirk Ruths MD 07/03/2016, 1:30 PM

## 2016-07-04 ENCOUNTER — Observation Stay (HOSPITAL_COMMUNITY): Payer: Medicaid Other

## 2016-07-04 ENCOUNTER — Observation Stay (HOSPITAL_BASED_OUTPATIENT_CLINIC_OR_DEPARTMENT_OTHER): Payer: Medicaid Other

## 2016-07-04 DIAGNOSIS — R0789 Other chest pain: Secondary | ICD-10-CM | POA: Diagnosis not present

## 2016-07-04 DIAGNOSIS — Z5321 Procedure and treatment not carried out due to patient leaving prior to being seen by health care provider: Secondary | ICD-10-CM

## 2016-07-04 DIAGNOSIS — R079 Chest pain, unspecified: Secondary | ICD-10-CM | POA: Diagnosis not present

## 2016-07-04 DIAGNOSIS — R072 Precordial pain: Secondary | ICD-10-CM | POA: Diagnosis not present

## 2016-07-04 LAB — NM MYOCAR MULTI W/SPECT W/WALL MOTION / EF
CHL CUP MPHR: 167 {beats}/min
CSEPED: 0 min
CSEPEDS: 0 s
CSEPHR: 52 %
CSEPPHR: 88 {beats}/min
Estimated workload: 1 METS
RPE: 0
Rest HR: 57 {beats}/min

## 2016-07-04 LAB — ECHOCARDIOGRAM COMPLETE
HEIGHTINCHES: 76 in
Weight: 2513.6 oz

## 2016-07-04 MED ORDER — AMINOPHYLLINE 25 MG/ML IV SOLN
INTRAVENOUS | Status: AC
  Filled 2016-07-04: qty 10

## 2016-07-04 MED ORDER — REGADENOSON 0.4 MG/5ML IV SOLN
INTRAVENOUS | Status: AC
  Administered 2016-07-04: 09:00:00
  Filled 2016-07-04: qty 5

## 2016-07-04 MED ORDER — REGADENOSON 0.4 MG/5ML IV SOLN
0.4000 mg | Freq: Once | INTRAVENOUS | Status: AC
  Administered 2016-07-04: 0.4 mg via INTRAVENOUS
  Filled 2016-07-04: qty 5

## 2016-07-04 MED ORDER — TECHNETIUM TC 99M TETROFOSMIN IV KIT
30.0000 | PACK | Freq: Once | INTRAVENOUS | Status: AC | PRN
  Administered 2016-07-04: 30 via INTRAVENOUS

## 2016-07-04 MED ORDER — TECHNETIUM TC 99M TETROFOSMIN IV KIT
10.0000 | PACK | Freq: Once | INTRAVENOUS | Status: AC | PRN
  Administered 2016-07-04: 10 via INTRAVENOUS

## 2016-07-04 NOTE — Progress Notes (Signed)
Dr. Jens Somrenshaw has reviewed nuc study and pt will need to stay over weekend and have cath on Monday.   I will notify pt.

## 2016-07-04 NOTE — Progress Notes (Signed)
Patient Name: Timothy Peck Date of Encounter: 07/04/2016  Primary Cardiologist: Dr. Kristeen MissHarding   Hospital Problem List     Active Problems:   Chest pain     Subjective   Continues with chest pain has never resolved. + chest wall tenderness.  Inpatient Medications    Scheduled Meds: . aminophylline      . regadenoson      . aspirin EC  325 mg Oral Daily  . atorvastatin  80 mg Oral QHS  . enoxaparin (LOVENOX) injection  40 mg Subcutaneous Q24H  . feeding supplement (ENSURE ENLIVE)  237 mL Oral BID BM  . isosorbide dinitrate  20 mg Oral BID  . mometasone-formoterol  2 puff Inhalation BID  . multivitamin with minerals  1 tablet Oral Daily  . sodium chloride flush  3 mL Intravenous Q12H  . thiamine  100 mg Oral Daily   Continuous Infusions:  PRN Meds: acetaminophen **OR** acetaminophen, alum & mag hydroxide-simeth, ipratropium-albuterol, nitroGLYCERIN, ondansetron **OR** ondansetron (ZOFRAN) IV   Vital Signs    Vitals:   07/03/16 1246 07/03/16 2054 07/04/16 0431 07/04/16 0846  BP: 111/75 134/85 105/67 106/81  Pulse: (!) 57 60 71 (!) 57  Resp: 20 18 20    Temp: 98 F (36.7 C) 98.4 F (36.9 C) 98 F (36.7 C)   TempSrc: Oral Oral Oral   SpO2: 100% 97% 98%   Weight:      Height:        Intake/Output Summary (Last 24 hours) at 07/04/16 0915 Last data filed at 07/03/16 1700  Gross per 24 hour  Intake              720 ml  Output                0 ml  Net              720 ml   Filed Weights   07/03/16 0516  Weight: 157 lb 1.6 oz (71.3 kg)    Physical Exam   GEN: Well nourished,  in no acute distress.  HEENT: normocephalic, sclera clear, mucus membranes moist.  Neck: Supple, no JVD,  or masses. Cardiac: RRR, no murmurs, rubs, or gallops. No clubbing, cyanosis, edema.  Radials 2+ and equal bilaterally. + chest pain to palpation  Respiratory:  Respirations regular and unlabored, clear to auscultation bilaterally without rales, rhonchi or wheezes. GI: Abd -Soft,  nontender, nondistended, BS + x 4. MS: no deformity or atrophy. Skin: warm and dry, brisk capillary refill, no obvious rash Neuro:  Alert and oriented X 3 MAE, follows commands Psych: answers questions appropriately,Normal and pleasant affect.   Labs    CBC  Recent Labs  07/02/16 0941  WBC 9.2  HGB 14.4  HCT 40.2  MCV 90.1  PLT 247   Basic Metabolic Panel  Recent Labs  07/02/16 0941 07/03/16 0245  NA 140 139  K 3.9 3.6  CL 107 107  CO2 24 25  GLUCOSE 105* 87  BUN 19 21*  CREATININE 1.28* 1.38*  CALCIUM 9.2 8.5*   Liver Function Tests No results for input(s): AST, ALT, ALKPHOS, BILITOT, PROT, ALBUMIN in the last 72 hours. No results for input(s): LIPASE, AMYLASE in the last 72 hours. Cardiac Enzymes  Recent Labs  07/02/16 1554 07/02/16 2046 07/03/16 0245  TROPONINI <0.03 <0.03 <0.03   BNP Invalid input(s): POCBNP D-Dimer No results for input(s): DDIMER in the last 72 hours. Hemoglobin A1C No results for input(s): HGBA1C in the  last 72 hours. Fasting Lipid Panel No results for input(s): CHOL, HDL, LDLCALC, TRIG, CHOLHDL, LDLDIRECT in the last 72 hours. Thyroid Function Tests No results for input(s): TSH, T4TOTAL, T3FREE, THYROIDAB in the last 72 hours.  Invalid input(s): FREET3  Telemetry    Mena PaulsS Brady to 4249 - Personally Reviewed  ECG    No new EKGs except with stress test, SRD - Personally Reviewed  Radiology    Dg Chest 2 View  Result Date: 07/02/2016 CLINICAL DATA:  Chest pain for 3 days EXAM: CHEST  2 VIEW COMPARISON:  Chest radiograph and chest CT March 09, 2016 FINDINGS: There is a probable nipple shadow on the left. There is no edema or consolidation. Heart size and pulmonary vascularity are normal. No adenopathy. There are areas of degenerative change in the thoracic spine. A stent is noted in the left anterior descending coronary artery. There is postoperative change in the lower cervical spine region. No pneumothorax. IMPRESSION: Suspect  nipple shadow on the left; repeat study with nipple markers to confirm advised. No edema or consolidation. Electronically Signed   By: Bretta BangWilliam  Woodruff III M.D.   On: 07/02/2016 10:09   Ct Angio Chest Pe W And/or Wo Contrast  Result Date: 07/02/2016 CLINICAL DATA:  Shortness of breath and chest pain. Personal history pulmonary embolus. The patient has stopped his Coumadin. EXAM: CT ANGIOGRAPHY CHEST WITH CONTRAST TECHNIQUE: Multidetector CT imaging of the chest was performed using the standard protocol during bolus administration of intravenous contrast. Multiplanar CT image reconstructions and MIPs were obtained to evaluate the vascular anatomy. CONTRAST:  100 mL Isovue 370 COMPARISON:  CT AAA of the chest 03/09/2016. CT of the abdomen and pelvis 01/02/2015. FINDINGS: Cardiovascular: Pulmonary arterial opacification is excellent. There are no focal filling defects to suggest pulmonary emboli. Heart size is normal. There is no significant pericardial effusion. Coronary artery stents are noted. Mediastinum/Nodes: No significant mediastinal or axillary adenopathy is present. Lungs/Pleura: The lungs are clear. No focal nodule, mass, or airspace disease is present. Upper Abdomen: Hepatic cysts are stable. A left renal cyst is stable is well. No acute or new lesions are present Musculoskeletal: Bone windows are unremarkable. No focal lytic or blastic lesions are present. Cervical spinal fusion is noted. Review of the MIP images confirms the above findings. IMPRESSION: 1. No pulmonary embolus. 2. No acute or focal lesion to explain the patient's chest pain or shortness of breath. 3. Stable hepatic and renal cysts. Electronically Signed   By: Marin Robertshristopher  Mattern M.D.   On: 07/02/2016 14:18    Cardiac Studies   nuc study pending  Patient Profile     55 year old male with past medical history of hyperlipidemia, coronary artery disease, tobacco abuse, substance abuse, noncompliance for evaluation of chest  pain. Patient had PCI of his LAD in the setting of myocardial infarction in 2013. He had repeat catheterizations in May 2014 and December 2014 and the stents were widely patent. He also has a history of pulmonary embolus. He has been noncompliant with medications and follow-up. He did cocaine 4-5 days ago. Patient states that he has had intermittent chest pain for approximately one week. It is substernal and feels "like someone hitting me in the chest". No radiation. Some increase with certain movements and inspiration. No associated symptoms. Not exertional. Typically last 1 hour and resolves spontaneously. He also complains of dyspnea on exertion but also at rest. He describes a productive cough. Cardiology asked to evaluate  Assessment & Plan    Assessment/Plan 1  chest pain-symptoms are very atypical. Also occurred in the setting of cocaine use. He is noted to have T-wave changes in the inferior lateral leads that resolved on follow-up electrocardiogram. stress nuclear study pending./  His chest pain hs continued all night  2 tobacco abuse-patient counseled on discontinuing.  3 cocaine abuse-patient counseled on discontinuing.   4 noncompliance-patient counseled on the importance of taking his medications and follow-up.  5 coronary artery disease-continue aspirin and statin. Note he had not taken any medications for 1 month prior to admission.     Signed, Nada Boozer, NP  07/04/2016, 9:15 AM  [facility] Pager [phone]  After 5 or weekends [phone] As above, patient seen and examined. Mild chest pain during stress test. We will await final results. If normal will treat medically. I again discussed importance of compliance with medications, follow-up, avoiding tobacco and cocaine use. Patient should follow-up with Dr. Herbie Baltimore after discharge. Olga Millers, MD

## 2016-07-04 NOTE — Progress Notes (Signed)
   Subjective: Minimal atypical chest pain overnight. This pain is reproducible with palpation of the sternum. No additional acute events overnight.  Objective:  Vital signs in last 24 hours: Vitals:   07/04/16 0921 07/04/16 0922 07/04/16 0924 07/04/16 0925  BP: (!) 125/91 (!) 156/88 (!) 145/90   Pulse: 83 79 82 75  Resp:      Temp:      TempSrc:      SpO2:      Weight:      Height:       Physical Exam  Constitutional: He is oriented to person, place, and time. He appears well-developed and well-nourished.  HENT:  Head: Normocephalic and atraumatic.  Cardiovascular: Normal rate and regular rhythm.  Exam reveals no gallop and no friction rub.   No murmur heard. Respiratory: Effort normal and breath sounds normal.  GI: Soft. Bowel sounds are normal. He exhibits no distension. There is no tenderness.  Musculoskeletal: He exhibits tenderness. He exhibits no edema.  Minimal tenderness to palpation over the sternum, most noticeable over the left sternal border  Neurological: He is alert and oriented to person, place, and time.     Assessment/Plan: [name] is a 55 year old male who presented with atypical chest pain in the setting of cocaine abuse and medication noncompliance.  1. Chest pain Pulmonary embolism ruled out, the patient's chest pain is most likely muscloskeletal in etiology and is reproducible to palpation. Cardiology is following and also thinks the patient's pain is atypical and does not represent acute coronary ischemia. He had a nuclear stress test this morning to evaluate for reversible ischemia. We will follow-up with results. -- Follow nuclear stress test results -- Continue aggressive secondary prevention as the patient has had prior cardiac events -- Follow cardiology recommendations  2. History of coronary artery disease -- Aspirin 325 mg -- Atorvastatin 80 mg  3. Asthma Monitor clinically  DVT/PE prophylaxis: Lovenox 40 mg FEN/GI: Regular  diet  Dispo: Anticipated discharge this afternoon pending official read of cardiac imaging.  Thomasene LotJames Cerenity Goshorn, MD 07/04/2016, 12:31 PM Pager: [phone]

## 2016-07-04 NOTE — Progress Notes (Signed)
lexiscan portion of nuc study completed, results to follow.

## 2016-07-04 NOTE — Progress Notes (Signed)
Patient  became extremely frustrated, became verbally threatening to nurse tech, and yelling at nurses desk, called security. Patient is leaving AMA, refused to sign papers. IV and telemetry has been removed. Explained to patient the risks of leaving and he does not care. Patient walked off of the unit with belongings.  Minerva Endsiffany N Bradden Tadros RN

## 2016-07-04 NOTE — Progress Notes (Signed)
This afternoon the patient was extremely frustrated and was threatening to leave AGAINST MEDICAL ADVICE. However, I spent approximatly 45 minutes with the patient as did the cardiology advanced practitioner and the patient has decided to stay until Monday for his cardiac catheterization. He remained frustrated but told us he was willing stay Monday to ensure that we figure out what is causing his chest pain.

## 2016-07-06 NOTE — Discharge Summary (Signed)
   Name: Timothy Peck MRN: [id number] DOB: 07/23/1961 55 y.o. PCP: Doris Cheadleeepak Advani, MD  Date of Admission: 07/02/2016 10:41 AM Date of Discharge: 07/06/2016 Attending Physician: No att. providers found  Discharge Diagnosis: 1. Chest pain 2. Left AGAINST MEDICAL ADVICE Active Problems:   Chest pain   Discharge Medications:   Medication List    TAKE these medications   aspirin EC 81 MG tablet Take 1 tablet (81 mg total) by mouth daily.   atorvastatin 80 MG tablet Commonly known as:  LIPITOR Take 1 tablet (80 mg total) by mouth at bedtime.   isosorbide dinitrate 20 MG tablet Commonly known as:  ISORDIL Take 1 tablet (20 mg total) by mouth 2 (two) times daily.   lisinopril 5 MG tablet Commonly known as:  PRINIVIL,ZESTRIL Take 1 tablet (5 mg total) by mouth daily.   mometasone-formoterol 100-5 MCG/ACT Aero Commonly known as:  DULERA Inhale 2 puffs into the lungs 2 (two) times daily.       Disposition and follow-up:   Timothy Peck was [facility] in Stable condition.  At the hospital follow up visit please address:  1.  Patient will need follow-up of his chest pain as he left AGAINST MEDICAL ADVICE.  2.  Labs / imaging needed at time of follow-up: None  3.  Pending labs/ test needing follow-up: None  Follow-up Appointments:  nonscheduled as the patient left AGAINST MEDICAL ADVICE  Hospital Course by problem list: Active Problems:   Chest pain   1. Chest pain The patient [facility] to [facility] emergency department on 07/02/2016 with subacute shortness of breath and chest pain. The patient has had cardiac events in the past and was considered high risk so he was admitted for further cardiac evaluation. His chest pain was in the setting of active cocaine abuse and medication noncompliance. Additionally, his chest pain is reproducible on palpation. While in the hospital he was evaluated by cardiology and underwent a nuclear  stress test which was read as intermediate risk. Following this test cardiology recommended that the patient stay over the weekend and proceed with cardiac catheterization on Monday for further evaluation of coronary anatomy. However, the patient got angry and did not want to stay. I personally along with his nurse and the cardiology advanced practitioner spent approximately 30 minutes with the patient discussing why he should stay to have the cardiac catheterization completed. Following this conversation the patient agreed that he would stay however he left AGAINST MEDICAL ADVICE several hours later. The exact etiology of the patient's chest pain was never fully determined that he remains intermediate risk based on nuclear imaging. If he does follow up in the outpatient setting he will need further evaluation of his recent admission for chest pain.  Discharge Vitals:   BP 128/82 (BP Location: Right Arm)   Pulse (!) 55   Temp 98.1 F (36.7 C) (Oral)   Resp (!) 21   Ht 6\' 4"  (1.93 m)   Wt 157 lb 1.6 oz (71.3 kg)   SpO2 100%   BMI 19.12 kg/m   Pertinent Labs, Studies, and Procedures:  Nuclear study reviewed as intermediate risk. Would benefit from potential cardiac catheterization at a future time.  Discharge Instructions:  left AGAINST MEDICAL ADVICE  Signed: Thomasene LotJames Jahquez Steffler, MD 07/06/2016, 10:54 AM   Pager: [phone]

## 2016-07-07 ENCOUNTER — Emergency Department (HOSPITAL_COMMUNITY): Payer: Medicaid Other

## 2016-07-07 ENCOUNTER — Telehealth: Payer: Self-pay | Admitting: Cardiology

## 2016-07-07 ENCOUNTER — Inpatient Hospital Stay (HOSPITAL_COMMUNITY)
Admission: EM | Admit: 2016-07-07 | Discharge: 2016-07-09 | DRG: 287 | Disposition: A | Discharge status: Home / Self Care | Payer: Medicaid Other | Attending: Internal Medicine | Admitting: Internal Medicine

## 2016-07-07 ENCOUNTER — Encounter (HOSPITAL_COMMUNITY): Payer: Self-pay | Admitting: Emergency Medicine

## 2016-07-07 DIAGNOSIS — Z590 Homelessness: Secondary | ICD-10-CM

## 2016-07-07 DIAGNOSIS — F200 Paranoid schizophrenia: Secondary | ICD-10-CM | POA: Diagnosis present

## 2016-07-07 DIAGNOSIS — Z9114 Patient's other noncompliance with medication regimen: Secondary | ICD-10-CM | POA: Diagnosis not present

## 2016-07-07 DIAGNOSIS — N179 Acute kidney failure, unspecified: Secondary | ICD-10-CM

## 2016-07-07 DIAGNOSIS — R079 Chest pain, unspecified: Secondary | ICD-10-CM | POA: Diagnosis present

## 2016-07-07 DIAGNOSIS — Z9119 Patient's noncompliance with other medical treatment and regimen: Secondary | ICD-10-CM

## 2016-07-07 DIAGNOSIS — Z8673 Personal history of transient ischemic attack (TIA), and cerebral infarction without residual deficits: Secondary | ICD-10-CM | POA: Diagnosis not present

## 2016-07-07 DIAGNOSIS — I10 Essential (primary) hypertension: Secondary | ICD-10-CM | POA: Diagnosis not present

## 2016-07-07 DIAGNOSIS — Z886 Allergy status to analgesic agent status: Secondary | ICD-10-CM

## 2016-07-07 DIAGNOSIS — F329 Major depressive disorder, single episode, unspecified: Secondary | ICD-10-CM | POA: Diagnosis present

## 2016-07-07 DIAGNOSIS — Z7951 Long term (current) use of inhaled steroids: Secondary | ICD-10-CM

## 2016-07-07 DIAGNOSIS — Z598 Other problems related to housing and economic circumstances: Secondary | ICD-10-CM | POA: Diagnosis not present

## 2016-07-07 DIAGNOSIS — F17210 Nicotine dependence, cigarettes, uncomplicated: Secondary | ICD-10-CM | POA: Diagnosis not present

## 2016-07-07 DIAGNOSIS — Z79899 Other long term (current) drug therapy: Secondary | ICD-10-CM

## 2016-07-07 DIAGNOSIS — J45909 Unspecified asthma, uncomplicated: Secondary | ICD-10-CM | POA: Diagnosis present

## 2016-07-07 DIAGNOSIS — Z955 Presence of coronary angioplasty implant and graft: Secondary | ICD-10-CM

## 2016-07-07 DIAGNOSIS — F1420 Cocaine dependence, uncomplicated: Secondary | ICD-10-CM

## 2016-07-07 DIAGNOSIS — Z981 Arthrodesis status: Secondary | ICD-10-CM | POA: Diagnosis not present

## 2016-07-07 DIAGNOSIS — Z681 Body mass index (BMI) 19 or less, adult: Secondary | ICD-10-CM

## 2016-07-07 DIAGNOSIS — Z86711 Personal history of pulmonary embolism: Secondary | ICD-10-CM | POA: Diagnosis not present

## 2016-07-07 DIAGNOSIS — Z8249 Family history of ischemic heart disease and other diseases of the circulatory system: Secondary | ICD-10-CM

## 2016-07-07 DIAGNOSIS — I2510 Atherosclerotic heart disease of native coronary artery without angina pectoris: Secondary | ICD-10-CM

## 2016-07-07 DIAGNOSIS — G4733 Obstructive sleep apnea (adult) (pediatric): Secondary | ICD-10-CM | POA: Diagnosis present

## 2016-07-07 DIAGNOSIS — E440 Moderate protein-calorie malnutrition: Secondary | ICD-10-CM | POA: Diagnosis present

## 2016-07-07 DIAGNOSIS — Z7982 Long term (current) use of aspirin: Secondary | ICD-10-CM | POA: Diagnosis not present

## 2016-07-07 DIAGNOSIS — Z9861 Coronary angioplasty status: Secondary | ICD-10-CM

## 2016-07-07 DIAGNOSIS — F1410 Cocaine abuse, uncomplicated: Secondary | ICD-10-CM | POA: Diagnosis present

## 2016-07-07 DIAGNOSIS — Z720 Tobacco use: Secondary | ICD-10-CM | POA: Diagnosis present

## 2016-07-07 DIAGNOSIS — Z823 Family history of stroke: Secondary | ICD-10-CM

## 2016-07-07 LAB — BASIC METABOLIC PANEL
Anion gap: 7 (ref 5–15)
BUN: 15 mg/dL (ref 6–20)
CO2: 27 mmol/L (ref 22–32)
Calcium: 9.5 mg/dL (ref 8.9–10.3)
Chloride: 107 mmol/L (ref 101–111)
Creatinine, Ser: 1.5 mg/dL — ABNORMAL HIGH (ref 0.61–1.24)
GFR, EST AFRICAN AMERICAN: 60 mL/min — AB (ref >60)
GFR, EST NON AFRICAN AMERICAN: 51 mL/min — AB (ref >60)
Glucose, Bld: 91 mg/dL (ref 65–99)
POTASSIUM: 4.3 mmol/L (ref 3.5–5.1)
SODIUM: 141 mmol/L (ref 135–145)

## 2016-07-07 LAB — CBC
HEMATOCRIT: 44.1 % (ref 39.0–52.0)
Hemoglobin: 15.3 g/dL (ref 13.0–17.0)
MCH: 31.7 pg (ref 26.0–34.0)
MCHC: 34.7 g/dL (ref 30.0–36.0)
MCV: 91.5 fL (ref 78.0–100.0)
PLATELETS: 260 10*3/uL (ref 150–400)
RBC: 4.82 MIL/uL (ref 4.22–5.81)
RDW: 13.5 % (ref 11.5–15.5)
WBC: 6.3 10*3/uL (ref 4.0–10.5)

## 2016-07-07 LAB — I-STAT TROPONIN, ED: Troponin i, poc: 0 ng/mL (ref 0.00–0.08)

## 2016-07-07 SURGERY — LEFT HEART CATH AND CORONARY ANGIOGRAPHY
Anesthesia: LOCAL

## 2016-07-07 MED ORDER — ATORVASTATIN CALCIUM 80 MG PO TABS
80.0000 mg | ORAL_TABLET | Freq: Every day | ORAL | Status: DC
  Administered 2016-07-08 (×2): 80 mg via ORAL
  Filled 2016-07-07 (×2): qty 1

## 2016-07-07 MED ORDER — ACETAMINOPHEN 325 MG PO TABS
650.0000 mg | ORAL_TABLET | ORAL | Status: DC | PRN
  Administered 2016-07-08: 650 mg via ORAL
  Filled 2016-07-07: qty 2

## 2016-07-07 MED ORDER — MORPHINE SULFATE (PF) 4 MG/ML IV SOLN: 2.0000 mg | INTRAVENOUS | Status: DC | PRN

## 2016-07-07 MED ORDER — MOMETASONE FURO-FORMOTEROL FUM 100-5 MCG/ACT IN AERO
2.0000 | INHALATION_SPRAY | Freq: Two times a day (BID) | RESPIRATORY_TRACT | Status: DC
  Administered 2016-07-08 – 2016-07-09 (×3): 2 via RESPIRATORY_TRACT
  Filled 2016-07-07: qty 8.8

## 2016-07-07 MED ORDER — ENOXAPARIN SODIUM 40 MG/0.4ML ~~LOC~~ SOLN
40.0000 mg | Freq: Every day | SUBCUTANEOUS | Status: DC
  Administered 2016-07-08 (×2): 40 mg via SUBCUTANEOUS
  Filled 2016-07-07 (×2): qty 0.4

## 2016-07-07 MED ORDER — ASPIRIN EC 81 MG PO TBEC
81.0000 mg | DELAYED_RELEASE_TABLET | Freq: Every day | ORAL | Status: DC
  Administered 2016-07-08 – 2016-07-09 (×2): 81 mg via ORAL
  Filled 2016-07-07 (×2): qty 1

## 2016-07-07 MED ORDER — ONDANSETRON HCL 4 MG/2ML IJ SOLN: 4.0000 mg | Freq: Four times a day (QID) | INTRAMUSCULAR | Status: DC | PRN

## 2016-07-07 NOTE — Telephone Encounter (Signed)
New Message  Pt call requesting to speak with RN. Pt states he was to have a procedure complete with the cath lab, but pt did not. Pt states he is on his way back into town and is having some issues with SOB. Pt states he is feeling really bad and would like to get the cath procedure completed. Pt states once he returns he will be going to the hospital. Please call back to discuss

## 2016-07-07 NOTE — ED Notes (Signed)
Pt given 2 sandwiches and applesauce.

## 2016-07-07 NOTE — ED Triage Notes (Signed)
Pt. Stated. I just checked out of the hospital due to I got into with a nurse upstairs cause I was suppose to have a cardiac cath. So Im still having pain.

## 2016-07-07 NOTE — Telephone Encounter (Signed)
Spoke with pt he states that he was at the hospital 07-04-16 and left against medical advice before having left heart cath. He states that he was arguing with his nurse and decided to leave because they were arguing. Pt states that he is currently having chest pain and has been having chest pain since he left 07-04-16. I informed pt to go back to the ER ASAP because he needs to have left heart cath procedure done and have chest pain evaluated, offered to call [phone] pt states that he will go ASAP.

## 2016-07-07 NOTE — H&P (Signed)
Date: 07/08/2016               Patient Name:  Timothy Peck MRN: [id number]  DOB: 1960-10-04 Age / Sex: 55 y.o., male   PCP: Doris Cheadle, MD         Medical Service: Internal Medicine Teaching Service         Attending Physician: Dr. Gust Rung, DO    First Contact: Dr. Ladona Ridgel  Pager: [phone]  Second Contact: Dr. Loney Loh  Pager: [phone]       After Hours (After 5p/  First Contact Pager: [phone]  weekends / holidays): Second Contact Pager: [phone]   Chief Complaint: chest pain   History of Present Illness: Timothy Peck is a 55 y.o. male with a PMH of CAD (s/p stent proximal LAD 2013), PE, CVA, asthma, and cocaine and tobacco abuse who presents with chest pain. His chest pain began one week ago, it is sharp and substernal and radiates around to his left side. The pain is accompanied by a pressure "it feels like someone's sitting on me." Pain is associated with shortness of breath, cough, sweating, and palpitations. The pain is constant but becomes worse with exertion and resolves only partially with rest. He has two pillow orthopnea and occasionally experiences paroxysmal nocturnal dyspnea. He denies leg swelling.  He was previously hospitalized 11/29-12/1 for the same chest pain and shortness of breath. He was admitted for cardiac evaluation and had a nuclear stress test which was intermediate risk. He was scheduled for cardiac catheterization but left against medical advise before the procedure was performed.  Of note he has a history of PE, DVT, and CVA. He was previously on coumadin but could no longer afford it and stopped taking it a month ago.   Meds:   (Not in a hospital admission)  Allergies: Allergies as of 07/07/2016 - Review Complete 07/02/2016  Allergen Reaction Noted  . Ibuprofen Other (See Comments) 02/21/2012   Past Medical History:  Diagnosis Date  . Asthma   . Bilateral pulmonary embolism [facility]) August 2014  . CAD S/P percutaneous coronary  angioplasty 02/2012   NSTEMI - PCI to prox LAD  . Chronic anticoagulation December 2014   Switch from Xarelto to warfarin.  . Degenerative disc disease, cervical    Dx years ago  . Depression   . 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 Hypertension   . NSTEMI (non-ST elevated myocardial infarction) (HCC)    07/18/13, stable cath with patent stent; preserved EF by echo; follow-up Echo for CVA 09/2013: EF 60%, mild LVH. No RDW and a. No cardiac source for embolism noted. Bubble study was not performed  . NSTEMI (non-ST elevated myocardial infarction), 07/18/13, stable cath with patent stent. 01/31/2012  . OSA (obstructive sleep apnea)    Diagnosis long ago, used to use CPAP, not currently on any treatment.  . Polysubstance abuse    Past history of cocaine and marijuana, both smoking. Also long standing tobacco abuse.   . Presence of drug coated stent in LAD coronary artery 02/2012   Promus Element DES 4.0 mm x 28 mm; converted from Brilinta to aspirin during stroke evaluation in February 2015   Family History:  Father died of MI at age 39  Mother died of hemorrhagic stroke at age 7   Social History:  He has been smoking since he was 55 years old, smoked 2-3 PPD at times, currently he smokes 1/2 PPD. He drinks one  16 ounce beer every other week. He uses cocaine frequently, last used  12/3.   Review of Systems: A complete ROS was negative except as per HPI.   Physical Exam: Vitals:   07/07/16 2215 07/07/16 2230 07/07/16 2245 07/07/16 2330  BP: 133/87 101/61 102/68 113/74  Pulse: 77 79 82 68  Resp: 22 17 20 20   Temp:      SpO2: 100% 99% 100% 98%  Weight:      Height:       Physical Exam  Constitutional: He appears well-developed and well-nourished. No distress.  HENT:  Mouth/Throat: No oropharyngeal exudate.  Poor dentition   Eyes: Conjunctivae are normal. No scleral icterus.  Cardiovascular: Normal rate and regular rhythm.   No murmur heard. Pulmonary/Chest: Effort  normal. No respiratory distress. He has no wheezes. He has no rales. He exhibits tenderness.  Abdominal: Soft. He exhibits no distension. There is tenderness. There is no guarding.  Skin: No rash noted. He is not diaphoretic.  Psychiatric: His mood appears anxious.   Labs: CBC:  Recent Labs Lab 07/02/16 0941 07/07/16 1816  WBC 9.2 6.3  HGB 14.4 15.3  HCT 40.2 44.1  MCV 90.1 91.5  PLT 247 260   Basic Metabolic Panel:  Recent Labs Lab 07/02/16 0941 07/03/16 0245 07/07/16 1816  NA 140 139 141  K 3.9 3.6 4.3  CL 107 107 107  CO2 24 25 27   GLUCOSE 105* 87 91  BUN 19 21* 15  CREATININE 1.28* 1.38* 1.50*  CALCIUM 9.2 8.5* 9.5   Cardiac Enzymes:  Recent Labs Lab 07/02/16 0958 07/02/16 1554 07/02/16 1605 07/02/16 2046 07/03/16 0245 07/07/16 1850  TROPONINI  --  <0.03  --  <0.03 <0.03  --   TROPIPOC 0.00  --  0.00  --   --  0.00   CBG: Lab Results  Component Value Date   HGBA1C 5.4 03/10/2016   EKG: EKG: normal sinus rhythm, nonspecific ST and T waves changes, Q waves in II, III, aVF, V4, V5,V6  .  Imaging: Chest xray  Personal review of the imaging reveals no acute cardiopulmonary disease  Assessment & Plan by Problem: Principal Problem:   Chest pain with moderate risk for cardiac etiology Active Problems:   Tobacco abuse   CAD S/P percutaneous coronary angioplasty 02/21/12 non-STEMI -Promus DES 4.0 mm 20 mm to prox LAD   History of arterial ischemic stroke   Malnutrition of moderate degree (HCC)   Cocaine abuse   Essential hypertension  Coronary artery disease (s/p stent 2013)  Timothy Peck is a 55 y.o. male with PMH CAD (s/p stent proximal LAD 2013), PE, CVA, asthma, and cocaine and tobacco abuse who presents with chest pain. He was hospitalized for the same chest pain last week and had a nuclear stress test that was intermediate risk and had been scheduled for cardiac catheterization but left AMA. His chest wall tenderness and negative troponins  are somewhat reassuring. He has numerous risk factors (hx of CAD, DM, HTN, tobacco and cocaine use) and intermediate risk stress test it is concerning that this could be ischemic heart disease. Heart score of 4 is moderate risk (moderately suspicious history, >3 risk factors, age) . Home medications include atorvastatin 80 mg daily, aspirin 81 mg daily however he has not been taking any of these medications because he is unable to afford them.  NPO at midnight for possible cardiac cath in the morning  Trending troponin  Ordered atorvastatin 80 mg daily  Ordered  aspirin 81 mg daily  Ordered IV Morphine 2 mg q2h PRN  Cardiac monitoring   Acute kidney injury  Crt 1.5 on admission with baseline 1.3. BUN to creatinine ratio is 10:1 which is less likely pre renal.  Follow up morning BMET   History of pulmonary embolism  He has a history of PE, DVT, and CVA. He was previously on coumadin but couldn't afford this medication and stopped taking it about a month ago.  Coumadin per pharmacology protocol after cath procedure   Hypertension  Currently he is normotensive but has a history of HTN, was on lisinopril 5 mg daily in the past.   Asthma  Ordered home medications Dulera BID.   Tobacco abuse  He has >40 pack year history and expresses the desire to quit smoking. He ask for help with access to nicotine patches.  Ordered nicotine patch  Cocaine abuse  Counseled on the importance of quitting cocaine use.   Social constraints  He is unable to afford his medications and will need assistance at the time of discharge.  Case management consult   DVT Ppx Lovenox 40 mg daily  Code Status FULL   Dispo: Admit patient to Inpatient with expected length of stay greater than 2 midnights.  Signed: Eulah PontNina Thy Gullikson, MD 07/08/2016, 12:10 AM  Pager: [phone]

## 2016-07-07 NOTE — ED Provider Notes (Signed)
[facility]-EMERGENCY DEPT Provider Note   CSN: [id number] Arrival date & time: 07/07/16  1757     History   Chief Complaint Chief Complaint  Patient presents with  . Chest Pain    HPI Timothy Peck is a 55 y.o. male.  HPI   Cardiologist Dr. Herbie BaltimoreHarding  55 year old male with a past medical history hyperlipidemia, CAD, tobacco abuse, substance abuse, noncompliance presents after leaving AGAINST MEDICAL ADVICE yesterday. Patient has a history of PCI and is LAD after MI in 2013, also had repeat catheterizations in May 2000 07/17/2013. Patient has intermittent chest pain for the last week, feels pressure on his chest, no radiation of symptoms, not exertional.  Patient reports he was scheduled for a cardiac cath today, but left the hospital as he had a stressful interaction with staff member. He notes on [facility] who informed him to return me lead to the emergency room for hospital admission.  Patient is classified as intermediate risk based on myocardial perfusion scan   Past Medical History:  Diagnosis Date  . Asthma   . Bilateral pulmonary embolism [facility](HCC) August 2014  . CAD S/P percutaneous coronary angioplasty 02/2012   NSTEMI - PCI to prox LAD  . Chronic anticoagulation December 2014   Switch from Xarelto to warfarin.  . Degenerative disc disease, cervical    Dx years ago  . Depression   . 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 Hypertension   . NSTEMI (non-ST elevated myocardial infarction) (HCC)    07/18/13, stable cath with patent stent; preserved EF by echo; follow-up Echo for CVA 09/2013: EF 60%, mild LVH. No RDW and a. No cardiac source for embolism noted. Bubble study was not performed  . NSTEMI (non-ST elevated myocardial infarction), 07/18/13, stable cath with patent stent. 01/31/2012  . OSA (obstructive sleep apnea)    Diagnosis long ago, used to use CPAP, not currently on any treatment.  . Polysubstance abuse    Past history of cocaine and marijuana, both  smoking. Also long standing tobacco abuse.   . Presence of drug coated stent in LAD coronary artery 02/2012   Promus Element DES 4.0 mm x 28 mm; converted from Brilinta to aspirin during stroke evaluation in February 2015    Patient Active Problem List   Diagnosis Date Noted  . Chest pain 07/02/2016  . Essential hypertension 04/21/2016  . Cocaine abuse   . Left-sided weakness   . Shortness of breath   . Chest pain with moderate risk for cardiac etiology 10/04/2014  . Malnutrition of moderate degree (HCC) 09/22/2014  . Noncompliance with medications 09/21/2014  . History of arterial ischemic stroke 09/20/2014  . 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 Long term current use of anticoagulant therapy 10/11/2013  . Acute ischemic stroke (HCC) 09/21/2013  . Chronic anticoagulation, since 03/2013 for PE 07/19/2013  . Chest pain, musculoskeletal 07/18/2013  . Pulmonary embolism and infarction, 03/13/13, No longeron warfarin 03/14/2013  . Acute respiratory failure with hypoxia (HCC) 03/14/2013  . Blood in stool, resolved 01/11/2013  . CAD S/P percutaneous coronary angioplasty 02/21/12 non-STEMI -Promus DES 4.0 mm 20 mm to prox LAD 12/14/2012    Class: Diagnosis of  . Dyslipidemia, goal LDL below 70 12/14/2012  . Bradycardia 02/03/2012  . NSTEMI (non-ST elevated myocardial infarction), 07/18/13, stable cath with patent stent. 01/31/2012  . Substance abuse 01/31/2012  . Alcohol abuse 01/31/2012  . Tobacco abuse 01/31/2012  . Herniation of cervical intervertebral disc  with radiculopathy 11/28/2011    Past Surgical History:  Procedure Laterality Date  . ANTERIOR CERVICAL DECOMP/DISCECTOMY FUSION  11/28/2011   Procedure: ANTERIOR CERVICAL DECOMPRESSION/DISCECTOMY FUSION 1 LEVEL;  Surgeon: Temple Pacini, MD;  Location: [facility];  Service: Neurosurgery;  Laterality: N/A;  Anterior Cervical Six-Seven Decompression and Fusion  . LEFT HEART  CATHETERIZATION WITH CORONARY ANGIOGRAM N/A 01/31/2012   Procedure: LEFT HEART CATHETERIZATION WITH CORONARY ANGIOGRAM;  Surgeon: Runell Gess, MD;  Location: [facility] CATH LAB;  Service: Cardiovascular: NSTEMI -  p-mLAD ~80% thrombotic lesion -> treated with [id number] Inhibitor & staged PCI  . LEFT HEART CATHETERIZATION WITH CORONARY ANGIOGRAM N/A 12/17/2012   Procedure: LEFT HEART CATHETERIZATION WITH CORONARY ANGIOGRAM;  Surgeon: Marykay Lex, MD;  Location: [facility] C[facility] LAB;  Service: Cardiovascular: Patent LAD DES.  CP thought to be Non-anginal  . LEFT HEART CATHETERIZATION WITH CORONARY ANGIOGRAM N/A 07/18/2013   Procedure: LEFT HEART CATHETERIZATION WITH CORONARY ANGIOGRAM;  Surgeon: Marykay Lex, MD;  Location: [facility] CATH LAB;  Service: Cardiovascular: patent stent, stable CAD.  ? Sx related to Myopericarditis.  Marland Kitchen PERCUTANEOUS CORONARY STENT INTERVENTION (PCI-S) N/A 02/02/2012   Procedure: PERCUTANEOUS CORONARY STENT INTERVENTION (PCI-S);  Surgeon: Marykay Lex, MD;  Location: [facility] CATH LAB;  Service: Cardiovascular: Staged LAD PCI - Promus DES 4x28.    . TUMOR REMOVAL     left foot third toe       Home Medications    Prior to Admission medications   Medication Sig Start Date End Date Taking? Authorizing Provider  aspirin EC 81 MG tablet Take 1 tablet (81 mg total) by mouth daily. Patient not taking: Reported on 07/02/2016 04/21/16   Marykay Lex, MD  atorvastatin (LIPITOR) 80 MG tablet Take 1 tablet (80 mg total) by mouth at bedtime. Patient not taking: Reported on 07/02/2016 04/21/16   Marykay Lex, MD  isosorbide dinitrate (ISORDIL) 20 MG tablet Take 1 tablet (20 mg total) by mouth 2 (two) times daily. Patient not taking: Reported on 07/02/2016 04/21/16   Marykay Lex, MD  lisinopril (PRINIVIL,ZESTRIL) 5 MG tablet Take 1 tablet (5 mg total) by mouth daily. Patient not taking: Reported on 07/02/2016 04/21/16   Marykay Lex, MD  mometasone-formoterol [facility]) 100-5 MCG/ACT AERO  Inhale 2 puffs into the lungs 2 (two) times daily. Patient not taking: Reported on 07/02/2016 04/21/16   Marykay Lex, MD    Family History Family History  Problem Relation Age of Onset  . Coronary artery disease Father     died of MI  . Stroke Father 14  . Heart disease Father   . Diabetes Father   . Aneurysm Mother 45    intracranial aneurysm rupture  . Hypertension Mother   . Hypertension Maternal Grandmother   . Diabetes Maternal Grandmother   . Hypertension Paternal Grandmother   . Hypertension Paternal Grandfather   . Cancer Maternal Aunt   . Anesthesia problems Neg Hx   . Hypotension Neg Hx   . Malignant hyperthermia Neg Hx   . Pseudochol deficiency Neg Hx     Social History Social History  Substance Use Topics  . Smoking status: Current Every Day Smoker    Packs/day: 1.00    Years: 42.00    Types: Cigarettes    Start date: 01/12/1971  . Smokeless tobacco: Never Used  . Alcohol use No     Comment: Report being abstinent since ~January 2015     Allergies   Ibuprofen  Review of Systems Review of Systems   Physical Exam Updated Vital Signs BP 113/74   Pulse 68   Temp 97.7 F (36.5 C)   Resp 20   Ht 6' 4.5" (1.943 m)   Wt 71.2 kg   SpO2 98%   BMI 18.86 kg/m   Physical Exam  Cardiovascular: Normal rate, regular rhythm, normal heart sounds and intact distal pulses.   Pulmonary/Chest: Effort normal and breath sounds normal.  Abdominal: Soft.  Musculoskeletal: Normal range of motion. He exhibits no edema.  Nursing note and vitals reviewed.    ED Treatments / Results  Labs (all labs ordered are listed, but only abnormal results are displayed) Labs Reviewed  BASIC METABOLIC PANEL - Abnormal; Notable for the following:       Result Value   Creatinine, Ser 1.50 (*)    GFR calc non Af Amer 51 (*)    GFR calc Af Amer 60 (*)    All other components within normal limits  CBC  CBC  CREATININE, SERUM  TROPONIN I  TROPONIN I  TROPONIN I    I-STAT TROPOININ, ED    EKG  EKG Interpretation  Date/Time:  Monday July 07 2016 18:03:46 EST Ventricular Rate:  58 PR Interval:  150 QRS Duration: 82 QT Interval:  406 QTC Calculation: 398 R Axis:   91 Text Interpretation:  Sinus bradycardia Right atrial enlargement Rightward axis Nonspecific ST abnormality Abnormal ECG No significant change since last tracing Confirmed by Kandis MannanMACKUEN, COURTNEY ([id number]) on 07/07/2016 6:09:46 PM       Radiology Dg Chest 2 View  Result Date: 07/07/2016 CLINICAL DATA:  Chest pain since 07/04/2016. EXAM: CHEST  2 VIEW COMPARISON:  CT chest and PA and lateral chest 07/02/2016. FINDINGS: Lungs are clear. Heart size is normal. No pneumothorax or pleural effusion. No focal bony abnormality. IMPRESSION: No acute disease. Electronically Signed   By: Drusilla Kannerhomas  Dalessio M.D.   On: 07/07/2016 20:24    Procedures Procedures (including critical care time)  Medications Ordered in ED Medications  acetaminophen (TYLENOL) tablet 650 mg (not administered)  ondansetron (ZOFRAN) injection 4 mg (not administered)  enoxaparin (LOVENOX) injection 40 mg (not administered)  morphine 4 MG/ML injection 2 mg (not administered)  aspirin EC tablet 81 mg (not administered)  mometasone-formoterol (DULERA) 100-5 MCG/ACT inhaler 2 puff (not administered)  atorvastatin (LIPITOR) tablet 80 mg (not administered)     Initial Impression / Assessment and Plan / ED Course  I have reviewed the triage vital signs and the nursing notes.  Pertinent labs & imaging results that were available during my care of the patient were reviewed by me and considered in my medical decision making (see chart for details).  Clinical Course      Final Clinical Impressions(s) / ED Diagnoses   Final diagnoses:  Chest pain, unspecified type    Labs: I-STAT troponin, BMP, CBC  Imaging: Chest 2 view  Consults:  Therapeutics:  Discharge Meds:   Assessment/Plan:  55 year old male  presents today with chest pain. Patient left AMA yesterday, he was scheduled for cardiac cath today. Patient continues to have chest pain, internal medicine teaching service consult for readmission and further cardiac workup. Patient troponin here normal, no significant EKG changes, no significant head pain or symptoms since onset.      New Prescriptions New Prescriptions   No medications on file     Eyvonne MechanicJeffrey Kelsen Celona, PA-C 07/07/16 2357    Charlynne Panderavid Hsienta Yao, MD 07/08/16 [phone]

## 2016-07-08 ENCOUNTER — Encounter (HOSPITAL_COMMUNITY)
Admission: EM | Disposition: A | Discharge status: Home / Self Care | Payer: Self-pay | Source: Home / Self Care | Attending: Internal Medicine

## 2016-07-08 ENCOUNTER — Inpatient Hospital Stay (HOSPITAL_COMMUNITY): Admission: RE | Admit: 2016-07-08 | Payer: Medicaid Other | Source: Ambulatory Visit

## 2016-07-08 ENCOUNTER — Encounter (HOSPITAL_COMMUNITY): Payer: Self-pay

## 2016-07-08 DIAGNOSIS — R079 Chest pain, unspecified: Secondary | ICD-10-CM

## 2016-07-08 DIAGNOSIS — I2510 Atherosclerotic heart disease of native coronary artery without angina pectoris: Principal | ICD-10-CM

## 2016-07-08 HISTORY — PX: CARDIAC CATHETERIZATION: SHX172

## 2016-07-08 LAB — BASIC METABOLIC PANEL
Anion gap: 4 — ABNORMAL LOW (ref 5–15)
BUN: 16 mg/dL (ref 6–20)
CHLORIDE: 110 mmol/L (ref 101–111)
CO2: 27 mmol/L (ref 22–32)
CREATININE: 1.32 mg/dL — AB (ref 0.61–1.24)
Calcium: 8.8 mg/dL — ABNORMAL LOW (ref 8.9–10.3)
GFR calc Af Amer: >60 mL/min (ref >60)
Glucose, Bld: 100 mg/dL — ABNORMAL HIGH (ref 65–99)
Potassium: 4.1 mmol/L (ref 3.5–5.1)
SODIUM: 141 mmol/L (ref 135–145)

## 2016-07-08 LAB — TROPONIN I
Troponin I: <0.03 ng/mL (ref <0.03)
Troponin I: <0.03 ng/mL (ref <0.03)

## 2016-07-08 SURGERY — LEFT HEART CATH AND CORONARY ANGIOGRAPHY
Anesthesia: LOCAL

## 2016-07-08 MED ORDER — SODIUM CHLORIDE 0.9% FLUSH: 3.0000 mL | Freq: Two times a day (BID) | INTRAVENOUS | Status: DC

## 2016-07-08 MED ORDER — SODIUM CHLORIDE 0.9% FLUSH: 3.0000 mL | INTRAVENOUS | Status: DC | PRN

## 2016-07-08 MED ORDER — MIDAZOLAM HCL 2 MG/2ML IJ SOLN
INTRAMUSCULAR | Status: DC | PRN
  Administered 2016-07-08: 2 mg via INTRAVENOUS

## 2016-07-08 MED ORDER — SODIUM CHLORIDE 0.9% FLUSH
3.0000 mL | Freq: Two times a day (BID) | INTRAVENOUS | Status: DC
  Administered 2016-07-09: 3 mL via INTRAVENOUS

## 2016-07-08 MED ORDER — MIDAZOLAM HCL 2 MG/2ML IJ SOLN
INTRAMUSCULAR | Status: AC
  Filled 2016-07-08: qty 2

## 2016-07-08 MED ORDER — IOPAMIDOL (ISOVUE-370) INJECTION 76%
INTRAVENOUS | Status: AC
  Filled 2016-07-08: qty 100

## 2016-07-08 MED ORDER — FENTANYL CITRATE (PF) 100 MCG/2ML IJ SOLN
INTRAMUSCULAR | Status: AC
  Filled 2016-07-08: qty 2

## 2016-07-08 MED ORDER — SODIUM CHLORIDE 0.9 % WEIGHT BASED INFUSION
1.0000 mL/kg/h | INTRAVENOUS | Status: AC
  Administered 2016-07-08: 1 mL/kg/h via INTRAVENOUS

## 2016-07-08 MED ORDER — SODIUM CHLORIDE 0.9 % IV SOLN
INTRAVENOUS | Status: DC
  Administered 2016-07-08: 1 mL via INTRAVENOUS

## 2016-07-08 MED ORDER — NICOTINE 7 MG/24HR TD PT24
7.0000 mg | MEDICATED_PATCH | Freq: Every day | TRANSDERMAL | Status: DC
  Administered 2016-07-08 – 2016-07-09 (×2): 7 mg via TRANSDERMAL
  Filled 2016-07-08 (×2): qty 1

## 2016-07-08 MED ORDER — HEPARIN SODIUM (PORCINE) 1000 UNIT/ML IJ SOLN
INTRAMUSCULAR | Status: DC | PRN
  Administered 2016-07-08: 3000 [IU] via INTRAVENOUS

## 2016-07-08 MED ORDER — WARFARIN - PHARMACIST DOSING INPATIENT: Freq: Every day | Status: DC

## 2016-07-08 MED ORDER — HEPARIN (PORCINE) IN NACL 2-0.9 UNIT/ML-% IJ SOLN
INTRAMUSCULAR | Status: DC | PRN
  Administered 2016-07-08: 1000 mL

## 2016-07-08 MED ORDER — ASPIRIN 81 MG PO CHEW: 81.0000 mg | CHEWABLE_TABLET | ORAL | Status: DC

## 2016-07-08 MED ORDER — VERAPAMIL HCL 2.5 MG/ML IV SOLN
INTRAVENOUS | Status: AC
  Filled 2016-07-08: qty 2

## 2016-07-08 MED ORDER — WARFARIN SODIUM 10 MG PO TABS
10.0000 mg | ORAL_TABLET | Freq: Once | ORAL | Status: AC
  Administered 2016-07-08: 10 mg via ORAL
  Filled 2016-07-08: qty 1

## 2016-07-08 MED ORDER — SODIUM CHLORIDE 0.9 % IV SOLN: 250.0000 mL | INTRAVENOUS | Status: DC | PRN

## 2016-07-08 MED ORDER — HEPARIN SODIUM (PORCINE) 1000 UNIT/ML IJ SOLN
INTRAMUSCULAR | Status: AC
  Filled 2016-07-08: qty 1

## 2016-07-08 MED ORDER — HEPARIN (PORCINE) IN NACL 2-0.9 UNIT/ML-% IJ SOLN
INTRAMUSCULAR | Status: AC
  Filled 2016-07-08: qty 1000

## 2016-07-08 MED ORDER — LIDOCAINE HCL (PF) 1 % IJ SOLN
INTRAMUSCULAR | Status: AC
  Filled 2016-07-08: qty 30

## 2016-07-08 MED ORDER — FENTANYL CITRATE (PF) 100 MCG/2ML IJ SOLN
INTRAMUSCULAR | Status: DC | PRN
  Administered 2016-07-08: 25 ug via INTRAVENOUS

## 2016-07-08 SURGICAL SUPPLY — 10 items
CATH EXPO 5F FL3.5 (CATHETERS) ×2 IMPLANT
CATH INFINITI JR4 5F (CATHETERS) ×2 IMPLANT
DEVICE RAD COMP TR BAND LRG (VASCULAR PRODUCTS) ×2 IMPLANT
GLIDESHEATH SLEND A-KIT 6F 22G (SHEATH) ×2 IMPLANT
GUIDEWIRE INQWIRE 1.5J.035X260 (WIRE) ×1 IMPLANT
INQWIRE 1.5J .035X260CM (WIRE) ×2
KIT HEART LEFT (KITS) ×2 IMPLANT
PACK CARDIAC CATHETERIZATION (CUSTOM PROCEDURE TRAY) ×2 IMPLANT
TRANSDUCER W/STOPCOCK (MISCELLANEOUS) ×2 IMPLANT
TUBING CIL FLEX 10 FLL-RA (TUBING) ×2 IMPLANT

## 2016-07-08 NOTE — Progress Notes (Signed)
Pt transported to cath lab. Pt's belongings left in pt's room, per pt request. Telemetry box removed.   Berdine DanceLauren Moffitt BSN, RN

## 2016-07-08 NOTE — Progress Notes (Signed)
Patient lying in bed. No bleeding at cath site, still level 1. No other needs at this time. Call light within reach

## 2016-07-08 NOTE — Progress Notes (Signed)
TR band removed from pt's right wrist. Site is level 0. Gauze and tegaderm dressing applied.   Berdine DanceLauren Moffitt BSN, RN

## 2016-07-08 NOTE — H&P (View-Only) (Signed)
  Cardiology Consult    Patient ID: Timothy Peck MRN: [id number], DOB/AGE: 53/08/1960   Admit date: 07/07/2016 Date of Consult: 07/08/2016  Primary Physician: Deepak Advani, MD Reason for Consult: Chest pain Primary Cardiologist: Dr. Harding Requesting Provider: Dr. Blum   Patient Profile    Timothy Peck is a 55 year old male with a past medical history of NSTEMI in June 2013 with DES placed to prox LAD, NSTEMI in Dec. 2014 with patent LAD stent, ongoing cocaine and tobacco abuse, severe paranoid schizophrenia, bilateral PE (not on any anticoagulation), and CVA. He presents with chest pain.   History of Present Illness    Timothy Peck was recently hospitalized from 11/29-12/1 for chest pain and was seen in consultation by Cardiology. A Lexiscan Myoview was ordered and he had a small to moderate sized defect within the mid and apical segments of the inferolateral wall. He was scheduled for a heart cath on 07/07/16 but unfortunately left AMA on 07/04/16.   He tells me that he has had chest pain for the past 3-4 days that occurs with minimal activity. It is a pressure - like sensation. It feels like someone is sitting on the left side of his chest. He denies associated symptoms of SOB, diaphoresis and nausea.   He used cocaine on Friday 07/04/16 and had more intense chest pain after using. He is very paranoid, says that he currently is having auditory hallucinations.   He is homeless, and is fixated on getting to [place] to a shelter. He says that he is compliant with his medications. He does have medicaid and we talked about if he gets a stent he will have to adhere to anti-platelet therapy. He is agreeable.   He last saw Dr. Harding in September of this year. He reported exertional angina to Dr. Harding who recommended a stress test, but he did not appear for the outpatient stress test.   His last heart cath was in Dec. 2014 in the setting of NSTEMI, his proximal LAD stent (placed the year  prior) was widely patent. He was treated for myo-pericarditis. 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   Past Medical History   Past Medical History:  Diagnosis Date  . Asthma   . Bilateral pulmonary embolism (HCC) August 2014  . CAD S/P percutaneous coronary angioplasty 02/2012   NSTEMI - PCI to prox LAD  . Chronic anticoagulation December 2014   Switch from Xarelto to warfarin.  . Degenerative disc disease, cervical    Dx years ago  . Depression   . 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 Hypertension   . NSTEMI (non-ST elevated myocardial infarction) (HCC)    07/18/13, stable cath with patent stent; preserved EF by echo; follow-up Echo for CVA 09/2013: EF 60%, mild LVH. No RDW and a. No cardiac source for embolism noted. Bubble study was not performed  . NSTEMI (non-ST elevated myocardial infarction), 07/18/13, stable cath with patent stent. 01/31/2012  . OSA (obstructive sleep apnea)    Diagnosis long ago, used to use CPAP, not currently on any treatment.  . Polysubstance abuse    Past history of cocaine and marijuana, both smoking. Also long standing tobacco abuse.   . Presence of drug coated stent in LAD coronary artery 02/2012   Promus Element DES 4.0 mm x 28 mm; converted from Brilinta to aspirin during stroke evaluation in February 2015    Past Surgical History:  Procedure Laterality Date  . ANTERIOR CERVICAL   DECOMP/DISCECTOMY FUSION  11/28/2011   Procedure: ANTERIOR CERVICAL DECOMPRESSION/DISCECTOMY FUSION 1 LEVEL;  Surgeon: Henry A Pool, MD;  Location: [facility];  Service: Neurosurgery;  Laterality: N/A;  Anterior Cervical Six-Seven Decompression and Fusion  . LEFT HEART CATHETERIZATION WITH CORONARY ANGIOGRAM N/A 01/31/2012   Procedure: LEFT HEART CATHETERIZATION WITH CORONARY ANGIOGRAM;  Surgeon: Jonathan J Berry, MD;  Location: [facility];  Service: Cardiovascular: NSTEMI -  p-mLAD ~80% thrombotic lesion -> treated with [id number] Inhibitor & staged PCI  . LEFT HEART  CATHETERIZATION WITH CORONARY ANGIOGRAM N/A 12/17/2012   Procedure: LEFT HEART CATHETERIZATION WITH CORONARY ANGIOGRAM;  Surgeon: David W Harding, MD;  Location: [facility];  Service: Cardiovascular: Patent LAD DES.  CP thought to be Non-anginal  . LEFT HEART CATHETERIZATION WITH CORONARY ANGIOGRAM N/A 07/18/2013   Procedure: LEFT HEART CATHETERIZATION WITH CORONARY ANGIOGRAM;  Surgeon: David W Harding, MD;  Location: [facility];  Service: Cardiovascular: patent stent, stable CAD.  ? Sx related to Myopericarditis.  . PERCUTANEOUS CORONARY STENT INTERVENTION (PCI-S) N/A 02/02/2012   Procedure: PERCUTANEOUS CORONARY STENT INTERVENTION (PCI-S);  Surgeon: David W Harding, MD;  Location: [facility];  Service: Cardiovascular: Staged LAD PCI - Promus DES 4x28.    . TUMOR REMOVAL     left foot third toe     Allergies  Allergies  Allergen Reactions  . Ibuprofen Other (See Comments)    Doctor told him he could not take    Inpatient Medications    . aspirin EC  81 mg Oral Daily  . atorvastatin  80 mg Oral QHS  . enoxaparin (LOVENOX) injection  40 mg Subcutaneous QHS  . mometasone-formoterol  2 puff Inhalation BID  . nicotine  7 mg Transdermal Daily    Family History    Family History  Problem Relation Age of Onset  . Coronary artery disease Father     died of MI  . Stroke Father 60  . Heart disease Father   . Diabetes Father   . Aneurysm Mother 45    intracranial aneurysm rupture  . Hypertension Mother   . Hypertension Maternal Grandmother   . Diabetes Maternal Grandmother   . Hypertension Paternal Grandmother   . Hypertension Paternal Grandfather   . Cancer Maternal Aunt   . Anesthesia problems Neg Hx   . Hypotension Neg Hx   . Malignant hyperthermia Neg Hx   . Pseudochol deficiency Neg Hx     Social History    Social History   Social History  . Marital status: Significant Other    Spouse name: N/A  . Number of children: N/A  . Years of education: N/A   Occupational  History  . Not on file.   Social History Main Topics  . Smoking status: Current Every Day Smoker    Packs/day: 1.00    Years: 42.00    Types: Cigarettes    Start date: 01/12/1971  . Smokeless tobacco: Never Used  . Alcohol use No     Comment: Report being abstinent since ~January 2015  . Drug use:     Types: Cocaine, Marijuana, Other-see comments     Comment: sts hasnt used cocaine in years. but used marijuana daily.   . Sexual activity: Yes    Birth control/ protection: None   Other Topics Concern  . Not on file   Social History Narrative   He tells me that since his father-in-law, who they were caring for completely, died in December 2017, he and his   wife have basically been homeless because the remainder of her family to call the money and belongings. He currently is on disability, therefore is unable to really work without losing his disability. She is also on disability making it very difficult. They're currently living in a motel to be off the streets. They live in a car for several months. As result, he has not been getting his medications filled.     Review of Systems    General:  No chills, fever, night sweats or weight changes.  Cardiovascular:  + chest pain, dyspnea on exertion, edema, orthopnea, palpitations, paroxysmal nocturnal dyspnea. Dermatological: No rash, lesions/masses Respiratory: No cough, dyspnea Urologic: No hematuria, dysuria Abdominal:   No nausea, vomiting, diarrhea, bright red blood per rectum, melena, or hematemesis Neurologic:  No visual changes, wkns, changes in mental status. All other systems reviewed and are otherwise negative except as noted above.  Physical Exam    Blood pressure (!) 91/46, pulse 76, temperature 98.2 F (36.8 C), temperature source Oral, resp. rate 18, height 6' 1" (1.854 m), weight 169 lb 11.2 oz (77 kg), SpO2 99 %.  General: Anxious male  Psych: Normal affect. Neuro: Alert and oriented X 3. Moves all extremities  spontaneously. HEENT: Normal  Neck: Supple without bruits or JVD. Lungs:  Resp regular and unlabored, CTA. Heart: RRR no s3, s4, or murmurs. Abdomen: Soft, non-tender, non-distended, BS + x 4.  Extremities: No clubbing, cyanosis or edema. DP/PT/Radials 2+ and equal bilaterally.  Labs    Troponin (Point of Care Test)  Recent Labs  07/07/16 1850  TROPIPOC 0.00    Recent Labs  07/08/16 0136 07/08/16 0731  TROPONINI <0.03 <0.03   Lab Results  Component Value Date   WBC 6.3 07/07/2016   HGB 15.3 07/07/2016   HCT 44.1 07/07/2016   MCV 91.5 07/07/2016   PLT 260 07/07/2016    Recent Labs Lab 07/07/16 1816  NA 141  K 4.3  CL 107  CO2 27  BUN 15  CREATININE 1.50*  CALCIUM 9.5  GLUCOSE 91   Lab Results  Component Value Date   CHOL 112 03/10/2016   HDL 48 03/10/2016   LDLCALC 55 03/10/2016   TRIG 45 03/10/2016   Lab Results  Component Value Date   DDIMER <0.27 09/20/2014     Radiology Studies    Dg Chest 2 View  Result Date: 07/07/2016 CLINICAL DATA:  Chest pain since 07/04/2016. EXAM: CHEST  2 VIEW COMPARISON:  CT chest and PA and lateral chest 07/02/2016. FINDINGS: Lungs are clear. Heart size is normal. No pneumothorax or pleural effusion. No focal bony abnormality. IMPRESSION: No acute disease. Electronically Signed   By: Thomas  Dalessio M.D.   On: 07/07/2016 20:24   Dg Chest 2 View  Result Date: 07/02/2016 CLINICAL DATA:  Chest pain for 3 days EXAM: CHEST  2 VIEW COMPARISON:  Chest radiograph and chest CT March 09, 2016 FINDINGS: There is a probable nipple shadow on the left. There is no edema or consolidation. Heart size and pulmonary vascularity are normal. No adenopathy. There are areas of degenerative change in the thoracic spine. A stent is noted in the left anterior descending coronary artery. There is postoperative change in the lower cervical spine region. No pneumothorax. IMPRESSION: Suspect nipple shadow on the left; repeat study with nipple markers  to confirm advised. No edema or consolidation. Electronically Signed   By: William  Woodruff III M.D.   On: 07/02/2016 10:09   Ct Angio Chest Pe W   And/or Wo Contrast  Result Date: 07/02/2016 CLINICAL DATA:  Shortness of breath and chest pain. Personal history pulmonary embolus. The patient has stopped his Coumadin. EXAM: CT ANGIOGRAPHY CHEST WITH CONTRAST TECHNIQUE: Multidetector CT imaging of the chest was performed using the standard protocol during bolus administration of intravenous contrast. Multiplanar CT image reconstructions and MIPs were obtained to evaluate the vascular anatomy. CONTRAST:  100 mL Isovue 370 COMPARISON:  CT AAA of the chest 03/09/2016. CT of the abdomen and pelvis 01/02/2015. FINDINGS: Cardiovascular: Pulmonary arterial opacification is excellent. There are no focal filling defects to suggest pulmonary emboli. Heart size is normal. There is no significant pericardial effusion. Coronary artery stents are noted. Mediastinum/Nodes: No significant mediastinal or axillary adenopathy is present. Lungs/Pleura: The lungs are clear. No focal nodule, mass, or airspace disease is present. Upper Abdomen: Hepatic cysts are stable. A left renal cyst is stable is well. No acute or new lesions are present Musculoskeletal: Bone windows are unremarkable. No focal lytic or blastic lesions are present. Cervical spinal fusion is noted. Review of the MIP images confirms the above findings. IMPRESSION: 1. No pulmonary embolus. 2. No acute or focal lesion to explain the patient's chest pain or shortness of breath. 3. Stable hepatic and renal cysts. Electronically Signed   By: Christopher  Mattern M.D.   On: 07/02/2016 14:18   Nm Myocar Multi W/spect W/wall Motion / Ef  Result Date: 07/04/2016 CLINICAL DATA:  Atypical chest pain. History of coronary disease with stenting. No ST changes on stress echocardiogram EXAM: MYOCARDIAL IMAGING WITH SPECT (REST AND PHARMACOLOGIC-STRESS) GATED LEFT VENTRICULAR WALL  MOTION STUDY LEFT VENTRICULAR EJECTION FRACTION TECHNIQUE: Standard myocardial SPECT imaging was performed after resting intravenous injection of 10 mCi Tc-99m tetrofosmin. Subsequently, intravenous infusion of Lexiscan was performed under the supervision of the Cardiology staff. At peak effect of the drug, 30 mCi Tc-99m tetrofosmin was injected intravenously and standard myocardial SPECT imaging was performed. Quantitative gated imaging was also performed to evaluate left ventricular wall motion, and estimate left ventricular ejection fraction. COMPARISON:  Stress echocardiogram, CTA chest 07/03/2015 FINDINGS: Perfusion: Small to moderate-sized defect within the mid and apical segment segments of the inferolateral wall which normalizes from stress rest. Wall Motion: Normal left ventricular wall motion. No left ventricular dilation. Left Ventricular Ejection Fraction: 44 % End diastolic volume 151 ml End systolic volume 84 ml IMPRESSION: 1. Smaller to moderate region of reversible ischemia within the mid apical segments of the inferolateral wall. 2. Normal left ventricular wall motion. 3. Left ventricular ejection fraction 44% 4. Non invasive risk stratification*: Intermediate *2012 Appropriate Use Criteria for Coronary Revascularization Focused Update: J Am Coll Cardiol. 2012;59(9):857-881. http://content.onlinejacc.org/article.aspx?articleid=1201161 These results will be called to the ordering clinician or representative by the Radiologist Assistant, and communication documented in the PACS or zVision Dashboard. Electronically Signed   By: Stewart  Edmunds M.D.   On: 07/04/2016 13:46    EKG & Cardiac Imaging    EKG: NSR, repolarization abnormality in the anterior leads  Echocardiogram: 07/04/16 Study Conclusions  - Left ventricle: The cavity size was normal. Wall thickness was   increased in a pattern of moderate LVH. Systolic function was   vigorous. The estimated ejection fraction was in the range of  65%   to 70%. Wall motion was normal; there were no regional wall   motion abnormalities. - Left atrium: The atrium was mildly dilated. - Tricuspid valve: There was moderate regurgitation.  Impressions:  - Compared to the prior study, there has been no significant     interval change.   Assessment & Plan   1. Chest pain with high risk for cardiac etiology: patient with abnormal nuc. Will plan for left heart cath today. He has a history of CAD and there is risk that his CAD has progressed with his ongoing tobacco and illicit drug use.   Echo with vigorous systolic function, no wall motion abnormality.   2. History of cocaine abuse: Encouraged cessation  3. HTN: Well controlled. Not on any antihypertensives.   4. History of CVA: On statin and ASA  5. History of bilateral PE: Not on any anticoagulation.   Signed, Erin E Smith, NP 07/08/2016, 10:55 AM Pager: [phone]  I have seen and examined the patient along with Erin E Smith, NP.  I have reviewed the chart, notes and new data.  I agree with PA/NP's note.  Key new complaints: describes left chest discomfort similar to past event in 2013 Key examination changes: no arrhythmia, no signs of Hf Key new findings / data: excellent lipids. UDS not done, but he admits to cocaine use last Friday PLAN: Angiography today with Dr. Harding. This procedure has been fully reviewed with the patient and written informed consent has been obtained.   [place], MD, FACC [facility] [phone] 07/08/2016, 12:14 PM  

## 2016-07-08 NOTE — Progress Notes (Signed)
   Subjective: Patient continues to have chest pain. He has mild shortness of breath. Pain is reproducible on palpation.  Objective:  Vital signs in last 24 hours: Vitals:   07/08/16 0036 07/08/16 0500 07/08/16 0753 07/08/16 0810  BP: 136/80 116/64  (!) 91/46  Pulse: 71 85 87 76  Resp:  20 17 18   Temp: 98.6 F (37 C) 97.7 F (36.5 C)  98.2 F (36.8 C)  TempSrc: Oral Oral  Oral  SpO2: 100% 99% 99% 99%  Weight: 169 lb 11.2 oz (77 kg)     Height: 6\' 1"  (1.854 m)      Physical Exam  Constitutional: He is oriented to person, place, and time. He appears well-developed and well-nourished.  HENT:  Head: Normocephalic and atraumatic.  Cardiovascular: Normal rate and regular rhythm.  Exam reveals no gallop and no friction rub.   No murmur heard. Respiratory: Effort normal and breath sounds normal. No respiratory distress. He has no wheezes.  GI: Soft. Bowel sounds are normal. He exhibits no distension. There is no tenderness.  Musculoskeletal: He exhibits tenderness. He exhibits no edema.  Tenderness to palpation over the sternum  Neurological: He is alert and oriented to person, place, and time.     Assessment/Plan:  Principal Problem:   Chest pain with moderate risk for cardiac etiology Active Problems:   Tobacco abuse   CAD S/P percutaneous coronary angioplasty 02/21/12 non-STEMI -Promus DES 4.0 mm 20 mm to prox LAD   History of arterial ischemic stroke   Malnutrition of moderate degree (HCC)   Cocaine abuse   Essential hypertension  [name] is a 55 year old male who presented with atypical chest pain in the setting of cocaine abuse and medication noncompliance.  1. Chest pain Patient has a cardiac history including coronary artery disease status post stent to the proximal LAD in 2013, prior pulmonary embolism and prior stroke who presents again with recurrent chest pain in the setting of medication noncompliance and active cocaine use. He was recently admitted between  11/29-12/1 and had a nuclear stress test that was read as intermediate risk. Cardiology recommended heart catheterization. However, the patient apparently got upset at a nurse tech and left AGAINST MEDICAL ADVICE. Over the weekend he states that he smoked crack again but presented last night with recurrent chest pain and wanting to have a heart cath. -- Cardiology has been consulted and the patient will have a cath today -- Follow-up results of heart catheterization and recommendations by cardiology -- Aspirin 81 mg -- Atorvastatin 80 mg -- Avoid beta blockers in a patient with ongoing cocaine use  2. History of coronary artery disease -- Aspirin 81 mg -- Atorvastatin 80 mg  3. Asthma Monitor clinically  DVT/PE prophylaxis: Lovenox 40 mg FEN/GI: Regular diet  Dispo: Anticipated discharge pending results of cath.  Thomasene LotJames Umair Rosiles, MD 07/08/2016, 9:09 AM Pager: [phone]

## 2016-07-08 NOTE — Progress Notes (Signed)
Pt has arrived back to 2w from cath lab. Telemetry box applied and CCMD notified. TR band is present on right wrist with 12cc of air. Site is level 0. Lunch tray ordered. Vitals being monitored. Will continue plan of care.   Berdine DanceLauren Moffitt BSN, RN

## 2016-07-08 NOTE — Progress Notes (Signed)
New Admission Note:   Arrival Method: From Via Stretcher Mental Orientation: AOx4 Telemetry: Box 2w23 Assessment: Completed Skin: Intact IV: R FA(SL) Pain: 6/10 Pt refuse pain medication Tubes: None Safety Measures: Safety Fall Prevention Plan has been discussed  Admission:  [facility] [place] Orientation: Patient has been orientated to the room, unit and staff.  Family:   Orders to be reviewed and implemented. Will continue to monitor the patient. Call light has been placed within reach. Admitting doctor contacted.    Gregor HamsAlisha Deasha Clendenin, RN

## 2016-07-08 NOTE — Interval H&P Note (Signed)
History and Physical Interval Note:  07/08/2016 12:28 PM  Timothy Peck  has presented today for surgery, with the diagnosis of cp- with abnormal nuclear stress test (intermediate risk). He has history of non-STEMI with PCI to the LAD in the past.   The various methods of treatment have been discussed with the patient and family. After consideration of risks, benefits and other options for treatment, the patient has consented to  Procedure(s): Left Heart Cath and Coronary Angiography (N/A) possible Percutaneous Coronary Intervention as a surgical intervention .  The patient's history has been reviewed, patient examined, no change in status, stable for surgery.  I have reviewed the patient's chart and labs.  Questions were answered to the patient's satisfaction.    Cath Lab Visit (complete for each Cath Lab visit)  Clinical Evaluation Leading to the Procedure:   ACS: Yes.    Non-ACS:    Anginal Classification: CCS IV  Anti-ischemic medical therapy: Minimal Therapy (1 class of medications)  Non-Invasive Test Results: Intermediate-risk stress test findings: cardiac mortality 1-3%/year  Prior CABG: No previous CABG    Bryan Lemmaavid Harding

## 2016-07-08 NOTE — Progress Notes (Signed)
ANTICOAGULATION CONSULT NOTE - Initial Consult  Pharmacy Consult for coumadin Indication: hx recurrent PEs  Allergies  Allergen Reactions  . Ibuprofen Other (See Comments)    Doctor told him he could not take    Patient Measurements: Height: 6\' 1"  (185.4 cm) Weight: 169 lb 11.2 oz (77 kg) IBW/kg (Calculated) : 79.9   Vital Signs: Temp: 98.1 F (36.7 C) (12/05 1216) Temp Source: Oral (12/05 1216) BP: 123/84 (12/05 1321) Pulse Rate: 0 (12/05 1326)  Labs:  Recent Labs  07/07/16 1816 07/08/16 0136 07/08/16 0731 07/08/16 1207  HGB 15.3  --   --   --   HCT 44.1  --   --   --   PLT 260  --   --   --   CREATININE 1.50*  --   --  1.32*  TROPONINI  --  <0.03 <0.03 <0.03    Estimated Creatinine Clearance: 70.5 mL/min (by C-G formula based on SCr of 1.32 mg/dL (H)).   Medical History: Past Medical History:  Diagnosis Date  . Asthma   . Bilateral pulmonary embolism [facility](HCC) August 2014  . CAD S/P percutaneous coronary angioplasty 02/2012   NSTEMI - PCI to prox LAD  . Chronic anticoagulation December 2014   Switch from Xarelto to warfarin.  . Degenerative disc disease, cervical    Dx years ago  . Depression   . 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 Hypertension   . NSTEMI (non-ST elevated myocardial infarction) (HCC)    07/18/13, stable cath with patent stent; preserved EF by echo; follow-up Echo for CVA 09/2013: EF 60%, mild LVH. No RDW and a. No cardiac source for embolism noted. Bubble study was not performed  . NSTEMI (non-ST elevated myocardial infarction), 07/18/13, stable cath with patent stent. 01/31/2012  . OSA (obstructive sleep apnea)    Diagnosis long ago, used to use CPAP, not currently on any treatment.  . Polysubstance abuse    Past history of cocaine and marijuana, both smoking. Also long standing tobacco abuse.   . Presence of drug coated stent in LAD coronary artery 02/2012   Promus Element DES 4.0 mm x 28 mm; converted from Brilinta to aspirin  during stroke evaluation in February 2015    Medications:  Prescriptions Prior to Admission  Medication Sig Dispense Refill Last Dose  . aspirin EC 81 MG tablet Take 1 tablet (81 mg total) by mouth daily. (Patient not taking: Reported on 07/02/2016) 90 tablet 3 Not Taking at Unknown time  . atorvastatin (LIPITOR) 80 MG tablet Take 1 tablet (80 mg total) by mouth at bedtime. (Patient not taking: Reported on 07/02/2016) 30 tablet 11 Not Taking at Unknown time  . isosorbide dinitrate (ISORDIL) 20 MG tablet Take 1 tablet (20 mg total) by mouth 2 (two) times daily. (Patient not taking: Reported on 07/02/2016) 60 tablet 11 Not Taking at Unknown time  . lisinopril (PRINIVIL,ZESTRIL) 5 MG tablet Take 1 tablet (5 mg total) by mouth daily. (Patient not taking: Reported on 07/02/2016) 30 tablet 11 Not Taking at Unknown time  . mometasone-formoterol (DULERA) 100-5 MCG/ACT AERO Inhale 2 puffs into the lungs 2 (two) times daily. (Patient not taking: Reported on 07/02/2016) 1 Inhaler 5 Not Taking at Unknown time    Assessment: [name] to start coumadin per pharmacy for hx recurrent PE's.  He has been on coumadin in the past.  Coumadin score = 10.  Creat 1.32. CBC WNL.   Goal of Therapy:  INR 2-3  Plan:  Coumadin 10 mg po x 1 dose Daily INR Continue LMWH 40 qday until INR tx Coumadin edu done w/ pt - need to work out who will f/u outpt coumadin [place]  Kherington Meraz T. Azavion Bouillon, Pharm.D. [phone] 07/08/2016 2:48 PM

## 2016-07-08 NOTE — Consult Note (Signed)
Cardiology Consult    Patient ID: Timothy Peck MRN: [id number], DOB/AGE: 53/03/1961   Admit date: 07/07/2016 Date of Consult: 07/08/2016  Primary Physician: Doris Cheadleeepak Advani, MD Reason for Consult: Chest pain Primary Cardiologist: Dr. Herbie BaltimoreHarding Requesting Provider: Dr. Obie DredgeBlum   Patient Profile    [name] is a 55 year old male with a past medical history of NSTEMI in June 2013 with DES placed to prox LAD, NSTEMI in Dec. 2014 with patent LAD stent, ongoing cocaine and tobacco abuse, severe paranoid schizophrenia, bilateral PE (not on any anticoagulation), and CVA. He presents with chest pain.   History of Present Illness    [name] was recently hospitalized from 11/29-12/1 for chest pain and was seen in consultation by Cardiology. A Lexiscan Myoview was ordered and he had a small to moderate sized defect within the mid and apical segments of the inferolateral wall. He was scheduled for a heart cath on 07/07/16 but unfortunately left AMA on 07/04/16.   He tells me that he has had chest pain for the past 3-4 days that occurs with minimal activity. It is a pressure - like sensation. It feels like someone is sitting on the left side of his chest. He denies associated symptoms of SOB, diaphoresis and nausea.   He used cocaine on Friday 07/04/16 and had more intense chest pain after using. He is very paranoid, says that he currently is having auditory hallucinations.   He is homeless, and [place] [facility] [place]. He says that he is compliant with his medications. He does have medicaid and we talked about if he gets a stent he will have to adhere to anti-platelet therapy. He is agreeable.   He last saw Dr. Herbie BaltimoreHarding in September of this year. He reported exertional angina to Dr. Herbie BaltimoreHarding who recommended a stress test, but he did not appear for the outpatient stress test.   His last heart cath was in Dec. 2014 in the setting of NSTEMI, his proximal LAD stent (placed the year  prior) was widely patent. He was treated for myo-pericarditis. 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   Past Medical History   Past Medical History:  Diagnosis Date  . Asthma   . Bilateral pulmonary embolism [facility](HCC) August 2014  . CAD S/P percutaneous coronary angioplasty 02/2012   NSTEMI - PCI to prox LAD  . Chronic anticoagulation December 2014   Switch from Xarelto to warfarin.  . Degenerative disc disease, cervical    Dx years ago  . Depression   . 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 Hypertension   . NSTEMI (non-ST elevated myocardial infarction) (HCC)    07/18/13, stable cath with patent stent; preserved EF by echo; follow-up Echo for CVA 09/2013: EF 60%, mild LVH. No RDW and a. No cardiac source for embolism noted. Bubble study was not performed  . NSTEMI (non-ST elevated myocardial infarction), 07/18/13, stable cath with patent stent. 01/31/2012  . OSA (obstructive sleep apnea)    Diagnosis long ago, used to use CPAP, not currently on any treatment.  . Polysubstance abuse    Past history of cocaine and marijuana, both smoking. Also long standing tobacco abuse.   . Presence of drug coated stent in LAD coronary artery 02/2012   Promus Element DES 4.0 mm x 28 mm; converted from Brilinta to aspirin during stroke evaluation in February 2015    Past Surgical History:  Procedure Laterality Date  . ANTERIOR CERVICAL  DECOMP/DISCECTOMY FUSION  11/28/2011   Procedure: ANTERIOR CERVICAL DECOMPRESSION/DISCECTOMY FUSION 1 LEVEL;  Surgeon: Temple Pacini, MD;  Location: [facility] NEURO ORS;  Service: Neurosurgery;  Laterality: N/A;  Anterior Cervical Six-Seven Decompression and Fusion  . LEFT HEART CATHETERIZATION WITH CORONARY ANGIOGRAM N/A 01/31/2012   Procedure: LEFT HEART CATHETERIZATION WITH CORONARY ANGIOGRAM;  Surgeon: Runell Gess, MD;  Location: [facility] CATH LAB;  Service: Cardiovascular: NSTEMI -  p-mLAD ~80% thrombotic lesion -> treated with [id number] Inhibitor & staged PCI  . LEFT HEART  CATHETERIZATION WITH CORONARY ANGIOGRAM N/A 12/17/2012   Procedure: LEFT HEART CATHETERIZATION WITH CORONARY ANGIOGRAM;  Surgeon: Marykay Lex, MD;  Location: [facility] CATH LAB;  Service: Cardiovascular: Patent LAD DES.  CP thought to be Non-anginal  . LEFT HEART CATHETERIZATION WITH CORONARY ANGIOGRAM N/A 07/18/2013   Procedure: LEFT HEART CATHETERIZATION WITH CORONARY ANGIOGRAM;  Surgeon: Marykay Lex, MD;  Location: [facility] CATH LAB;  Service: Cardiovascular: patent stent, stable CAD.  ? Sx related to Myopericarditis.  Marland Kitchen PERCUTANEOUS CORONARY STENT INTERVENTION (PCI-S) N/A 02/02/2012   Procedure: PERCUTANEOUS CORONARY STENT INTERVENTION (PCI-S);  Surgeon: Marykay Lex, MD;  Location: [facility] CATH LAB;  Service: Cardiovascular: Staged LAD PCI - Promus DES 4x28.    . TUMOR REMOVAL     left foot third toe     Allergies  Allergies  Allergen Reactions  . Ibuprofen Other (See Comments)    Doctor told him he could not take    Inpatient Medications    . aspirin EC  81 mg Oral Daily  . atorvastatin  80 mg Oral QHS  . enoxaparin (LOVENOX) injection  40 mg Subcutaneous QHS  . mometasone-formoterol  2 puff Inhalation BID  . nicotine  7 mg Transdermal Daily    Family History    Family History  Problem Relation Age of Onset  . Coronary artery disease Father     died of MI  . Stroke Father 29  . Heart disease Father   . Diabetes Father   . Aneurysm Mother 45    intracranial aneurysm rupture  . Hypertension Mother   . Hypertension Maternal Grandmother   . Diabetes Maternal Grandmother   . Hypertension Paternal Grandmother   . Hypertension Paternal Grandfather   . Cancer Maternal Aunt   . Anesthesia problems Neg Hx   . Hypotension Neg Hx   . Malignant hyperthermia Neg Hx   . Pseudochol deficiency Neg Hx     Social History    Social History   Social History  . Marital status: Significant Other    Spouse name: N/A  . Number of children: N/A  . Years of education: N/A   Occupational  History  . Not on file.   Social History Main Topics  . Smoking status: Current Every Day Smoker    Packs/day: 1.00    Years: 42.00    Types: Cigarettes    Start date: 01/12/1971  . Smokeless tobacco: Never Used  . Alcohol use No     Comment: Report being abstinent since ~January 2015  . Drug use:     Types: Cocaine, Marijuana, Other-see comments     Comment: sts hasnt used cocaine in years. but used marijuana daily.   Marland Kitchen Sexual activity: Yes    Birth control/ protection: None   Other Topics Concern  . Not on file   Social History Narrative   He tells me that since his father-in-law, who they were caring for completely, died in 20-Dec-2017he and his  wife have basically been homeless because the remainder of her family to call the money and belongings. He currently is on disability, therefore is unable to really work without losing his disability. She is also on disability making it very difficult. They're currently living in a motel to be off the streets. They live in a car for several months. As result, he has not been getting his medications filled.     Review of Systems    General:  No chills, fever, night sweats or weight changes.  Cardiovascular:  + chest pain, dyspnea on exertion, edema, orthopnea, palpitations, paroxysmal nocturnal dyspnea. Dermatological: No rash, lesions/masses Respiratory: No cough, dyspnea Urologic: No hematuria, dysuria Abdominal:   No nausea, vomiting, diarrhea, bright red blood per rectum, melena, or hematemesis Neurologic:  No visual changes, wkns, changes in mental status. All other systems reviewed and are otherwise negative except as noted above.  Physical Exam    Blood pressure (!) 91/46, pulse 76, temperature 98.2 F (36.8 C), temperature source Oral, resp. rate 18, height 6\' 1"  (1.854 m), weight 169 lb 11.2 oz (77 kg), SpO2 99 %.  General: Anxious male  Psych: Normal affect. Neuro: Alert and oriented X 3. Moves all extremities  spontaneously. HEENT: Normal  Neck: Supple without bruits or JVD. Lungs:  Resp regular and unlabored, CTA. Heart: RRR no s3, s4, or murmurs. Abdomen: Soft, non-tender, non-distended, BS + x 4.  Extremities: No clubbing, cyanosis or edema. DP/PT/Radials 2+ and equal bilaterally.  Labs    Troponin [facility] Test)  Recent Labs  07/07/16 1850  TROPIPOC 0.00    Recent Labs  07/08/16 0136 07/08/16 0731  TROPONINI <0.03 <0.03   Lab Results  Component Value Date   WBC 6.3 07/07/2016   HGB 15.3 07/07/2016   HCT 44.1 07/07/2016   MCV 91.5 07/07/2016   PLT 260 07/07/2016    Recent Labs Lab 07/07/16 1816  NA 141  K 4.3  CL 107  CO2 27  BUN 15  CREATININE 1.50*  CALCIUM 9.5  GLUCOSE 91   Lab Results  Component Value Date   CHOL 112 03/10/2016   HDL 48 03/10/2016   LDLCALC 55 03/10/2016   TRIG 45 03/10/2016   Lab Results  Component Value Date   DDIMER <0.27 09/20/2014     Radiology Studies    Dg Chest 2 View  Result Date: 07/07/2016 CLINICAL DATA:  Chest pain since 07/04/2016. EXAM: CHEST  2 VIEW COMPARISON:  CT chest and PA and lateral chest 07/02/2016. FINDINGS: Lungs are clear. Heart size is normal. No pneumothorax or pleural effusion. No focal bony abnormality. IMPRESSION: No acute disease. Electronically Signed   By: Drusilla Kanner M.D.   On: 07/07/2016 20:24   Dg Chest 2 View  Result Date: 07/02/2016 CLINICAL DATA:  Chest pain for 3 days EXAM: CHEST  2 VIEW COMPARISON:  Chest radiograph and chest CT March 09, 2016 FINDINGS: There is a probable nipple shadow on the left. There is no edema or consolidation. Heart size and pulmonary vascularity are normal. No adenopathy. There are areas of degenerative change in the thoracic spine. A stent is noted in the left anterior descending coronary artery. There is postoperative change in the lower cervical spine region. No pneumothorax. IMPRESSION: Suspect nipple shadow on the left; repeat study with nipple markers  to confirm advised. No edema or consolidation. Electronically Signed   By: Bretta Bang III M.D.   On: 07/02/2016 10:09   Ct Angio Chest Pe W  And/or Wo Contrast  Result Date: 07/02/2016 CLINICAL DATA:  Shortness of breath and chest pain. Personal history pulmonary embolus. The patient has stopped his Coumadin. EXAM: CT ANGIOGRAPHY CHEST WITH CONTRAST TECHNIQUE: Multidetector CT imaging of the chest was performed using the standard protocol during bolus administration of intravenous contrast. Multiplanar CT image reconstructions and MIPs were obtained to evaluate the vascular anatomy. CONTRAST:  100 mL Isovue 370 COMPARISON:  CT AAA of the chest 03/09/2016. CT of the abdomen and pelvis 01/02/2015. FINDINGS: Cardiovascular: Pulmonary arterial opacification is excellent. There are no focal filling defects to suggest pulmonary emboli. Heart size is normal. There is no significant pericardial effusion. Coronary artery stents are noted. Mediastinum/Nodes: No significant mediastinal or axillary adenopathy is present. Lungs/Pleura: The lungs are clear. No focal nodule, mass, or airspace disease is present. Upper Abdomen: Hepatic cysts are stable. A left renal cyst is stable is well. No acute or new lesions are present Musculoskeletal: Bone windows are unremarkable. No focal lytic or blastic lesions are present. Cervical spinal fusion is noted. Review of the MIP images confirms the above findings. IMPRESSION: 1. No pulmonary embolus. 2. No acute or focal lesion to explain the patient's chest pain or shortness of breath. 3. Stable hepatic and renal cysts. Electronically Signed   By: Marin Robertshristopher  Mattern M.D.   On: 07/02/2016 14:18   Nm Myocar Multi W/spect W/wall Motion / Ef  Result Date: 07/04/2016 CLINICAL DATA:  Atypical chest pain. History of coronary disease with stenting. No ST changes on stress echocardiogram EXAM: MYOCARDIAL IMAGING WITH SPECT (REST AND PHARMACOLOGIC-STRESS) GATED LEFT VENTRICULAR WALL  MOTION STUDY LEFT VENTRICULAR EJECTION FRACTION TECHNIQUE: Standard myocardial SPECT imaging was performed after resting intravenous injection of 10 mCi Tc-892m tetrofosmin. Subsequently, intravenous infusion of Lexiscan was performed under the supervision of the Cardiology staff. At peak effect of the drug, 30 mCi Tc-182m tetrofosmin was injected intravenously and standard myocardial SPECT imaging was performed. Quantitative gated imaging was also performed to evaluate left ventricular wall motion, and estimate left ventricular ejection fraction. COMPARISON:  Stress echocardiogram, CTA chest 07/03/2015 FINDINGS: Perfusion: Small to moderate-sized defect within the mid and apical segment segments of the inferolateral wall which normalizes from stress rest. Wall Motion: Normal left ventricular wall motion. No left ventricular dilation. Left Ventricular Ejection Fraction: 44 % End diastolic volume 151 ml End systolic volume 84 ml IMPRESSION: 1. Smaller to moderate region of reversible ischemia within the mid apical segments of the inferolateral wall. 2. Normal left ventricular wall motion. 3. Left ventricular ejection fraction 44% 4. Non invasive risk stratification*: Intermediate *2012 Appropriate Use Criteria for Coronary Revascularization Focused Update: J Am Coll Cardiol. 2012;59(9):857-881. http://content.dementiazones.comonlinejacc.org/article.aspx?articleid=1201161 These results will be called to the ordering clinician or representative by the Radiologist Assistant, and communication documented in the PACS or zVision Dashboard. Electronically Signed   By: Genevive BiStewart  Edmunds M.D.   On: 07/04/2016 13:46    EKG & Cardiac Imaging    EKG: NSR, repolarization abnormality in the anterior leads  Echocardiogram: 07/04/16 Study Conclusions  - Left ventricle: The cavity size was normal. Wall thickness was   increased in a pattern of moderate LVH. Systolic function was   vigorous. The estimated ejection fraction was in the range of  65%   to 70%. Wall motion was normal; there were no regional wall   motion abnormalities. - Left atrium: The atrium was mildly dilated. - Tricuspid valve: There was moderate regurgitation.  Impressions:  - Compared to the prior study, there has been no significant  interval change.   Assessment & Plan   1. Chest pain with high risk for cardiac etiology: patient with abnormal nuc. Will plan for left heart cath today. He has a history of CAD and there is risk that his CAD has progressed with his ongoing tobacco and illicit drug use.   Echo with vigorous systolic function, no wall motion abnormality.   2. History of cocaine abuse: Encouraged cessation  3. HTN: Well controlled. Not on any antihypertensives.   4. History of CVA: On statin and ASA  5. History of bilateral PE: Not on any anticoagulation.   Signed, Little Ishikawa, NP 07/08/2016, 10:55 AM Pager: [phone]  I have seen and examined the patient along with Little Ishikawa, NP.  I have reviewed the chart, notes and new data.  I agree with PA/NP's note.  Key new complaints: describes left chest discomfort similar to past event in 2013 Key examination changes: no arrhythmia, no signs of Hf Key new findings / data: excellent lipids. UDS not done, but he admits to cocaine use last Friday PLAN: Angiography today with Dr. Herbie Baltimore. This procedure has been fully reviewed with the patient and written informed consent has been obtained.   Thurmon Fair, MD, [facility] [phone] 07/08/2016, 12:14 PM

## 2016-07-09 LAB — PROTIME-INR
INR: 1.05
PROTHROMBIN TIME: 13.7 s (ref 11.4–15.2)

## 2016-07-09 MED ORDER — NICOTINE 14 MG/24HR TD PT24: 14.0000 mg | MEDICATED_PATCH | Freq: Every day | TRANSDERMAL | 0 refills | Status: DC

## 2016-07-09 MED ORDER — WARFARIN SODIUM 10 MG PO TABS
10.0000 mg | ORAL_TABLET | ORAL | Status: AC
  Administered 2016-07-09: 10 mg via ORAL
  Filled 2016-07-09: qty 1

## 2016-07-09 MED ORDER — WARFARIN SODIUM 10 MG PO TABS: 10.0000 mg | ORAL_TABLET | Freq: Once | ORAL | Status: DC

## 2016-07-09 MED ORDER — WARFARIN SODIUM 5 MG PO TABS: 7.5000 mg | ORAL_TABLET | Freq: Every day | ORAL | 0 refills | Status: DC

## 2016-07-09 MED FILL — WARFARIN SODIUM 5 MG TABLET: 5 | 6 days supply | Qty: 9 | Fill #0

## 2016-07-09 MED FILL — SM NICOTINE 14 MG/24HR PATC: 14 | 28 days supply | Qty: 28 | Fill #0

## 2016-07-09 NOTE — Progress Notes (Signed)
   Subjective: No acute events overnight. Patient still has chest pain that is reproducible on palpation. He is recovered appropriately following his heart catheterization without complication.  Objective:  Vital signs in last 24 hours: Vitals:   07/08/16 2018 07/08/16 2200 07/09/16 0538 07/09/16 1005  BP:  (!) 142/95 120/80   Pulse: 61 68 72   Resp: 18 18 18    Temp:  98.5 F (36.9 C) 98.7 F (37.1 C)   TempSrc:  Oral Oral   SpO2: 95% 98% 100% 98%  Weight:      Height:       Physical Exam  Constitutional: He is oriented to person, place, and time. He appears well-developed and well-nourished.  HENT:  Head: Normocephalic and atraumatic.  Cardiovascular: Normal rate and regular rhythm.  Exam reveals no gallop and no friction rub.   No murmur heard. Respiratory: Effort normal and breath sounds normal. No respiratory distress. He has no wheezes.  GI: Soft. Bowel sounds are normal. He exhibits no distension. There is no tenderness.  Musculoskeletal: He exhibits no edema.  Neurological: He is alert and oriented to person, place, and time.     Assessment/Plan:  Principal Problem:   Chest pain with moderate risk for cardiac etiology Active Problems:   Tobacco abuse   CAD S/P percutaneous coronary angioplasty 02/21/12 non-STEMI -Promus DES 4.0 mm 20 mm to prox LAD   History of arterial ischemic stroke   Malnutrition of moderate degree (HCC)   Cocaine abuse   Essential hypertension  [name] is a 55 year old male who presented with atypical chest pain in the setting of cocaine abuse and medication noncompliance.  1. Chest pain  His cardiac catheterization results did not find a cause for the patient's chest pain and at this time I think his presentation is noncardiac in etiology. Cardiology has stated that they also do not think the patient's chest pain is anginal in nature. The most likely etiology for the patient's chest pain is musculoskeletal. He continues to have chest pain  but it is only elicited upon palpation of the sternum. -- Aspirin 81 mg -- Atorvastatin 80 mg  2. History of coronary artery disease -- Aspirin 81 mg -- Atorvastatin 80 mg  3. Asthma Monitor clinically  4. History of PE and CVA The patient has a history of a pulmonary embolism which only occurred on one occasion. Despite documentation in the notes stating the patient has had multiple presentations with diagnosed pulmonary embolism on chart review I only find one occasion when the patient has been diagnosed with a pulmonary embolism. Additionally, the patient has had a prior CVA which was thought to be embolic in nature. At both previous times anticoagulation was started but the patient did not continue to take this medication and was not adherent. Cardiology has recommended that warfarin be restarted. He has been started on warfarin and pharmacy has recommended that the patient be discharged on 7.5 mg  a day and follow-up for titration of his INR. He will need to have his INR checked on 07/14/2016. --Warfarin 7.5 mg  a day   DVT/PE prophylaxis: Lovenox 40 mg FEN/GI: Regular diet    Dispo: Anticipated discharge today.  Thomasene LotJames Donovyn Guidice, MD 07/09/2016, 12:05 PM Pager: [phone]

## 2016-07-09 NOTE — Progress Notes (Signed)
ANTICOAGULATION CONSULT NOTE  Pharmacy Consult for coumadin Indication: hx recurrent PEs  Allergies  Allergen Reactions  . Ibuprofen Other (See Comments)    Doctor told him he could not take    Patient Measurements: Height: 6\' 1"  (185.4 cm) Weight: 169 lb 11.2 oz (77 kg) IBW/kg (Calculated) : 79.9   Vital Signs: Temp: 98.7 F (37.1 C) (12/06 0538) Temp Source: Oral (12/06 0538) BP: 120/80 (12/06 0538) Pulse Rate: 72 (12/06 0538)  Labs:  Recent Labs  07/07/16 1816 07/08/16 0136 07/08/16 0731 07/08/16 1207 07/09/16 0343  HGB 15.3  --   --   --   --   HCT 44.1  --   --   --   --   PLT 260  --   --   --   --   LABPROT  --   --   --   --  13.7  INR  --   --   --   --  1.05  CREATININE 1.50*  --   --  1.32*  --   TROPONINI  --  <0.03 <0.03 <0.03  --     Estimated Creatinine Clearance: 70.5 mL/min (by C-G formula based on SCr of 1.32 mg/dL (H)).   Medical History: Past Medical History:  Diagnosis Date  . Asthma   . Bilateral pulmonary embolism [facility](HCC) August 2014  . CAD S/P percutaneous coronary angioplasty 02/2012   NSTEMI - PCI to prox LAD  . Chronic anticoagulation December 2014   Switch from Xarelto to warfarin.  . Degenerative disc disease, cervical    Dx years ago  . Depression   . 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 Hypertension   . NSTEMI (non-ST elevated myocardial infarction) (HCC)    07/18/13, stable cath with patent stent; preserved EF by echo; follow-up Echo for CVA 09/2013: EF 60%, mild LVH. No RDW and a. No cardiac source for embolism noted. Bubble study was not performed  . NSTEMI (non-ST elevated myocardial infarction), 07/18/13, stable cath with patent stent. 01/31/2012  . OSA (obstructive sleep apnea)    Diagnosis long ago, used to use CPAP, not currently on any treatment.  . Polysubstance abuse    Past history of cocaine and marijuana, both smoking. Also long standing tobacco abuse.   . Presence of drug coated stent in LAD coronary  artery 02/2012   Promus Element DES 4.0 mm x 28 mm; converted from Brilinta to aspirin during stroke evaluation in February 2015    Medications:  Prescriptions Prior to Admission  Medication Sig Dispense Refill Last Dose  . aspirin EC 81 MG tablet Take 1 tablet (81 mg total) by mouth daily. (Patient not taking: Reported on 07/02/2016) 90 tablet 3 Not Taking at Unknown time  . atorvastatin (LIPITOR) 80 MG tablet Take 1 tablet (80 mg total) by mouth at bedtime. (Patient not taking: Reported on 07/02/2016) 30 tablet 11 Not Taking at Unknown time  . isosorbide dinitrate (ISORDIL) 20 MG tablet Take 1 tablet (20 mg total) by mouth 2 (two) times daily. (Patient not taking: Reported on 07/02/2016) 60 tablet 11 Not Taking at Unknown time  . lisinopril (PRINIVIL,ZESTRIL) 5 MG tablet Take 1 tablet (5 mg total) by mouth daily. (Patient not taking: Reported on 07/02/2016) 30 tablet 11 Not Taking at Unknown time  . mometasone-formoterol (DULERA) 100-5 MCG/ACT AERO Inhale 2 puffs into the lungs 2 (two) times daily. (Patient not taking: Reported on 07/02/2016) 1 Inhaler 5 Not Taking at Unknown time  Assessment: [name] to start coumadin per pharmacy for hx recurrent PE's.  He has been on coumadin in the past.  Coumadin score = 10.  Creat 1.32. CBC WNL. INR still baseline at 1.05 after first dose of 10 mg - as expected.  No bleeding reported. IMTS concerned about pt compliance and ability to f/u as outpt.   Goal of Therapy:  INR 2-3   Plan:  Repeat Coumadin 10 mg po x 1 dose If plan to DC home would give RX of 5 mg tablets with instructions to take 1.5 tablets = 7.5 mg until INR checked - rec to check on Monday 12/11 Daily INR Continue LMWH 40 qday until INR tx Coumadin edu done w/ pt - need to work out who will f/u outpt coumadin [place]  Windsor Goeken T. Noelle Sease, Pharm.D. [phone] 07/09/2016 7:38 AM

## 2016-07-09 NOTE — Progress Notes (Addendum)
Clinical Social Worker met patient at bedside. Patient stated he needs assistance with transportation to a shelter in [place]. Patient forgot what the name of the shelter but will give CSW more information on shelter. Patient stated that the shelter will provide him and his fiance with stable housing. Patient stated he would have to stay at the shelter for a couple of weeks before receiving stable housing from the shelter. CSW stated to patient that she will look into transportation but patient would need to think of other resources in case CSW is unable to give assistance. Patient made aware that if resources are available for him it would only benefit him and not his fiance. Patient stated that he will also attend a drug rehab through the shelter in [facility]. CSW will remains available for support and discharge needs.     2:40pm: CSW met patient and fiance at bedside to discuss discharge plan. Patient's fiance stated she is not aware of the name of the shelter but just knows it's located in [place] [facility] and it is near the bus stop. CSW printed the times and map for [facility], as well gave patient two bus ticket to take patient to downtown [place] to get on the bus for [employer]. CSW will hold onto tickets and money until discharge.    Rhea Pink, MSW,  [place]

## 2016-07-09 NOTE — Care Management Note (Signed)
Case Management Note Donn PieriniKristi Janiel Derhammer RN, BSN Unit 2W-Case Manager [phone]  Patient Details  Name: Doristine BosworthByron G Boreman MRN: [id number] Date of Birth: 05/06/1961  Subjective/Objective:   Pt admitted with chest pain                 Action/Plan: PTA pt was homeless- CSW has been consulted- per conversation with pt he states that he plans to go to w/s to stay at a shelter there that will assist him in finding a place to live- pt has Medicaid and [facility]- per pt he still plans to go to the clinic for f/u. Referral received for medication needs- pt states that he could not afford his coumadin- this [facility] and is a $3 med. - pt is not eligible for assistance as he has insurance- pt uses [facility], [facility] or [facility]-  CSW to see pt regarding transportation needs- no further CM needs noted.   Expected Discharge Date:      07/09/16            Expected Discharge Plan:  Home/Self Care  In-House Referral:  Clinical Social Work  Discharge planning Services  CM Consult, Medication Assistance  Post Acute Care Choice:    Choice offered to:     DME Arranged:    DME Agency:     HH Arranged:    HH Agency:     Status of Service:  Completed, signed off  If discussed at [employer] Meetings, dates discussed:    Additional Comments:  Darrold SpanWebster, Disa Riedlinger Hall, RN 07/09/2016, 10:33 AM

## 2016-07-09 NOTE — Discharge Instructions (Signed)
Take Coumadin 7.5 mg daily as instructed.   Please call and make an appointment with [facility] for an INR check on Monday (07/14/2016).   Start using nicotine patch for smoking cessation as instructed.

## 2016-07-09 NOTE — Hospital Discharge Follow-Up (Signed)
Transitional Care Clinic at [place]:  This Case Manager spoke with Marvetta Gibbons, RN CM, and it was determined patient may benefit from the [place] [facility] [place]. Met with patient at bedside and informed him of the case management and medical management services provided at the [facility]. Patient uncertain if he wants to follow-up with the [facility]. Patient indicated right now he is concerned about securing a place at a shelter in [place], [place] and is aware he will need to attempt to obtain an appointment at [facility] on 07/14/16. Informed patient that there are currently no appointments available, but patient aware to call in case appointment becomes available. Stressed importance of getting discharge medications filled as soon as possible. Informed patient that this Case Manager will call him on 07/10/16 to determine decision about [facility]. Patient indicated [phone] is best number to reach him. Marvetta Gibbons, RN CM updated.

## 2016-07-09 NOTE — Progress Notes (Signed)
Pt has been discharged. IV and telemetry box were removed. Pt received discharge instructions and all questions were answered. Pt received 2 bus passes and money to [place] [facility] [place] [facility]. Pt was also provided with a printed copy of bus route and departure times. Pt left with all of his belongings. Pt was instructed to follow [facility]. Pt was also instructed to pick up his prescriptions from the [facility]. Pt verbalized understanding. Pt ambulated off the unit and was accompanied by his significant other. Pt was instructed to pick up his [facility] from [facility] security at the ED entrance. Pt verbalized understanding.   Berdine DanceLauren Moffitt BSN, RN

## 2016-07-10 ENCOUNTER — Telehealth: Payer: Self-pay

## 2016-07-10 MED FILL — Verapamil HCl IV Soln 2.5 MG/ML: INTRAVENOUS | Qty: 2 | Status: AC

## 2016-07-10 MED FILL — Lidocaine HCl Local Preservative Free (PF) Inj 1%: INTRAMUSCULAR | Qty: 30 | Status: AC

## 2016-07-10 NOTE — Discharge Summary (Signed)
Name: Timothy BosworthByron G Gibb MRN: [id number] DOB: 08/23/1960 55 y.o. PCP: Doris Cheadleeepak Advani, MD  Date of Admission: 07/07/2016  8:24 PM Date of Discharge: 07/10/2016 Attending Physician: No att. providers found  Discharge Diagnosis: 1. Nonanginal chest pain 2. History of pulmonary embolism and presumed embolic CVA Principal Problem:   Chest pain with moderate risk for cardiac etiology Active Problems:   Tobacco abuse   CAD S/P percutaneous coronary angioplasty 02/21/12 non-STEMI -Promus DES 4.0 mm 20 mm to prox LAD   History of arterial ischemic stroke   Malnutrition of moderate degree (HCC)   Cocaine abuse   Essential hypertension   Discharge Medications:   Medication List    TAKE these medications   aspirin EC 81 MG tablet Take 1 tablet (81 mg total) by mouth daily.   atorvastatin 80 MG tablet Commonly known as:  LIPITOR Take 1 tablet (80 mg total) by mouth at bedtime.   isosorbide dinitrate 20 MG tablet Commonly known as:  ISORDIL Take 1 tablet (20 mg total) by mouth 2 (two) times daily.   lisinopril 5 MG tablet Commonly known as:  PRINIVIL,ZESTRIL Take 1 tablet (5 mg total) by mouth daily.   mometasone-formoterol 100-5 MCG/ACT Aero Commonly known as:  DULERA Inhale 2 puffs into the lungs 2 (two) times daily.   nicotine 14 mg/24hr patch Commonly known as:  NICODERM CQ Place 1 patch (14 mg total) onto the skin daily.   warfarin 5 MG tablet Commonly known as:  COUMADIN Take 1.5 tablets (7.5 mg total) by mouth daily.       Disposition and follow-up:   Mr.Timothy Peck [facility] Good [facility].  At the hospital follow up visit please address:  1.  Please ensure the patient is taking his warfarin as prescribed. Please check his INR.  2.  Labs / imaging needed at time of follow-up: INR  3.  Pending labs/ test needing follow-up: None  Follow-up Appointments: Follow-up Information    [facility]  Follow up.   Why:  Please call and make an appointment for an INR check on 07/14/2016.  Contact information: [address] [place] [phone]          Hospital Course by problem list: Principal Problem:   Chest pain with moderate risk for cardiac etiology Active Problems:   Tobacco abuse   CAD S/P percutaneous coronary angioplasty 02/21/12 non-STEMI -Promus DES 4.0 mm 20 mm to prox LAD   History of arterial ischemic stroke   Malnutrition of moderate degree (HCC)   Cocaine abuse   Essential hypertension   1. Non-anginal chest pain  The patient [facility] to the [facility] department on 07/07/2016 with chest pain. Importantly he was recently admitted several days earlier for the same complaint. At his prior hospitalization cardiology was consulted and evaluated the patient and he had a nuclear stress test performed. The results of that test showed the patient had intermediate risk. At that time he was scheduled for heart catheterization but left the hospital AGAINST MEDICAL ADVICE over the weekend. He returned on 07/07/2016 with ongoing chest pain. He was again admitted to the hospital and cardiology evaluated the patient. During his admission a heart catheterization was performed which did not show a reason for the patient's chest pain. It was thought that this pain was non-anginal in etiology and may represent musculoskeletal pain among other potential causes. He'll be discharged on aspirin 81 mg  atorvastatin 80 mg.  2. History of PE and CVA  Per chart review the patient has had a single episode of bilateral pulmonary embolism in 2014 for which he was prescribed Xarelto. However at his next health encounter he was noted to be noncompliant. Return to the hospital in February 2015 was found to have a new CVA which was thought to possibly be embolic in nature. At that hospitalization he was discharged on warfarin. At that time he never followed up  for monitoring of his warfarin. At this hospitalization cardiology recommended that the patient be restarted on warfarin. He was started on warfarin and will not need a bridge. After consultation with pharmacy the patient was discharged on warfarin 7.5 mg once daily. He was instructed to follow-up in the outpatient setting to have his INR checked and his medication adjusted. We discussed with the patient at length that he must follow-up and that if he did not taking this medication could be dangerous. The patient endorsed understanding of this and wanted to try taking this medication anyway. He is being restarted for his prior history of a possibly embolic CVA. Discharge Vitals:   BP (!) 132/93 (BP Location: Left Arm)   Pulse 83   Temp 98 F (36.7 C) (Oral)   Resp 18   Ht 6\' 1"  (1.854 m)   Wt 169 lb 11.2 oz (77 kg)   SpO2 96%   BMI 22.39 kg/m   Pertinent Labs, Studies, and Procedures:  1. Heart catheterization-no evidence to explain the patient's chest pain    Signed: Thomasene LotJames Genean Adamski, MD 07/10/2016, 12:59 PM   Pager: [phone]

## 2016-07-10 NOTE — Telephone Encounter (Signed)
This Case Manager placed call to patient to remind patient of the case management, medical management services provided with the [facility]. Patient agreeable follow-up with the Transitional Care Clinic.  Transitional Care Clinic Post-discharge Follow-Up Phone Call:  Date of Discharge: 07/09/16 Principal Discharge Diagnosis(es): Chest pain, history of coronary artery disease, asthma, history of PE and CVA Call Completed: No                    With Whom: Patient    Please check all that apply:  X  Patient is knowledgeable of his/her condition(s) and/or treatment. X  Patient is caring for self at home.  ? Patient is receiving assist at home from family and/or caregiver. Family and/or caregiver is knowledgeable of patient's condition(s) and/or treatment. ? Patient is receiving home health services. If so, name of agency.     Medication Reconciliation:  ? Medication list reviewed with patient. X  Patient obtained all discharge medications-No. Patient has not yet picked up his discharge medications. Stressed importance of picking up medications today; however, patient indicated he would not be able to pick up medications today as he is working on Tax advisersecuring [place]. Patient indicated he will pick up medications tomorrow morning. Unable to review patient's entire medication list with him as patient indicated he was in the middle of trying to secure housing. In addition, patient indicated he needed to get off the phone because he was on a "minutes phone."  Spoke with Linna Hoffeyonna, Pharmacy Tech at [facility], and patient's discharge medications will be ready for pick-up when he comes to pharmacy tomorrow.  Activities of Daily Living:  X  Independent ? Needs assist  ? Total Care   Community resources in place for patient: Uncertain-unable to discuss as patient needed to get off of the phone.  ?  None  ? Home Health/Home DME ? Assisted Living ? Support Group     Questions/Concerns discussed: Patient agreeable to follow-up with the Transitional Care Clinic. Stressed importance of picking up his medications as soon as possible and also of adhering to his medical appointments as his INR will have to be monitored very closely. Patient verbalized understanding. Patient agreeable to an appointment on 07/14/16 at 0900, and patient indicated he plans to take the [employer] bus to get to the clinic. Appointment scheduled. Unable to complete call as patient indicated he needed to continue working on securing housing. Patient requested this Case Manager contact him again on 07/11/16. Will attempt to reach patient at a later time.

## 2016-07-11 ENCOUNTER — Telehealth: Payer: Self-pay

## 2016-07-11 NOTE — Telephone Encounter (Signed)
This Case Manager placed call an additional call to patient. Inquired if patient picked up discharge medications. Patient indicated he picked up his discharge medications today and changed his appointment from 07/14/16 at 0900 to 07/16/16 at 0900 with Dr. Venetia NightAmao. Informed him that discharging provider wanted him to follow-up on 07/14/16 for follow-up appointment and INR. Offered patient an appointment on 07/14/16 at 1400; however, patient declined and said he will not be able to make it to an appointment on 07/14/16, and he wants to keep appointment on 07/16/16 at 0900. Attempted to complete Transitional Care Clinic assessment; however, patient in a hurry to get off phone.

## 2016-07-15 ENCOUNTER — Telehealth: Payer: Self-pay

## 2016-07-15 NOTE — Telephone Encounter (Signed)
Call placed to the patient to remind him of [facility] @ 0900 and to check on his status.  He confirmed his appointment and said that he was working on transportation. Informed him that if he needs transportation to the clinic, he needs to call the [facility] by 1700 today so that a cab can be arranged. He said that he is currently working on getting shelter for he and his finace.  He said that his fiance goes everywhere with him. He noted that they slept on the street last night and he is hoping to get to a shelter [place]. He said that the shelter will help him find housing, however he is not sure how he would get to the shelter [place].  Informed him that cab transportation to the [facility] will only be provided in [place] and he said he understood. Also informed him that there is a SW at the [facility] who may be able to assist him with housing resources. He was appreciative of the information. He said that there [facility] availability [facility] last night.   When asked how he is doing, he said he is " making it" and has all of his medications and has been taking them as ordered. He denied any signs/symptoms of bleeding. Stressed with him the importance of coming to the appointment tomorrow as he needs an INR done. Explained to him why the INR is important and he said he understood. He was very pre-occupied with working on securing housing.  No other questions/concerns reported at this time.

## 2016-07-16 ENCOUNTER — Encounter: Payer: Self-pay | Admitting: Family Medicine

## 2016-07-16 ENCOUNTER — Ambulatory Visit: Payer: Medicaid Other | Attending: Family Medicine | Admitting: Family Medicine

## 2016-07-16 ENCOUNTER — Ambulatory Visit (HOSPITAL_COMMUNITY)
Admission: RE | Admit: 2016-07-16 | Discharge: 2016-07-16 | Disposition: A | Discharge status: Home / Self Care | Payer: Medicaid Other | Source: Ambulatory Visit | Attending: Family Medicine | Admitting: Family Medicine

## 2016-07-16 ENCOUNTER — Telehealth: Payer: Self-pay

## 2016-07-16 VITALS — BP 125/82 | HR 51 | Temp 98.1°F | Ht 76.5 in | Wt 177.6 lb

## 2016-07-16 DIAGNOSIS — I2510 Atherosclerotic heart disease of native coronary artery without angina pectoris: Secondary | ICD-10-CM | POA: Diagnosis not present

## 2016-07-16 DIAGNOSIS — M5135 Other intervertebral disc degeneration, thoracolumbar region: Secondary | ICD-10-CM | POA: Insufficient documentation

## 2016-07-16 DIAGNOSIS — Z8673 Personal history of transient ischemic attack (TIA), and cerebral infarction without residual deficits: Secondary | ICD-10-CM | POA: Diagnosis not present

## 2016-07-16 DIAGNOSIS — Z9861 Coronary angioplasty status: Secondary | ICD-10-CM

## 2016-07-16 DIAGNOSIS — G4733 Obstructive sleep apnea (adult) (pediatric): Secondary | ICD-10-CM | POA: Insufficient documentation

## 2016-07-16 DIAGNOSIS — I2782 Chronic pulmonary embolism: Secondary | ICD-10-CM | POA: Diagnosis not present

## 2016-07-16 DIAGNOSIS — I10 Essential (primary) hypertension: Secondary | ICD-10-CM | POA: Diagnosis not present

## 2016-07-16 DIAGNOSIS — Z9114 Patient's other noncompliance with medication regimen: Secondary | ICD-10-CM

## 2016-07-16 DIAGNOSIS — Z7901 Long term (current) use of anticoagulants: Secondary | ICD-10-CM | POA: Diagnosis not present

## 2016-07-16 DIAGNOSIS — Z7982 Long term (current) use of aspirin: Secondary | ICD-10-CM | POA: Diagnosis not present

## 2016-07-16 DIAGNOSIS — I2699 Other pulmonary embolism without acute cor pulmonale: Secondary | ICD-10-CM | POA: Insufficient documentation

## 2016-07-16 DIAGNOSIS — I252 Old myocardial infarction: Secondary | ICD-10-CM | POA: Diagnosis not present

## 2016-07-16 DIAGNOSIS — Z9119 Patient's noncompliance with other medical treatment and regimen: Secondary | ICD-10-CM | POA: Insufficient documentation

## 2016-07-16 DIAGNOSIS — Z79899 Other long term (current) drug therapy: Secondary | ICD-10-CM | POA: Diagnosis not present

## 2016-07-16 LAB — POCT INR: INR: 1.1

## 2016-07-16 MED ORDER — ATORVASTATIN CALCIUM 80 MG PO TABS: 80.0000 mg | ORAL_TABLET | Freq: Every day | ORAL | 3 refills | Status: DC

## 2016-07-16 MED ORDER — ISOSORBIDE DINITRATE 20 MG PO TABS: 20.0000 mg | ORAL_TABLET | Freq: Two times a day (BID) | ORAL | 3 refills | Status: DC

## 2016-07-16 MED ORDER — WARFARIN SODIUM 5 MG PO TABS: 7.5000 mg | ORAL_TABLET | Freq: Every day | ORAL | 1 refills | Status: DC

## 2016-07-16 MED FILL — WARFARIN SODIUM 5 MG TABLET: 5 | 30 days supply | Qty: 45 | Fill #0

## 2016-07-16 MED FILL — ISOSORBIDE DN 20 MG TABLET: 20 | 30 days supply | Qty: 60 | Fill #0

## 2016-07-16 MED FILL — ATORVASTATIN 80 MG TABLET: 80 | 30 days supply | Qty: 30 | Fill #0

## 2016-07-16 NOTE — Progress Notes (Signed)
Transitional care clinic   Date of telephone encounter: 07/10/16  Hospitalization dates: 07/07/16 through 07/10/16  Subjective:  Patient ID: Timothy BosworthByron G Sweetin, male    DOB: 06/14/1961  Age: 55 y.o. 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 His Coumadin was restarted (as he had been off anticoagulation - previously on Xarelto on which he was non compliant) due to previous history of CVA and PE.  Today his INR is 1.1 and he informs me he has been taking 5 mg of Coumadin daily rather than 7.5 mg which she was discharged on. He complains of low back pain and informs me he would like to be referred to spine specialist as he was previously scheduled for back surgery but the plans fell through.   Past Medical History:  Diagnosis Date  . Asthma   . Bilateral pulmonary embolism [facility](HCC) August 2014  . CAD S/P percutaneous coronary angioplasty 02/2012   NSTEMI - PCI to prox LAD  . Chronic anticoagulation December 2014   Switch from Xarelto to warfarin.  . Degenerative disc disease, cervical    Dx years ago  . Depression   . 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 Hypertension   . NSTEMI (non-ST elevated myocardial infarction) (HCC)    07/18/13, stable cath with patent stent; preserved EF by echo; follow-up Echo for CVA 09/2013: EF 60%, mild LVH. No RDW and a. No cardiac source for embolism  noted. Bubble study was not performed  . NSTEMI (non-ST elevated myocardial infarction), 07/18/13, stable cath with patent stent. 01/31/2012  . OSA (obstructive sleep apnea)    Diagnosis long ago, used to use CPAP, not currently on any treatment.  . Polysubstance abuse    Past history of cocaine and marijuana, both smoking. Also long standing tobacco abuse.   . Presence of drug coated stent in LAD coronary artery 02/2012   Promus Element DES 4.0 mm x 28 mm; converted from Brilinta to aspirin during stroke evaluation in February 2015    Past Surgical History:  Procedure Laterality Date  . ANTERIOR CERVICAL DECOMP/DISCECTOMY FUSION  11/28/2011   Procedure: ANTERIOR CERVICAL DECOMPRESSION/DISCECTOMY FUSION 1 LEVEL;  Surgeon: Temple PaciniHenry A Pool, MD;  Location: [facility] NEURO ORS;  Service: Neurosurgery;  Laterality: N/A;  Anterior Cervical Six-Seven Decompression and Fusion  . CARDIAC CATHETERIZATION N/A 07/08/2016   Procedure: Left Heart Cath and Coronary Angiography;  Surgeon: Marykay Lexavid W Harding, MD;  [facility];  Service: Cardiovascular;  Laterality: N/A;  . LEFT HEART CATHETERIZATION WITH CORONARY ANGIOGRAM N/A 01/31/2012   Procedure: LEFT HEART CATHETERIZATION WITH CORONARY ANGIOGRAM;  Surgeon: Runell GessJonathan J Berry, MD;  [facility];  Service: Cardiovascular: NSTEMI -  p-mLAD ~80% thrombotic lesion -> treated with [id number] Inhibitor & staged PCI  . LEFT HEART CATHETERIZATION WITH CORONARY ANGIOGRAM N/A 12/17/2012   Procedure: LEFT HEART CATHETERIZATION WITH CORONARY ANGIOGRAM;  Surgeon: Marykay Lexavid W Harding, MD;  [facility];  Service: Cardiovascular: Patent LAD DES.  CP thought to be Non-anginal  . LEFT HEART CATHETERIZATION WITH CORONARY ANGIOGRAM N/A 07/18/2013   Procedure: LEFT HEART CATHETERIZATION WITH CORONARY ANGIOGRAM;  Surgeon: Marykay Lex, MD;  Location: [facility] CATH LAB;  Service: Cardiovascular: patent stent, stable CAD.  ? Sx related to Myopericarditis.  Marland Kitchen PERCUTANEOUS  CORONARY STENT INTERVENTION (PCI-S) N/A 02/02/2012   Procedure: PERCUTANEOUS CORONARY STENT INTERVENTION (PCI-S);  Surgeon: Marykay Lex, MD;  Location: [facility] CATH LAB;  Service: Cardiovascular: Staged LAD PCI - Promus DES 4x28.    . TUMOR REMOVAL     left foot third toe    Allergies  Allergen Reactions  . Ibuprofen Other (See Comments)    Doctor told him he could not take     Outpatient Medications Prior to Visit  Medication Sig Dispense Refill  . mometasone-formoterol (DULERA) 100-5 MCG/ACT AERO Inhale 2 puffs into the lungs 2 (two) times daily. 1 Inhaler 5  . nicotine (NICODERM CQ) 14 mg/24hr patch Place 1 patch (14 mg total) onto the skin daily. 28 patch 0  . warfarin (COUMADIN) 5 MG tablet Take 1.5 tablets (7.5 mg total) by mouth daily. 9 tablet 0  . aspirin EC 81 MG tablet Take 1 tablet (81 mg total) by mouth daily. (Patient not taking: Reported on 07/16/2016) 90 tablet 3  . lisinopril (PRINIVIL,ZESTRIL) 5 MG tablet Take 1 tablet (5 mg total) by mouth daily. (Patient not taking: Reported on 07/16/2016) 30 tablet 11  . atorvastatin (LIPITOR) 80 MG tablet Take 1 tablet (80 mg total) by mouth at bedtime. (Patient not taking: Reported on 07/16/2016) 30 tablet 11  . isosorbide dinitrate (ISORDIL) 20 MG tablet Take 1 tablet (20 mg total) by mouth 2 (two) times daily. (Patient not taking: Reported on 07/16/2016) 60 tablet 11   No facility-administered medications prior to visit.     ROS Review of Systems  Constitutional: Negative for activity change and appetite change.  HENT: Negative for sinus pressure and sore throat.   Eyes: Negative for visual disturbance.  Respiratory: Negative for cough, chest tightness and shortness of breath.   Cardiovascular: Negative for chest pain and leg swelling.  Gastrointestinal: Negative for abdominal distention, abdominal pain, constipation and diarrhea.  Endocrine: Negative.   Genitourinary: Negative for dysuria.  Musculoskeletal: Positive for back  pain. Negative for joint swelling and myalgias.  Skin: Negative for rash.  Allergic/Immunologic: Negative.   Neurological: Negative for weakness, light-headedness and numbness.  Psychiatric/Behavioral: Negative for dysphoric mood and suicidal ideas.    Objective:  BP 125/82 (BP Location: Right Arm, Patient Position: Sitting, Cuff Size: Large)   Pulse (!) 51   Temp 98.1 F (36.7 C) (Oral)   Ht 6' 4.5" (1.943 m)   Wt 177 lb 9.6 oz (80.6 kg)   SpO2 98%   BMI 21.34 kg/m   BP/Weight 07/16/2016 07/09/2016 07/08/2016  Systolic BP 125 132 -  Diastolic BP 82 93 -  Wt. (Lbs) 177.6 - 169.7  BMI 21.34 - -  Some encounter information is confidential and restricted. Go to Review Flowsheets activity to see all data.      Physical Exam  Constitutional: He is oriented to person, place, and time. He appears well-developed and well-nourished.  Cardiovascular: Normal rate, normal heart sounds and intact distal pulses.   No murmur heard. Pulmonary/Chest: Effort normal and breath sounds normal. He has no wheezes. He has no rales. He exhibits no tenderness.  Abdominal: Soft. Bowel sounds are normal. He exhibits no  distension and no mass. There is no tenderness.  Musculoskeletal: He exhibits tenderness (tenderness on palpation of the thoracolumbar spine).  Neurological: He is alert and oriented to person, place, and time.  Skin: Skin is warm and dry.  Psychiatric: He has a normal mood and affect.     Assessment & Plan:   1. Other chronic pulmonary embolism without acute cor pulmonale (HCC) Subtherapeutic INR of 1.1 due to patient not taking Coreg dose of Coumadin. - INR - warfarin (COUMADIN) 5 MG tablet; Take 1.5 tablets (7.5 mg total) by mouth daily.  Dispense: 45 tablet; Refill: 1  2. Noncompliance with medications Stressed compliance and implications of noncompliance including complications or medical condition and he is willing to be more compliant  3. Essential  hypertension Controlled Continue antihypertensives  4. CAD S/P percutaneous coronary angioplasty 02/21/12 non-STEMI -Promus DES 4.0 mm 20 mm to prox LAD No angina Risk factor modification - isosorbide dinitrate (ISORDIL) 20 MG tablet; Take 1 tablet (20 mg total) by mouth 2 (two) times daily.  Dispense: 60 tablet; Refill: 3 - atorvastatin (LIPITOR) 80 MG tablet; Take 1 tablet (80 mg total) by mouth at bedtime.  Dispense: 30 tablet; Refill: 3  5. H/o ischemic right ica stroke No residual deficits  6. DDD (degenerative disc disease), thoracolumbar He has had a history of cervical spine surgery. He will need lumbar spine MRI down the road and referral to a spine specialist but we will start with lumbar spine x-ray due to insurance issues. - DG THORACOLUMABAR SPINE; Future   Meds ordered this encounter  Medications  . isosorbide dinitrate (ISORDIL) 20 MG tablet    Sig: Take 1 tablet (20 mg total) by mouth 2 (two) times daily.    Dispense:  60 tablet    Refill:  3  . atorvastatin (LIPITOR) 80 MG tablet    Sig: Take 1 tablet (80 mg total) by mouth at bedtime.    Dispense:  30 tablet    Refill:  3  . warfarin (COUMADIN) 5 MG tablet    Sig: Take 1.5 tablets (7.5 mg total) by mouth daily.    Dispense:  45 tablet    Refill:  1    Follow-up: Return in about 2 weeks (around 07/30/2016) for TCC - follow up of pulmonary embolism.   Jaclyn ShaggyEnobong Amao MD

## 2016-07-16 NOTE — Telephone Encounter (Signed)
Met with the patient when he was at the clinic today for his appointment. He stated that he is no longer planning to move to [facility] and has decided to stay in [place]. He noted that the only support that he has is his fiance. [facility] and [facility] working with the [facility] to try to secure shelter. He noted that he receives disability  - approximately $700/month.  He had food stamps but they have expired and he needs to re-apply and understands that he needs to go to DSS to re-apply. Provided him with the phone # to call for a transportation assessment for medicaid transportation. - # [phone]. He stated that he has limited minutes on his phone but will try to call from the [facility] or check with DSS when he re-applies for food stamps.   Provided him with an application for SCAT. He said that he would complete it later today and return for provider signature.   Provided him with a bus pass when he was ready to leave the clinic.

## 2016-07-17 ENCOUNTER — Telehealth: Payer: Self-pay

## 2016-07-17 NOTE — Telephone Encounter (Signed)
Completed SCAT application faxed to # [phone]

## 2016-07-17 NOTE — Telephone Encounter (Signed)
-----   Message from Jaclyn ShaggyEnobong Amao, MD sent at 07/16/2016  1:57 PM EST ----- Lumbar spine x-ray reveals degenerative changes

## 2016-07-17 NOTE — Telephone Encounter (Signed)
Writer called patient to discuss radiology results per Dr. Venetia NightAmao.  LVM requesting a return call to discuss.

## 2016-07-18 ENCOUNTER — Telehealth: Payer: Self-pay | Admitting: Family Medicine

## 2016-07-18 ENCOUNTER — Telehealth: Payer: Self-pay | Admitting: Licensed Clinical Social Worker

## 2016-07-18 NOTE — Telephone Encounter (Signed)
Pt. Returned call. Please f/u °

## 2016-07-18 NOTE — Telephone Encounter (Signed)
[facility] returned pt's call. Pt stated that he is interested in housing resources; however, will call [facility] back due to limited minutes on his phone.

## 2016-07-18 NOTE — Telephone Encounter (Signed)
[facility] attempted to contact pt to follow up on behavioral health. [facility] left a message for a return call. 

## 2016-07-19 ENCOUNTER — Emergency Department (HOSPITAL_COMMUNITY)
Admission: EM | Admit: 2016-07-19 | Discharge: 2016-07-19 | Disposition: A | Discharge status: Home / Self Care | Payer: Medicaid Other | Attending: Emergency Medicine | Admitting: Emergency Medicine

## 2016-07-19 ENCOUNTER — Emergency Department (HOSPITAL_COMMUNITY): Payer: Medicaid Other

## 2016-07-19 ENCOUNTER — Encounter (HOSPITAL_COMMUNITY): Payer: Self-pay | Admitting: Emergency Medicine

## 2016-07-19 DIAGNOSIS — R51 Headache: Secondary | ICD-10-CM | POA: Insufficient documentation

## 2016-07-19 DIAGNOSIS — Z7901 Long term (current) use of anticoagulants: Secondary | ICD-10-CM | POA: Diagnosis not present

## 2016-07-19 DIAGNOSIS — J45909 Unspecified asthma, uncomplicated: Secondary | ICD-10-CM | POA: Insufficient documentation

## 2016-07-19 DIAGNOSIS — Z79899 Other long term (current) drug therapy: Secondary | ICD-10-CM | POA: Insufficient documentation

## 2016-07-19 DIAGNOSIS — F17210 Nicotine dependence, cigarettes, uncomplicated: Secondary | ICD-10-CM | POA: Insufficient documentation

## 2016-07-19 DIAGNOSIS — I252 Old myocardial infarction: Secondary | ICD-10-CM | POA: Insufficient documentation

## 2016-07-19 DIAGNOSIS — R079 Chest pain, unspecified: Secondary | ICD-10-CM | POA: Diagnosis present

## 2016-07-19 DIAGNOSIS — R519 Headache, unspecified: Secondary | ICD-10-CM

## 2016-07-19 DIAGNOSIS — Z7982 Long term (current) use of aspirin: Secondary | ICD-10-CM | POA: Diagnosis not present

## 2016-07-19 DIAGNOSIS — I2510 Atherosclerotic heart disease of native coronary artery without angina pectoris: Secondary | ICD-10-CM | POA: Insufficient documentation

## 2016-07-19 DIAGNOSIS — R0789 Other chest pain: Secondary | ICD-10-CM | POA: Insufficient documentation

## 2016-07-19 LAB — COMPREHENSIVE METABOLIC PANEL
ALT: 25 U/L (ref 17–63)
AST: 29 U/L (ref 15–41)
Albumin: 4 g/dL (ref 3.5–5.0)
Alkaline Phosphatase: 50 U/L (ref 38–126)
Anion gap: 6 (ref 5–15)
BILIRUBIN TOTAL: 0.9 mg/dL (ref 0.3–1.2)
BUN: 13 mg/dL (ref 6–20)
CALCIUM: 9 mg/dL (ref 8.9–10.3)
CO2: 26 mmol/L (ref 22–32)
CREATININE: 1.38 mg/dL — AB (ref 0.61–1.24)
Chloride: 109 mmol/L (ref 101–111)
GFR calc Af Amer: >60 mL/min (ref >60)
GFR, EST NON AFRICAN AMERICAN: 57 mL/min — AB (ref >60)
Glucose, Bld: 78 mg/dL (ref 65–99)
Potassium: 4.1 mmol/L (ref 3.5–5.1)
Sodium: 141 mmol/L (ref 135–145)
TOTAL PROTEIN: 6.5 g/dL (ref 6.5–8.1)

## 2016-07-19 LAB — PROTIME-INR
INR: 1.17
PROTHROMBIN TIME: 14.9 s (ref 11.4–15.2)

## 2016-07-19 LAB — URINALYSIS, ROUTINE W REFLEX MICROSCOPIC
BILIRUBIN URINE: NEGATIVE
Bacteria, UA: NONE SEEN
Glucose, UA: NEGATIVE mg/dL
Ketones, ur: NEGATIVE mg/dL
LEUKOCYTES UA: NEGATIVE
NITRITE: NEGATIVE
PH: 7 (ref 5.0–8.0)
Protein, ur: NEGATIVE mg/dL
SPECIFIC GRAVITY, URINE: 1.018 (ref 1.005–1.030)
SQUAMOUS EPITHELIAL / LPF: NONE SEEN

## 2016-07-19 LAB — CBC
HEMATOCRIT: 39.3 % (ref 39.0–52.0)
Hemoglobin: 13.4 g/dL (ref 13.0–17.0)
MCH: 31.3 pg (ref 26.0–34.0)
MCHC: 34.1 g/dL (ref 30.0–36.0)
MCV: 91.8 fL (ref 78.0–100.0)
PLATELETS: 227 10*3/uL (ref 150–400)
RBC: 4.28 MIL/uL (ref 4.22–5.81)
RDW: 13.4 % (ref 11.5–15.5)
WBC: 9.1 10*3/uL (ref 4.0–10.5)

## 2016-07-19 LAB — I-STAT TROPONIN, ED
Troponin i, poc: 0.01 ng/mL (ref 0.00–0.08)
Troponin i, poc: 0 ng/mL (ref 0.00–0.08)

## 2016-07-19 LAB — RAPID URINE DRUG SCREEN, HOSP PERFORMED
Amphetamines: NOT DETECTED
BARBITURATES: NOT DETECTED
Benzodiazepines: NOT DETECTED
Cocaine: POSITIVE — AB
Opiates: NOT DETECTED
TETRAHYDROCANNABINOL: POSITIVE — AB

## 2016-07-19 LAB — LIPASE, BLOOD: Lipase: 15 U/L (ref 11–51)

## 2016-07-19 MED ORDER — MORPHINE SULFATE (PF) 4 MG/ML IV SOLN
4.0000 mg | Freq: Once | INTRAVENOUS | Status: DC
  Filled 2016-07-19: qty 1

## 2016-07-19 NOTE — Discharge Instructions (Signed)
Please follow up with your primary care provider.

## 2016-07-19 NOTE — ED Triage Notes (Signed)
Pt to ER by [facility] from home with complaint of left chest pain onset this morning at 6 am, states woke him up. Describes pain as a pressure or heaviness, radiates into left neck and arm. Also reports frontal headache onset this morning at 6 am as well. Extensive cardiac hx. BP 140/81, 97% RA, 62 HR, NSR. EMS gave 324 mg aspirin and 2 nitro without any relief.

## 2016-07-19 NOTE — ED Notes (Signed)
Pt verbalizes understanding of discharge instructions, NAD. A/o x4. Wheeled to waiting room on departure.

## 2016-07-19 NOTE — ED Notes (Signed)
Pt transporting to CT, asked to take to xray after.

## 2016-07-19 NOTE — ED Notes (Signed)
Pt resting comfortably. Appears to be in no distress. Leaning over the bed eating chicken with girlfriend.

## 2016-07-19 NOTE — ED Notes (Signed)
4 mg morphine wasted in sharps witnessed by RN Cher NakaiLori Berdik. Unable to waste in pyxis due to patient discharge.

## 2016-07-19 NOTE — ED Notes (Signed)
Pt returned from xray/CT.

## 2016-07-19 NOTE — ED Provider Notes (Signed)
[facility]Y DEPT Provider Note   CSN: [id number] Arrival date & time: 07/19/16  9562    History   Chief Complaint Chief Complaint  Patient presents with  . Chest Pain    HPI Timothy Peck is a 55 y.o. male who presents with chest pain. PMH significant for multiple PEs on chronic anticoagulation (recently restarted on Warfarin), multiple NSTEMIs s/p stenting to proximal LAD, CAD, hx of CVA, hx of polysubstance abuse, HTN, hx of medical non-adherence, hx of schizophrenia. He was recently hospitalized from 11/29-12/1 (left AMA) and again from 12/4-12/6 when he underwent a repeat cath by Dr. Royann Shivers. Cath showed some mild in-stent restenosis, fixed inferioapical defect, small occluded distal PDA. It was felt that chest pain was likely to be nonanginal and also was not consistent with coronary vasospasms. Echo on 12/1 showed EF 65-70%.Cardiologist is Dr. Herbie Baltimore. Last used Cocaine on 12/7. Smoke Marijuana last night. He is homeless.   Patient states that he acutely woke up with a sharp, stabbing, severe headache which starts in the front and radiates to the left side of his head. He reports associated numbness of the right side of his face and upper extremity. Also has some weakness of the right arm. Reports dizziness with standing. States he feels like he is going to pass out and like the room is spinning. Ambulates at baseline with a cane. Reports blurry vision.  He also is having chest pain. It is substernal and also radiates to the left side of his chest. It is constant and feels like a pressure. Also having some tingling in the left arm and jaw. Reports associated SOB and productive cough.  Also having abdominal pain. It is diffuse, constant, associated with diarrhea. Denies N/V, or urinary symptoms. Reports decreased PO intake. He has been sleeping on the floor at [facility].  Patient called EMS and received SL nitro x 2 and 324mg  ASA.  HPI  Past Medical History:  Diagnosis Date  . Asthma    . Bilateral pulmonary embolism [facility]) August 2014  . CAD S/P percutaneous coronary angioplasty 02/2012   NSTEMI - PCI to prox LAD  . Chronic anticoagulation December 2014   Switch from Xarelto to warfarin.  . Degenerative disc disease, cervical    Dx years ago  . Depression   . 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 Hypertension   . NSTEMI (non-ST elevated myocardial infarction) (HCC)    07/18/13, stable cath with patent stent; preserved EF by echo; follow-up Echo for CVA 09/2013: EF 60%, mild LVH. No RDW and a. No cardiac source for embolism noted. Bubble study was not performed  . NSTEMI (non-ST elevated myocardial infarction), 07/18/13, stable cath with patent stent. 01/31/2012  . OSA (obstructive sleep apnea)    Diagnosis long ago, used to use CPAP, not currently on any treatment.  . Polysubstance abuse    Past history of cocaine and marijuana, both smoking. Also long standing tobacco abuse.   . Presence of drug coated stent in LAD coronary artery 02/2012   Promus Element DES 4.0 mm x 28 mm; converted from Brilinta to aspirin during stroke evaluation in February 2015    Patient Active Problem List   Diagnosis Date Noted  . DDD (degenerative disc disease), thoracolumbar 07/16/2016  . Essential hypertension 04/21/2016  . Cocaine abuse   . Left-sided weakness   . Shortness of breath   . Chest pain with moderate risk for cardiac etiology 10/04/2014  . Malnutrition of moderate degree (HCC) 09/22/2014  .  Noncompliance with medications 09/21/2014  . H/o ischemic right ica stroke 09/20/2014  . 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 Long term current use of anticoagulant therapy 10/11/2013  . Acute ischemic stroke (HCC) 09/21/2013  . Chronic anticoagulation, since 03/2013 for PE 07/19/2013  . Chest pain, musculoskeletal 07/18/2013  . Pulmonary embolism and infarction, 03/13/13, No longeron warfarin 03/14/2013  . Acute respiratory  failure with hypoxia (HCC) 03/14/2013  . Blood in stool, resolved 01/11/2013  . CAD S/P percutaneous coronary angioplasty 02/21/12 non-STEMI -Promus DES 4.0 mm 20 mm to prox LAD 12/14/2012    Class: Diagnosis of  . Dyslipidemia, goal LDL below 70 12/14/2012  . Bradycardia 02/03/2012  . NSTEMI (non-ST elevated myocardial infarction), 07/18/13, stable cath with patent stent. 01/31/2012  . Substance abuse 01/31/2012  . Alcohol abuse 01/31/2012  . Tobacco abuse 01/31/2012  . Herniation of cervical intervertebral disc with radiculopathy 11/28/2011    Past Surgical History:  Procedure Laterality Date  . ANTERIOR CERVICAL DECOMP/DISCECTOMY FUSION  11/28/2011   Procedure: ANTERIOR CERVICAL DECOMPRESSION/DISCECTOMY FUSION 1 LEVEL;  Surgeon: Temple Pacini, MD;  Location: [facility] NEURO [facility]S;  Service: Neurosurgery;  Laterality: N/A;  Anterior Cervical Six-Seven Decompression and Fusion  . CARDIAC CATHETERIZATION N/A 07/08/2016   Procedure: Left Heart Cath and Coronary Angiography;  Surgeon: Marykay Lex, MD;  Location: [facility] INVASIVE CV LAB;  Service: Cardiovascular;  Laterality: N/A;  . LEFT HEART CATHETERIZATION WITH CORONARY ANGIOGRAM N/A 01/31/2012   Procedure: LEFT HEART CATHETERIZATION WITH CORONARY ANGIOGRAM;  Surgeon: Runell Gess, MD;  Location: [facility] CATH LAB;  Service: Cardiovascular: NSTEMI -  p-mLAD ~80% thrombotic lesion -> treated with [id number] Inhibitor & staged PCI  . LEFT HEART CATHETERIZATION WITH CORONARY ANGIOGRAM N/A 12/17/2012   Procedure: LEFT HEART CATHETERIZATION WITH CORONARY ANGIOGRAM;  Surgeon: Marykay Lex, MD;  Location: [facility] CATH LAB;  Service: Cardiovascular: Patent LAD DES.  CP thought to be Non-anginal  . LEFT HEART CATHETERIZATION WITH CORONARY ANGIOGRAM N/A 07/18/2013   Procedure: LEFT HEART CATHETERIZATION WITH CORONARY ANGIOGRAM;  Surgeon: Marykay Lex, MD;  Location: [facility] CATH LAB;  Service: Cardiovascular: patent stent, stable CAD.  ? Sx related to Myopericarditis.  Marland Kitchen  PERCUTANEOUS CORONARY STENT INTERVENTION (PCI-S) N/A 02/02/2012   Procedure: PERCUTANEOUS CORONARY STENT INTERVENTION (PCI-S);  Surgeon: Marykay Lex, MD;  Location: [facility] CATH LAB;  Service: Cardiovascular: Staged LAD PCI - Promus DES 4x28.    . TUMOR REMOVAL     left foot third toe       Home Medications    Prior to Admission medications   Medication Sig Start Date End Date Taking? Authorizing Provider  aspirin EC 81 MG tablet Take 1 tablet (81 mg total) by mouth daily. Patient not taking: Reported on 07/16/2016 04/21/16   Marykay Lex, MD  atorvastatin (LIPITOR) 80 MG tablet Take 1 tablet (80 mg total) by mouth at bedtime. 07/16/16   Jaclyn Shaggy, MD  isosorbide dinitrate (ISORDIL) 20 MG tablet Take 1 tablet (20 mg total) by mouth 2 (two) times daily. 07/16/16   Jaclyn Shaggy, MD  lisinopril (PRINIVIL,ZESTRIL) 5 MG tablet Take 1 tablet (5 mg total) by mouth daily. Patient not taking: Reported on 07/16/2016 04/21/16   Marykay Lex, MD  mometasone-formoterol [facility]) 100-5 MCG/ACT AERO Inhale 2 puffs into the lungs 2 (two) times daily. 04/21/16   Marykay Lex, MD  nicotine (NICODERM CQ) 14 mg/24hr patch Place 1 patch (14 mg total) onto the skin daily.  07/09/16   John GiovanniVasundhra Rathore, MD  warfarin (COUMADIN) 5 MG tablet Take 1.5 tablets (7.5 mg total) by mouth daily. 07/16/16   Jaclyn ShaggyEnobong Amao, MD    Family History Family History  Problem Relation Age of Onset  . Coronary artery disease Father     died of MI  . Stroke Father 7860  . Heart disease Father   . Diabetes Father   . Aneurysm Mother 45    intracranial aneurysm rupture  . Hypertension Mother   . Hypertension Maternal Grandmother   . Diabetes Maternal Grandmother   . Hypertension Paternal Grandmother   . Hypertension Paternal Grandfather   . Cancer Maternal Aunt   . Anesthesia problems Neg Hx   . Hypotension Neg Hx   . Malignant hyperthermia Neg Hx   . Pseudochol deficiency Neg Hx     Social History Social History    Substance Use Topics  . Smoking status: Current Every Day Smoker    Packs/day: 1.00    Years: 42.00    Types: Cigarettes    Start date: 01/12/1971  . Smokeless tobacco: Never Used     Comment: 5-6 cigs daily  . Alcohol use No     Comment: Report being abstinent since ~January 2015     Allergies   Ibuprofen   Review of Systems Review of Systems  Constitutional: Negative for chills and fever.  Eyes: Positive for visual disturbance.  Respiratory: Positive for cough and shortness of breath. Negative for wheezing.   Cardiovascular: Positive for chest pain. Negative for palpitations and leg swelling.  Gastrointestinal: Positive for abdominal pain and diarrhea. Negative for nausea and vomiting.  Genitourinary: Negative for dysuria.  Musculoskeletal: Positive for back pain and gait problem.  Neurological: Positive for dizziness, weakness, light-headedness, numbness and headaches. Negative for seizures, syncope, facial asymmetry and speech difficulty.  All other systems reviewed and are negative.    Physical Exam Updated Vital Signs BP 127/95   Pulse (!) 57   Temp 97.8 F (36.6 C) (Oral)   Resp 13   SpO2 99%   Physical Exam  Constitutional: He is oriented to person, place, and time. He appears well-developed and well-nourished. No distress.  Thin male, NAD. Of note patient overall gives poor effort on exam and has many inconsistencies.   HENT:  Head: Normocephalic and atraumatic.  Eyes: Conjunctivae are normal. Pupils are equal, round, and reactive to light. Right eye exhibits no discharge. Left eye exhibits no discharge. No scleral icterus.  Neck: Normal range of motion.  Cardiovascular: Normal rate and regular rhythm.  Exam reveals no gallop and no friction rub.   No murmur heard. Pulmonary/Chest: Effort normal and breath sounds normal. No respiratory distress. He has no wheezes. He has no rales. He exhibits no tenderness.  Abdominal: Soft. Bowel sounds are normal. He  exhibits no distension and no mass. There is tenderness. There is no rebound and no guarding. No hernia.  Generalized tenderness. Pain out of proportion to exam  Neurological: He is alert and oriented to person, place, and time.  Mental Status:  Alert, oriented, thought content appropriate, able to give a coherent history. Speech fluent without evidence of aphasia. Able to follow 2 step commands without difficulty.  Cranial Nerves:  II:  Peripheral visual fields grossly normal, pupils equal, round, reactive to light III,IV, VI: ptosis not present. Patient will not follow my finger for EOM. V,VII: smile symmetric. Patient reports subjective numbness on right side of face VIII: hearing grossly normal to voice  X: uvula elevates symmetrically  XI: bilateral shoulder shrug symmetric and strong XII: midline tongue extension without fassiculations Motor:  Normal tone. 5/5 in upper and lower extremities on left side including strong and equal grip strength and dorsiflexion/plantar flexion. 4/5 in upper and lower extremities on right side (poor effort) Sensory: Pinprick and light touch normal in all extremities.  Cerebellar: abnormal finger-to-nose with bilateral upper extremities (poor effort). Will not perform RAM. Gait: unable to walk (patient states he is dizzy) CV: distal pulses palpable throughout    Skin: Skin is warm and dry.  Psychiatric: He has a normal mood and affect. His behavior is normal.  Nursing note and vitals reviewed.    ED Treatments / Results  Labs (all labs ordered are listed, but only abnormal results are displayed) Labs Reviewed - No data to display  EKG  EKG Interpretation  Date/Time:  Saturday July 19 2016 08:55:42 EST Ventricular Rate:  59 PR Interval:    QRS Duration: 83 QT Interval:  425 QTC Calculation: 421 R Axis:   75 Text Interpretation:  Sinus rhythm Biatrial enlargement ST elev, probable normal early repol pattern No significant change was found  Confirmed by CAMPOS  MD, KEVIN ([id number]) on 07/19/2016 9:11:58 AM       Radiology Dg Chest 2 View  Result Date: 07/19/2016 CLINICAL DATA:  Left side chest pain. EXAM: CHEST  2 VIEW COMPARISON:  07/07/2016 FINDINGS: Mild cardiomegaly. No focal airspace opacity, effusion or edema. No acute bony abnormality P IMPRESSION: Mild cardiomegaly.  No active disease. Electronically Signed   By: Charlett Nose M.D.   On: 07/19/2016 10:57   Ct Head Wo Contrast  Result Date: 07/19/2016 CLINICAL DATA:  Acute onset left-sided headache today. Previous stroke. EXAM: CT HEAD WITHOUT CONTRAST TECHNIQUE: Contiguous axial images were obtained from the base of the skull through the vertex without intravenous contrast. COMPARISON:  Brain MRI on 03/10/2016 FINDINGS: Brain: No evidence of acute infarction, hemorrhage, hydrocephalus, extra-axial collection or mass lesion/mass effect. Old right posterior parietal and occipital lobe infarcts are again seen. Vascular: No hyperdense vessel or unexpected calcification. Skull: Normal. Negative for fracture or focal lesion. Sinuses/Orbits: No acute finding. Other: None. IMPRESSION: No acute intracranial abnormality. Old right posterior parietal and occipital lobe infarcts. Electronically Signed   By: Myles Rosenthal M.D.   On: 07/19/2016 10:57    Procedures Procedures (including critical care time)  Medications Ordered in ED Medications  morphine 4 MG/ML injection 4 mg (0 mg Intravenous Hold 07/19/16 1022)    Initial Impression / Assessment and Plan / ED Course  I have reviewed the triage vital signs and the nursing notes.  Pertinent labs & imaging results that were available during my care of the patient were reviewed by me and considered in my medical decision making (see chart for details).  Clinical Course    55 year old male presents with pan-positive ROS. Patient is afebrile, not tachycardic or tachypneic, normotensive, and not hypoxic. He is bradycardic at times. CBC  unremarkable. CMP remarkable for SCr of 1.38 which is at baseline. INR is 1.17. Chest pain work up is reassuring. EKG is NSR and shows no significant change since last. CXR is negative. Initial and second troponin is 0. Recent cath did not show any major lesions. CT head unremarkable.   Suspect some degree of malingering as history is inconsistent and he gives poor effort on exam. Went in to discuss results with patient who was uninterested. Advised follow up with Dr. Venetia Night which  he agrees to. Opportunity for questions provided and all questions answered. Return precautions given.   Final Clinical Impressions(s) / ED Diagnoses   Final diagnoses:  Atypical chest pain  Nonintractable headache, unspecified chronicity pattern, unspecified headache type    New Prescriptions Current Discharge Medication List       Bethel BornKelly Marie Asley [place], PA-C 07/19/16 1413    Azalia BilisKevin Campos, MD 07/20/16 [phone]

## 2016-07-21 ENCOUNTER — Telehealth: Payer: Self-pay

## 2016-07-21 NOTE — Telephone Encounter (Signed)
Message received from [place]ie at [facility] requesting a call back to # [phone]. Call returned to [place]ie who stated that they need an address to process the application. Explained to her that the patient is homeless as noted on the application. She explained that they need to have an address on file noting where he needs to be picked up and they will then drop off at the same address. Informed her that this CM will attempt to contact him to inquire if he has an address to use for the application. As per Annice PihJackie, he can use the address of the [facility] if needed.    Attempted to contact the patient.  Call placed to # [phone] and a HIPAA compliant voicemail message was left requesting a call back to # [phone] or [phone].

## 2016-07-22 ENCOUNTER — Telehealth: Payer: Self-pay

## 2016-07-22 NOTE — Telephone Encounter (Signed)
-----   Message from Enobong Amao, MD sent at 07/16/2016  1:57 PM EST ----- Lumbar spine x-ray reveals degenerative changes 

## 2016-07-22 NOTE — Telephone Encounter (Signed)
Writer contacted patient about his spine xray per Dr. Venetia NightAmao.  Patient stated understanding.

## 2016-07-22 NOTE — Telephone Encounter (Signed)
Attempted to contact the patient to inquire if he has an address that can be used for his SCAT application.  Call placed to # [phone] ([phone] and a HIPAA compliant voicemail message was left requesting a call back to # [phone] or [phone].

## 2016-07-23 ENCOUNTER — Telehealth: Payer: Self-pay

## 2016-07-23 NOTE — Telephone Encounter (Signed)
This Case Manager placed call to patient to patient to determine if he has an address that can be used for his SCAT application. Patient indicated he would [facility] address to [facility] used for his application. Informed him he will need to contact Melida QuitterJackie Aspinall at [employer] [facility] to schedule his in-person interview. Attempted to provide her phone number; however, patient indicated he could not write down number at this time and indicated he was running out of minutes. Reminded patient of his upcoming Transitional Care Clinic appointment on 07/30/16 at 1000, and patient indicated he was aware of his appointment.  This Case Manager placed call to Melida QuitterJackie Cravens (#[phone]) with the SCAT Eligibility office and informed her that patient would [facility] to [facility] for [facility] application. She was appreciative of call. She indicated she has had difficulty reaching patient. She indicated she will make two additional attempts to reach patient to schedule in-person interview, and if she is unable to reach him she will send him a letter requesting he return call. No additional needs identified.

## 2016-07-25 ENCOUNTER — Telehealth: Payer: Self-pay

## 2016-07-25 NOTE — Telephone Encounter (Signed)
This Case Manager placed call to patient to remind him of his Transitional Care follow-up appointment on 07/30/16 at 1000 and to determine if he had transportation to his appointment. Call placed to #[phone]; however, unable to reach patient. HIPPA compliant voicemail left requesting return call.

## 2016-07-30 ENCOUNTER — Encounter: Payer: Self-pay | Admitting: Family Medicine

## 2016-07-30 ENCOUNTER — Ambulatory Visit: Payer: Medicaid Other | Attending: Family Medicine | Admitting: Family Medicine

## 2016-07-30 ENCOUNTER — Telehealth: Payer: Self-pay

## 2016-07-30 VITALS — BP 145/87 | HR 59 | Temp 97.5°F | Ht 76.5 in | Wt 185.8 lb

## 2016-07-30 DIAGNOSIS — I2510 Atherosclerotic heart disease of native coronary artery without angina pectoris: Secondary | ICD-10-CM

## 2016-07-30 DIAGNOSIS — Z9861 Coronary angioplasty status: Secondary | ICD-10-CM

## 2016-07-30 DIAGNOSIS — I10 Essential (primary) hypertension: Secondary | ICD-10-CM | POA: Diagnosis not present

## 2016-07-30 DIAGNOSIS — Z8673 Personal history of transient ischemic attack (TIA), and cerebral infarction without residual deficits: Secondary | ICD-10-CM | POA: Diagnosis not present

## 2016-07-30 DIAGNOSIS — Z7901 Long term (current) use of anticoagulants: Secondary | ICD-10-CM | POA: Diagnosis not present

## 2016-07-30 DIAGNOSIS — Z9114 Patient's other noncompliance with medication regimen: Secondary | ICD-10-CM | POA: Insufficient documentation

## 2016-07-30 DIAGNOSIS — J45909 Unspecified asthma, uncomplicated: Secondary | ICD-10-CM | POA: Diagnosis not present

## 2016-07-30 DIAGNOSIS — F329 Major depressive disorder, single episode, unspecified: Secondary | ICD-10-CM | POA: Diagnosis not present

## 2016-07-30 DIAGNOSIS — I2699 Other pulmonary embolism without acute cor pulmonale: Secondary | ICD-10-CM | POA: Diagnosis present

## 2016-07-30 DIAGNOSIS — H6692 Otitis media, unspecified, left ear: Secondary | ICD-10-CM | POA: Diagnosis not present

## 2016-07-30 DIAGNOSIS — H6592 Unspecified nonsuppurative otitis media, left ear: Secondary | ICD-10-CM

## 2016-07-30 DIAGNOSIS — I252 Old myocardial infarction: Secondary | ICD-10-CM | POA: Insufficient documentation

## 2016-07-30 DIAGNOSIS — G4733 Obstructive sleep apnea (adult) (pediatric): Secondary | ICD-10-CM | POA: Diagnosis not present

## 2016-07-30 DIAGNOSIS — F1910 Other psychoactive substance abuse, uncomplicated: Secondary | ICD-10-CM

## 2016-07-30 DIAGNOSIS — M5135 Other intervertebral disc degeneration, thoracolumbar region: Secondary | ICD-10-CM

## 2016-07-30 MED ORDER — AMOXICILLIN 500 MG PO CAPS: 500.0000 mg | ORAL_CAPSULE | Freq: Three times a day (TID) | ORAL | 0 refills | Status: DC

## 2016-07-30 MED ORDER — MOMETASONE FURO-FORMOTEROL FUM 100-5 MCG/ACT IN AERO: 2.0000 | INHALATION_SPRAY | Freq: Two times a day (BID) | RESPIRATORY_TRACT | 5 refills | Status: DC

## 2016-07-30 MED ORDER — TRAMADOL HCL 50 MG PO TABS: 50.0000 mg | ORAL_TABLET | Freq: Two times a day (BID) | ORAL | 1 refills | Status: DC | PRN

## 2016-07-30 MED FILL — traMADol HCL 50 MG TABS: 50 | 30 days supply | Qty: 60 | Fill #0

## 2016-07-30 MED FILL — AMOXICILLIN 500 MG CAPSULE: 500 | 10 days supply | Qty: 30 | Fill #0

## 2016-07-30 NOTE — Telephone Encounter (Signed)
Met with the patient when he was in the clinic today to see Dr Jarold Song. He discussed his need to secure permanent housing and provided the # for his contact at [employer] to check on the status of his application. He also informed this CM that he is interested in housing at [facility].   Call placed to Ferman Hamming, Eunice # [phone] and a HIPAA compliant voicemail message was left requesting a call back to # [phone] or [phone].   Call placed to [facility] # [phone] and the extension for the Property Manager just rang endlessly, no option to leave a message. A message was then left for the assistant requesting a call back to # [phone] or [phone].   Informed the patient of the messages that were left and he stated that the best way to reach him is to contact the [facility] and they will get a message to him. He noted that he will be able to stay at the [facility] until at least Sunday, 08/03/16 due to the cold weather.   Provided him with bus pass to get to the [employer]. Also provided him with a letter from Dr Jarold Song stating his need for expedited housing approval.

## 2016-07-30 NOTE — Progress Notes (Signed)
Transitional care clinic   Date of telephone encounter: 07/10/16  Hospitalization dates: 07/07/16 through 07/10/16  Subjective:    Patient ID: Timothy Peck, male    DOB: 03/13/1961, 55 y.o.   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  He had a recent hospitalization for chest pains, cardiac enzymes were negative, EKG was unchanged from previous Left heart cath revealed widely patent LAD stent with brisk flow and chest pain was thought to be likely non anginal. His Coumadin was restarted (as he had been off anticoagulation - previously on Xarelto on which he was non compliant) due to previous history of CVA and PE.  Today his INR is 1.0 Despite taking 7.5 mg of Coumadin daily.  He continues to have low back pain; lumbar spine x-ray revealed degenerative changes; he would like to be referred to spine specialist as he was previously scheduled for back surgery but the plans fell through. Pain sometimes radiates to his lower extremities and he ambulates with the aid of a cane. He is requesting something for pain today. Also complains of left ear pain and left-sided facial pain but denies sore throat, fever or myalgias.  Also requesting a letter to [employer] indicating his health conditions worsen when he is exposed to the cold so as to expedite his accomodation process.  Past Medical History:  Diagnosis Date  . Asthma   . Bilateral pulmonary embolism [facility](HCC) August 2014  . CAD S/P percutaneous coronary angioplasty 02/2012   NSTEMI - PCI to prox LAD  . Chronic anticoagulation December 2014   Switch from Xarelto to warfarin.  . Degenerative disc  disease, cervical    Dx years ago  . Depression   . 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 Hypertension   . NSTEMI (non-ST elevated myocardial infarction) (HCC)    07/18/13, stable cath with patent stent; preserved EF by echo; follow-up Echo for CVA 09/2013: EF 60%, mild LVH. No RDW and a. No cardiac source for embolism noted. Bubble study was not performed  . NSTEMI (non-ST elevated myocardial infarction), 07/18/13, stable cath with patent stent. 01/31/2012  . OSA (obstructive sleep apnea)    Diagnosis long ago, used to use CPAP, not currently on any treatment.  . Polysubstance abuse    Past history of cocaine and marijuana, both smoking. Also long standing tobacco abuse.   . Presence of drug coated stent in LAD coronary artery 02/2012   Promus Element DES 4.0 mm x 28 mm; converted from Brilinta to aspirin during stroke evaluation in February 2015    Past Surgical History:  Procedure Laterality Date  . ANTERIOR CERVICAL DECOMP/DISCECTOMY FUSION  11/28/2011   Procedure: ANTERIOR CERVICAL DECOMPRESSION/DISCECTOMY FUSION 1 LEVEL;  Surgeon: Temple PaciniHenry A Pool, MD;  Location: [facility] NEURO ORS;  Service: Neurosurgery;  Laterality: N/A;  Anterior Cervical Six-Seven Decompression and Fusion  . CARDIAC CATHETERIZATION N/A 07/08/2016   Procedure: Left Heart Cath and Coronary Angiography;  Surgeon: Marykay Lexavid W Harding, MD;  [facility];  Service: Cardiovascular;  Laterality: N/A;  . LEFT HEART CATHETERIZATION WITH CORONARY ANGIOGRAM N/A 01/31/2012   Procedure: LEFT HEART CATHETERIZATION WITH CORONARY ANGIOGRAM;  Surgeon: Runell GessJonathan J Berry, MD;  [facility];  Service: Cardiovascular: NSTEMI -  p-mLAD ~80% thrombotic lesion -> treated with [id number] Inhibitor & staged PCI  . LEFT HEART CATHETERIZATION WITH CORONARY ANGIOGRAM N/A 12/17/2012   Procedure: LEFT HEART CATHETERIZATION WITH CORONARY ANGIOGRAM;  Surgeon: Marykay Lex, MD;  Location: [facility] CATH LAB;  Service: Cardiovascular: Patent  LAD DES.  CP thought to be Non-anginal  . LEFT HEART CATHETERIZATION WITH CORONARY ANGIOGRAM N/A 07/18/2013   Procedure: LEFT HEART CATHETERIZATION WITH CORONARY ANGIOGRAM;  Surgeon: Marykay Lex, MD;  Location: [facility] CATH LAB;  Service: Cardiovascular: patent stent, stable CAD.  ? Sx related to Myopericarditis.  Marland Kitchen PERCUTANEOUS CORONARY STENT INTERVENTION (PCI-S) N/A 02/02/2012   Procedure: PERCUTANEOUS CORONARY STENT INTERVENTION (PCI-S);  Surgeon: Marykay Lex, MD;  Location: [facility] CATH LAB;  Service: Cardiovascular: Staged LAD PCI - Promus DES 4x28.    . TUMOR REMOVAL     left foot third toe    Allergies  Allergen Reactions  . Ibuprofen Other (See Comments)    Doctor told him he could not take     Review of Systems Constitutional: Negative for activity change and appetite change.  HENT: Negative for sinus pressure and sore throat.  Positive for earache and left sided facial pain Eyes: Negative for visual disturbance.  Respiratory: Negative for cough, chest tightness and shortness of breath.   Cardiovascular: Negative for chest pain and leg swelling.  Gastrointestinal: Negative for abdominal distention, abdominal pain, constipation and diarrhea.  Endocrine: Negative.   Genitourinary: Negative for dysuria.  Musculoskeletal: Positive for back pain. Negative for joint swelling and myalgias.  Skin: Negative for rash.  Allergic/Immunologic: Negative.   Neurological: Negative for weakness, light-headedness and numbness.  Psychiatric/Behavioral: Negative for dysphoric mood and suicidal ideas.     Objective: Vitals:   07/30/16 1033  BP: (!) 145/87  Pulse: (!) 59  Temp: 97.5 F (36.4 C)  TempSrc: Oral  SpO2: 98%  Weight: 185 lb 12.8 oz (84.3 kg)  Height: 6' 4.5" (1.943 m)      Physical Exam Constitutional: He is oriented to person, place, and time. He appears well-developed and well-nourished.  HEENT  Erythema of left EAC; tenderness on palpation of left frontal, maxillary and  ethmoidal sinuses Cardiovascular: Normal rate, normal heart sounds and intact distal pulses.   No murmur heard. Pulmonary/Chest: Effort normal and breath sounds normal. He has no wheezes. He has no rales. He exhibits no tenderness.  Abdominal: Soft. Bowel sounds are normal. He exhibits no distension and no mass. There is no tenderness.  Musculoskeletal: He exhibits tenderness (tenderness on palpation of the thoracolumbar spine).  Neurological: He is alert and oriented to person, place, and time.  Skin: Skin is warm and dry.  Psychiatric: He has a normal mood and affect.       CLINICAL DATA:  Degenerative disc disease.  EXAM: THORACOLUMBAR SPINE 1V  COMPARISON:  Chest x-ray 07/07/2016  FINDINGS: Mild spurring at the T11-12 level. Disc spaces are maintained in the visualized lower thoracic and lumbar spine. Normal alignment. No fracture.  IMPRESSION: Degenerative spurring in the lower thoracic spine. No acute findings.   Electronically Signed   By: Charlett Nose M.D.   On: 07/16/2016 10:53CLINICAL DATA:  Degenerative disc disease.  EXAM: THORACOLUMBAR SPINE 1V  COMPARISON:  Chest x-ray 07/07/2016  FINDINGS: Mild spurring at the T11-12 level. Disc spaces are maintained in the visualized lower thoracic and lumbar spine. Normal alignment. No fracture.  IMPRESSION: Degenerative spurring in the lower thoracic spine. No acute findings.   Electronically  Signed   By: Charlett NoseKevin  Dover M.D.   On: 07/16/2016 10:53  Assessment & Plan:  1. 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  Dispense: 45 tablet; Refill: 1  2. Noncompliance with medications Stressed compliance and implications of noncompliance  including complications or medical condition and he is willing to be more compliant  3. Essential hypertension Controlled Continue antihypertensives  4. CAD S/P percutaneous coronary angioplasty 02/21/12 non-STEMI -Promus DES 4.0 mm 20 mm to prox LAD No angina Risk factor modification - isosorbide dinitrate (ISORDIL) 20 MG tablet; Take 1 tablet (20 mg total) by mouth 2 (two) times daily.  Dispense: 60 tablet; Refill: 3 - atorvastatin (LIPITOR) 80 MG tablet; Take 1 tablet (80 mg total) by mouth at bedtime.  Dispense: 30 tablet; Refill: 3  5. H/o ischemic right ica stroke No residual deficits  6. DDD (degenerative disc disease), thoracolumbar He has had a history of cervical spine surgery. X-ray revealed degenerative changes He will need lumbar spine MRI and referral to a spine specialist Once result is obtained  He has been counseled against substance abuse in the setting of receiving Tramadol Will perform drug screen at his next visit and this will determine if he will receive a refill of tramadol.  7. Left otitis media Could also have referred pain to the face as well as possible early sinusitis Placed on amoxicillin Will need to keep a close watch on his INR.

## 2016-07-30 NOTE — Progress Notes (Signed)
Refill on dulera

## 2016-07-30 NOTE — Telephone Encounter (Signed)
Call received from the patient stating that [facility] and needs transportation to his appointment with Dr Venetia NightAmao @ 1000. He stated that he knows his will be late for the appointment. This CM spoke with Dr Venetia NightAmao who stated that he can still come in for his appointment despite being late.  Idelle Leechhadelle Brown, [facility] scheduler notified of the need for cab transportation. The [facility] was supplied. The patient stated that he hopes to be abel to secure permanent housing this week.  He stated that he would discuss further with this CM when he is at the clinic today for his appointment. 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

## 2016-08-01 ENCOUNTER — Telehealth: Payer: Self-pay | Admitting: Licensed Clinical Social Worker

## 2016-08-01 NOTE — Telephone Encounter (Signed)
LCSWA attempted to contact pt through [facility] as requested by pt. A message was left for a return call.

## 2016-08-01 NOTE — Telephone Encounter (Signed)
LCSWA placed call to Jerel ShepherdJackie Garvey with the [employer] [phone].   Ms. Dannette BarbaraGarvey reported that pt completed an application for [facility] April 2017; however, has not been selected for placement at present time. Pt's next step is to wait for a letter to be sent to him notifying him of selection into the program, in addition, to providing requested documentation.   Ms. Dannette BarbaraGarvey noted that a letter from pt's PCP will not expedite housing approval. She was unable to provide a time-frame for when pt will be provided housing through [employer] noting they do not provide emergency housing. Ms. Dannette BarbaraGarvey suggested that pt update mailing address to ensure receipt of letter when sent.

## 2016-08-05 ENCOUNTER — Telehealth: Payer: Self-pay | Admitting: Family Medicine

## 2016-08-05 NOTE — Telephone Encounter (Signed)
Good Morning  Medicaid denied the procedures MRI Lumbar Spine wo  Contrast &  MRI Thoracic spine wo contrast please, contact radiology  [phone] to cancel the appointment ,conctact the patient too appointment 08-08-16 @ 3pm  And if the provider will like to appeal can contact [phone] Service Order  [id number] Thank You  And Happy New Year .  MRI Thoracic Spine, (spinal canal and contents); without contrast material Denied Based on [employer] Spine Imaging Guidelines, we are unable to approve the requested procedure. MRI might be supported in the evaluation of suspected or known spinal disease with one of the following: 1) failure to improve after a recent (within 3 months) 6 week trial of physician-guided clinical care (treatment or observation) with clinical re-evaluation, or 2) any signs or symptoms such as significant motor weakness, recent malignancy or infection, cauda equina syndrome, for which conservative treatment is not needed. The clinical information received fails to support meeting these requirements and, therefore, the requested procedure is not indicated at this time.  MRI Lumbar Spine, (spinal canal and contents); without contrast material Denied Based on [employer] Spine Imaging Guidelines, we are unable to approve the requested procedure. MRI might be supported in the evaluation of suspected or known spinal disease with one of the following: 1) failure to improve after a recent (within 3 months) 6 week trial of physician-guided clinical care (treatment or observation) with clinical re-evaluation, or 2) any signs or symptoms such as significant motor weakness, recent malignancy or infection, cauda equina syndrome, for which conservative treatment is not needed. The clinical information received fails to support meeting these requirements and, therefore, the requested procedure is not indicated at this time.

## 2016-08-06 NOTE — Telephone Encounter (Signed)
Please inform him that Medicaid denied his lumbar spine MRI; inquire if he is requesting a referral to pain management or to a spine surgeon. Could you please cancel his scheduled MRI appointment? Thanks

## 2016-08-07 ENCOUNTER — Telehealth: Payer: Self-pay

## 2016-08-07 NOTE — Telephone Encounter (Signed)
Writer called patient to verify the cancellation of scheduled MRI of spine and requested patient to call back if he would like MD to place a referral to pain management and or a spine surgeon.

## 2016-08-07 NOTE — Telephone Encounter (Signed)
This Case Manager placed call to patient to check on status and to discuss scheduling follow-up appointment with Dr. Venetia NightAmao. Unable to reach patient; HIPPA compliant voicemail left requesting return call.

## 2016-08-07 NOTE — Telephone Encounter (Signed)
Lvm to patient about the cancellation of his procedure

## 2016-08-08 ENCOUNTER — Ambulatory Visit (HOSPITAL_COMMUNITY): Payer: Medicaid Other

## 2016-08-08 ENCOUNTER — Ambulatory Visit (HOSPITAL_COMMUNITY): Admission: RE | Admit: 2016-08-08 | Payer: Medicaid Other | Source: Ambulatory Visit

## 2016-08-08 MED FILL — DULERA 100 MCG/5 MCG INH: 100-5 | 30 days supply | Qty: 13 | Fill #0

## 2016-08-12 ENCOUNTER — Telehealth: Payer: Self-pay

## 2016-08-12 NOTE — Telephone Encounter (Signed)
Attempted to contact the patient to check on his status and to confirm [facility], 08/[facility]/16 @ 1430. Call placed to # [phone] (H) and a HIPAA compliant voicemail message was left requesting a call back to # [phone] or [phone].

## 2016-08-13 ENCOUNTER — Ambulatory Visit: Payer: Medicaid Other | Admitting: Family Medicine

## 2016-08-15 ENCOUNTER — Telehealth: Payer: Self-pay

## 2016-08-15 NOTE — Telephone Encounter (Signed)
This Case Manager placed call to patient to check status and to discuss rescheduling Transitional Care appointment as patient missed his appointment on 08/13/16. Call placed to #[phone]; however, unable to reach patient. HIPPA compliant voicemail left requesting return call.

## 2016-10-17 ENCOUNTER — Telehealth: Payer: Self-pay | Admitting: Family Medicine

## 2016-10-17 NOTE — Telephone Encounter (Signed)
Patient calling requesting a re-schdedule for spinal surgery. Pt states he missed the previous scheduled surgery due to becoming homeless.  Pt states this is urgent. He has begun to lose mobility in his R side  [facility] and can be reached at [phone] (hotel) or else at [phone]

## 2016-10-17 NOTE — Telephone Encounter (Signed)
I do not perform spinal surgery and so I am unclear about rescheduling a spinal surgery. He will need to contact his surgeon's office if he needs to reschedule a surgery.

## 2016-10-20 ENCOUNTER — Telehealth: Payer: Self-pay

## 2016-10-20 NOTE — Telephone Encounter (Signed)
Message received from HilltopSierra Harrelson, [facility] noting that the patient would like a call back from this CM on 10/20/16. Call placed to # [phone] and a HIPAA compliant voicemail message was left requesting a call back to # [phone] or [phone]. Call also placed to # [phone] but the phone rang endlessly, no option to leave a message.

## 2016-10-21 ENCOUNTER — Telehealth: Payer: Self-pay

## 2016-10-21 NOTE — Telephone Encounter (Signed)
Call received from the patient. 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 Explained to him that Dr Venetia NightAmao was aware of his call and he needs to call his surgeon's office.  He said that he does not have a Careers advisersurgeon and did not have the MRI.  In the meantime, his medicaid has expired. Instructed him to contact medicaid or go to the office to inquire if he is able to have his medicaid re-instated. Also explained that he needs to apply for an orange card in order for a referral to be made to a surgeon. Explained that he will need to make an appointment [facility]. He stated that he will first follow up with medicaid.   Offered him an appointment to see Dr Venetia NightAmao today at 1400 but he said that he could not make it. He will need to call the clinic at a later time to check appointment availability.   udpate provided to Dr Venetia NightAmao.

## 2016-10-21 NOTE — Telephone Encounter (Signed)
.    This was opened in error.

## 2016-10-30 NOTE — Telephone Encounter (Signed)
Laural BenesJane B., RN case manager spoke with patient and took care of his back issue.

## 2016-11-10 ENCOUNTER — Telehealth: Payer: Self-pay

## 2016-11-10 NOTE — Telephone Encounter (Signed)
Called to find out if pt would like to have colonoscopy scheduled 

## 2018-02-10 ENCOUNTER — Telehealth: Payer: Self-pay

## 2018-02-10 ENCOUNTER — Encounter: Payer: Self-pay | Admitting: Family Medicine

## 2018-02-10 ENCOUNTER — Ambulatory Visit: Payer: Medicaid Other | Attending: Family Medicine | Admitting: Family Medicine

## 2018-02-10 VITALS — BP 135/81 | HR 55 | Temp 98.1°F | Wt 156.6 lb

## 2018-02-10 DIAGNOSIS — F329 Major depressive disorder, single episode, unspecified: Secondary | ICD-10-CM | POA: Diagnosis not present

## 2018-02-10 DIAGNOSIS — Z955 Presence of coronary angioplasty implant and graft: Secondary | ICD-10-CM | POA: Diagnosis not present

## 2018-02-10 DIAGNOSIS — J45909 Unspecified asthma, uncomplicated: Secondary | ICD-10-CM | POA: Diagnosis not present

## 2018-02-10 DIAGNOSIS — Z9119 Patient's noncompliance with other medical treatment and regimen: Secondary | ICD-10-CM | POA: Insufficient documentation

## 2018-02-10 DIAGNOSIS — G4733 Obstructive sleep apnea (adult) (pediatric): Secondary | ICD-10-CM | POA: Insufficient documentation

## 2018-02-10 DIAGNOSIS — Z7901 Long term (current) use of anticoagulants: Secondary | ICD-10-CM | POA: Diagnosis not present

## 2018-02-10 DIAGNOSIS — Z79899 Other long term (current) drug therapy: Secondary | ICD-10-CM | POA: Diagnosis not present

## 2018-02-10 DIAGNOSIS — Z590 Homelessness: Secondary | ICD-10-CM | POA: Insufficient documentation

## 2018-02-10 DIAGNOSIS — F1210 Cannabis abuse, uncomplicated: Secondary | ICD-10-CM | POA: Insufficient documentation

## 2018-02-10 DIAGNOSIS — M5135 Other intervertebral disc degeneration, thoracolumbar region: Secondary | ICD-10-CM

## 2018-02-10 DIAGNOSIS — Z720 Tobacco use: Secondary | ICD-10-CM | POA: Diagnosis not present

## 2018-02-10 DIAGNOSIS — I2699 Other pulmonary embolism without acute cor pulmonale: Secondary | ICD-10-CM

## 2018-02-10 DIAGNOSIS — Z86711 Personal history of pulmonary embolism: Secondary | ICD-10-CM | POA: Diagnosis not present

## 2018-02-10 DIAGNOSIS — F1420 Cocaine dependence, uncomplicated: Secondary | ICD-10-CM | POA: Diagnosis not present

## 2018-02-10 DIAGNOSIS — I10 Essential (primary) hypertension: Secondary | ICD-10-CM | POA: Insufficient documentation

## 2018-02-10 DIAGNOSIS — I252 Old myocardial infarction: Secondary | ICD-10-CM | POA: Insufficient documentation

## 2018-02-10 DIAGNOSIS — M5136 Other intervertebral disc degeneration, lumbar region: Secondary | ICD-10-CM | POA: Insufficient documentation

## 2018-02-10 DIAGNOSIS — I2510 Atherosclerotic heart disease of native coronary artery without angina pectoris: Secondary | ICD-10-CM | POA: Insufficient documentation

## 2018-02-10 DIAGNOSIS — Z9861 Coronary angioplasty status: Secondary | ICD-10-CM | POA: Insufficient documentation

## 2018-02-10 DIAGNOSIS — Z8673 Personal history of transient ischemic attack (TIA), and cerebral infarction without residual deficits: Secondary | ICD-10-CM | POA: Insufficient documentation

## 2018-02-10 DIAGNOSIS — I25110 Atherosclerotic heart disease of native coronary artery with unstable angina pectoris: Secondary | ICD-10-CM | POA: Diagnosis not present

## 2018-02-10 DIAGNOSIS — Z9114 Patient's other noncompliance with medication regimen: Secondary | ICD-10-CM | POA: Diagnosis not present

## 2018-02-10 DIAGNOSIS — M5134 Other intervertebral disc degeneration, thoracic region: Secondary | ICD-10-CM | POA: Diagnosis not present

## 2018-02-10 MED ORDER — METHOCARBAMOL 500 MG PO TABS: 500.0000 mg | ORAL_TABLET | Freq: Three times a day (TID) | ORAL | 3 refills | Status: DC | PRN

## 2018-02-10 MED ORDER — ISOSORBIDE DINITRATE 20 MG PO TABS: 20.0000 mg | ORAL_TABLET | Freq: Two times a day (BID) | ORAL | 3 refills | Status: DC

## 2018-02-10 MED ORDER — RIVAROXABAN 20 MG PO TABS: 20.0000 mg | ORAL_TABLET | Freq: Every day | ORAL | 3 refills | Status: DC

## 2018-02-10 MED ORDER — ATORVASTATIN CALCIUM 80 MG PO TABS: 80.0000 mg | ORAL_TABLET | Freq: Every day | ORAL | 3 refills | Status: DC

## 2018-02-10 MED ORDER — GABAPENTIN 300 MG PO CAPS: 300.0000 mg | ORAL_CAPSULE | Freq: Two times a day (BID) | ORAL | 3 refills | Status: DC

## 2018-02-10 MED ORDER — MOMETASONE FURO-FORMOTEROL FUM 100-5 MCG/ACT IN AERO: 2.0000 | INHALATION_SPRAY | Freq: Two times a day (BID) | RESPIRATORY_TRACT | 3 refills | Status: DC

## 2018-02-10 MED FILL — GABAPENTIN 300 MG CAPSULE: 300 | 30 days supply | Qty: 60 | Fill #0

## 2018-02-10 MED FILL — METHOCARBAMOL 500 MG TABS: 500 | 30 days supply | Qty: 90 | Fill #0

## 2018-02-10 MED FILL — ISOSORBIDE DN 20 MG TABLET: 20 | 30 days supply | Qty: 60 | Fill #0

## 2018-02-10 MED FILL — ATORVASTATIN 80 MG TABLET: 80 | 30 days supply | Qty: 30 | Fill #0

## 2018-02-10 MED FILL — DULERA 100 MCG/5 MCG INH: 100-5 | 30 days supply | Qty: 13 | Fill #0

## 2018-02-10 MED FILL — XARELTO 20 MG TABLET: 20 | 30 days supply | Qty: 30 | Fill #0
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
## 2018-02-10 NOTE — Progress Notes (Signed)
Subjective:  Patient ID: Timothy Peck, male    DOB: 1961-05-22  Age: 57 y.o. 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 Last seen in the clinic 2 years ago and attributes this to running out of Medicaid and being homeless for that duration has been out of his medications for the last 2 years.  He has now found a home and will be moving into his apartment sometime this month. He would like to get back on all his medications. Previously took Coumadin for recurrent PE but has also been out of this.  He has had some shortness of breath and intermittent chest pains but no pedal edema.  Cardiac cath from 07/2016:  Prox LAD BMS (Vision 4.0 x 28 from 2013), 5 %stenosed.  RPDA lesion, 100 %stenosed - actually a very small distal branch in the inferoapex. - Very minimal distal flow and a <1 mm vessel.  LV end diastolic pressure is normal.  The left ventricular ejection fraction is greater than 65% by visual estimate.   Angiographically widely patent LAD stent with brisk flow down the LAD. Potential etiology for what looks like a probable fixed inferoapical defect is either the prior LAD non-STEMI from 2013 versus very tiny distal portion of the PDA that appears to be occluded. This vessel is too small for PCI. Would also likely be too small to consider a source for angina. Likely nonanginal chest pain. The patient has multiple different etiologies for chest pain including muscle skeletal pain, prior PE related pain as well as history of pericarditis. He could have intermittent coronary spasm, however there is no  angiographic evidence to suggest a potential lesion for spasm.    He also has worsening lower back pain and the plan at his last visit 2 years ago was to repeat an MRI and then refer him to a spine surgeon.  Pain in his lower back radiates down his lower extremity and he endorses paresthesias in all his extremities.  Currently ambulates with aid of a cane. With regards to his substance abuse he endorses using marijuana and cocaine but informs me he is currently in a rehab program.  Past Medical History:  Diagnosis Date  . Asthma   . Bilateral pulmonary embolism [facility]) August 2014  . CAD S/P percutaneous coronary angioplasty 02/2012   NSTEMI - PCI to prox LAD  . Chronic anticoagulation December 2014   Switch from Xarelto to warfarin.  . Degenerative disc disease, cervical    Dx years ago  . Depression   . 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 Hypertension   . NSTEMI (non-ST elevated myocardial infarction) ([place])    07/18/13, stable cath with patent stent; preserved EF by echo; follow-up Echo for CVA 09/2013: EF 60%, mild LVH. No RDW and a. No cardiac source for embolism noted. Bubble study was not performed  . NSTEMI (non-ST elevated myocardial infarction), 07/18/13, stable cath with patent stent. 01/31/2012  . OSA (obstructive sleep apnea)    Diagnosis long ago, used to use CPAP, not currently on any treatment.  . Polysubstance abuse ([place])    Past history of cocaine and marijuana, both smoking. Also long  standing tobacco abuse.   . Presence of drug coated stent in LAD coronary artery 02/2012   Promus Element DES 4.0 mm x 28 mm; converted from Brilinta to aspirin during stroke evaluation in February 2015    Past Surgical History:  Procedure Laterality Date  . ANTERIOR CERVICAL DECOMP/DISCECTOMY FUSION  11/28/2011   Procedure: ANTERIOR CERVICAL DECOMPRESSION/DISCECTOMY FUSION 1 LEVEL;  Surgeon: Charlie Pitter, MD;  Location: [facility] NEURO ORS;  Service: Neurosurgery;  Laterality: N/A;   Anterior Cervical Six-Seven Decompression and Fusion  . CARDIAC CATHETERIZATION N/A 07/08/2016   Procedure: Left Heart Cath and Coronary Angiography;  Surgeon: Leonie Man, MD;  Location: [facility] CV LAB;  Service: Cardiovascular;  Laterality: N/A;  . LEFT HEART CATHETERIZATION WITH CORONARY ANGIOGRAM N/A 01/31/2012   Procedure: LEFT HEART CATHETERIZATION WITH CORONARY ANGIOGRAM;  Surgeon: Lorretta Harp, MD;  Location: [facility] CATH LAB;  Service: Cardiovascular: NSTEMI -  p-mLAD ~80% thrombotic lesion -> treated with [id number] Inhibitor & staged PCI  . LEFT HEART CATHETERIZATION WITH CORONARY ANGIOGRAM N/A 12/17/2012   Procedure: LEFT HEART CATHETERIZATION WITH CORONARY ANGIOGRAM;  Surgeon: Leonie Man, MD;  Location: [facility] CATH LAB;  Service: Cardiovascular: Patent LAD DES.  CP thought to be Non-anginal  . LEFT HEART CATHETERIZATION WITH CORONARY ANGIOGRAM N/A 07/18/2013   Procedure: LEFT HEART CATHETERIZATION WITH CORONARY ANGIOGRAM;  Surgeon: Leonie Man, MD;  Location: [facility] CATH LAB;  Service: Cardiovascular: patent stent, stable CAD.  ? Sx related to Myopericarditis.  Marland Kitchen PERCUTANEOUS CORONARY STENT INTERVENTION (PCI-S) N/A 02/02/2012   Procedure: PERCUTANEOUS CORONARY STENT INTERVENTION (PCI-S);  Surgeon: Leonie Man, MD;  Location: [facility] CATH LAB;  Service: Cardiovascular: Staged LAD PCI - Promus DES 4x28.    . TUMOR REMOVAL     left foot third toe     Outpatient Medications Prior to Visit  Medication Sig Dispense Refill  . traMADol (ULTRAM) 50 MG tablet Take 1 tablet (50 mg total) by mouth every 12 (twelve) hours as needed. (Patient not taking: Reported on 02/10/2018) 60 tablet 1  . amoxicillin (AMOXIL) 500 MG capsule Take 1 capsule (500 mg total) by mouth 3 (three) times daily. (Patient not taking: Reported on 02/10/2018) 30 capsule 0  . atorvastatin (LIPITOR) 80 MG tablet Take 1 tablet (80 mg total) by mouth at bedtime. (Patient not taking: Reported on 02/10/2018) 30 tablet 3  .  isosorbide dinitrate (ISORDIL) 20 MG tablet Take 1 tablet (20 mg total) by mouth 2 (two) times daily. (Patient not taking: Reported on 02/10/2018) 60 tablet 3  . mometasone-formoterol (DULERA) 100-5 MCG/ACT AERO Inhale 2 puffs into the lungs 2 (two) times daily. (Patient not taking: Reported on 02/10/2018) 1 Inhaler 5  . warfarin (COUMADIN) 5 MG tablet Take 1.5 tablets (7.5 mg total) by mouth daily. (Patient not taking: Reported on 02/10/2018) 45 tablet 1   No facility-administered medications prior to visit.     ROS Review of Systems  Constitutional: Negative for activity change and appetite change.  HENT: Negative for sinus pressure and sore throat.   Eyes: Negative for visual disturbance.  Respiratory: Negative for cough, chest tightness and shortness of breath.   Cardiovascular: Negative for chest pain and leg swelling.  Gastrointestinal: Negative for abdominal distention, abdominal pain, constipation and diarrhea.  Endocrine: Negative.   Genitourinary: Negative for dysuria.  Musculoskeletal: Positive for back pain and gait problem. Negative for joint swelling and myalgias.  Skin: Negative for rash.  Allergic/Immunologic: Negative.   Neurological: Positive for numbness. Negative for weakness  and light-headedness.  Psychiatric/Behavioral: Negative for dysphoric mood and suicidal ideas.    Objective:  BP 135/81   Pulse (!) 55   Temp 98.1 F (36.7 C) (Oral)   Wt 156 lb 9.6 oz (71 kg)   SpO2 97%   BMI 18.81 kg/m   BP/Weight 02/10/2018 07/30/2016 43/15/4008  Systolic BP 676 195 093  Diastolic BP 81 87 75  Wt. (Lbs) 156.6 185.8 -  BMI 18.81 22.32 -  Some encounter information is confidential and restricted. Go to Review Flowsheets activity to see all data.      Physical Exam  Constitutional: He is oriented to person, place, and time. He appears well-developed and well-nourished.  Neck:  Tenderness and active range of motion of neck  Cardiovascular: Normal rate, normal heart  sounds and intact distal pulses.  No murmur heard. Pulmonary/Chest: Effort normal and breath sounds normal. He has no wheezes. He has no rales. He exhibits no tenderness.  Abdominal: Soft. Bowel sounds are normal. He exhibits no distension and no mass. There is no tenderness.  Musculoskeletal:  Tenderness on palpation  across lumbar spine; positive straight leg raise bilaterally  Neurological: He is alert and oriented to person, place, and time.  Skin: Skin is warm and dry.  Psychiatric: He has a normal mood and affect.     Assessment & Plan:   1. CAD S/P percutaneous coronary angioplasty 02/21/12 non-STEMI -Promus DES 4.0 mm 20 mm to prox LAD Patient with unstable angina Risk factor modification-discussed the need to quit polysubstance abuse noncompliance with medications have been emphasized Report to the ED whenever he develops chest pain Advised that the clinic and pharmacy will work with him in the event that he has difficulty obtaining his medications (out of Medicaid - atorvastatin (LIPITOR) 80 MG tablet; Take 1 tablet (80 mg total) by mouth at bedtime.  Dispense: 30 tablet; Refill: 3 - isosorbide dinitrate (ISORDIL) 20 MG tablet; Take 1 tablet (20 mg total) by mouth 2 (two) times daily.  Dispense: 60 tablet; Refill: 3 - CMP14+EGFR  2. Cocaine dependence, uncomplicated ([place]) Discussed cessation Currently enrolled in a rehab program  3. Bilateral pulmonary embolism (HCC) Switch from Coumadin to Xarelto - rivaroxaban (XARELTO) 20 MG TABS tablet; Take 1 tablet (20 mg total) by mouth daily with supper.  Dispense: 30 tablet; Refill: 3  4. Noncompliance with medications Emphasized the need to comply with medications Discussed the implications of noncompliance  5. H/o ischemic right ica stroke Risk factor modification  6. DDD (degenerative disc disease), thoracolumbar Uncontrolled initiate Robaxin and gabapentin High risk patient due to polysubstance abuse Hold off on opioid  analgesics We will discuss repeat MRI and possible spine surgery referral at next visit - methocarbamol (ROBAXIN) 500 MG tablet; Take 1 tablet (500 mg total) by mouth every 8 (eight) hours as needed for muscle spasms.  Dispense: 90 tablet; Refill: 3 - gabapentin (NEURONTIN) 300 MG capsule; Take 1 capsule (300 mg total) by mouth 2 (two) times daily.  Dispense: 60 capsule; Refill: 3   Meds ordered this encounter  Medications  . atorvastatin (LIPITOR) 80 MG tablet    Sig: Take 1 tablet (80 mg total) by mouth at bedtime.    Dispense:  30 tablet    Refill:  3  . isosorbide dinitrate (ISORDIL) 20 MG tablet    Sig: Take 1 tablet (20 mg total) by mouth 2 (two) times daily.    Dispense:  60 tablet    Refill:  3  . rivaroxaban (XARELTO)  20 MG TABS tablet    Sig: Take 1 tablet (20 mg total) by mouth daily with supper.    Dispense:  30 tablet    Refill:  3  . mometasone-formoterol (DULERA) 100-5 MCG/ACT AERO    Sig: Inhale 2 puffs into the lungs 2 (two) times daily.    Dispense:  1 Inhaler    Refill:  3  . methocarbamol (ROBAXIN) 500 MG tablet    Sig: Take 1 tablet (500 mg total) by mouth every 8 (eight) hours as needed for muscle spasms.    Dispense:  90 tablet    Refill:  3  . gabapentin (NEURONTIN) 300 MG capsule    Sig: Take 1 capsule (300 mg total) by mouth 2 (two) times daily.    Dispense:  60 capsule    Refill:  3    Follow-up: Return in about 6 weeks (around 03/24/2018) for Follow-up on back pain.   Charlott Rakes MD

## 2018-02-10 NOTE — Patient Instructions (Signed)

## 2018-02-10 NOTE — Progress Notes (Signed)
Patient has not had medication in 2 years.

## 2018-02-11 LAB — CMP14+EGFR
ALBUMIN: 4.4 g/dL (ref 3.5–5.5)
ALT: 20 IU/L (ref 0–44)
AST: 23 IU/L (ref 0–40)
Albumin/Globulin Ratio: 1.8 (ref 1.2–2.2)
Alkaline Phosphatase: 66 IU/L (ref 39–117)
BILIRUBIN TOTAL: 0.5 mg/dL (ref 0.0–1.2)
BUN / CREAT RATIO: 13 (ref 9–20)
BUN: 20 mg/dL (ref 6–24)
CALCIUM: 9.3 mg/dL (ref 8.7–10.2)
CHLORIDE: 101 mmol/L (ref 96–106)
CO2: 26 mmol/L (ref 20–29)
CREATININE: 1.49 mg/dL — AB (ref 0.76–1.27)
GFR calc non Af Amer: 52 mL/min/{1.73_m2} — ABNORMAL LOW (ref >59)
GFR, EST AFRICAN AMERICAN: 60 mL/min/{1.73_m2} (ref >59)
GLUCOSE: 76 mg/dL (ref 65–99)
Globulin, Total: 2.4 g/dL (ref 1.5–4.5)
Potassium: 4.5 mmol/L (ref 3.5–5.2)
Sodium: 141 mmol/L (ref 134–144)
TOTAL PROTEIN: 6.8 g/dL (ref 6.0–8.5)

## 2018-02-12 ENCOUNTER — Telehealth: Payer: Self-pay | Admitting: *Deleted

## 2018-02-12 NOTE — Telephone Encounter (Signed)
Notes recorded by Guy FrancoBenjamin, Eloyse Causey, RN on 02/12/2018 at 1:51 PM EDT Left message on voicemail to return call ------ Please inform patient upon call back the  Notes recorded by Hoy RegisterNewlin, Enobong, MD on 02/11/2018 at 12:52 PM EDT Labs are stable

## 2018-02-17 ENCOUNTER — Telehealth: Payer: Self-pay

## 2018-02-17 NOTE — Telephone Encounter (Signed)
Completed [facility]CS referral faxed to [facility] °

## 2018-02-22 ENCOUNTER — Telehealth: Payer: Self-pay

## 2018-02-22 NOTE — Telephone Encounter (Signed)
Call placed to [facility], spoke to [place] who confirmed receipt of the referral and stated that they are in the process of trying to contact the patient to schedule an assessment.

## 2018-04-07 ENCOUNTER — Ambulatory Visit: Payer: Medicaid Other | Admitting: Family Medicine

## 2018-06-22 IMAGING — CT CT ANGIO CHEST
1 of 8 series · 17 of 36 positions shown · IV contrast (Iohexol (Omnipaque 350))
Comparison: 09/20/2014

CLINICAL DATA: Shortness of breath for 3 weeks.

EXAM:
CT ANGIOGRAPHY CHEST WITH CONTRAST
TECHNIQUE: Multidetector CT imaging of the chest was performed using the
standard protocol during bolus administration of intravenous
contrast. Multiplanar CT image reconstructions and MIPs were
obtained to evaluate the vascular anatomy.
CONTRAST:  100 cc of Isovue 370

[Series 406: thins pacs · axial · 0.68mm/px · z∈[+59,+331]mm · 17 of 306 slices shown]
[im 17/306  lung]
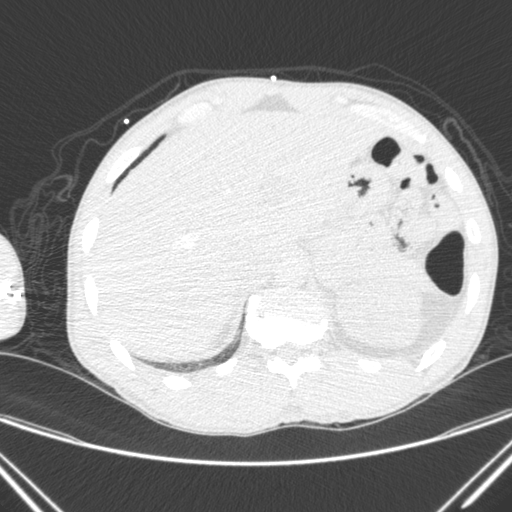
[im 34/306  mediastinal]
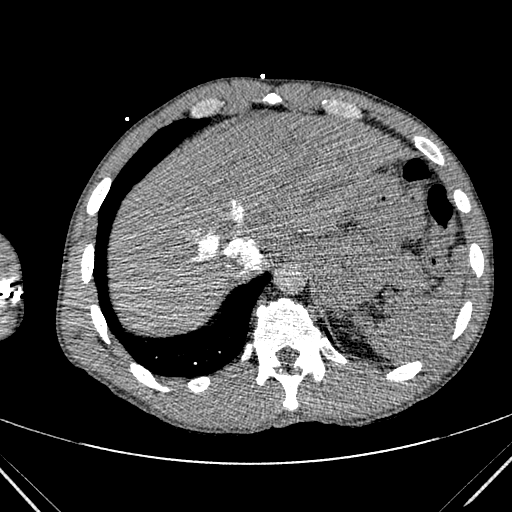
[im 51/306  lung]
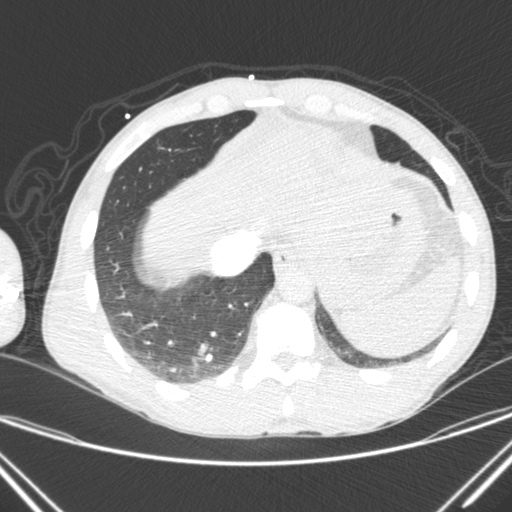
[im 68/306  mediastinal]
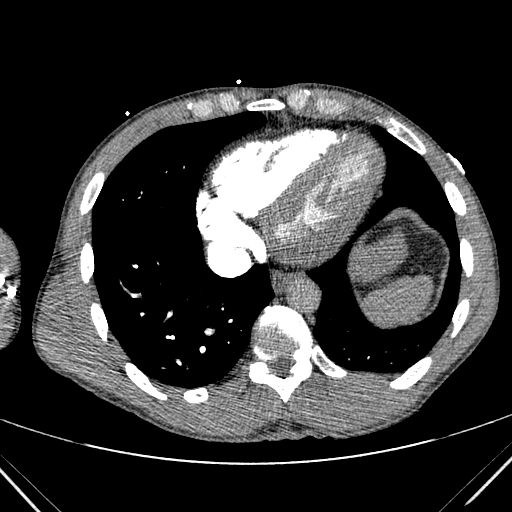
[im 85/306  lung]
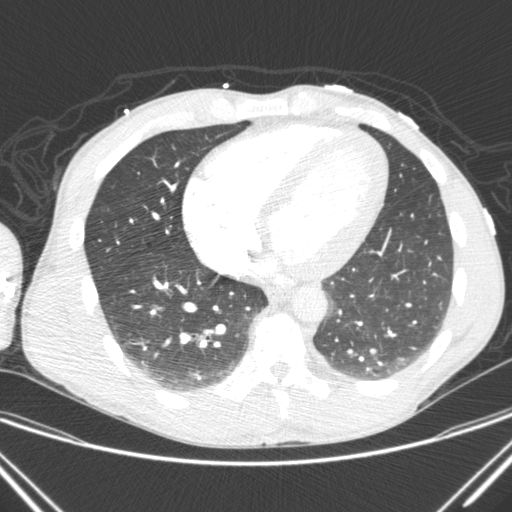
[im 102/306  mediastinal]
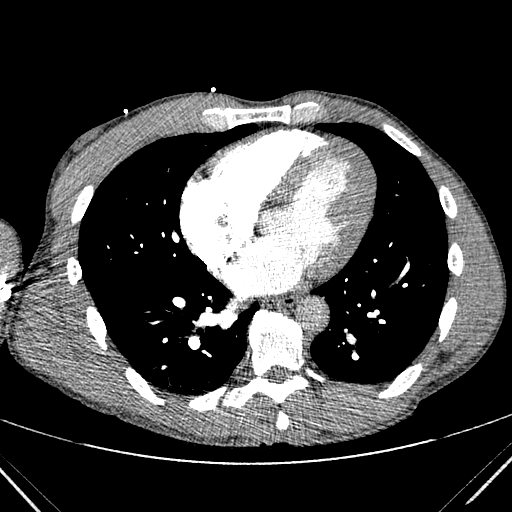
[im 119/306  lung]
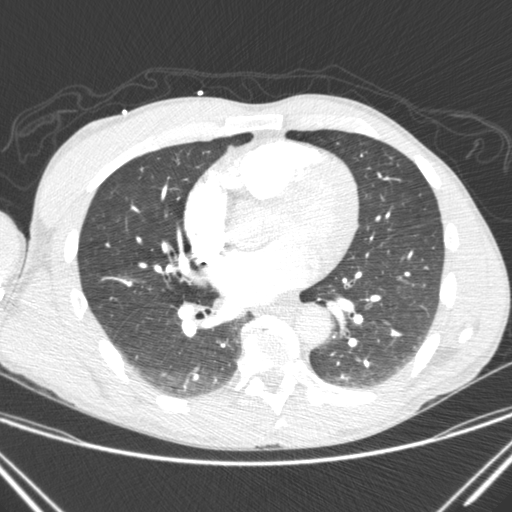
[im 136/306  mediastinal]
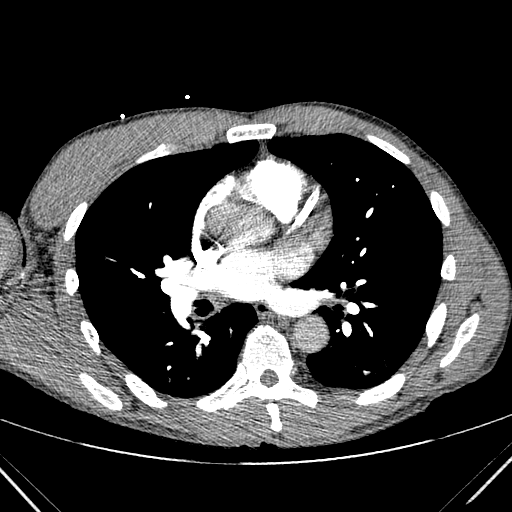
[im 153/306  lung]
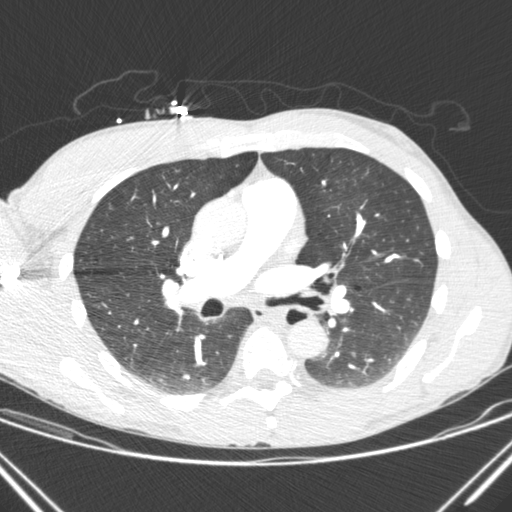
[im 170/306  mediastinal]
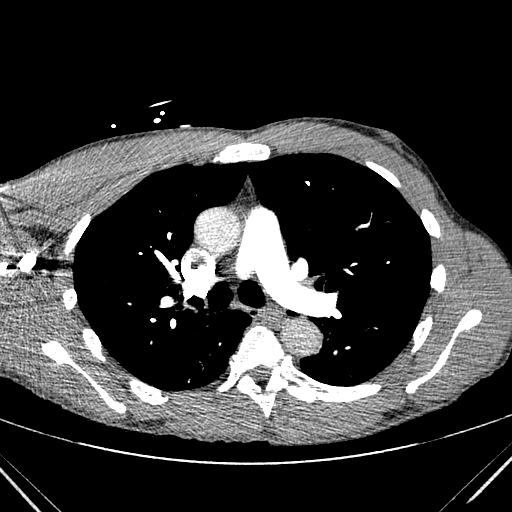
[im 187/306  lung]
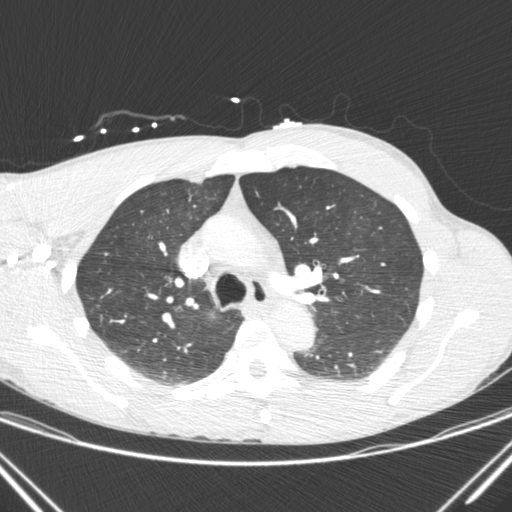
[im 204/306  mediastinal]
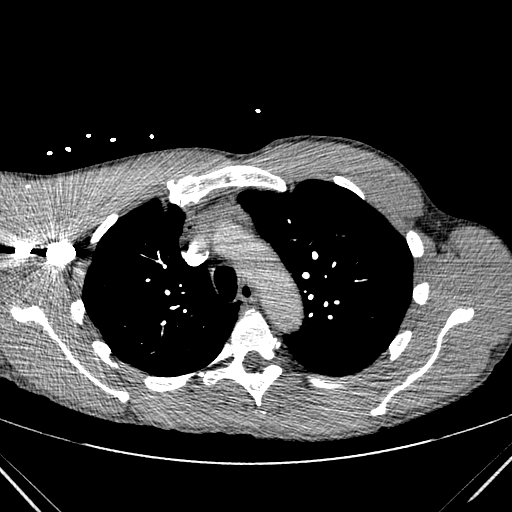
[im 221/306  lung]
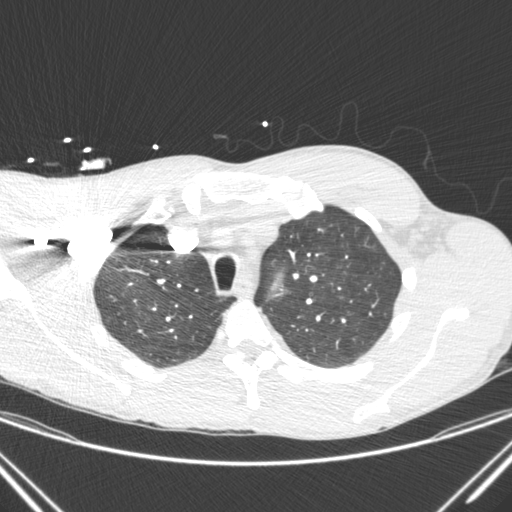
[im 238/306  mediastinal]
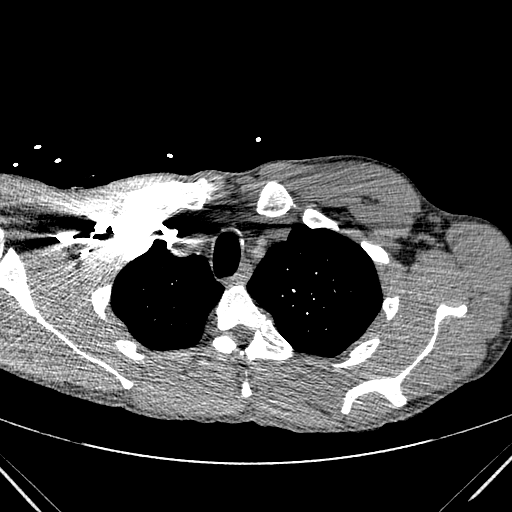
[im 255/306  lung]
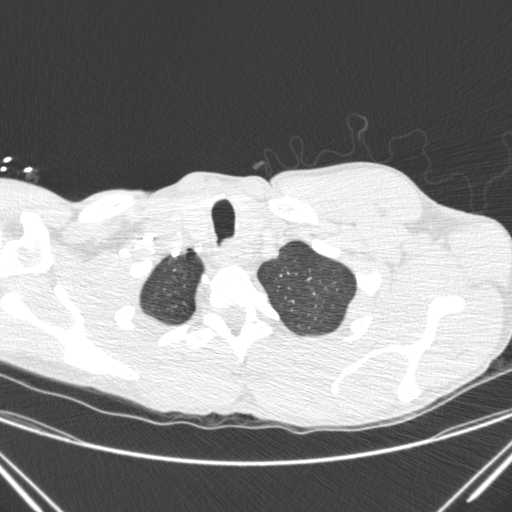
[im 272/306  mediastinal]
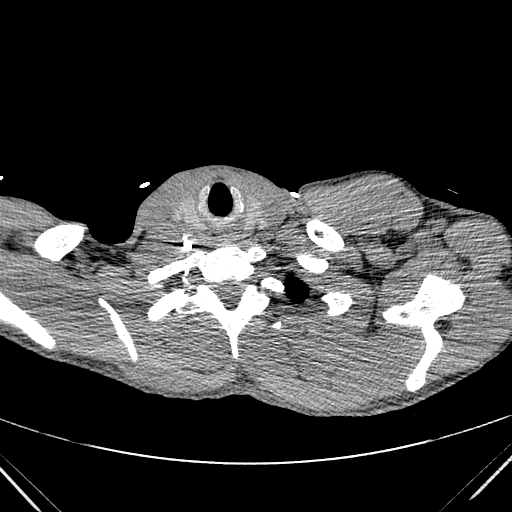
[im 289/306  lung]
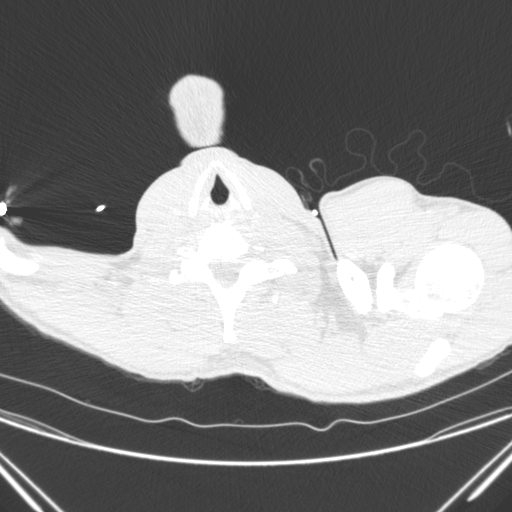

[17 of 36 positions shown; findings below may reference images not displayed]

FINDINGS: Mediastinum/Lymph Nodes: The heart size appears within normal
limits. There is no pericardial effusion. LAD stent noted. The
pulmonary artery appears patent. There is no lobar or segmental
pulmonary artery filling defects identified.

Lungs/Pleura: No pleural fluid identified. There is no airspace
consolidation identified. Mild changes of centrilobular emphysema.
No airspace consolidation or atelectasis noted.

Upper abdomen: There is reflux of contrast material from the right
side of heart into the hepatic veins. Left renal cysts noted.

Musculoskeletal: Spondylosis is present within the thoracic spine.
No aggressive lytic or sclerotic bone lesions.

Review of the MIP images confirms the above findings.
IMPRESSION: 1. No evidence for acute pulmonary embolus.

## 2018-09-12 ENCOUNTER — Encounter (HOSPITAL_COMMUNITY): Payer: Self-pay | Admitting: Emergency Medicine

## 2018-09-12 ENCOUNTER — Emergency Department (HOSPITAL_COMMUNITY)
Admission: EM | Admit: 2018-09-12 | Discharge: 2018-09-13 | Disposition: A | Discharge status: Home / Self Care | Payer: Medicaid Other | Attending: Emergency Medicine | Admitting: Emergency Medicine

## 2018-09-12 ENCOUNTER — Other Ambulatory Visit: Payer: Self-pay

## 2018-09-12 DIAGNOSIS — J45909 Unspecified asthma, uncomplicated: Secondary | ICD-10-CM | POA: Insufficient documentation

## 2018-09-12 DIAGNOSIS — G8929 Other chronic pain: Secondary | ICD-10-CM | POA: Insufficient documentation

## 2018-09-12 DIAGNOSIS — R079 Chest pain, unspecified: Secondary | ICD-10-CM | POA: Insufficient documentation

## 2018-09-12 DIAGNOSIS — R159 Full incontinence of feces: Secondary | ICD-10-CM | POA: Insufficient documentation

## 2018-09-12 DIAGNOSIS — J189 Pneumonia, unspecified organism: Secondary | ICD-10-CM

## 2018-09-12 DIAGNOSIS — I2510 Atherosclerotic heart disease of native coronary artery without angina pectoris: Secondary | ICD-10-CM | POA: Insufficient documentation

## 2018-09-12 DIAGNOSIS — F17210 Nicotine dependence, cigarettes, uncomplicated: Secondary | ICD-10-CM | POA: Insufficient documentation

## 2018-09-12 DIAGNOSIS — I10 Essential (primary) hypertension: Secondary | ICD-10-CM | POA: Diagnosis not present

## 2018-09-12 DIAGNOSIS — R0609 Other forms of dyspnea: Secondary | ICD-10-CM | POA: Diagnosis not present

## 2018-09-12 DIAGNOSIS — M545 Low back pain, unspecified: Secondary | ICD-10-CM

## 2018-09-12 MED ORDER — SODIUM CHLORIDE 0.9% FLUSH: 3.0000 mL | Freq: Once | INTRAVENOUS | Status: DC

## 2018-09-12 NOTE — ED Triage Notes (Signed)
Patient states that his chronic back pain is worse than usual.  No new injury.  Patient states that he has a back brace on and his medications are not helping.  He states he has a degenerative back disease and he has been having chest pain with the back pain for the last four days.

## 2018-09-13 ENCOUNTER — Emergency Department (HOSPITAL_COMMUNITY): Payer: Medicaid Other

## 2018-09-13 LAB — BASIC METABOLIC PANEL
ANION GAP: 12 (ref 5–15)
BUN: 15 mg/dL (ref 6–20)
CO2: 23 mmol/L (ref 22–32)
Calcium: 8.9 mg/dL (ref 8.9–10.3)
Chloride: 104 mmol/L (ref 98–111)
Creatinine, Ser: 1.07 mg/dL (ref 0.61–1.24)
GFR calc Af Amer: >60 mL/min (ref >60)
GLUCOSE: 89 mg/dL (ref 70–99)
Potassium: 3.5 mmol/L (ref 3.5–5.1)
Sodium: 139 mmol/L (ref 135–145)

## 2018-09-13 LAB — HEPATIC FUNCTION PANEL
ALT: 25 U/L (ref 0–44)
AST: 29 U/L (ref 15–41)
Albumin: 3.4 g/dL — ABNORMAL LOW (ref 3.5–5.0)
Alkaline Phosphatase: 40 U/L (ref 38–126)
BILIRUBIN DIRECT: 0.2 mg/dL (ref 0.0–0.2)
BILIRUBIN INDIRECT: 0.9 mg/dL (ref 0.3–0.9)
Total Bilirubin: 1.1 mg/dL (ref 0.3–1.2)
Total Protein: 6.6 g/dL (ref 6.5–8.1)

## 2018-09-13 LAB — CBC
HCT: 41.3 % (ref 39.0–52.0)
Hemoglobin: 13.6 g/dL (ref 13.0–17.0)
MCH: 30.6 pg (ref 26.0–34.0)
MCHC: 32.9 g/dL (ref 30.0–36.0)
MCV: 92.8 fL (ref 80.0–100.0)
NRBC: 0 % (ref 0.0–0.2)
Platelets: 197 10*3/uL (ref 150–400)
RBC: 4.45 MIL/uL (ref 4.22–5.81)
RDW: 12.8 % (ref 11.5–15.5)
WBC: 9.9 10*3/uL (ref 4.0–10.5)

## 2018-09-13 LAB — RAPID URINE DRUG SCREEN, HOSP PERFORMED
Amphetamines: NOT DETECTED
Barbiturates: NOT DETECTED
Benzodiazepines: NOT DETECTED
COCAINE: POSITIVE — AB
OPIATES: NOT DETECTED
TETRAHYDROCANNABINOL: POSITIVE — AB

## 2018-09-13 LAB — LACTIC ACID, PLASMA: LACTIC ACID, VENOUS: 0.9 mmol/L (ref 0.5–1.9)

## 2018-09-13 LAB — INFLUENZA PANEL BY PCR (TYPE A & B)
INFLBPCR: NEGATIVE
Influenza A By PCR: NEGATIVE

## 2018-09-13 LAB — I-STAT TROPONIN, ED: Troponin i, poc: 0.01 ng/mL (ref 0.00–0.08)

## 2018-09-13 MED ORDER — IOPAMIDOL (ISOVUE-370) INJECTION 76%
75.0000 mL | Freq: Once | INTRAVENOUS | Status: AC | PRN
  Administered 2018-09-13: 75 mL via INTRAVENOUS

## 2018-09-13 MED ORDER — LORAZEPAM 2 MG/ML IJ SOLN
1.0000 mg | Freq: Once | INTRAMUSCULAR | Status: AC
  Administered 2018-09-13: 1 mg via INTRAVENOUS
  Filled 2018-09-13: qty 1

## 2018-09-13 MED ORDER — SODIUM CHLORIDE 0.9 % IV SOLN
500.0000 mg | Freq: Once | INTRAVENOUS | Status: AC
  Administered 2018-09-13: 500 mg via INTRAVENOUS
  Filled 2018-09-13: qty 500

## 2018-09-13 MED ORDER — HYDROMORPHONE HCL 1 MG/ML IJ SOLN
1.0000 mg | Freq: Once | INTRAMUSCULAR | Status: AC
  Administered 2018-09-13: 1 mg via INTRAVENOUS
  Filled 2018-09-13: qty 1

## 2018-09-13 MED ORDER — ACETAMINOPHEN 500 MG PO TABS
1000.0000 mg | ORAL_TABLET | Freq: Once | ORAL | Status: AC
  Administered 2018-09-13: 1000 mg via ORAL
  Filled 2018-09-13: qty 2

## 2018-09-13 MED ORDER — IOPAMIDOL (ISOVUE-370) INJECTION 76%
INTRAVENOUS | Status: AC
  Filled 2018-09-13: qty 100

## 2018-09-13 MED ORDER — ACETAMINOPHEN 325 MG PO TABS: 650.0000 mg | ORAL_TABLET | Freq: Four times a day (QID) | ORAL | 0 refills | Status: DC | PRN

## 2018-09-13 MED ORDER — SODIUM CHLORIDE 0.9 % IV SOLN
1.0000 g | Freq: Once | INTRAVENOUS | Status: AC
  Administered 2018-09-13: 1 g via INTRAVENOUS
  Filled 2018-09-13: qty 10

## 2018-09-13 MED ORDER — SODIUM CHLORIDE 0.9 % IV BOLUS
1000.0000 mL | Freq: Once | INTRAVENOUS | Status: AC
  Administered 2018-09-13: 1000 mL via INTRAVENOUS

## 2018-09-13 MED ORDER — DOXYCYCLINE HYCLATE 100 MG PO CAPS: 100.0000 mg | ORAL_CAPSULE | Freq: Two times a day (BID) | ORAL | 0 refills | Status: DC

## 2018-09-13 MED FILL — DOXYCYCLINE HYCLATE 100 MG: 100 | 7 days supply | Qty: 14 | Fill #0

## 2018-09-13 NOTE — ED Notes (Signed)
Pt ambulated in hallway with tech, frail but able to do so with cane and standby assist. Joana Reamer, PA-C aware.

## 2018-09-13 NOTE — Progress Notes (Signed)
CSW spoke with pt at bedside. CSW made aware that pt is from home with lady friend. Pt reported that he has become behind on rent as well as on his water bill. Pt reports that as a result of becoming behind on water they bill they haven without water for a few days. CSW sought further details from pt on how pt became behind on bills. Pt reports that pt has been behind on bills for awhile and when pt does get check he has to pay the previous months that he missed.    CSW reached out to [employer] to assist pt in getting a appointment to meet with someone from the agency in order to get further assistance with rent and other bills. CSW left message for Emergency Assistance Intake Worker asking that she please follow up with pt for further needs. CSW verbalized to pt what pt would need to do once call has been received from [facility]. Pt expressed being understanding and voiced really just wanting to get caught up on bills. CSW expressed to pt the barriers in hospital providing pt with monetary assistance for water bill and pt expressed understanding as well a a desire to follow up with [facility] ASAP.      Claude Manges Teonia Yager, MSW, LCSW-A Emergency Department Clinical Social Worker [phone]

## 2018-09-13 NOTE — ED Provider Notes (Signed)
7:20 AM Patient signed out from [place] PA-C at shift change.   Patient with chest pain and back pain. CT angio of the chest was negative for PE but shows multifocal PNA. MRI of the t-spine and l-spine without emergent findings.   Pt updated on results. He is sleepy and receiving abx.   7:30 AM per RN, attempted to ambulate patient.  Patient is sleepy and refuses to walk at this time.  9:37 AM Per RN, stable walking.   10:14 AM Spoke with patient. Asking to talk with CSW.  He has a place to live but states that he spent his money on rent and for electricity.  Currently does not have any water.  He tells me he will not be able to fill his antibiotic prescriptions.  I have spoken with the social worker who will see.  We will also speak with case management regarding medications.  Patient updated on results.  BP 104/74   Pulse 60   Temp 99.1 F (37.3 C) (Oral)   Resp 17   SpO2 96%    11:24 AM Pt has been seen by CM and CSW. Home with rx for doxy.   Encourage patient to follow-up with his primary care doctor in the next 48 hours for recheck.  If he develops worsening shortness of breath, chest pain, new symptoms, he should return to the emergency department for further evaluation.  BP 113/77 (BP Location: Left Arm)   Pulse 64   Temp 98.2 F (36.8 C) (Oral)   Resp 18   SpO2 100%     Renne Crigler, PA-C 09/13/18 1124    Tegeler, Canary Brim, MD 09/13/18 [phone]

## 2018-09-13 NOTE — ED Provider Notes (Signed)
[facility] Note   CSN: [id number] Arrival date & time: 09/12/18  2317     History   Chief Complaint Chief Complaint  Patient presents with  . Chest Pain  . Back Pain    HPI Timothy Peck is a 58 y.o. male with a hx of asthma, bilateral PE (2014 on Xarelto), coronary artery disease status post angioplasty in 2013, NSTEMI 2014, cervical degenerative disc disease, chronic lower back pain, depression, CVA, hypertension, polysubstance abuse including cocaine and marijuana, history of medication noncompliance presents to the Emergency Department complaining of gradual, persistent, progressively worsening of his chronic low back pain onset 4 days ago.  Patient denies new injury including falls or known trauma.  Patient reports he has been wearing his back brace and taking his pain medications without relief.  Pt reports several episodes of bowel/bladder incontinence.  Pt reports subjective fevers, cough, nasal congestion, ST, rhinorrhea.  Pt denies hx of IVDU.     Additionally, patient reports associated anterior chest pain which has been persistent for the last several days.  He reports associated URI symptoms including chills, cough, nasal congestion, postnasal drip and sore throat.  Patient reports sniffing and dyspnea on exertion and worsening chest pain with walking even a few steps.  He reports a history of pulmonary embolism but has not taken his Xarelto since August 2019.  He reports he does not have a primary care provider.  He denies specific travel or periods of immobilization, lower leg swelling.      Record review shows last cath by Dr. Herbie BaltimoreHarding on 07/08/2016. Cath showed some mild in-stent restenosis, fixed inferioapical defect, small occluded distal PDA. It was felt that chest pain during that hospitalization was likely to be nonanginal and also was not consistent with coronary vasospasms. Echo on 07/04/2016 showed EF 65-70%. Cardiologist is  Dr. Herbie BaltimoreHarding.  The history is provided by the patient and medical records. No language interpreter was used.    Past Medical History:  Diagnosis Date  . Asthma   . Bilateral pulmonary embolism [facility]) August 2014  . CAD S/P percutaneous coronary angioplasty 02/2012   NSTEMI - PCI to prox LAD  . Chronic anticoagulation December 2014   Switch from Xarelto to warfarin.  . Degenerative disc disease, cervical    Dx years ago  . Depression   . 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 Hypertension   . NSTEMI (non-ST elevated myocardial infarction) (HCC)    07/18/13, stable cath with patent stent; preserved EF by echo; follow-up Echo for CVA 09/2013: EF 60%, mild LVH. No RDW and a. No cardiac source for embolism noted. Bubble study was not performed  . NSTEMI (non-ST elevated myocardial infarction), 07/18/13, stable cath with patent stent. 01/31/2012  . OSA (obstructive sleep apnea)    Diagnosis long ago, used to use CPAP, not currently on any treatment.  . Polysubstance abuse (HCC)    Past history of cocaine and marijuana, both smoking. Also long standing tobacco abuse.   . Presence of drug coated stent in LAD coronary artery 02/2012   Promus Element DES 4.0 mm x 28 mm; converted from Brilinta to aspirin during stroke evaluation in February 2015    Patient Active Problem List   Diagnosis Date Noted  . Cocaine dependence, uncomplicated (HCC) 02/10/2018  . DDD (degenerative disc disease), thoracolumbar 07/16/2016  . Essential hypertension 04/21/2016  . Cocaine abuse (HCC)   . Left-sided weakness   . Shortness of breath   .  Chest pain with moderate risk for cardiac etiology 10/04/2014  . Noncompliance with medications 09/21/2014  . H/o ischemic right ica stroke 09/20/2014  . 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 Long term current use of anticoagulant therapy 10/11/2013  . Chronic anticoagulation, since 03/2013 for PE 07/19/2013  . Chest  pain, musculoskeletal 07/18/2013  . Bilateral pulmonary embolism (HCC) 03/14/2013  . Blood in stool, resolved 01/11/2013  . CAD S/P percutaneous coronary angioplasty 02/21/12 non-STEMI -Promus DES 4.0 mm 20 mm to prox LAD 12/14/2012    Class: Diagnosis of  . Dyslipidemia, goal LDL below 70 12/14/2012  . Bradycardia 02/03/2012  . NSTEMI (non-ST elevated myocardial infarction), 07/18/13, stable cath with patent stent. 01/31/2012  . Substance abuse (HCC) 01/31/2012  . Alcohol abuse 01/31/2012  . Tobacco abuse 01/31/2012  . Herniation of cervical intervertebral disc with radiculopathy 11/28/2011    Past Surgical History:  Procedure Laterality Date  . ANTERIOR CERVICAL DECOMP/DISCECTOMY FUSION  11/28/2011   Procedure: ANTERIOR CERVICAL DECOMPRESSION/DISCECTOMY FUSION 1 LEVEL;  Surgeon: Temple Pacini, MD;  Location: [facility]URO [facility]S;  Service: Neurosurgery;  Laterality: N/A;  Anterior Cervical Six-Seven Decompression and Fusion  . CARDIAC CATHETERIZATION N/A 07/08/2016   Procedure: Left Heart Cath and Coronary Angiography;  Surgeon: Marykay Lex, MD;  Location: [facility] INVASIVE CV LAB;  Service: Cardiovascular;  Laterality: N/A;  . LEFT HEART CATHETERIZATION WITH CORONARY ANGIOGRAM N/A 01/31/2012   Procedure: LEFT HEART CATHETERIZATION WITH CORONARY ANGIOGRAM;  Surgeon: Runell Gess, MD;  Location: [facility] CATH LAB;  Service: Cardiovascular: NSTEMI -  p-mLAD ~80% thrombotic lesion -> treated with [id number] Inhibitor & staged PCI  . LEFT HEART CATHETERIZATION WITH CORONARY ANGIOGRAM N/A 12/17/2012   Procedure: LEFT HEART CATHETERIZATION WITH CORONARY ANGIOGRAM;  Surgeon: Marykay Lex, MD;  Location: [facility] CATH LAB;  Service: Cardiovascular: Patent LAD DES.  CP thought to be Non-anginal  . LEFT HEART CATHETERIZATION WITH CORONARY ANGIOGRAM N/A 07/18/2013   Procedure: LEFT HEART CATHETERIZATION WITH CORONARY ANGIOGRAM;  Surgeon: Marykay Lex, MD;  Location: [facility] CATH LAB;  Service: Cardiovascular: patent stent,  stable CAD.  ? Sx related to Myopericarditis.  Marland Kitchen PERCUTANEOUS CORONARY STENT INTERVENTION (PCI-S) N/A 02/02/2012   Procedure: PERCUTANEOUS CORONARY STENT INTERVENTION (PCI-S);  Surgeon: Marykay Lex, MD;  Location: [facility] CATH LAB;  Service: Cardiovascular: Staged LAD PCI - Promus DES 4x28.    . TUMOR REMOVAL     left foot third toe        Home Medications    Prior to Admission medications   Medication Sig Start Date End Date Taking? Authorizing Provider  atorvastatin (LIPITOR) 80 MG tablet Take 1 tablet (80 mg total) by mouth at bedtime. Patient not taking: Reported on 09/13/2018 02/10/18   Hoy Register, MD  gabapentin (NEURONTIN) 300 MG capsule Take 1 capsule (300 mg total) by mouth 2 (two) times daily. Patient not taking: Reported on 09/13/2018 02/10/18   Hoy Register, MD  isosorbide dinitrate (ISORDIL) 20 MG tablet Take 1 tablet (20 mg total) by mouth 2 (two) times daily. Patient not taking: Reported on 09/13/2018 02/10/18   Hoy Register, MD  methocarbamol (ROBAXIN) 500 MG tablet Take 1 tablet (500 mg total) by mouth every 8 (eight) hours as needed for muscle spasms. Patient not taking: Reported on 09/13/2018 02/10/18   Hoy Register, MD  mometasone-formoterol (DULERA) 100-5 MCG/ACT AERO Inhale 2 puffs into the lungs 2 (two) times daily. Patient not taking: Reported on 09/13/2018 02/10/18   Newlin,  Odette Horns, MD  rivaroxaban (XARELTO) 20 MG TABS tablet Take 1 tablet (20 mg total) by mouth daily with supper. Patient not taking: Reported on 09/13/2018 02/10/18   Hoy Register, MD  traMADol (ULTRAM) 50 MG tablet Take 1 tablet (50 mg total) by mouth every 12 (twelve) hours as needed. Patient not taking: Reported on 02/10/2018 07/30/16   Hoy Register, MD    Family History Family History  Problem Relation Age of Onset  . Coronary artery disease Father        died of MI  . Stroke Father 9  . Heart disease Father   . Diabetes Father   . Aneurysm Mother 45       intracranial aneurysm  rupture  . Hypertension Mother   . Hypertension Maternal Grandmother   . Diabetes Maternal Grandmother   . Hypertension Paternal Grandmother   . Hypertension Paternal Grandfather   . Cancer Maternal Aunt   . Anesthesia problems Neg Hx   . Hypotension Neg Hx   . Malignant hyperthermia Neg Hx   . Pseudochol deficiency Neg Hx     Social History Social History   Tobacco Use  . Smoking status: Current Every Day Smoker    Packs/day: 1.00    Years: 42.00    Pack years: 42.00    Types: Cigarettes    Start date: 01/12/1971  . Smokeless tobacco: Never Used  . Tobacco comment: 3 cigs daily  Substance Use Topics  . Alcohol use: No    Comment: Report being abstinent since ~January 2015  . Drug use: Yes    Types: Cocaine, Marijuana, Other-see comments    Comment: last used a month ago     Allergies   Ibuprofen   Review of Systems Review of Systems  Constitutional: Positive for fatigue and fever. Negative for appetite change, diaphoresis and unexpected weight change.  HENT: Positive for congestion, postnasal drip, rhinorrhea, sinus pressure and sore throat. Negative for mouth sores.   Eyes: Negative for visual disturbance.  Respiratory: Positive for cough. Negative for chest tightness, shortness of breath and wheezing.   Cardiovascular: Positive for chest pain.  Gastrointestinal: Negative for abdominal pain, constipation, diarrhea, nausea and vomiting.  Endocrine: Negative for polydipsia, polyphagia and polyuria.  Genitourinary: Negative for dysuria, frequency, hematuria and urgency.  Musculoskeletal: Positive for back pain. Negative for neck pain and neck stiffness.  Skin: Negative for rash.  Allergic/Immunologic: Negative for immunocompromised state.  Neurological: Negative for syncope and light-headedness.  Hematological: Does not bruise/bleed easily.  Psychiatric/Behavioral: Negative for sleep disturbance. The patient is not nervous/anxious.      Physical Exam Updated  Vital Signs BP (!) 165/91 (BP Location: Left Wrist)   Pulse 93   Temp 98.1 F (36.7 C) (Oral)   Resp 20   SpO2 98%   Physical Exam Vitals signs and nursing note reviewed. Exam conducted with a chaperone present.  Constitutional:      General: He is not in acute distress.    Appearance: He is well-developed. He is not diaphoretic.     Comments: Awake, alert, nontoxic appearance  HENT:     Head: Normocephalic and atraumatic.     Mouth/Throat:     Pharynx: No oropharyngeal exudate.  Eyes:     General: No scleral icterus.    Conjunctiva/sclera: Conjunctivae normal.  Neck:     Musculoskeletal: Normal range of motion and neck supple.     Comments: Full ROM without pain Cardiovascular:     Rate and Rhythm: Normal rate and  regular rhythm.     Pulses:          Radial pulses are 2+ on the right side and 2+ on the left side.       Posterior tibial pulses are 2+ on the right side and 2+ on the left side.  Pulmonary:     Effort: Pulmonary effort is normal. No respiratory distress.     Breath sounds: Rhonchi ( throughout) present. No wheezing.     Comments: Adjusted cough, productive of [place] sputum. Chest:     Chest wall: No tenderness.  Abdominal:     General: Bowel sounds are normal. There is no distension.     Palpations: Abdomen is soft. There is no mass.     Tenderness: There is no abdominal tenderness. There is no guarding or rebound.  Genitourinary:    Rectum: No mass, tenderness, anal fissure, external hemorrhoid or internal hemorrhoid. Normal anal tone.     Comments: Normal rectal tone Musculoskeletal: Normal range of motion.     Comments: Somewhat decreased range of motion of T-spine and L-spine due to pain. Patient with significant tenderness to palpation along the midline and paraspinal muscles of the entire T-spine and L-spine.  Patient crying with palpation of his back.  No one specific space hurts worse than others.  Additionally, tender along the right SI joint.      Lymphadenopathy:     Cervical: No cervical adenopathy.  Skin:    General: Skin is warm and dry.     Findings: No erythema or rash.  Neurological:     Mental Status: He is alert.     Comments: Speech is clear and goal oriented, follows commands Normal 5/5 strength in upper extremities bilaterally including strong and equal grip strength.  5/5 strength with dorsiflexion and plantarflexion in the bilateral lower extremities, 4/5 strength at the hips when attempting to lift legs off the bed.  Patient also reports severe pain with this. Sensation normal to light touch in all extremities. Moves extremities without ataxia, coordination intact Gait deferred as patient reports he cannot walk at all. No clonus.  Psychiatric:        Behavior: Behavior normal.      ED Treatments / Results  Labs (all labs ordered are listed, but only abnormal results are displayed) Labs Reviewed  HEPATIC FUNCTION PANEL - Abnormal; Notable for the following components:      Result Value   Albumin 3.4 (*)    All other components within normal limits  BASIC METABOLIC PANEL  CBC  INFLUENZA PANEL BY PCR (TYPE A & B)  LACTIC ACID, PLASMA  RAPID URINE DRUG SCREEN, HOSP PERFORMED  I-STAT TROPONIN, ED    EKG EKG Interpretation  Date/Time:  Sunday September 12 2018 23:53:31 EST Ventricular Rate:  72 PR Interval:  146 QRS Duration: 74 QT Interval:  372 QTC Calculation: 407 R Axis:   82 Text Interpretation:  Normal sinus rhythm Right atrial enlargement Borderline ECG Artifact Otherwise no significant change Confirmed by Drema Pryardama, Pedro [phone]([id number]) on 09/13/2018 2:31:40 AM   Radiology Dg Chest 2 View  Result Date: 09/13/2018 CLINICAL DATA:  58 y/o  M; four days of cough and fever. EXAM: CHEST - 2 VIEW COMPARISON:  07/19/2016 chest radiograph. FINDINGS: Stable cardiac silhouette within normal limits given projection and technique. Ill-defined left lower lobe opacity. No pleural effusion or pneumothorax. No acute  osseous abnormality is evident. IMPRESSION: Ill-defined left lower lobe opacity may represent developing pneumonia. Electronically Signed   By:  Mitzi Hansen M.D.   On: 09/13/2018 00:19   Ct Angio Chest Pe W And/or Wo Contrast  Result Date: 09/13/2018 CLINICAL DATA:  Chest pain and shortness of breath for 1 week EXAM: CT ANGIOGRAPHY CHEST WITH CONTRAST TECHNIQUE: Multidetector CT imaging of the chest was performed using the standard protocol during bolus administration of intravenous contrast. Multiplanar CT image reconstructions and MIPs were obtained to evaluate the vascular anatomy. CONTRAST:  6mL ISOVUE-370 IOPAMIDOL (ISOVUE-370) INJECTION 76% COMPARISON:  07/02/2016 FINDINGS: Cardiovascular: Satisfactory opacification of the pulmonary arteries to the segmental level. No evidence of pulmonary embolism. Normal heart size. No pericardial effusion. Left coronary stent at the LAD. Mediastinum/Nodes: Negative for adenopathy Lungs/Pleura: Multifocal airspace disease in the lower lungs, most confluent in the left lower lobe. There is associated airway thickening. Upper Abdomen: Partial coverage of a left renal cystic density. Musculoskeletal: No acute finding Review of the MIP images confirms the above findings. IMPRESSION: 1. Multifocal pneumonia. 2. Negative for pulmonary embolism. Electronically Signed   By: Marnee Spring M.D.   On: 09/13/2018 05:23     Procedures Procedures (including critical care time)  Medications Ordered in ED Medications  sodium chloride flush (NS) 0.9 % injection 3 mL (3 mLs Intravenous Not Given 09/13/18 0616)  cefTRIAXone (ROCEPHIN) 1 g in sodium chloride 0.9 % 100 mL IVPB (1 g Intravenous New Bag/Given 09/13/18 0702)  azithromycin (ZITHROMAX) 500 mg in sodium chloride 0.9 % 250 mL IVPB (500 mg Intravenous New Bag/Given 09/13/18 0703)  sodium chloride 0.9 % bolus 1,000 mL (0 mLs Intravenous Stopped 09/13/18 0617)  HYDROmorphone (DILAUDID) injection 1 mg (1 mg  Intravenous Given 09/13/18 0352)  acetaminophen (TYLENOL) tablet 1,000 mg (1,000 mg Oral Given 09/13/18 0352)  iopamidol (ISOVUE-370) 76 % injection 75 mL (75 mLs Intravenous Contrast Given 09/13/18 0444)  LORazepam (ATIVAN) injection 1 mg (1 mg Intravenous Given 09/13/18 0540)     Initial Impression / Assessment and Plan / ED Course  I have reviewed the triage vital signs and the nursing notes.  Pertinent labs & imaging results that were available during my care of the patient were reviewed by me and considered in my medical decision making (see chart for details).  Clinical Course as of Sep 13 708  Mon Sep 13, 2018  0251 normal  Troponin i, poc: 0.01 [HM]    Clinical Course User Index [HM] Labrenda Lasky, Dahlia Client, [place]   Patient presents with numerous complaints.  He has chest pain, shortness of breath and dyspnea on exertion.  He has a history of pulmonary embolism and is noncompliant with his anticoagulation.  He is also febrile with URI symptoms.  Concern for possible influenza.  Chest x-ray with questionable pneumonia.  CT angiogram obtained to rule out pulmonary embolism.  No evidence of pulmonary embolism however multifocal pneumonia is noted.  Patient has no history of HIV or immunocompromise.  Likely community-acquired pneumonia.  Patient arrives febrile without tachycardia or hypotension.  No evidence of sepsis or Sirs.  Initial lactic acid is within normal limits.  Troponin negative.  Influenza negative.  Additionally, patient with T-spine and L-spine back pain.  Acute on chronic back pain without known trauma.  Patient denies IV drug use or previous spinal infection or hemorrhage.  Patient is endorsing intermittent urinary and fecal incontinence.  Normal rectal tone however concern for possible epidural abscess versus cauda equina.  MRI of the T-spine and L-spine have been ordered.   At shift change care was transferred to [facility], PA-C who will  follow pending studies (MRI  T-spine and L-spine), re-evaulate and determine disposition.     Final Clinical Impressions(s) / ED Diagnoses   Final diagnoses:  Acute midline low back pain without sciatica  Central chest pain  Dyspnea on exertion  Incontinence of feces, unspecified fecal incontinence type    ED Discharge Orders    None       Mardene Sayer Boyd Kerbs 09/13/18 1610    Nira Conn, MD 09/13/18 [id number]

## 2018-09-13 NOTE — ED Notes (Signed)
Pt dc in good condition. No s/sx distress. Dcd with antibiotic

## 2018-09-13 NOTE — ED Notes (Signed)
Pt too drowsy to ambulate at this time. Notified Joana Reamer, PA-C who states we will let patient sleep and attempt later in the morning.

## 2018-09-13 NOTE — Discharge Planning (Signed)
[facility] consulted in regards to medication assistance.  Pt has insurance coverage (Medicaid with $3 co-pay) and is not eligible for medication assistance programs.

## 2018-09-13 NOTE — Discharge Instructions (Signed)
Please read and follow all provided instructions.  Your diagnoses today include:  1. Multifocal pneumonia   2. Acute midline low back pain without sciatica   3. Central chest pain     Tests performed today include:  An EKG of your heart  A chest x-ray  Cardiac enzymes - a blood test for heart muscle damage  Blood counts and electrolytes  CAT scan of your chest -does not show any blood clots but shows multifocal pneumonia  MRI of your back -does not show any emergencies, you have some bulging disks  Vital signs. See below for your results today.   Medications prescribed:   Doxycycline - antibiotic  You have been prescribed an antibiotic medicine: take the entire course of medicine even if you are feeling better. Stopping early can cause the antibiotic not to work.  Take any prescribed medications only as directed.  Follow-up instructions: Please follow-up with your primary care provider as soon as you can for further evaluation of your symptoms.   Return instructions:  SEEK IMMEDIATE MEDICAL ATTENTION IF:  You have severe chest pain, especially if the pain is crushing or pressure-like and spreads to the arms, back, neck, or jaw, or if you have sweating, nausea (feeling sick to your stomach), or shortness of breath. THIS IS AN EMERGENCY. Don't wait to see if the pain will go away. Get medical help at once. Call 911 or 0 (operator). DO NOT drive yourself to the hospital.   Your chest pain gets worse and does not go away with rest.   You have an attack of chest pain lasting longer than usual, despite rest and treatment with the medications your caregiver has prescribed.   You wake from sleep with chest pain or shortness of breath.  You feel dizzy or faint.  You have chest pain not typical of your usual pain for which you originally saw your caregiver.   You have any other emergent concerns regarding your health.  Additional Information: Chest pain comes from many  different causes. Your caregiver has diagnosed you as having chest pain that is not specific for one problem, but does not require admission.  You are at low risk for an acute heart condition or other serious illness.   Your vital signs today were: BP 104/74    Pulse 60    Temp 99.1 F (37.3 C) (Oral)    Resp 17    SpO2 96%  If your blood pressure (BP) was elevated above 135/85 this visit, please have this repeated by your doctor within one month. --------------

## 2018-09-13 NOTE — ED Notes (Signed)
Pulse Oximetry while ambulating completed: PT seems week walking with cane, his sat was 97-98% during the whole walk, respirations went from 18 on rest to 30 while walking, and his pulse was in normal range 72-89bpm.

## 2018-12-27 ENCOUNTER — Emergency Department (HOSPITAL_COMMUNITY): Payer: Medicaid Other

## 2018-12-27 ENCOUNTER — Emergency Department (HOSPITAL_COMMUNITY)
Admission: EM | Admit: 2018-12-27 | Discharge: 2018-12-27 | Disposition: A | Discharge status: Home / Self Care | Payer: Medicaid Other | Attending: Emergency Medicine | Admitting: Emergency Medicine

## 2018-12-27 ENCOUNTER — Encounter (HOSPITAL_COMMUNITY): Payer: Self-pay

## 2018-12-27 ENCOUNTER — Other Ambulatory Visit: Payer: Self-pay

## 2018-12-27 DIAGNOSIS — R531 Weakness: Secondary | ICD-10-CM | POA: Insufficient documentation

## 2018-12-27 DIAGNOSIS — I10 Essential (primary) hypertension: Secondary | ICD-10-CM | POA: Insufficient documentation

## 2018-12-27 DIAGNOSIS — J45909 Unspecified asthma, uncomplicated: Secondary | ICD-10-CM | POA: Insufficient documentation

## 2018-12-27 DIAGNOSIS — R202 Paresthesia of skin: Secondary | ICD-10-CM | POA: Insufficient documentation

## 2018-12-27 DIAGNOSIS — F17210 Nicotine dependence, cigarettes, uncomplicated: Secondary | ICD-10-CM | POA: Insufficient documentation

## 2018-12-27 DIAGNOSIS — I252 Old myocardial infarction: Secondary | ICD-10-CM | POA: Insufficient documentation

## 2018-12-27 DIAGNOSIS — R432 Parageusia: Secondary | ICD-10-CM

## 2018-12-27 DIAGNOSIS — I2510 Atherosclerotic heart disease of native coronary artery without angina pectoris: Secondary | ICD-10-CM | POA: Diagnosis not present

## 2018-12-27 DIAGNOSIS — R42 Dizziness and giddiness: Secondary | ICD-10-CM | POA: Insufficient documentation

## 2018-12-27 LAB — COMPREHENSIVE METABOLIC PANEL
ALT: 23 U/L (ref 0–44)
AST: 25 U/L (ref 15–41)
Albumin: 4 g/dL (ref 3.5–5.0)
Alkaline Phosphatase: 60 U/L (ref 38–126)
Anion gap: 9 (ref 5–15)
BUN: 17 mg/dL (ref 6–20)
CO2: 24 mmol/L (ref 22–32)
Calcium: 9.2 mg/dL (ref 8.9–10.3)
Chloride: 108 mmol/L (ref 98–111)
Creatinine, Ser: 1.26 mg/dL — ABNORMAL HIGH (ref 0.61–1.24)
GFR calc Af Amer: >60 mL/min (ref >60)
GFR calc non Af Amer: >60 mL/min (ref >60)
Glucose, Bld: 81 mg/dL (ref 70–99)
Potassium: 4 mmol/L (ref 3.5–5.1)
Sodium: 141 mmol/L (ref 135–145)
Total Bilirubin: 0.5 mg/dL (ref 0.3–1.2)
Total Protein: 6.7 g/dL (ref 6.5–8.1)

## 2018-12-27 LAB — CBC WITH DIFFERENTIAL/PLATELET
Abs Immature Granulocytes: 0.03 10*3/uL (ref 0.00–0.07)
Basophils Absolute: 0 10*3/uL (ref 0.0–0.1)
Basophils Relative: 0 %
Eosinophils Absolute: 0.3 10*3/uL (ref 0.0–0.5)
Eosinophils Relative: 5 %
HCT: 45.9 % (ref 39.0–52.0)
Hemoglobin: 15.7 g/dL (ref 13.0–17.0)
Immature Granulocytes: 1 %
Lymphocytes Relative: 33 %
Lymphs Abs: 2.1 10*3/uL (ref 0.7–4.0)
MCH: 32 pg (ref 26.0–34.0)
MCHC: 34.2 g/dL (ref 30.0–36.0)
MCV: 93.7 fL (ref 80.0–100.0)
Monocytes Absolute: 0.6 10*3/uL (ref 0.1–1.0)
Monocytes Relative: 9 %
Neutro Abs: 3.3 10*3/uL (ref 1.7–7.7)
Neutrophils Relative %: 52 %
Platelets: 223 10*3/uL (ref 150–400)
RBC: 4.9 MIL/uL (ref 4.22–5.81)
RDW: 12.9 % (ref 11.5–15.5)
WBC: 6.3 10*3/uL (ref 4.0–10.5)
nRBC: 0 % (ref 0.0–0.2)

## 2018-12-27 LAB — I-STAT CHEM 8, ED
BUN: 23 mg/dL — ABNORMAL HIGH (ref 6–20)
Calcium, Ion: 1.03 mmol/L — ABNORMAL LOW (ref 1.15–1.40)
Chloride: 111 mmol/L (ref 98–111)
Creatinine, Ser: 1.4 mg/dL — ABNORMAL HIGH (ref 0.61–1.24)
Glucose, Bld: 76 mg/dL (ref 70–99)
HCT: 48 % (ref 39.0–52.0)
Hemoglobin: 16.3 g/dL (ref 13.0–17.0)
Potassium: 4.1 mmol/L (ref 3.5–5.1)
Sodium: 142 mmol/L (ref 135–145)
TCO2: 24 mmol/L (ref 22–32)

## 2018-12-27 LAB — URINALYSIS, ROUTINE W REFLEX MICROSCOPIC
Bacteria, UA: NONE SEEN
Bilirubin Urine: NEGATIVE
Glucose, UA: NEGATIVE mg/dL
Ketones, ur: NEGATIVE mg/dL
Leukocytes,Ua: NEGATIVE
Nitrite: NEGATIVE
Protein, ur: NEGATIVE mg/dL
Specific Gravity, Urine: 1.025 (ref 1.005–1.030)
pH: 6 (ref 5.0–8.0)

## 2018-12-27 LAB — ETHANOL: Alcohol, Ethyl (B): <10 mg/dL (ref <10)

## 2018-12-27 LAB — PROTIME-INR
INR: 1.1 (ref 0.8–1.2)
Prothrombin Time: 13.6 seconds (ref 11.4–15.2)

## 2018-12-27 LAB — APTT: aPTT: 28 seconds (ref 24–36)

## 2018-12-27 LAB — RAPID URINE DRUG SCREEN, HOSP PERFORMED
Amphetamines: NOT DETECTED
Barbiturates: NOT DETECTED
Benzodiazepines: NOT DETECTED
Cocaine: POSITIVE — AB
Opiates: NOT DETECTED
Tetrahydrocannabinol: POSITIVE — AB

## 2018-12-27 NOTE — ED Notes (Signed)
Called MRI - pt is to be next.

## 2018-12-27 NOTE — ED Notes (Signed)
Attempted redraw unsuccessfully for labs.

## 2018-12-27 NOTE — ED Notes (Signed)
Redrew CBC, lab called and all other samples are hemolyzed. Phlebotomy notified to redraw all labs.

## 2018-12-27 NOTE — ED Notes (Signed)
Prepped pt for MRI

## 2018-12-27 NOTE — ED Notes (Signed)
Pt reminded of the need for urine.  

## 2018-12-27 NOTE — ED Notes (Signed)
Pt discharged from ED; instructions provided; Pt encouraged to return to ED if symptoms worsen and to f/u with PCP; Pt verbalized understanding of all instructions 

## 2018-12-27 NOTE — ED Triage Notes (Signed)
[facility] reports pt began having left sided numbness and weakness with a chemical metallic taste after eating breakfast.

## 2018-12-27 NOTE — ED Notes (Signed)
Patient transported to CT 

## 2018-12-27 NOTE — ED Provider Notes (Signed)
[facility] EMERGENCY DEPARTMENT Provider Note   CSN: [id number] Arrival date & time: 12/27/18  1630    History   Chief Complaint Chief Complaint  Patient presents with   Weakness   Numbness   Tingling    HPI Timothy Peck is a 58 y.o. male who presents with generalized weakness, altered taste and parethesias. PMH significant for CAD, on Xarelto, DDD, hx of CVA, hx of NSTEMI, drug abuse. The patient states that he woke up around 2pm today and ate spaghetti and bread and soon after he started to report a metallic taste in his mouth that has been persistent. He reports associated lightheadedness/dizziness, blurry vision, numbness of his entire left arm that spread from his hand to his shoulder. He states that he has been generally weak for a while but feels very weak today, especially in his left arm. He endorses cocaine and THC use recently about three days ago. No new meds or OTC meds. He is having some chest discomfort which he describes as a "pain in the cavity". He states this is different than when he had an MI which felt more like a pressure. He denies fever, headache, SOB, abdominal pain, leg weakness or paresthesias. No recent tick bites     HPI  Past Medical History:  Diagnosis Date   Asthma    Bilateral pulmonary embolism [facility]) August 2014   CAD S/P percutaneous coronary angioplasty 02/2012   NSTEMI - PCI to prox LAD   Chronic anticoagulation December 2014   Switch from Xarelto to warfarin.   Degenerative disc disease, cervical    Dx years ago   Depression    H/o ischemic right ica stroke February 2015   Hypertension    NSTEMI (non-ST elevated myocardial infarction) (HCC)    07/18/13, stable cath with patent stent; preserved EF by echo; follow-up Echo for CVA 09/2013: EF 60%, mild LVH. No RDW and a. No cardiac source for embolism noted. Bubble study was not performed   NSTEMI (non-ST elevated myocardial infarction), 07/18/13, stable cath  with patent stent. 01/31/2012   OSA (obstructive sleep apnea)    Diagnosis long ago, used to use CPAP, not currently on any treatment.   Polysubstance abuse (HCC)    Past history of cocaine and marijuana, both smoking. Also long standing tobacco abuse.    Presence of drug coated stent in LAD coronary artery 02/2012   Promus Element DES 4.0 mm x 28 mm; converted from Brilinta to aspirin during stroke evaluation in February 2015    Patient Active Problem List   Diagnosis Date Noted   Cocaine dependence, uncomplicated (HCC) 02/10/2018   DDD (degenerative disc disease), thoracolumbar 07/16/2016   Essential hypertension 04/21/2016   Cocaine abuse (HCC)    Left-sided weakness    Shortness of breath    Chest pain with moderate risk for cardiac etiology 10/04/2014   Noncompliance with medications 09/21/2014   H/o ischemic right ica stroke 09/20/2014   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 01/31/2012   Substance abuse (HCC) 01/31/2012   Alcohol abuse 01/31/2012   Tobacco abuse 01/31/2012   Herniation of cervical intervertebral disc with radiculopathy 11/28/2011    Past Surgical History:  Procedure Laterality Date   ANTERIOR CERVICAL DECOMP/DISCECTOMY FUSION  11/28/2011   Procedure: ANTERIOR CERVICAL DECOMPRESSION/DISCECTOMY FUSION 1 LEVEL;  Surgeon: Temple PaciniHenry A Pool, MD;  Location: [facility] NEURO  ORS;  Service: Neurosurgery;  Laterality: N/A;  Anterior Cervical Six-Seven Decompression and Fusion   CARDIAC CATHETERIZATION N/A 07/08/2016   Procedure: Left Heart Cath and Coronary Angiography;  Surgeon: Marykay Lexavid W Harding, MD;  [facility];  Service: Cardiovascular;  Laterality: N/A;   LEFT HEART CATHETERIZATION WITH CORONARY ANGIOGRAM N/A 01/31/2012   Procedure: LEFT HEART CATHETERIZATION WITH CORONARY ANGIOGRAM;  Surgeon: Runell GessJonathan J Berry, MD;  [facility];  Service: Cardiovascular: NSTEMI -  p-mLAD ~80% thrombotic lesion -> treated with [id number] Inhibitor & staged PCI   LEFT HEART CATHETERIZATION WITH CORONARY ANGIOGRAM N/A 12/17/2012   Procedure: LEFT HEART CATHETERIZATION WITH CORONARY ANGIOGRAM;  Surgeon: Marykay Lexavid W Harding, MD;  [facility];  Service: Cardiovascular: Patent LAD DES.  CP thought to be Non-anginal   LEFT HEART CATHETERIZATION WITH CORONARY ANGIOGRAM N/A 07/18/2013   Procedure: LEFT HEART CATHETERIZATION WITH CORONARY ANGIOGRAM;  Surgeon: Marykay Lexavid W Harding, MD;  [facility];  Service: Cardiovascular: patent stent, stable CAD.  ? Sx related to Myopericarditis.   PERCUTANEOUS CORONARY STENT INTERVENTION (PCI-S) N/A 02/02/2012   Procedure: PERCUTANEOUS CORONARY STENT INTERVENTION (PCI-S);  Surgeon: Marykay Lexavid W Harding, MD;  [facility];  Service: Cardiovascular: Staged LAD PCI - Promus DES 4x28.     TUMOR REMOVAL     left foot third toe        Home Medications    Prior to Admission medications   Medication Sig Start Date End Date Taking? Authorizing Provider  acetaminophen (TYLENOL) 325 MG tablet Take 2 tablets (650 mg total) by mouth every 6 (six) hours as needed. 09/13/18   Renne CriglerGeiple, Joshua, PA-C  atorvastatin (LIPITOR) 80 MG tablet Take 1 tablet (80 mg total) by mouth at bedtime. Patient not taking: Reported on 09/13/2018 02/10/18   Hoy RegisterNewlin, Enobong, MD  doxycycline (VIBRAMYCIN) 100 MG capsule Take 1 capsule (100 mg total) by  mouth 2 (two) times daily. 09/13/18   Renne CriglerGeiple, Joshua, PA-C  gabapentin (NEURONTIN) 300 MG capsule Take 1 capsule (300 mg total) by mouth 2 (two) times daily. Patient not taking: Reported on 09/13/2018 02/10/18   Hoy RegisterNewlin, Enobong, MD  isosorbide dinitrate (ISORDIL) 20 MG tablet Take 1 tablet (20 mg total) by mouth 2 (two) times daily. Patient not taking: Reported on 09/13/2018 02/10/18   Hoy RegisterNewlin, Enobong, MD  methocarbamol (ROBAXIN) 500 MG tablet Take 1 tablet (500 mg total) by mouth every 8 (eight) hours as needed for muscle spasms. Patient not taking: Reported on 09/13/2018 02/10/18   Hoy RegisterNewlin, Enobong, MD  mometasone-formoterol (DULERA) 100-5 MCG/ACT AERO Inhale 2 puffs into the lungs 2 (two) times daily. Patient not taking: Reported on 09/13/2018 02/10/18   Hoy RegisterNewlin, Enobong, MD  rivaroxaban (XARELTO) 20 MG TABS tablet Take 1 tablet (20 mg total) by mouth daily with supper. Patient not taking: Reported on 09/13/2018 02/10/18   Hoy RegisterNewlin, Enobong, MD  traMADol (ULTRAM) 50 MG tablet Take 1 tablet (50 mg total) by mouth every 12 (twelve) hours as needed. Patient not taking: Reported on 02/10/2018 07/30/16   Hoy RegisterNewlin, Enobong, MD  Family History Family History  Problem Relation Age of Onset   Coronary artery disease Father        died of MI   Stroke Father 74   Heart disease Father    Diabetes Father    Aneurysm Mother 10       intracranial aneurysm rupture   Hypertension Mother    Hypertension Maternal Grandmother    Diabetes Maternal Grandmother    Hypertension Paternal Grandmother    Hypertension Paternal Grandfather    Cancer Maternal Aunt    Anesthesia problems Neg Hx    Hypotension Neg Hx    Malignant hyperthermia Neg Hx    Pseudochol deficiency Neg Hx     Social History Social History   Tobacco Use   Smoking status: Current Every Day Smoker    Packs/day: 1.00    Years: 42.00    Pack years: 42.00    Types: Cigarettes    Start date: 01/12/1971   Smokeless tobacco: Never  Used   Tobacco comment: 3 cigs daily  Substance Use Topics   Alcohol use: No    Comment: Report being abstinent since ~January 2015   Drug use: Yes    Types: Cocaine, Marijuana, Other-see comments    Comment: last used a month ago     Allergies   Ibuprofen   Review of Systems Review of Systems  Constitutional: Positive for fatigue. Negative for fever.  HENT:       +dysguesia  Respiratory: Negative for cough and shortness of breath.   Cardiovascular: Positive for chest pain. Negative for palpitations and leg swelling.  Gastrointestinal: Negative for abdominal pain, nausea and vomiting.  Musculoskeletal: Positive for neck pain. Negative for back pain.  Neurological: Positive for dizziness, weakness, light-headedness and numbness. Negative for seizures, syncope, facial asymmetry, speech difficulty and headaches.  Hematological: Bruises/bleeds easily.  All other systems reviewed and are negative.    Physical Exam Updated Vital Signs Temp 97.9 F (36.6 C) (Oral)   Physical Exam Vitals signs and nursing note reviewed.  Constitutional:      General: He is not in acute distress.    Appearance: Normal appearance. He is well-developed. He is not ill-appearing.     Comments: Thin, calm, cooperative  HENT:     Head: Normocephalic and atraumatic.  Eyes:     General: No scleral icterus.       Right eye: No discharge.        Left eye: No discharge.     Conjunctiva/sclera: Conjunctivae normal.     Pupils: Pupils are equal, round, and reactive to light.  Neck:     Musculoskeletal: Normal range of motion.  Cardiovascular:     Rate and Rhythm: Normal rate and regular rhythm.  Pulmonary:     Effort: Pulmonary effort is normal. No respiratory distress.     Breath sounds: Normal breath sounds.  Abdominal:     General: There is no distension.     Palpations: Abdomen is soft.     Tenderness: There is no abdominal tenderness.  Skin:    General: Skin is warm and dry.    Neurological:     Mental Status: He is alert and oriented to person, place, and time.     Comments: Mental Status:  Alert, oriented, thought content appropriate, able to give a coherent history. Speech fluent without evidence of aphasia. Able to follow 2 step commands without difficulty.  Cranial Nerves:  II:  Peripheral visual fields grossly normal, pupils equal, round, reactive  to light III,IV, VI: ptosis not present, extra-ocular motions intact bilaterally  V,VII: smile symmetric, facial light touch sensation equal VIII: hearing grossly normal to voice  X: uvula elevates symmetrically  XI: Unequal shoulder shrug - right is normal, left is abnormal XII: midline tongue extension without fassiculations Motor:  Normal tone. 4/5 in upper left extremity and 5/5 in upper right extremity. 5/5 in bilateral lower extremities.  Sensory: Pt reports decreased sensation in the LUE  Cerebellar: normal finger-to-nose with bilateral upper extremities Gait: ambulatory with a cane CV: distal pulses palpable throughout    Psychiatric:        Mood and Affect: Mood normal.        Behavior: Behavior normal.      ED Treatments / Results  Labs (all labs ordered are listed, but only abnormal results are displayed) Labs Reviewed  RAPID URINE DRUG SCREEN, HOSP PERFORMED - Abnormal; Notable for the following components:      Result Value   Cocaine POSITIVE (*)    Tetrahydrocannabinol POSITIVE (*)    All other components within normal limits  URINALYSIS, ROUTINE W REFLEX MICROSCOPIC - Abnormal; Notable for the following components:   Hgb urine dipstick SMALL (*)    All other components within normal limits  COMPREHENSIVE METABOLIC PANEL - Abnormal; Notable for the following components:   Creatinine, Ser 1.26 (*)    All other components within normal limits  I-STAT CHEM 8, ED - Abnormal; Notable for the following components:   BUN 23 (*)    Creatinine, Ser 1.40 (*)    Calcium, Ion 1.03 (*)    All  other components within normal limits  ETHANOL  CBC WITH DIFFERENTIAL/PLATELET  APTT  PROTIME-INR  TROPONIN I    EKG EKG Interpretation  Date/Time:  Monday Dec 27 2018 16:31:53 EDT Ventricular Rate:  58 PR Interval:    QRS Duration: 84 QT Interval:  454 QTC Calculation: 446 R Axis:   83 Text Interpretation:  Sinus rhythm Right atrial enlargement scooped st depression in lead III Otherwise no significant change Confirmed by Melene Plan [id number]) on 12/27/2018 5:22:40 PM   Radiology Ct Head Wo Contrast  Result Date: 12/27/2018 CLINICAL DATA:  Left-sided numbness and weakness. Metallic taste. History of stroke. EXAM: CT HEAD WITHOUT CONTRAST CT CERVICAL SPINE WITHOUT CONTRAST TECHNIQUE: Multidetector CT imaging of the head and cervical spine was performed following the standard protocol without intravenous contrast. Multiplanar CT image reconstructions of the cervical spine were also generated. COMPARISON:  CT head 07/19/2016. MRI brain 03/10/2016. MR cervical spine 11/17/2011. FINDINGS: CT HEAD FINDINGS Brain: There is no evidence of acute intracranial hemorrhage, mass lesion, brain edema or extra-axial fluid collection. The ventricles and subarachnoid spaces are appropriately sized for age. There is stable multifocal encephalomalacia within the right temporal, parietal and occipital lobes related to an old right MCA stroke. No evidence of acute stroke. Vascular: Mild intracranial atherosclerosis. No hyperdense vessel identified. Skull: Negative for fracture or focal lesion. Sinuses/Orbits: Postsurgical changes medially in the right maxillary sinus with stable left maxillary sinus mucous retention cysts. The visualized paranasal sinuses, mastoid air cells and middle ears are otherwise clear. Other: None. CT CERVICAL SPINE FINDINGS Alignment: Mild kyphosis of the upper cervical spine without focal angulation or listhesis. Skull base and vertebrae: Status post C6-7 ACDF. The hardware is intact  without loosening. Interbody fusion appears solid. No evidence of acute cervical spine fracture or traumatic subluxation. Soft tissues and spinal canal: No prevertebral fluid or swelling. No visible canal hematoma.  Disc levels: There is moderate left foraminal narrowing at C3-4 secondary to uncinate spurring. No other significant spinal stenosis. Solid interbody fusion status post C6-7 ACDF. Mild facet hypertrophy at C7-T1. Upper chest: Unremarkable. Other: None. IMPRESSION: 1. No acute intracranial findings demonstrated. 2. Stable sequela of old right MCA infarct. 3. No evidence of acute cervical spine fracture, traumatic subluxation or static signs of instability. Solid interbody fusion post C6-7 ACDF. Moderate osseous foraminal narrowing on the left at C3-4. Electronically Signed   By: Carey Bullocks M.D.   On: 12/27/2018 17:36   Ct Cervical Spine Wo Contrast  Result Date: 12/27/2018 CLINICAL DATA:  Left-sided numbness and weakness. Metallic taste. History of stroke. EXAM: CT HEAD WITHOUT CONTRAST CT CERVICAL SPINE WITHOUT CONTRAST TECHNIQUE: Multidetector CT imaging of the head and cervical spine was performed following the standard protocol without intravenous contrast. Multiplanar CT image reconstructions of the cervical spine were also generated. COMPARISON:  CT head 07/19/2016. MRI brain 03/10/2016. MR cervical spine 11/17/2011. FINDINGS: CT HEAD FINDINGS Brain: There is no evidence of acute intracranial hemorrhage, mass lesion, brain edema or extra-axial fluid collection. The ventricles and subarachnoid spaces are appropriately sized for age. There is stable multifocal encephalomalacia within the right temporal, parietal and occipital lobes related to an old right MCA stroke. No evidence of acute stroke. Vascular: Mild intracranial atherosclerosis. No hyperdense vessel identified. Skull: Negative for fracture or focal lesion. Sinuses/Orbits: Postsurgical changes medially in the right maxillary sinus  with stable left maxillary sinus mucous retention cysts. The visualized paranasal sinuses, mastoid air cells and middle ears are otherwise clear. Other: None. CT CERVICAL SPINE FINDINGS Alignment: Mild kyphosis of the upper cervical spine without focal angulation or listhesis. Skull base and vertebrae: Status post C6-7 ACDF. The hardware is intact without loosening. Interbody fusion appears solid. No evidence of acute cervical spine fracture or traumatic subluxation. Soft tissues and spinal canal: No prevertebral fluid or swelling. No visible canal hematoma. Disc levels: There is moderate left foraminal narrowing at C3-4 secondary to uncinate spurring. No other significant spinal stenosis. Solid interbody fusion status post C6-7 ACDF. Mild facet hypertrophy at C7-T1. Upper chest: Unremarkable. Other: None. IMPRESSION: 1. No acute intracranial findings demonstrated. 2. Stable sequela of old right MCA infarct. 3. No evidence of acute cervical spine fracture, traumatic subluxation or static signs of instability. Solid interbody fusion post C6-7 ACDF. Moderate osseous foraminal narrowing on the left at C3-4. Electronically Signed   By: Carey Bullocks M.D.   On: 12/27/2018 17:36    Procedures Procedures (including critical care time)  Medications Ordered in ED Medications - No data to display   Initial Impression / Assessment and Plan / ED Course  I have reviewed the triage vital signs and the nursing notes.  Pertinent labs & imaging results that were available during my care of the patient were reviewed by me and considered in my medical decision making (see chart for details).  58 year old male presents with generalized weakness, dizziness, paresthesias of the left arm, and dysgeusia describing a metallic taste in his mouth since 2 PM today.  He is hypertensive but otherwise vital signs are normal. Neuro exam is abnormal but unclear if he is giving poor effort or not. ED stroke order set was utilized.  Will also obtain CT head and C-spine.  There was a significant delay in obtaining blood work unfortunately. EKG is sinus rhythm with some ST depression in lead 3. Labs are overall reassuring. UDS confirms drug abuse with cocaine and THC. There  are no other drugs of abuse noted. CT head and C-spine are stable. Discussed with patient. He doesn't feel any better. Advised MRI brain. He really feels that he was somehow "poisoned". I gave him reassurance regarding this that his labs and vitals are normal so even if he was given some kind of substance his work up here is reassuring. Due to hx of CVA I did want to obtain the MRI and encouraged him to stay. He was initially agreeable but then decided he was ready to leave. He was instructed to return if he was worsening or if symptoms are persistent. He verbalized understanding and was thankful.  Final Clinical Impressions(s) / ED Diagnoses   Final diagnoses:  Paresthesia  Dysgeusia  Dizziness  Weakness    ED Discharge Orders    None       Bethel Born, PA-C 12/28/18 0120    Melene Plan, DO 12/28/18 1514

## 2020-06-11 ENCOUNTER — Encounter: Payer: Self-pay | Admitting: Cardiology

## 2020-06-11 ENCOUNTER — Other Ambulatory Visit: Payer: Self-pay

## 2020-06-11 ENCOUNTER — Ambulatory Visit (INDEPENDENT_AMBULATORY_CARE_PROVIDER_SITE_OTHER): Payer: Medicaid Other | Admitting: Cardiology

## 2020-06-11 VITALS — BP 140/94 | HR 60 | Ht 76.5 in | Wt 157.2 lb

## 2020-06-11 DIAGNOSIS — Z7901 Long term (current) use of anticoagulants: Secondary | ICD-10-CM

## 2020-06-11 DIAGNOSIS — Z8673 Personal history of transient ischemic attack (TIA), and cerebral infarction without residual deficits: Secondary | ICD-10-CM

## 2020-06-11 DIAGNOSIS — R0789 Other chest pain: Secondary | ICD-10-CM | POA: Diagnosis not present

## 2020-06-11 DIAGNOSIS — Z9114 Patient's other noncompliance with medication regimen: Secondary | ICD-10-CM

## 2020-06-11 DIAGNOSIS — I2510 Atherosclerotic heart disease of native coronary artery without angina pectoris: Secondary | ICD-10-CM | POA: Diagnosis not present

## 2020-06-11 DIAGNOSIS — I10 Essential (primary) hypertension: Secondary | ICD-10-CM

## 2020-06-11 DIAGNOSIS — Z720 Tobacco use: Secondary | ICD-10-CM

## 2020-06-11 DIAGNOSIS — Z9861 Coronary angioplasty status: Secondary | ICD-10-CM

## 2020-06-11 DIAGNOSIS — E785 Hyperlipidemia, unspecified: Secondary | ICD-10-CM

## 2020-06-11 DIAGNOSIS — I214 Non-ST elevation (NSTEMI) myocardial infarction: Secondary | ICD-10-CM | POA: Diagnosis not present

## 2020-06-11 DIAGNOSIS — F1910 Other psychoactive substance abuse, uncomplicated: Secondary | ICD-10-CM

## 2020-06-11 MED ORDER — ATORVASTATIN CALCIUM 80 MG PO TABS: 80.0000 mg | ORAL_TABLET | Freq: Every day | ORAL | 6 refills | Status: DC

## 2020-06-11 MED ORDER — PREDNISONE 10 MG PO TABS: ORAL_TABLET | ORAL | 0 refills | Status: AC

## 2020-06-11 MED ORDER — AMLODIPINE BESYLATE 5 MG PO TABS: 5.0000 mg | ORAL_TABLET | Freq: Every day | ORAL | 6 refills | Status: DC

## 2020-06-11 MED ORDER — ASPIRIN EC 81 MG PO TBEC: 81.0000 mg | DELAYED_RELEASE_TABLET | Freq: Every day | ORAL | 3 refills | Status: DC

## 2020-06-11 MED ORDER — ISOSORBIDE DINITRATE 20 MG PO TABS: 20.0000 mg | ORAL_TABLET | Freq: Two times a day (BID) | ORAL | 6 refills | Status: DC

## 2020-06-11 NOTE — Patient Instructions (Addendum)
Medication Instructions:  Restart taking: Amlodipine 5  Mg one tablet daily Atorvastatin 80 mg one tablet daily  Isordil 20 mg one tablet twice a  Day     for your chest pain  Take  A taper pack of prednisone   *If you need a refill on your cardiac medications before your next appointment, please call your pharmacy*   Lab Work: Not needed   If you have labs (blood work) drawn today and your tests are completely normal, you will receive your results only by: Marland Kitchen MyChart Message (if you have MyChart) OR . A paper copy in the mail If you have any lab test that is abnormal or we need to change your treatment, we will call you to review the results.   Testing/Procedures: Not needed   Follow-Up: At [facility], you and your health needs are our priority.  As part of our continuing mission to provide you with exceptional heart care, we have created designated Provider Care Teams.  These Care Teams include your primary Cardiologist (physician) and Advanced Practice Providers (APPs -  Physician Assistants and Nurse Practitioners) who all work together to provide you with the care you need, when you need it.  We recommend signing up for the patient portal called "MyChart".  Sign up information is provided on this After Visit Summary.  MyChart is used to connect with patients for Virtual Visits (Telemedicine).  Patients are able to view lab/test results, encounter notes, upcoming appointments, etc.  Non-urgent messages can be sent to your provider as well.   To learn more about what you can do with MyChart, go to [email or web address].    Your next appointment:   3 month(s)  The format for your next appointment:   In Person  Provider:   Bryan Lemma, MD   Other Instructions

## 2020-06-11 NOTE — Progress Notes (Signed)
Primary Care Provider: Hoy Register, MD Cardiologist: No primary care provider on file. Electrophysiologist: None  Clinic Note: Chief Complaint  Patient presents with  . New Patient (Initial Visit)    Initial visit in 4 years.  . Coronary Artery Disease    LAD PCI,  . Chest Pain    Ongoing all day, lasting last 2 weeks.   HPI:    Timothy Peck is a 59 y.o. male with a PMH CAD-PCI (LAD) as well as prior history of PE and CVA who presents today for essentially reestablishing cardiology care -he presents with a complaint of chest pain that has been going on for last couple weeks.Launa Grill in June 2013: PCI to LAD  Repeat cath May 2014-concerning symptoms were unstable angina, was not on Plavix during incarceration -> patent stent.  Relook cath December 2014 with mild troponin elevation.  Patent stent.  Thought to be Myopericarditis.  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  Walks with a cane.  History of substance abuse, mostly marijuana, distant history of cocaine -> most recently uses marijuana for self-medication/pain control.  Problem List Items Addressed This Visit    NSTEMI (non-ST elevated myocardial infarction), 07/18/13, stable cath with patent stent. (Chronic)   CAD S/P percutaneous coronary angioplasty 02/21/12 non-STEMI -Promus DES 4.0 mm 20 mm to prox LAD - Primary (Chronic)   Dyslipidemia, goal LDL below 70 (Chronic)   Essential hypertension (Chronic)   Substance abuse (HCC) (Chronic)   Chest pain, musculoskeletal -> Costochondritis--Primary   Noncompliance with medications -> due to financial constraints     Timothy Peck was last seen on April 21, 2016.  He was noting chest pain at that time we ordered a Myoview the never was done.  Canceled due to no-show.  He last saw Dr. Odette Horns in July 2019.  Recent Hospitalizations:  09/12/2018:  ER visit for multifocal pneumonia.  Presented with chest pain and back pain.  CT angio was negative but showed multifocal pneumonia => discharge from ER, had to work with Child psychotherapist to try to make sure he can get his prescriptions filled.  12/27/2018: ER visit with generalized weakness, altered taste and paresthesias.  He felt like he may have been poisoned.  Urine tox was positive for THC and cocaine.  Reviewed  CV studies:    The following studies were reviewed today: (if available, images/films reviewed: From Epic Chart or Care Everywhere) . None since cath and echo in 2017.:   Interval History:   Timothy Peck presents here today as a long loss patient with major complaint of any chest pain all day for the last 2 to 3 weeks.  He says he wakes up and he has chest pain, is there all day long.  It is worse with deep breathing or coughing.  Does not necessarily notice that is worse with exertion, certain movements make it worse.  Not worse lying down versus sitting up.  Up until this episode, he had not have much of any symptoms.  He has been off of almost all of his meds for just about 2 years, stating that he simply cannot afford it.  He has been moving from hotel/motel to motel, living day-to-day.  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  Unfortunately, because he is unable  to get some his medications, and he has chronic pain issues, he tends to self medicate with mostly marijuana, but occasionally use cocaine if is available.  CV Review of Symptoms (Summary): positive for - chest pain, dyspnea on exertion, irregular heartbeat and Occasional heart pounding sensations.  Generalized dyspnea.  No change from 2017. negative for - edema, orthopnea, paroxysmal nocturnal dyspnea, rapid heart rate, shortness of breath or Syncope/near syncope, TIA/amaurosis fugax, claudication  The patient does have symptoms concerning for COVID-19 infection (fever,  chills, cough, or new shortness of breath).   REVIEWED OF SYSTEMS   Review of Systems  Constitutional: Positive for malaise/fatigue and weight loss.  HENT: Negative for congestion.   Respiratory: Positive for cough (Chronic smoker's cough), shortness of breath and wheezing. Negative for sputum production.   Cardiovascular: Negative for leg swelling.  Gastrointestinal: Negative for blood in stool, constipation and melena.  Genitourinary: Negative for frequency and hematuria.  Musculoskeletal: Positive for joint pain and myalgias. Negative for falls.  Neurological: Positive for dizziness, weakness (Residual left leg weakness) and headaches.  Psychiatric/Behavioral: Positive for substance abuse (See social history). Negative for depression and memory loss. The patient is nervous/anxious and has insomnia (Mostly because of stress).    I have reviewed and (if needed) personally updated the patient's problem list, medications, allergies, past medical and surgical history, social and family history.   PAST MEDICAL HISTORY   Past Medical History:  Diagnosis Date  . Asthma   . Bilateral pulmonary embolism [facility]) August 2014  . CAD S/P percutaneous coronary angioplasty 02/2012   NSTEMI - PCI to prox LAD  . Chronic anticoagulation December 2014   Switch from Xarelto to warfarin.  . Degenerative disc disease, cervical    Dx years ago  . Depression   . 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 Hypertension   . NSTEMI (non-ST elevated myocardial infarction) (HCC)    07/18/13, stable cath with patent stent; preserved EF by echo; follow-up Echo for CVA 09/2013: EF 60%, mild LVH. No RDW and a. No cardiac source for embolism noted. Bubble study was not performed  . NSTEMI (non-ST elevated myocardial infarction), 07/18/13, stable cath with patent stent. 01/31/2012  . OSA (obstructive sleep apnea)    Diagnosis long ago, used to use CPAP, not currently on any treatment.  . Polysubstance abuse (HCC)     Past history of cocaine and marijuana, both smoking. Also long standing tobacco abuse.   . Presence of drug coated stent in LAD coronary artery 02/2012   Promus Element DES 4.0 mm x 28 mm; converted from Brilinta to aspirin during stroke evaluation in February 2015    PAST SURGICAL HISTORY   Past Surgical History:  Procedure Laterality Date  . ANTERIOR CERVICAL DECOMP/DISCECTOMY FUSION  11/28/2011   Procedure: ANTERIOR CERVICAL DECOMPRESSION/DISCECTOMY FUSION 1 LEVEL;  Surgeon: Temple Pacini, MD;  Location: [facility] NEURO ORS;  Service: Neurosurgery;  Laterality: N/A;  Anterior Cervical Six-Seven Decompression and Fusion  . CARDIAC CATHETERIZATION N/A 07/08/2016   Procedure: Left Heart Cath and Coronary Angiography;  Surgeon: Marykay Lex, MD;  Location: [facility] INVASIVE CV LAB;  Service: Cardiovascular;  Laterality: N/A;  . LEFT HEART CATHETERIZATION WITH CORONARY ANGIOGRAM N/A 01/31/2012   Procedure: LEFT HEART CATHETERIZATION WITH CORONARY ANGIOGRAM;  Surgeon: Runell Gess, MD;  Location: [facility] CATH LAB;  Service: Cardiovascular: NSTEMI -  p-mLAD ~80% thrombotic lesion -> treated with [id number] Inhibitor & staged PCI  . LEFT HEART CATHETERIZATION WITH CORONARY ANGIOGRAM N/A 12/17/2012  Procedure: LEFT HEART CATHETERIZATION WITH CORONARY ANGIOGRAM;  Surgeon: Marykay Lex, MD;  Location: [facility] CATH LAB;  Service: Cardiovascular: Patent LAD DES.  CP thought to be Non-anginal  . LEFT HEART CATHETERIZATION WITH CORONARY ANGIOGRAM N/A 07/18/2013   Procedure: LEFT HEART CATHETERIZATION WITH CORONARY ANGIOGRAM;  Surgeon: Marykay Lex, MD;  Location: [facility] CATH LAB;  Service: Cardiovascular: patent stent, stable CAD.  ? Sx related to Myopericarditis.  Marland Kitchen PERCUTANEOUS CORONARY STENT INTERVENTION (PCI-S) N/A 02/02/2012   Procedure: PERCUTANEOUS CORONARY STENT INTERVENTION (PCI-S);  Surgeon: Marykay Lex, MD;  Location: [facility] CATH LAB;  Service: Cardiovascular: Staged LAD PCI - Promus DES 4x28.    . TUMOR REMOVAL       left foot third toe    Immunization History  Administered Date(s) Administered  . Influenza,inj,Quad PF,6+ Mos 09/23/2013  . Pneumococcal Polysaccharide-23 09/26/2013    MEDICATIONS/ALLERGIES   No outpatient medications have been marked as taking for the 06/11/20 encounter (Office Visit) with Marykay Lex, MD.  Not currently taking any medications.  Allergies  Allergen Reactions  . Ibuprofen Other (See Comments)    Doctor told him he could not take    SOCIAL HISTORY/FAMILY HISTORY   Reviewed in Epic:  Pertinent findings:   Currently homeless, living in a hotel.  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  OBJCTIVE -PE, EKG, labs   Wt Readings from Last 3 Encounters:  06/11/20 157 lb 3.2 oz (71.3 kg)  12/27/18 170 lb (77.1 kg)  02/10/18 156 lb 9.6 oz (71 kg)    Physical Exam: BP (!) 140/94   Pulse 60   Ht 6' 4.5" (1.943 m)   Wt 157 lb 3.2 oz (71.3 kg)   SpO2 97%   BMI 18.89 kg/m  Physical Exam Vitals reviewed.  Constitutional:      General: He is not in acute distress.    Appearance: Normal appearance. He is ill-appearing (Somewhat chronically ill-appearing.). He is not toxic-appearing.     Comments: More thin and frail than he had been.  Has lost weight since last visit.  Relative well-groomed.  HENT:     Head: Normocephalic and atraumatic.  Neck:     Vascular: No carotid bruit or JVD.  Cardiovascular:     Rate and Rhythm: Normal rate and regular rhythm. Occasional extrasystoles are present.    Chest Wall: PMI is not displaced.     Pulses: Normal pulses.     Heart sounds: No murmur heard.  No friction rub. No gallop.   Pulmonary:     Effort: Pulmonary effort is normal. No respiratory distress.     Breath sounds: Wheezing present. No rhonchi or rales.  Chest:     Chest wall: Tenderness (Exquisite point tenderness along the right greater than the left third costosternal joint.  Less tender on the second joint  bilaterally. ->  Completely reproduces his pain.) present.  Abdominal:     General: Bowel sounds are normal. There is distension.     Palpations: Abdomen is soft. There is no mass.  Musculoskeletal:        General: No swelling. Normal range of motion.     Cervical back: Normal range of motion and neck supple.  Neurological:     General: No focal deficit present.     Mental Status: He is alert and oriented to person, place, and time. Mental status is at baseline.     Gait: Gait abnormal (Does have a  limp, walks with a cane.).  Psychiatric:        Mood and Affect: Mood normal.        Behavior: Behavior normal.        Thought Content: Thought content normal.     Comments: Questionable judgment, but is truthful about using illicit substances.  He mostly admits to smoking marijuana, has not been able to afford cocaine of late.     Adult ECG Report  Rate: 60 ;  Rhythm: normal sinus rhythm, premature atrial contractions (PAC) and Biatrial enlargement.  Otherwise normal axis, intervals and durations.;   Narrative Interpretation: Stable  Recent Labs: Has not had labs drawn in over 2 years. Lab Results  Component Value Date   CHOL 112 03/10/2016   HDL 48 03/10/2016   LDLCALC 55 03/10/2016   TRIG 45 03/10/2016   CHOLHDL 2.3 03/10/2016   Lab Results  Component Value Date   CREATININE 1.26 (H) 12/27/2018   BUN 17 12/27/2018   NA 141 12/27/2018   K 4.0 12/27/2018   CL 108 12/27/2018   CO2 24 12/27/2018   Lab Results  Component Value Date   TSH 1.859 03/10/2016    ASSESSMENT/PLAN    Problem List Items Addressed This Visit    NSTEMI (non-ST elevated myocardial infarction), 07/18/13, stable cath with patent stent. (Chronic)    Distant history of non-STEMI with LAD PCI.  No regional wall motion on echo.  Stent was open in 2017.  Plan:-Continue aspirin, restart statin, Isordil and amlodipine.      Relevant Medications   isosorbide dinitrate (ISORDIL) 20 MG tablet   amLODipine  (NORVASC) 5 MG tablet   atorvastatin (LIPITOR) 80 MG tablet   aspirin EC 81 MG tablet   Other Relevant Orders   EKG 12-Lead (Completed)   CAD S/P percutaneous coronary angioplasty 02/21/12 non-STEMI -Promus DES 4.0 mm 20 mm to prox LAD - Primary (Chronic)    Thankfully, his stent was 7 years ago.  Was patent in follow-up cath and a different stress test.  I do not think his current chest pain is at all related to angina.  It is musculoskeletal in nature. He is not currently on medications.   Plan: I would like for him to try to start back on some meds including atorvastatin, amlodipine and Imdur.  Would not use beta-blocker because of cocaine use.  Would not use ACE inhibitor or ARB for fear of potential exacerbating dehydration related renal dysfunction.      Relevant Medications   isosorbide dinitrate (ISORDIL) 20 MG tablet   amLODipine (NORVASC) 5 MG tablet   atorvastatin (LIPITOR) 80 MG tablet   aspirin EC 81 MG tablet   Other Relevant Orders   EKG 12-Lead (Completed)   Chronic anticoagulation, since 03/2013 for PE (Chronic)    He will not be adherent to taking warfarin.  Would not go for DOAC.  At a minimum, I think he could probably afford aspirin.      Dyslipidemia, goal LDL below 70 (Chronic)    Last time I saw labs were in 2015.  He had been on the 80 mg Lipitor.  I think we will restart that now.  Once I am sure that he can afford to pay for labs, we can order follow-up labs.      Relevant Medications   isosorbide dinitrate (ISORDIL) 20 MG tablet   amLODipine (NORVASC) 5 MG tablet   atorvastatin (LIPITOR) 80 MG tablet   aspirin EC 81 MG tablet  Essential hypertension (Chronic)    Blood pressure is high today, partly because he is having chest pain.  He had been on a similar before.  I would use amlodipine as a potential antianginal benefit.  This will be in combination with Isordil 20 mg twice daily.      Relevant Medications   isosorbide dinitrate (ISORDIL) 20 MG  tablet   amLODipine (NORVASC) 5 MG tablet   atorvastatin (LIPITOR) 80 MG tablet   aspirin EC 81 MG tablet   Substance abuse (HCC) (Chronic)    Abuse a mixture of cocaine your marijuana, alcohol and cigarettes.  Unfortunately, this is a combination of being homeless and out of work.  Currently, collecting money as a pain handler.  I understand that he is using substances to "self medicate", but I reminded him how much these illicit drugs cost, and he barely has money to live.  Sometimes makes a choice between buying drugs over paying rent or for meal.  Unfortunately, he really needs social work assistance and potentially was better off while incarcerated for substance abuse.  He is somewhat trapped in a vicious cycle now.  He needs to get back in touch with the free health clinic and try looking to getting some type of financial assistance.      Tobacco abuse (Chronic)    Same concept this is other substances.  As long as he is using other substances, smoking will always be there.  I counseled him, but will unlikely have any benefit.      H/o ischemic right ica stroke (Chronic)    Residual weakness on the left leg.  No further episodes. I recommend that he at least stays on aspirin and statin.      Relevant Medications   isosorbide dinitrate (ISORDIL) 20 MG tablet   amLODipine (NORVASC) 5 MG tablet   atorvastatin (LIPITOR) 80 MG tablet   aspirin EC 81 MG tablet   Chest pain, musculoskeletal    Reproducible point tenderness in the costosternal border bilaterally.  Worse in the right.  This is was causing his pain.  All day long chest pain that does not change with exertion is not cardiac in nature.  Diagnosis consistent with costochondritis.  Plan: We will treat with a prednisone taper Dosepak      Relevant Orders   EKG 12-Lead (Completed)   Noncompliance with medications    This is probably the biggest issue.  He has been off all medications for 2 years.  I try to select some  the would likely be generic and affordable.  Unfortunately, I am not certain edema follow-up routinely.          COVID-19 Education: The signs and symptoms of COVID-19 were discussed with the patient and how to seek care for testing (follow up with PCP or arrange E-visit).   The importance of social distancing and COVID-19 vaccination was discussed today. 2 min The patient is practicing social distancing & Masking.   I spent a total of 45 minutes with the patient spent in direct patient consultation.  Additional time spent with chart review  / charting (studies, outside notes, etc): 20 Total Time: 65 min   Current medicines are reviewed at length with the patient today.  (+/- concerns) has been off meds x ~2 yrs.  This visit occurred during the SARS-CoV-2 public health emergency.  Safety protocols were in place, including screening questions prior to the visit, additional usage of staff PPE, and extensive cleaning of exam room  while observing appropriate contact time as indicated for disinfecting solutions.  Notice: This dictation was prepared with Dragon dictation along with smaller phrase technology. Any transcriptional errors that result from this process are unintentional and may not be corrected upon review.  Patient Instructions / Medication Changes & Studies & Tests Ordered   Patient Instructions  Medication Instructions:  Restart taking: Amlodipine 5  Mg one tablet daily Atorvastatin 80 mg one tablet daily  Isordil 20 mg one tablet twice a  Day     for your chest pain  Take  A taper pack of prednisone   *If you need a refill on your cardiac medications before your next appointment, please call your pharmacy*   Lab Work: Not needed   If you have labs (blood work) drawn today and your tests are completely normal, you will receive your results only by: Marland Kitchen MyChart Message (if you have MyChart) OR . A paper copy in the mail If you have any lab test that is abnormal or  we need to change your treatment, we will call you to review the results.   Testing/Procedures: Not needed   Follow-Up: At [facility], you and your health needs are our priority.  As part of our continuing mission to provide you with exceptional heart care, we have created designated Provider Care Teams.  These Care Teams include your primary Cardiologist (physician) and Advanced Practice Providers (APPs -  Physician Assistants and Nurse Practitioners) who all work together to provide you with the care you need, when you need it.  We recommend signing up for the patient portal called "MyChart".  Sign up information is provided on this After Visit Summary.  MyChart is used to connect with patients for Virtual Visits (Telemedicine).  Patients are able to view lab/test results, encounter notes, upcoming appointments, etc.  Non-urgent messages can be sent to your provider as well.   To learn more about what you can do with MyChart, go to [email or web address].    Your next appointment:   3 month(s)  The format for your next appointment:   In Person  Provider:   Bryan Lemma, MD   Other Instructions     Studies Ordered:   Orders Placed This Encounter  Procedures  . EKG 12-Lead     Bryan Lemma, M.D., M.S. Interventional Cardiologist   Pager # [phone] Phone # [phone] [address] [place], [place] [address]   Thank you for choosing Heartcare at [facility]!!

## 2020-06-21 ENCOUNTER — Encounter: Payer: Self-pay | Admitting: Cardiology

## 2020-06-21 NOTE — Assessment & Plan Note (Signed)
Abuse a mixture of cocaine your marijuana, alcohol and cigarettes.  Unfortunately, this is a combination of being homeless and out of work.  Currently, collecting money as a pain handler.  I understand that he is using substances to "self medicate", but I reminded him how much these illicit drugs cost, and he barely has money to live.  Sometimes makes a choice between buying drugs over paying rent or for meal.  Unfortunately, he really needs social work assistance and potentially was better off while incarcerated for substance abuse.  He is somewhat trapped in a vicious cycle now.  He needs to get back in touch with the free health clinic and try looking to getting some type of financial assistance.

## 2020-06-21 NOTE — Assessment & Plan Note (Signed)
Last time I saw labs were in 2015.  He had been on the 80 mg Lipitor.  I think we will restart that now.  Once I am sure that he can afford to pay for labs, we can order follow-up labs.

## 2020-06-21 NOTE — Assessment & Plan Note (Signed)
Same concept this is other substances.  As long as he is using other substances, smoking will always be there.  I counseled him, but will unlikely have any benefit.

## 2020-06-21 NOTE — Assessment & Plan Note (Signed)
Reproducible point tenderness in the costosternal border bilaterally.  Worse in the right.  This is was causing his pain.  All day long chest pain that does not change with exertion is not cardiac in nature.  Diagnosis consistent with costochondritis.  Plan: We will treat with a prednisone taper Dosepak

## 2020-06-21 NOTE — Assessment & Plan Note (Signed)
Thankfully, his stent was 7 years ago.  Was patent in follow-up cath and a different stress test.  I do not think his current chest pain is at all related to angina.  It is musculoskeletal in nature. He is not currently on medications.   Plan: I would like for him to try to start back on some meds including atorvastatin, amlodipine and Imdur.  Would not use beta-blocker because of cocaine use.  Would not use ACE inhibitor or ARB for fear of potential exacerbating dehydration related renal dysfunction.

## 2020-06-21 NOTE — Assessment & Plan Note (Signed)
He will not be adherent to taking warfarin.  Would not go for DOAC.  At a minimum, I think he could probably afford aspirin.

## 2020-06-22 NOTE — Assessment & Plan Note (Signed)
Blood pressure is high today, partly because he is having chest pain.  He had been on a similar before.  I would use amlodipine as a potential antianginal benefit.  This will be in combination with Isordil 20 mg twice daily.

## 2020-06-22 NOTE — Assessment & Plan Note (Signed)
Distant history of non-STEMI with LAD PCI.  No regional wall motion on echo.  Stent was open in 2017.  Plan:-Continue aspirin, restart statin, Isordil and amlodipine.

## 2020-06-22 NOTE — Assessment & Plan Note (Addendum)
Residual weakness on the left leg.  No further episodes. I recommend that he at least stays on aspirin and statin.

## 2020-06-22 NOTE — Assessment & Plan Note (Signed)
This is probably the biggest issue.  He has been off all medications for 2 years.  I try to select some the would likely be generic and affordable.  Unfortunately, I am not certain edema follow-up routinely.

## 2020-09-11 ENCOUNTER — Ambulatory Visit: Payer: Medicaid Other | Admitting: Cardiology

## 2022-12-13 ENCOUNTER — Emergency Department (HOSPITAL_COMMUNITY): Payer: Self-pay

## 2022-12-13 ENCOUNTER — Encounter (HOSPITAL_COMMUNITY): Payer: Self-pay | Admitting: Pharmacy Technician

## 2022-12-13 ENCOUNTER — Emergency Department (HOSPITAL_COMMUNITY)
Admission: EM | Admit: 2022-12-13 | Discharge: 2022-12-13 | Disposition: A | Discharge status: Home / Self Care | Payer: Self-pay | Attending: Emergency Medicine | Admitting: Emergency Medicine

## 2022-12-13 DIAGNOSIS — M5412 Radiculopathy, cervical region: Secondary | ICD-10-CM | POA: Insufficient documentation

## 2022-12-13 DIAGNOSIS — M5416 Radiculopathy, lumbar region: Secondary | ICD-10-CM | POA: Insufficient documentation

## 2022-12-13 DIAGNOSIS — Z7982 Long term (current) use of aspirin: Secondary | ICD-10-CM | POA: Insufficient documentation

## 2022-12-13 DIAGNOSIS — I2510 Atherosclerotic heart disease of native coronary artery without angina pectoris: Secondary | ICD-10-CM | POA: Insufficient documentation

## 2022-12-13 DIAGNOSIS — I10 Essential (primary) hypertension: Secondary | ICD-10-CM | POA: Insufficient documentation

## 2022-12-13 MED ORDER — CYCLOBENZAPRINE HCL 10 MG PO TABS
5.0000 mg | ORAL_TABLET | Freq: Three times a day (TID) | ORAL | Status: DC
  Administered 2022-12-13: 5 mg via ORAL
  Filled 2022-12-13: qty 1

## 2022-12-13 MED ORDER — LORAZEPAM 2 MG/ML IJ SOLN
1.0000 mg | Freq: Once | INTRAMUSCULAR | Status: AC
  Administered 2022-12-13: 1 mg via INTRAMUSCULAR
  Filled 2022-12-13: qty 1

## 2022-12-13 NOTE — ED Triage Notes (Signed)
Pt bib ems with reports of R sided skeletal pain. Pain is constant.  BP162/104 HR 80 98%  RR 18

## 2022-12-13 NOTE — Discharge Instructions (Addendum)
Your right-sided pain is caused by impingement of nerves as they leave your spinal cord. We gave you a pain medication called cyclobenzaprine, which seemed to provide some pain relief. It is important to establish care with a primary care provider who can help direct you to specialists for better treatment of your pain. If you are interested in a [facility] provider, then you can call [phone] to schedule an appointment with our Internal Medicine Clinic.

## 2022-12-13 NOTE — ED Provider Notes (Signed)
[place] EMERGENCY DEPARTMENT AT [facility] Provider Note   CSN: [id number] Arrival date & time: 12/13/22  1434     History  Hypertension Hyperlipidemia Degenerative disc disease Coronary artery disease Myocardial infection 2014 post stent Polysubstance abuse   No chief complaint on file.   Timothy Peck is a 62 y.o. male with a past medical history of hypertension, hyperlipidemia, DDD, CAD, MI post stent, and polysubstance abuse who presents with pain involving the entire right side of his body.  States that he has been experiencing severe back pain for most of his life due to degenerative disc disease. Describes pain as intense pins-needles sensation involving both upper and lower extremity. Pain shoots from his back into his extremities, also involves lateral flank. History notable for anterior cervical decompression with discectomy performed in 2013. Primary care had scheduled an MRI to evaluate lumbar spine, but patient never followed up. His pain has been gradually worsening over the last few months and is particularly bad today. Previously prescribed several medications including gabapentin and Percocet, though has fell out of contact with his doctors. Now self-medicates with cocaine, which he uses as frequently.   Earlier today, about one hour prior to arrival, the patient experienced a mechanical fall. Denies head trauma, acute worsening of his pain, and bowel or bladder incontinence. Decided to seek medical attention given the persistent and debilitating nature of his chronic pain.     Home Medications Prior to Admission medications   Medication Sig Start Date End Date Taking? Authorizing Provider  acetaminophen (TYLENOL) 325 MG tablet Take 2 tablets (650 mg total) by mouth every 6 (six) hours as needed. Patient not taking: Reported on 06/11/2020 09/13/18   Renne Crigler, PA-C  amLODipine (NORVASC) 5 MG tablet Take 1 tablet (5 mg total) by mouth daily. 06/11/20  09/09/20  Marykay Lex, MD  aspirin EC 81 MG tablet Take 1 tablet (81 mg total) by mouth daily. Swallow whole. 06/11/20   Marykay Lex, MD  atorvastatin (LIPITOR) 80 MG tablet Take 1 tablet (80 mg total) by mouth at bedtime. 06/11/20   Marykay Lex, MD  doxycycline (VIBRAMYCIN) 100 MG capsule Take 1 capsule (100 mg total) by mouth 2 (two) times daily. Patient not taking: Reported on 06/11/2020 09/13/18   Renne Crigler, PA-C  gabapentin (NEURONTIN) 300 MG capsule Take 1 capsule (300 mg total) by mouth 2 (two) times daily. Patient not taking: Reported on 09/13/2018 02/10/18   Hoy Register, MD  isosorbide dinitrate (ISORDIL) 20 MG tablet Take 1 tablet (20 mg total) by mouth 2 (two) times daily. 06/11/20   Marykay Lex, MD  methocarbamol (ROBAXIN) 500 MG tablet Take 1 tablet (500 mg total) by mouth every 8 (eight) hours as needed for muscle spasms. Patient not taking: Reported on 09/13/2018 02/10/18   Hoy Register, MD  mometasone-formoterol (DULERA) 100-5 MCG/ACT AERO Inhale 2 puffs into the lungs 2 (two) times daily. Patient not taking: Reported on 09/13/2018 02/10/18   Hoy Register, MD  rivaroxaban (XARELTO) 20 MG TABS tablet Take 1 tablet (20 mg total) by mouth daily with supper. Patient not taking: Reported on 09/13/2018 02/10/18   Hoy Register, MD  traMADol (ULTRAM) 50 MG tablet Take 1 tablet (50 mg total) by mouth every 12 (twelve) hours as needed. Patient not taking: Reported on 02/10/2018 07/30/16   Hoy Register, MD      Allergies    Ibuprofen    Review of Systems   Review of Systems  Pain  involving right side of body   Physical Exam Updated Vital Signs BP (!) 157/92   Pulse 78   Temp 97.7 F (36.5 C) (Oral) Comment: Simultaneous filing. User may not have seen previous data.  Resp 18   SpO2 98%  Physical Exam  Awake, fully oriented, periodic lapses in consciousness, lying in hospital bed Sinus rhythm, normal S1 and S2, no swelling or pitting edema Sensation  intact with allodynia across RUE and RLE Strength roughly symmetric, exam limited by patient cooperation and pain   ED Results / Procedures / Treatments   Labs (all labs ordered are listed, but only abnormal results are displayed) Labs Reviewed - No data to display  EKG None  Radiology No results found.  Procedures Procedures   None   Medications Ordered in ED Medications  cyclobenzaprine (FLEXERIL) tablet 5 mg (5 mg Oral Given 12/13/22 1708)  LORazepam (ATIVAN) injection 1 mg (1 mg Intramuscular Given 12/13/22 1803)    ED Course/ Medical Decision Making/ A&P   {   Click here for ABCD2, HEART and other calculators                          Medical Decision Making  Patient presents with chronic right-sided radicular pain secondary to longstanding degenerative disc disease. Although patient sustained a fall shortly before arrival, he denies head trauma and any new or acute worsening of baseline pain. Normal bowel and bladder function. Previously prescribed medications through pain clinic and now self-medicates with cocaine. Exam demonstrated allodynia across RUE and RLE. Strength grossly intact and symmetric. Cervical and lumbar MRI attempted, but patient unable to tolerate. Cyclobenzaprine administered and provided some symptomatic relief. Encouraged to establish care with primary care provider for pain management and specialist referrals.   Final Clinical Impression(s) / ED Diagnoses Final diagnoses:  Cervical radiculopathy  Lumbar radiculopathy    Rx / DC Orders ED Discharge Orders     None         Crissie Sickles, MD 12/13/22 1945    Gerhard Munch, MD 12/13/22 2040

## 2022-12-13 NOTE — Progress Notes (Signed)
Pt brought over to MRI and due to what he calls his nerves and what is seen as spasming, cannot hold still for test.  Pt was given IM Ativan and given over 20 min for it to kick in.  It did not help and pt still spasming and murmuring things.  Called ed RN again and she said to send him back and she'd let provider know.

## 2022-12-26 ENCOUNTER — Ambulatory Visit: Payer: Self-pay | Admitting: Student

## 2023-01-28 ENCOUNTER — Ambulatory Visit: Payer: Self-pay | Admitting: Student

## 2023-02-22 ENCOUNTER — Emergency Department (HOSPITAL_COMMUNITY): Payer: Self-pay

## 2023-02-22 ENCOUNTER — Emergency Department (HOSPITAL_COMMUNITY)
Admission: EM | Admit: 2023-02-22 | Discharge: 2023-02-22 | Disposition: A | Discharge status: Home / Self Care | Payer: Self-pay | Attending: Emergency Medicine | Admitting: Emergency Medicine

## 2023-02-22 DIAGNOSIS — Z79899 Other long term (current) drug therapy: Secondary | ICD-10-CM | POA: Insufficient documentation

## 2023-02-22 DIAGNOSIS — Z7982 Long term (current) use of aspirin: Secondary | ICD-10-CM | POA: Insufficient documentation

## 2023-02-22 DIAGNOSIS — I10 Essential (primary) hypertension: Secondary | ICD-10-CM | POA: Insufficient documentation

## 2023-02-22 DIAGNOSIS — M5441 Lumbago with sciatica, right side: Secondary | ICD-10-CM | POA: Insufficient documentation

## 2023-02-22 DIAGNOSIS — G8929 Other chronic pain: Secondary | ICD-10-CM | POA: Insufficient documentation

## 2023-02-22 LAB — CBC WITH DIFFERENTIAL/PLATELET
Abs Immature Granulocytes: 0.03 10*3/uL (ref 0.00–0.07)
Basophils Absolute: 0 10*3/uL (ref 0.0–0.1)
Basophils Relative: 0 %
Eosinophils Absolute: 0.4 10*3/uL (ref 0.0–0.5)
Eosinophils Relative: 5 %
HCT: 41 % (ref 39.0–52.0)
Hemoglobin: 13.6 g/dL (ref 13.0–17.0)
Immature Granulocytes: 0 %
Lymphocytes Relative: 25 %
Lymphs Abs: 1.9 10*3/uL (ref 0.7–4.0)
MCH: 31.2 pg (ref 26.0–34.0)
MCHC: 33.2 g/dL (ref 30.0–36.0)
MCV: 94 fL (ref 80.0–100.0)
Monocytes Absolute: 0.8 10*3/uL (ref 0.1–1.0)
Monocytes Relative: 10 %
Neutro Abs: 4.7 10*3/uL (ref 1.7–7.7)
Neutrophils Relative %: 60 %
Platelets: 261 10*3/uL (ref 150–400)
RBC: 4.36 MIL/uL (ref 4.22–5.81)
RDW: 13.2 % (ref 11.5–15.5)
WBC: 7.8 10*3/uL (ref 4.0–10.5)
nRBC: 0 % (ref 0.0–0.2)

## 2023-02-22 LAB — BASIC METABOLIC PANEL
Anion gap: 11 (ref 5–15)
BUN: 20 mg/dL (ref 6–20)
CO2: 27 mmol/L (ref 22–32)
Calcium: 9.1 mg/dL (ref 8.9–10.3)
Chloride: 103 mmol/L (ref 98–111)
Creatinine, Ser: 1.42 mg/dL — ABNORMAL HIGH (ref 0.61–1.24)
GFR, Estimated: 57 mL/min — ABNORMAL LOW (ref >60)
Glucose, Bld: 67 mg/dL — ABNORMAL LOW (ref 70–99)
Potassium: 3.7 mmol/L (ref 3.5–5.1)
Sodium: 141 mmol/L (ref 135–145)

## 2023-02-22 LAB — URINALYSIS, ROUTINE W REFLEX MICROSCOPIC
Bacteria, UA: NONE SEEN
Bilirubin Urine: NEGATIVE
Glucose, UA: NEGATIVE mg/dL
Ketones, ur: NEGATIVE mg/dL
Leukocytes,Ua: NEGATIVE
Nitrite: NEGATIVE
Protein, ur: NEGATIVE mg/dL
Specific Gravity, Urine: 1.023 (ref 1.005–1.030)
pH: 6 (ref 5.0–8.0)

## 2023-02-22 LAB — CBG MONITORING, ED: Glucose-Capillary: 120 mg/dL — ABNORMAL HIGH (ref 70–99)

## 2023-02-22 MED ORDER — OXYCODONE-ACETAMINOPHEN 5-325 MG PO TABS: 1.0000 | ORAL_TABLET | Freq: Once | ORAL | Status: DC

## 2023-02-22 MED ORDER — TIZANIDINE HCL 2 MG PO TABS
2.0000 mg | ORAL_TABLET | Freq: Two times a day (BID) | ORAL | 0 refills | Status: DC | PRN
  Filled 2023-02-22: qty 14, 7d supply, fill #0

## 2023-02-22 MED ORDER — LIDOCAINE 5 % EX PTCH
1.0000 | MEDICATED_PATCH | CUTANEOUS | 0 refills | Status: DC
  Filled 2023-02-22: qty 30, 30d supply, fill #0
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
## 2023-02-22 NOTE — ED Triage Notes (Signed)
Pt arrives via [facility] c/o chronic R lower back pain radiating down to R knee. Pt denies recent injuries. States that this pain has been ongoing for several years. No urinary symptoms, saddle anesthesia, bowel/bladder incontinence, N/V/D.

## 2023-02-22 NOTE — Progress Notes (Signed)
CSW added shelter resources to patient's AVS.  Carol Watson, MSW, LCSW Transitions of Care  Clinical Social Worker II [phone]  

## 2023-02-22 NOTE — Discharge Instructions (Signed)
At this time there does not appear to be the presence of an emergent medical condition, however there is always the potential for conditions to change. Please read and follow the below instructions.  Please return to the Emergency Department immediately for any new or worsening symptoms. Please be sure to follow up with your Primary Care Provider within one week regarding your visit today; please call their office to schedule an appointment even if you are feeling better for a follow-up visit. You may use the muscle relaxer Tizanidine as prescribed to help with your symptoms.  Do not drive or operate heavy machinery while taking Tizanidine as it will make you drowsy.  Do not drink alcohol or take other sedating medications while taking Tizanidine as this will worsen side effects. You may use the Lidoderm patch as prescribed to help with your symptoms.  Lidoderm may be expensive so you may speak with your pharmacist about finding over-the-counter medications that work similarly. Your blood pressure was slightly elevated in the ER today.  Please be sure to take your blood pressure medication as prescribed by your primary care provider.  Please have your primary care provider recheck your blood pressure this week.  If you develop any symptoms such as headache, vision changes, dizziness, chest pain, shortness of breath, numbness, weakness or any additional concerns please call 911 and go to the emergency room immediately.   Please read the additional information packets attached to your discharge summary.  Go to the nearest Emergency Department immediately if: You have fever or chills You develop severe pain. Your pain suddenly gets worse. You develop increasing weakness in your legs. You lose the ability to control your bladder or bowel. You have difficulty walking or balancing. You have any new/concerning or worsening of symptoms.  Do not take your medicine if  develop an itchy rash, swelling in  your mouth or lips, or difficulty breathing; call 911 and seek immediate emergency medical attention if this occurs.  You may review your lab tests and imaging results in their entirety on your MyChart account.  Please discuss all results of fully with your primary care provider and other specialist at your follow-up visit.  Note: Portions of this text may have been transcribed using voice recognition software. Every effort was made to ensure accuracy; however, inadvertent computerized transcription errors may still be present.

## 2023-02-23 ENCOUNTER — Other Ambulatory Visit: Payer: Self-pay

## 2023-02-27 ENCOUNTER — Other Ambulatory Visit: Payer: Self-pay

## 2023-03-02 ENCOUNTER — Other Ambulatory Visit: Payer: Self-pay

## 2023-10-30 ENCOUNTER — Inpatient Hospital Stay (HOSPITAL_COMMUNITY)
Admission: EM | Admit: 2023-10-30 | Discharge: 2023-10-31 | DRG: 281 | Disposition: A | Discharge status: Home / Self Care | Attending: Internal Medicine | Admitting: Internal Medicine

## 2023-10-30 ENCOUNTER — Inpatient Hospital Stay (HOSPITAL_COMMUNITY)

## 2023-10-30 ENCOUNTER — Encounter (HOSPITAL_COMMUNITY): Payer: Self-pay | Admitting: Physician Assistant

## 2023-10-30 ENCOUNTER — Other Ambulatory Visit: Payer: Self-pay

## 2023-10-30 ENCOUNTER — Emergency Department (HOSPITAL_COMMUNITY)

## 2023-10-30 DIAGNOSIS — F200 Paranoid schizophrenia: Secondary | ICD-10-CM | POA: Diagnosis present

## 2023-10-30 DIAGNOSIS — Z7901 Long term (current) use of anticoagulants: Secondary | ICD-10-CM | POA: Diagnosis not present

## 2023-10-30 DIAGNOSIS — Z7951 Long term (current) use of inhaled steroids: Secondary | ICD-10-CM | POA: Diagnosis not present

## 2023-10-30 DIAGNOSIS — I214 Non-ST elevation (NSTEMI) myocardial infarction: Secondary | ICD-10-CM | POA: Diagnosis present

## 2023-10-30 DIAGNOSIS — E876 Hypokalemia: Secondary | ICD-10-CM | POA: Diagnosis present

## 2023-10-30 DIAGNOSIS — R9431 Abnormal electrocardiogram [ECG] [EKG]: Secondary | ICD-10-CM | POA: Diagnosis present

## 2023-10-30 DIAGNOSIS — Z66 Do not resuscitate: Secondary | ICD-10-CM | POA: Diagnosis present

## 2023-10-30 DIAGNOSIS — F17210 Nicotine dependence, cigarettes, uncomplicated: Secondary | ICD-10-CM | POA: Diagnosis present

## 2023-10-30 DIAGNOSIS — I2510 Atherosclerotic heart disease of native coronary artery without angina pectoris: Secondary | ICD-10-CM | POA: Diagnosis present

## 2023-10-30 DIAGNOSIS — N179 Acute kidney failure, unspecified: Secondary | ICD-10-CM | POA: Diagnosis present

## 2023-10-30 DIAGNOSIS — Z91199 Patient's noncompliance with other medical treatment and regimen due to unspecified reason: Secondary | ICD-10-CM

## 2023-10-30 DIAGNOSIS — Y900 Blood alcohol level of less than 20 mg/100 ml: Secondary | ICD-10-CM | POA: Diagnosis present

## 2023-10-30 DIAGNOSIS — Z8249 Family history of ischemic heart disease and other diseases of the circulatory system: Secondary | ICD-10-CM | POA: Diagnosis not present

## 2023-10-30 DIAGNOSIS — J45909 Unspecified asthma, uncomplicated: Secondary | ICD-10-CM | POA: Diagnosis present

## 2023-10-30 DIAGNOSIS — N1831 Chronic kidney disease, stage 3a: Secondary | ICD-10-CM | POA: Diagnosis present

## 2023-10-30 DIAGNOSIS — G8929 Other chronic pain: Secondary | ICD-10-CM | POA: Diagnosis present

## 2023-10-30 DIAGNOSIS — E860 Dehydration: Secondary | ICD-10-CM | POA: Diagnosis present

## 2023-10-30 DIAGNOSIS — Z1152 Encounter for screening for COVID-19: Secondary | ICD-10-CM

## 2023-10-30 DIAGNOSIS — F1410 Cocaine abuse, uncomplicated: Secondary | ICD-10-CM | POA: Diagnosis present

## 2023-10-30 DIAGNOSIS — G4733 Obstructive sleep apnea (adult) (pediatric): Secondary | ICD-10-CM | POA: Diagnosis present

## 2023-10-30 DIAGNOSIS — I1310 Hypertensive heart and chronic kidney disease without heart failure, with stage 1 through stage 4 chronic kidney disease, or unspecified chronic kidney disease: Secondary | ICD-10-CM | POA: Diagnosis present

## 2023-10-30 DIAGNOSIS — Z5900 Homelessness unspecified: Secondary | ICD-10-CM | POA: Diagnosis not present

## 2023-10-30 DIAGNOSIS — R079 Chest pain, unspecified: Secondary | ICD-10-CM | POA: Diagnosis present

## 2023-10-30 DIAGNOSIS — Z91148 Patient's other noncompliance with medication regimen for other reason: Secondary | ICD-10-CM

## 2023-10-30 DIAGNOSIS — I959 Hypotension, unspecified: Secondary | ICD-10-CM | POA: Diagnosis present

## 2023-10-30 DIAGNOSIS — Z809 Family history of malignant neoplasm, unspecified: Secondary | ICD-10-CM

## 2023-10-30 DIAGNOSIS — Z823 Family history of stroke: Secondary | ICD-10-CM

## 2023-10-30 DIAGNOSIS — Z833 Family history of diabetes mellitus: Secondary | ICD-10-CM

## 2023-10-30 DIAGNOSIS — Z820 Family history of epilepsy and other diseases of the nervous system: Secondary | ICD-10-CM

## 2023-10-30 DIAGNOSIS — Z5982 Transportation insecurity: Secondary | ICD-10-CM

## 2023-10-30 DIAGNOSIS — Z955 Presence of coronary angioplasty implant and graft: Secondary | ICD-10-CM

## 2023-10-30 DIAGNOSIS — F32A Depression, unspecified: Secondary | ICD-10-CM | POA: Diagnosis present

## 2023-10-30 DIAGNOSIS — R531 Weakness: Secondary | ICD-10-CM | POA: Diagnosis present

## 2023-10-30 DIAGNOSIS — Z886 Allergy status to analgesic agent status: Secondary | ICD-10-CM

## 2023-10-30 DIAGNOSIS — F1210 Cannabis abuse, uncomplicated: Secondary | ICD-10-CM | POA: Diagnosis present

## 2023-10-30 DIAGNOSIS — Z86711 Personal history of pulmonary embolism: Secondary | ICD-10-CM

## 2023-10-30 DIAGNOSIS — Z79899 Other long term (current) drug therapy: Secondary | ICD-10-CM

## 2023-10-30 DIAGNOSIS — I10 Essential (primary) hypertension: Secondary | ICD-10-CM | POA: Diagnosis present

## 2023-10-30 DIAGNOSIS — Z8673 Personal history of transient ischemic attack (TIA), and cerebral infarction without residual deficits: Secondary | ICD-10-CM

## 2023-10-30 DIAGNOSIS — F1910 Other psychoactive substance abuse, uncomplicated: Secondary | ICD-10-CM | POA: Diagnosis present

## 2023-10-30 DIAGNOSIS — Z7982 Long term (current) use of aspirin: Secondary | ICD-10-CM

## 2023-10-30 DIAGNOSIS — I252 Old myocardial infarction: Secondary | ICD-10-CM

## 2023-10-30 HISTORY — DX: Chronic kidney disease, stage 3a: N18.31

## 2023-10-30 LAB — RESPIRATORY PANEL BY PCR

## 2023-10-30 LAB — HEPATIC FUNCTION PANEL
ALT: 17 U/L (ref 0–44)
AST: 23 U/L (ref 15–41)
Albumin: 4 g/dL (ref 3.5–5.0)
Alkaline Phosphatase: 61 U/L (ref 38–126)
Bilirubin, Direct: 0.2 mg/dL (ref 0.0–0.2)
Indirect Bilirubin: 0.7 mg/dL (ref 0.3–0.9)
Total Bilirubin: 0.9 mg/dL (ref 0.0–1.2)
Total Protein: 7.2 g/dL (ref 6.5–8.1)

## 2023-10-30 LAB — HEPARIN LEVEL (UNFRACTIONATED)
Heparin Unfractionated: <0.1 [IU]/mL — ABNORMAL LOW (ref 0.30–0.70)
Heparin Unfractionated: 0.32 [IU]/mL (ref 0.30–0.70)

## 2023-10-30 LAB — TSH: TSH: 0.576 u[IU]/mL (ref 0.350–4.500)

## 2023-10-30 LAB — BASIC METABOLIC PANEL WITH GFR
Anion gap: 11 (ref 5–15)
BUN: 14 mg/dL (ref 6–20)
CO2: 23 mmol/L (ref 22–32)
Calcium: 8.3 mg/dL — ABNORMAL LOW (ref 8.9–10.3)
Chloride: 107 mmol/L (ref 98–111)
Creatinine, Ser: 1.84 mg/dL — ABNORMAL HIGH (ref 0.61–1.24)
GFR, Estimated: 41 mL/min — ABNORMAL LOW (ref >60)
Glucose, Bld: 134 mg/dL — ABNORMAL HIGH (ref 70–99)
Potassium: 3.3 mmol/L — ABNORMAL LOW (ref 3.5–5.1)
Sodium: 141 mmol/L (ref 135–145)

## 2023-10-30 LAB — RAPID URINE DRUG SCREEN, HOSP PERFORMED
Amphetamines: NOT DETECTED
Barbiturates: NOT DETECTED
Benzodiazepines: NOT DETECTED
Cocaine: POSITIVE — AB
Opiates: NOT DETECTED
Tetrahydrocannabinol: POSITIVE — AB

## 2023-10-30 LAB — RESP PANEL BY RT-PCR (RSV, FLU A&B, COVID)  RVPGX2
Influenza A by PCR: NEGATIVE
Influenza B by PCR: NEGATIVE
Resp Syncytial Virus by PCR: NEGATIVE
SARS Coronavirus 2 by RT PCR: NEGATIVE

## 2023-10-30 LAB — TROPONIN I (HIGH SENSITIVITY)
Troponin I (High Sensitivity): 34 ng/L — ABNORMAL HIGH (ref <18)
Troponin I (High Sensitivity): 32 ng/L — ABNORMAL HIGH (ref <18)

## 2023-10-30 LAB — ETHANOL: Alcohol, Ethyl (B): <10 mg/dL (ref <10)

## 2023-10-30 LAB — MAGNESIUM: Magnesium: 2.2 mg/dL (ref 1.7–2.4)

## 2023-10-30 LAB — CBC
HCT: 43.5 % (ref 39.0–52.0)
Hemoglobin: 14.3 g/dL (ref 13.0–17.0)
MCH: 31.1 pg (ref 26.0–34.0)
MCHC: 32.9 g/dL (ref 30.0–36.0)
MCV: 94.6 fL (ref 80.0–100.0)
Platelets: 226 10*3/uL (ref 150–400)
RBC: 4.6 MIL/uL (ref 4.22–5.81)
RDW: 13.3 % (ref 11.5–15.5)
WBC: 8.7 10*3/uL (ref 4.0–10.5)
nRBC: 0 % (ref 0.0–0.2)

## 2023-10-30 LAB — CK: Total CK: 177 U/L (ref 49–397)

## 2023-10-30 MED ORDER — LACTATED RINGERS IV SOLN: INTRAVENOUS | Status: AC

## 2023-10-30 MED ORDER — ACETAMINOPHEN 325 MG PO TABS
650.0000 mg | ORAL_TABLET | Freq: Four times a day (QID) | ORAL | Status: DC
  Administered 2023-10-30 – 2023-10-31 (×2): 650 mg via ORAL
  Filled 2023-10-30 (×4): qty 2

## 2023-10-30 MED ORDER — HEPARIN (PORCINE) 25000 UT/250ML-% IV SOLN
1100.0000 [IU]/h | INTRAVENOUS | Status: DC
  Administered 2023-10-30: 1000 [IU]/h via INTRAVENOUS
  Filled 2023-10-30: qty 250

## 2023-10-30 MED ORDER — POLYETHYLENE GLYCOL 3350 17 G PO PACK: 17.0000 g | PACK | Freq: Every day | ORAL | Status: DC

## 2023-10-30 MED ORDER — ASPIRIN 81 MG PO TBEC
81.0000 mg | DELAYED_RELEASE_TABLET | Freq: Every day | ORAL | Status: DC
  Administered 2023-10-31: 81 mg via ORAL
  Filled 2023-10-30 (×2): qty 1

## 2023-10-30 MED ORDER — LIDOCAINE 5 % EX PTCH
1.0000 | MEDICATED_PATCH | CUTANEOUS | Status: DC
  Administered 2023-10-30: 1 via TRANSDERMAL
  Filled 2023-10-30: qty 1

## 2023-10-30 MED ORDER — POTASSIUM CHLORIDE CRYS ER 20 MEQ PO TBCR
40.0000 meq | EXTENDED_RELEASE_TABLET | Freq: Once | ORAL | Status: AC
  Administered 2023-10-30: 40 meq via ORAL
  Filled 2023-10-30: qty 2

## 2023-10-30 MED ORDER — DICLOFENAC SODIUM 1 % EX GEL
4.0000 g | Freq: Four times a day (QID) | CUTANEOUS | Status: DC
  Filled 2023-10-30: qty 100

## 2023-10-30 MED ORDER — HEPARIN BOLUS VIA INFUSION
4000.0000 [IU] | Freq: Once | INTRAVENOUS | Status: AC
  Administered 2023-10-30: 4000 [IU] via INTRAVENOUS
  Filled 2023-10-30: qty 4000

## 2023-10-30 NOTE — H&P (Signed)
 . Date: 10/30/2023               Patient Name:  Timothy Peck MRN: [id number]  DOB: Jul 30, 1961 Age / Sex: 63 y.o., male   PCP: Patient, No Pcp Per         Medical Service: Internal Medicine Teaching Service         Attending Physician: Dr. Reymundo Poll, MD      First Contact: Dr. Jeral Pinch, DO Pager [phone]    Second Contact: Dr. 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  Patient reports ~08:30 AM, he went across the street to his friend house and as he was waiting by the door, he felt sudden chest pain, pressure like, constant with bilateral upper extremity weakness and numbness, diaphoresis, dizziness and lightheadedness. Reports that the episode lasted until he came to ED, presently reports the pain has improved, but still present. He reports smoking cocaine, states he smokes about $100 dollar worth, smoked cocaine yesterday. Reports he smokes cocaine secondary to the joint pain because he has history of degenerative joint disease. He does endorses taking goody powders when he has headaches.   He endorses symptoms of cough, congestion and sore throat, unable to specify time and duration. Denies any fevers. Denies any abdominal pain, nausea, or vomiting,   He ambulates using a cane and a walker.   ED Course: EDP consulted cardiology    Past Medical History: Past Medical History:  Diagnosis Date   Asthma    Bilateral pulmonary embolism (HCC) 03/2013   CAD S/P percutaneous coronary angioplasty 02/2012   NSTEMI - PCI to prox LAD   CKD stage 3a, GFR 45-59 ml/min (HCC)    Degenerative disc disease, cervical    Dx years ago   Depression    H/o ischemic right ica stroke 09/2013   Hypertension    OSA (obstructive sleep apnea)    Diagnosis long ago, used to use CPAP, not currently on any treatment.   Polysubstance abuse (HCC)    Past history of cocaine and marijuana, both smoking. Also long standing tobacco abuse.    Presence of drug coated stent in LAD coronary artery 02/2012   Promus Element DES 4.0 mm x 28 mm; converted from Brilinta to aspirin during stroke evaluation in February 2015    Per chart review, medications noted by cardiologist in 06/2020 Amlodipine 5 mg daily Lipitor 80 mg daily Aspirin 81 mg daily Isosorbide nitrite 20 mg daily  - However he is not taking any medications   Past Surgical History:  Procedure Laterality Date   ANTERIOR CERVICAL DECOMP/DISCECTOMY FUSION  11/28/2011   Procedure: ANTERIOR CERVICAL DECOMPRESSION/DISCECTOMY FUSION 1 LEVEL;  Surgeon: Temple Pacini, MD;  Location: [facility];  Service: Neurosurgery;  Laterality: N/A;  Anterior Cervical Six-Seven Decompression and Fusion  CARDIAC CATHETERIZATION N/A 07/08/2016   Procedure: Left Heart Cath and Coronary Angiography;  Surgeon: Marykay Lex, MD;  Location: [facility] INVASIVE CV LAB;  Service: Cardiovascular;  Laterality: N/A;   LEFT HEART CATHETERIZATION WITH CORONARY ANGIOGRAM N/A 01/31/2012   Procedure: LEFT HEART CATHETERIZATION WITH CORONARY ANGIOGRAM;  Surgeon: Runell Gess, MD;  Location: [facility] CATH LAB;  Service: Cardiovascular: NSTEMI -  p-mLAD ~80% thrombotic lesion -> treated with [id number] Inhibitor & staged PCI   LEFT HEART CATHETERIZATION WITH CORONARY ANGIOGRAM N/A 12/17/2012   Procedure: LEFT HEART CATHETERIZATION WITH  CORONARY ANGIOGRAM;  Surgeon: Marykay Lex, MD;  Location: [facility] CATH LAB;  Service: Cardiovascular: Patent LAD DES.  CP thought to be Non-anginal   LEFT HEART CATHETERIZATION WITH CORONARY ANGIOGRAM N/A 07/18/2013   Procedure: LEFT HEART CATHETERIZATION WITH CORONARY ANGIOGRAM;  Surgeon: Marykay Lex, MD;  Location: [facility] CATH LAB;  Service: Cardiovascular: patent stent, stable CAD.  ? Sx related to Myopericarditis.   PERCUTANEOUS CORONARY STENT INTERVENTION (PCI-S) N/A 02/02/2012   Procedure: PERCUTANEOUS CORONARY STENT INTERVENTION (PCI-S);  Surgeon: Marykay Lex, MD;  Location: [facility] CATH LAB;  Service: Cardiovascular: Staged LAD PCI - Promus DES 4x28.     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   OBJECTIVE:   Physical Exam: Blood pressure 105/77, pulse 70, temperature (!) 97.4 F (36.3 C), temperature source Oral, resp. rate 16, height 6\' 4"  (1.93 m), weight 77.1 kg, SpO2 100%.   Constitutional: Appears ill, laying in bed, no acute distres Neuro: Somnolent, easily falling asleep though answering questions. 3-4/5 grip strength b/l. Unable to move b/l UE due to pain.  HENT: normocephalic atraumatic, MM dry Eyes: PERRL Cardiovascular: regular rate and rhythm Chest: pain is reproduced along the chest wall  Pulmonary/Chest: normal work of breathing on room  air, lungs clear to auscultation bilaterally Abdominal: soft, non-tender, non-distended, bowel sounds present  MSK: No LE edema bilaterally, TTP along the deltoid muscles and triceps b/l  Skin: warm and dry  Labs: CBC    Component Value Date/Time   WBC 8.7 10/30/2023 1018   RBC 4.60 10/30/2023 1018   HGB 14.3 10/30/2023 1018   HGB 13.3 07/23/2012 2120   HCT 43.5 10/30/2023 1018   HCT 39.9 (L) 07/23/2012 2120   PLT 226 10/30/2023 1018   PLT 205 07/23/2012 2120   MCV 94.6 10/30/2023 1018   MCV 94 07/23/2012 2120   MCH 31.1 10/30/2023 1018   MCHC 32.9 10/30/2023 1018   RDW 13.3 10/30/2023 1018   RDW 13.0 07/23/2012 2120   LYMPHSABS 1.9 02/22/2023 0902   LYMPHSABS 2.0 07/23/2012 2120   MONOABS 0.8 02/22/2023 0902   MONOABS 0.4 07/23/2012 2120   EOSABS 0.4 02/22/2023 0902   EOSABS 0.3 07/23/2012 2120   BASOSABS 0.0 02/22/2023 0902  BASOSABS 0.0 07/23/2012 2120     CMP     Component Value Date/Time   NA 141 10/30/2023 1018   NA 141 02/10/2018 1532   NA 141 07/23/2012 2120   K 3.3 (L) 10/30/2023 1018   K 4.3 07/23/2012 2120   CL 107 10/30/2023 1018   CL 107 07/23/2012 2120   CO2 23 10/30/2023 1018   CO2 30 07/23/2012 2120   GLUCOSE 134 (H) 10/30/2023 1018   GLUCOSE 73 07/23/2012 2120   BUN 14 10/30/2023 1018   BUN 20 02/10/2018 1532   BUN 20 (H) 07/23/2012 2120   CREATININE 1.84 (H) 10/30/2023 1018   CREATININE 1.25 09/29/2014 1620   CALCIUM 8.3 (L) 10/30/2023 1018   CALCIUM 8.3 (L) 07/23/2012 2120   PROT 6.7 12/27/2018 1906   PROT 6.8 02/10/2018 1532   PROT 6.5 07/23/2012 2120   ALBUMIN 4.0 12/27/2018 1906   ALBUMIN 4.4 02/10/2018 1532   ALBUMIN 3.8 07/23/2012 2120   AST 25 12/27/2018 1906   AST 24 07/23/2012 2120   ALT 23 12/27/2018 1906   ALT 33 07/23/2012 2120   ALKPHOS 60 12/27/2018 1906   ALKPHOS 67 07/23/2012 2120   BILITOT 0.5 12/27/2018 1906   BILITOT 0.5 02/10/2018 1532   BILITOT 0.3 07/23/2012 2120   GFRNONAA 41 (L) 10/30/2023 1018   GFRNONAA  66 09/29/2014 1620   GFRAA >60 12/27/2018 1906   GFRAA 77 09/29/2014 1620    Imaging: No results found.  EKG: personally reviewed my interpretation is T wave inversions noted  V4-V6, and noted in leads I and II that is new compared to previous EKG on 12/2018. QTC 499, normal PR 144 ms.   ASSESSMENT & PLAN:   Assessment & Plan by Problem: Principal Problem:   NSTEMI (non-ST elevated myocardial infarction), 07/18/13, stable cath with patent stent. Active Problems:   Substance abuse (HCC)   Essential hypertension   Acute kidney injury (HCC)   Timothy Peck is a 63 y.o. 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   NSTEMI  Hx of CAD w/ PCI 2013 Cocaine Use Presented with symptoms of substernal chest pain, diaphoresis, B/l UE weakness and numbness. Labs notable for Troponin 34 -> 32. EKG with new findings of T wave inversions in the lateral leads. Cardiology consulted per EDP. Patient is started on IV heparin. Etiology more likely secondary to cocaine use resulting in cardiac vessels vasospasm with already hx of CAD.  - Follow cardiology recommendations - Continue IV heparin  - ECHO  - Follow up Lipid panel, TSH, and CK - Tylenol 650 mg q6h prn - Voltaren gel for pain - Lidocaine patches  - Avoid BB in the setting of cocaine use  AKI on CKD 3a  Presented with Cr 1.84 (baseline 1.2-1.4) likely in the setting of poor PO intake, hypotension and dehydration.  - LR 100 mL/hr for 10 hours  - Trend Scr - Avoid any nephrotoxic agents   Cough Reported symptoms of cough, congestion and sore throat. No fevers. Lung exam  clear wo wheezing or crackles.  - CXR pending - Follow up respiratory panel   Hypotension Presented with  concerns of low blood pressure with 96/50 and diaphoresis. BP stable presently.  - LR 100 mL/hr for 10 hours - Avoid antihypertensive medication   History of Polysubstance abuse  Medication noncompliance SDOH Homeless with history of medication noncompliance. He reports smoking cocaine about a $100 worth today and yesterday, states that he smokes it for joint pain. Denies any alcohol use, but appears somnolent.  - Follow up UDS - Follow up EtOH - CIWA wo Ativan - TOC consulted   History of CVA 2015 Prior Hx of PE 2014 He was previously on warfarin, however did not take it. Noncompliant with DOAC. Presently not on any anticoagulation. He reports that they were expensive.  - IV heparin    Diet:  NPO  VTE: Heparin IVF: LR,100cc/hr Code: DNR/DNI  Prior to Admission Living Arrangement: Homeless Anticipated Discharge Location:  unknown  Barriers to Discharge: medical management  Dispo: Admit patient to Inpatient with expected length of stay greater than 2 midnights.  Signed: Jeral Pinch, DO Internal Medicine Resident PGY-1  10/30/2023, 1:53 PM

## 2023-10-30 NOTE — ED Provider Notes (Addendum)
 [place] EMERGENCY DEPARTMENT AT [facility] Provider Note   CSN: [id number] Arrival date & time: 10/30/23  7425     History  Chief Complaint  Patient presents with   Chest Pain   Hypotension    Timothy Peck is a 63 y.o. male.  Patient is a 63 year old male with a past medical history of CAD, CVA with left-sided deficits, hypertension, substance use, medication noncompliance presenting to the emergency department with chest pain.  Patient states that he woke up this morning feeling well and went to the store across the street.  He states that he bought some THC cigarettes and went back to his friend's house to smoke limb.  Shortly after started to develop some midsternal chest pressure that radiates into his right arm.  He states that he felt severely short of breath and got sweaty.  He states he felt lightheaded like he might pass out and 911 was called.  Per EMS the patient was hypotensive to the 80s on their arrival.  They gave him 250 mL of fluids and aspirin with improvement of blood pressure. He states he last used cocaine yesterday.  The history is provided by the patient and the EMS personnel.  Chest Pain      Home Medications Prior to Admission medications   Medication Sig Start Date End Date Taking? Authorizing Provider  acetaminophen (TYLENOL) 325 MG tablet Take 2 tablets (650 mg total) by mouth every 6 (six) hours as needed. Patient not taking: Reported on 06/11/2020 09/13/18   Renne Crigler, PA-C  amLODipine (NORVASC) 5 MG tablet Take 1 tablet (5 mg total) by mouth daily. 06/11/20 09/09/20  Marykay Lex, MD  aspirin EC 81 MG tablet Take 1 tablet (81 mg total) by mouth daily. Swallow whole. 06/11/20   Marykay Lex, MD  atorvastatin (LIPITOR) 80 MG tablet Take 1 tablet (80 mg total) by mouth at bedtime. 06/11/20   Marykay Lex, MD  doxycycline (VIBRAMYCIN) 100 MG capsule Take 1 capsule (100 mg total) by mouth 2 (two) times daily. Patient not taking:  Reported on 06/11/2020 09/13/18   Renne Crigler, PA-C  gabapentin (NEURONTIN) 300 MG capsule Take 1 capsule (300 mg total) by mouth 2 (two) times daily. Patient not taking: Reported on 09/13/2018 02/10/18   Hoy Register, MD  isosorbide dinitrate (ISORDIL) 20 MG tablet Take 1 tablet (20 mg total) by mouth 2 (two) times daily. 06/11/20   Marykay Lex, MD  lidocaine (LIDODERM) 5 % Place 1 patch onto the skin daily. Remove & Discard patch within 12 hours or as directed by MD 02/22/23   Bill Salinas, PA-C  mometasone-formoterol (DULERA) 100-5 MCG/ACT AERO Inhale 2 puffs into the lungs 2 (two) times daily. Patient not taking: Reported on 09/13/2018 02/10/18   Hoy Register, MD  rivaroxaban (XARELTO) 20 MG TABS tablet Take 1 tablet (20 mg total) by mouth daily with supper. Patient not taking: Reported on 09/13/2018 02/10/18   Hoy Register, MD  tiZANidine (ZANAFLEX) 2 MG tablet Take 1 tablet (2 mg total) by mouth every 12 (twelve) hours as needed for muscle spasms. 02/22/23   Harlene Salts A, PA-C  traMADol (ULTRAM) 50 MG tablet Take 1 tablet (50 mg total) by mouth every 12 (twelve) hours as needed. Patient not taking: Reported on 02/10/2018 07/30/16   Hoy Register, MD      Allergies    Ibuprofen    Review of Systems   Review of Systems  Cardiovascular:  Positive for  chest pain.    Physical Exam Updated Vital Signs BP 105/77 (BP Location: Right Arm)   Pulse 70   Temp (!) 97.4 F (36.3 C) (Oral)   Resp 16   Ht 6\' 4"  (1.93 m)   Wt 77.1 kg   SpO2 100%   BMI 20.69 kg/m  Physical Exam Vitals and nursing note reviewed.  Constitutional:      General: He is not in acute distress.    Appearance: He is well-developed.  HENT:     Head: Normocephalic and atraumatic.  Eyes:     Extraocular Movements: Extraocular movements intact.  Cardiovascular:     Rate and Rhythm: Normal rate and regular rhythm.     Pulses:          Radial pulses are 2+ on the right side and 2+ on the left  side.     Heart sounds: Normal heart sounds.  Pulmonary:     Effort: Pulmonary effort is normal.     Breath sounds: Normal breath sounds.  Chest:     Chest wall: Tenderness (R lateral chest wall) present.  Abdominal:     Palpations: Abdomen is soft.     Tenderness: There is no abdominal tenderness.  Musculoskeletal:        General: Normal range of motion.     Cervical back: Normal range of motion and neck supple.     Right lower leg: No edema.     Left lower leg: No edema.  Skin:    General: Skin is warm and dry.  Neurological:     Mental Status: He is alert and oriented to person, place, and time.     Comments: L-sided facial droop and weakness, at baseline per patient  Psychiatric:        Mood and Affect: Mood normal.        Behavior: Behavior normal.     ED Results / Procedures / Treatments   Labs (all labs ordered are listed, but only abnormal results are displayed) Labs Reviewed  BASIC METABOLIC PANEL WITH GFR - Abnormal; Notable for the following components:      Result Value   Potassium 3.3 (*)    Glucose, Bld 134 (*)    Creatinine, Ser 1.84 (*)    Calcium 8.3 (*)    GFR, Estimated 41 (*)    All other components within normal limits  TROPONIN I (HIGH SENSITIVITY) - Abnormal; Notable for the following components:   Troponin I (High Sensitivity) 34 (*)    All other components within normal limits  CBC  RAPID URINE DRUG SCREEN, HOSP PERFORMED  TROPONIN I (HIGH SENSITIVITY)    EKG EKG Interpretation Date/Time:  Friday October 30 2023 10:07:06 EDT Ventricular Rate:  68 PR Interval:  144 QRS Duration:  89 QT Interval:  469 QTC Calculation: 499 R Axis:   67  Text Interpretation: Sinus rhythm Biatrial enlargement Probable LVH with secondary repol abnrm Anterior ST elevation, probably due to LVH Borderline prolonged QT interval Septal t-wave inversions V4-V6 new compared to prior EKG in 2020 Confirmed by Elayne Snare ([id number]) on 10/30/2023 10:09:50  AM  Radiology No results found.  Procedures .Critical Care  Performed by: Rexford Maus, DO Authorized by: Rexford Maus, DO   Critical care provider statement:    Critical care time (minutes):  30   Critical care was necessary to treat or prevent imminent or life-threatening deterioration of the following conditions:  Cardiac failure   Critical care was time spent  personally by me on the following activities:  Development of treatment plan with patient or surrogate, discussions with consultants, evaluation of patient's response to treatment, examination of patient, ordering and review of laboratory studies, ordering and review of radiographic studies, ordering and performing treatments and interventions, pulse oximetry, re-evaluation of patient's condition and review of old charts     Medications Ordered in ED Medications - No data to display  ED Course/ Medical Decision Making/ A&P Clinical Course as of 10/30/23 1200  Fri Oct 30, 2023  1119 Troponin is 34, will need repeat. Mild AKI on CKD. With new EKG changes will discuss with cardiology for concern of NSTEMI. [VK]  1153 I spoke with cardiology PA who will discuss with attending/interventionalist. Concern for possible NSTEMI/STEMI and agree with initiating heparin. [VK]  1158 Received call back from cardiology after discussing with attending - no need to activate cath lab. Recommend treating for NSTEMI and admitting to medicine and they will follow in consult. Recommend UDS. [VK]    Clinical Course User Index [VK] Rexford Maus, DO                                 Medical Decision Making This patient presents to the ED with chief complaint(s) of chest pain with pertinent past medical history of CAD, CVA, polysubstance use, medication non-compliance which further complicates the presenting complaint. The complaint involves an extensive differential diagnosis and also carries with it a high risk of complications  and morbidity.    The differential diagnosis includes ACS, arrhythmia, anemia, pneumonia, pneumothorax, pulmonary edema, pleural effusion, symptoms induced chest pain, dehydration, electrolyte abnormality, muscular skeletal pain  Additional history obtained: Additional history obtained from EMS  Records reviewed outpatient cardiology records  ED Course and Reassessment: On patient's arrival he is hemodynamically stable in no acute distress, reports chest pain started around 830 this morning and is still having chest pain though improved from when it started.  EKG on arrival showed normal sinus rhythm with new T wave inversions in V4 through V6 compared to prior EKG.  Patient will have labs including troponin and chest x-ray performed.  He declined any pain control at this time and will be closely reassessed.  Independent labs interpretation:  The following labs were independently interpreted: elevated troponin, AKI on CKD  Independent visualization of imaging: - I independently visualized the following imaging with scope of interpretation limited to determining acute life threatening conditions related to emergency care: CXR, which revealed no acute disease  Consultation: - Consulted or discussed management/test interpretation w/ external professional: cardiology, hospitalist  Consideration for admission or further workup: patient requires admission for NSTEMI Social Determinants of health: Polysubstance use, homelessness     Amount and/or Complexity of Data Reviewed Labs: ordered.  Risk Decision regarding hospitalization.          Final Clinical Impression(s) / ED Diagnoses Final diagnoses:  NSTEMI (non-ST elevated myocardial infarction) [facility])    Rx / DC Orders ED Discharge Orders     None         Rexford Maus, DO 10/30/23 1200    Rexford Maus, DO 10/30/23 1201

## 2023-10-30 NOTE — Consult Note (Addendum)
 Cardiology Consultation   Patient ID: [name] MRN: [id number]; DOB: [birth year]  Admit date: 10/30/2023 Date of Consult: 10/30/2023  PCP:  Patient, No Pcp Per   [facility] Providers Cardiologist:  Remotely Dr. Jodie Echevaria here to update MD or APP on Care Team, Refresh:1}     Patient Profile:   Timothy Peck is a 63 y.o. male with a hx of CAD with NSTEMI 01/2012 with DES to prox LAD, elevated troponin 2014 with patent LAD stent thought possibly due to myopericarditis, prior PE 2014, CKD 3a by labs, substance use including ongoing cocaine use, noncompliance, prior incarceration, paranoid schizophrenia, homelessness, prior CVA with left sided deficits, HTN, remote OSA not on CPAP, tobacco abuse, aortic atherosclerosis who is being seen 10/30/2023 for the evaluation of abnormal EKG/elevated troponin at the request of Dr. Theresia Lo.  History of Present Illness:   Timothy Peck carries the above history with last cath 2017 showing patent prior LAD stent, 100% RPDA lesion too small for PCI (also felt too small to be source for angina), LVEDP normal, EF greater than 65%. Echo at that time showed EF 65-70%, moderate LVH, mild LAE, moderate TR. He was last seen in the office in 2021 with continued chronic chest pain felt due to costochondritis. There were multiple social stressors noted including inconsistent housing, financial constraints, and ongoing substance abuse. He has not followed up since that time. He has been seen periodically in the ED for orthopedic issues. Of note, he also carries history of unprovoked bilateral PE in 2014 for which he was prescribed Xarelto with plan for 6 months of treatment, then brief interruption for hypercoag workup followed by resumption. This was not clearly accomplished. He was then admitted for CVA in 2015 felt possibly embolic in nature, not taking medications as prescribed, started on warfarin by neurology that time. Warfarin was re-prescribed at  discharge in 2017 (had not been taking PTA). Neurology also corroborated need for hypercoag workup in the future. He saw primary care a few times after that but INR remained subtherapeutic and he was subsequently lost to follow-up.  Per triage note, patient was brought to the ED via EMS after pt was found to be at a friend's house pounding on the door for 45 min. [employer] was notified and arrived on scene when pt asked [employer] to call EMS for sudden onset CP. Pt was alert, but found to be diaphoretic & hypotensive in the field 80/50. He received 250cc bolus and 324mg  ASA with f/u BP 96/50. The patient states he was on the way to the store when this happened. He was going to go smoke marijuana. The room is extremely pungent of heavy marijuana. He last used cocaine yesterday. He reports he uses drugs because he has a debilitating bone condition eating away at his bones. He states he has had chronic chest pain that comes and goes, somewhat vague historian, no clear provoking/alleviating factors. This particular episode "hit him all at once" and was associated with some SOB and nausea as well. Subsequently 105/77 in ED, POx 99% RA. Labs show hsTroponin 34->32, K 3.3, AKI with Cr 1.84 (previously 1.2-1.4 range), CBC OK, UDS pending. No imaging at this time. EKG shows NSR 68bpm, biatrial enlargement, suspected LVH with secondary repolarization changes accentuated from prior with rounded ST segment in V2-V3, TWI I, avL, V4-V6, biphasic appearing STTW segment in V4, and nonspecific ST changes inferiorly, borderline prolonged QTC. He was started on IV heparin. He reports chest pain has  subsequently subsided.  Past Medical History:  Diagnosis Date   Asthma    Bilateral pulmonary embolism (HCC) 03/2013   CAD S/P percutaneous coronary angioplasty 02/2012   NSTEMI - PCI to prox LAD   CKD stage 3a, GFR 45-59 ml/min (HCC)    Degenerative disc disease, cervical    Dx years ago   Depression    H/o ischemic right ica stroke  09/2013   Hypertension    OSA (obstructive sleep apnea)    Diagnosis long ago, used to use CPAP, not currently on any treatment.   Polysubstance abuse (HCC)    Past history of cocaine and marijuana, both smoking. Also long standing tobacco abuse.    Presence of drug coated stent in LAD coronary artery 02/2012   Promus Element DES 4.0 mm x 28 mm; converted from Brilinta to aspirin during stroke evaluation in February 2015    Past Surgical History:  Procedure Laterality Date   ANTERIOR CERVICAL DECOMP/DISCECTOMY FUSION  11/28/2011   Procedure: ANTERIOR CERVICAL DECOMPRESSION/DISCECTOMY FUSION 1 LEVEL;  Surgeon: Temple Pacini, MD;  Location: [facility] NEURO ORS;  Service: Neurosurgery;  Laterality: N/A;  Anterior Cervical Six-Seven Decompression and Fusion   CARDIAC CATHETERIZATION N/A 07/08/2016   Procedure: Left Heart Cath and Coronary Angiography;  Surgeon: Marykay Lex, MD;  Location: [facility] INVASIVE CV LAB;  Service: Cardiovascular;  Laterality: N/A;   LEFT HEART CATHETERIZATION WITH CORONARY ANGIOGRAM N/A 01/31/2012   Procedure: LEFT HEART CATHETERIZATION WITH CORONARY ANGIOGRAM;  Surgeon: Runell Gess, MD;  Location: [facility] CATH LAB;  Service: Cardiovascular: NSTEMI -  p-mLAD ~80% thrombotic lesion -> treated with [id number] Inhibitor & staged PCI   LEFT HEART CATHETERIZATION WITH CORONARY ANGIOGRAM N/A 12/17/2012   Procedure: LEFT HEART CATHETERIZATION WITH CORONARY ANGIOGRAM;  Surgeon: Marykay Lex, MD;  Location: [facility] C[facility] LAB;  Service: Cardiovascular: Patent LAD DES.  CP thought to be Non-anginal   LEFT HEART CATHETERIZATION WITH CORONARY ANGIOGRAM N/A 07/18/2013   Procedure: LEFT HEART CATHETERIZATION WITH CORONARY ANGIOGRAM;  Surgeon: Marykay Lex, MD;  Location: [facility] CATH LAB;  Service: Cardiovascular: patent stent, stable CAD.  ? Sx related to Myopericarditis.   PERCUTANEOUS CORONARY STENT INTERVENTION (PCI-S) N/A 02/02/2012   Procedure: PERCUTANEOUS CORONARY STENT INTERVENTION (PCI-S);   Surgeon: Marykay Lex, MD;  Location: [facility] CATH LAB;  Service: Cardiovascular: Staged LAD PCI - Promus DES 4x28.     TUMOR REMOVAL     left foot third toe     Home Medications:  Prior to Admission medications   Not on File    Inpatient Medications: Scheduled Meds:  [START ON 10/31/2023] aspirin EC  81 mg Oral Daily   lidocaine  1 patch Transdermal Q24H   potassium chloride  40 mEq Oral Once   Continuous Infusions:  heparin 1,000 Units/hr (10/30/23 1223)   PRN Meds:   Allergies:    Allergies  Allergen Reactions   Ibuprofen Other (See Comments)    Doctor told him he could not take    Social History:   Social History   Socioeconomic History   Marital status: Divorced    Spouse name: Not on file   Number of children: Not on file   Years of education: Not on file   Highest education level: Not on file  Occupational History   Not on file  Tobacco Use   Smoking status: Every Day    Current packs/day: 1.00    Average packs/day: 1 pack/day for 52.8 years (52.8 ttl pk-yrs)  Types: Cigarettes    Start date: 01/12/1971   Smokeless tobacco: Never   Tobacco comments:    3 cigs daily  Vaping Use   Vaping status: Never Used  Substance and Sexual Activity   Alcohol use: No    Comment: Report being abstinent since ~January 2015   Drug use: Yes    Types: Cocaine, Marijuana, Other-see comments    Comment: last used a month ago   Sexual activity: Yes    Birth control/protection: None  Other Topics Concern   Not on file  Social History Narrative   He tells me that since his father-in-law, who they were caring for completely, died in Jan 12, 2018he and his wife have basically been homeless.   He is currently living in the mouth in a different hotel or motel in about a month.      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      Uses marijuana and cocaine to "self medicate "for his chronic pain issues".   Social Drivers of Corporate investment banker Strain:  Not on file  Food Insecurity: No Food Insecurity (10/30/2023)   Hunger Vital Sign    Worried About Running Out of Food in the Last Year: Never true    Ran Out of Food in the Last Year: Never true  Transportation Needs: Unmet Transportation Needs (10/30/2023)   PRAPARE - Administrator, Civil Service (Medical): Yes    Lack of Transportation (Non-Medical): Yes  Physical Activity: Not on file  Stress: Not on file  Social Connections: Not on file  Intimate Partner Violence: Not At Risk (10/30/2023)   Humiliation, Afraid, Rape, and Kick questionnaire    Fear of Current or Ex-Partner: No    Emotionally Abused: No    Physically Abused: No    Sexually Abused: No    Family History:   Family History  Problem Relation Age of Onset   Coronary artery disease Father        died of MI   Stroke Father 72   Heart disease Father    Diabetes Father    Aneurysm Mother 45       intracranial aneurysm rupture   Hypertension Mother    Hypertension Maternal Grandmother    Diabetes Maternal Grandmother    Hypertension Paternal Grandmother    Hypertension Paternal Grandfather    Cancer Maternal Aunt    Anesthesia problems Neg Hx    Hypotension Neg Hx    Malignant hyperthermia Neg Hx    Pseudochol deficiency Neg Hx      ROS:  Please see the history of present illness.  All other ROS reviewed and negative.     Physical Exam/Data:   Vitals:   10/30/23 1005 10/30/23 1014  BP: 105/77   Pulse: 70   Resp: 16   Temp: (!) 97.4 F (36.3 C)   TempSrc: Oral   SpO2: 99% 100%  Weight:  77.1 kg  Height:  6\' 4"  (1.93 m)   No intake or output data in the 24 hours ending 10/30/23 1317    10/30/2023   10:14 AM 06/11/2020    2:32 PM 12/27/2018    4:39 PM  Last 3 Weights  Weight (lbs) 170 lb 157 lb 3.2 oz 170 lb  Weight (kg) 77.111 kg 71.305 kg 77.111 kg     Body mass index is 20.69 kg/m.  General: Frail appearing AAM in no acute distress. Appears older than stated age. Head:  Normocephalic, atraumatic, sclera non-icteric, no xanthomas, nares are without discharge. Poor dentition. Neck: Negative for carotid bruits. JVP not elevated. Lungs: Clear bilaterally to auscultation without wheezes, rales, or rhonchi. Breathing is unlabored. Heart: RRR S1 S2 without murmurs, rubs, or gallops.  Abdomen: Soft, non-tender, non-distended with normoactive bowel sounds. No rebound/guarding. Extremities: No clubbing or cyanosis. No edema. Distal pedal pulses are 2+ and equal bilaterally. Neuro: Alert and oriented X 3. Moves all extremities spontaneously. Psych:  Responds to questions appropriately with a normal affect.  EKG:  The EKG was personally reviewed and demonstrates:  NSR 68bpm, biatrial enlargement, suspected LVH with secondary repolarization changes accentuated from prior with rounded ST segment in V2-V3, TWI I, avL, V4-V6, biphasic appearing STTW segment in V4, and nonspecific ST changes inferiorly, borderline prolonged QTC.  Telemetry:  Telemetry was personally reviewed and demonstrates:  NSR  Relevant CV Studies: Echo 2017   - Left ventricle: The cavity size was normal. Wall thickness was    increased in a pattern of moderate LVH. Systolic function was    vigorous. The estimated ejection fraction was in the range of 65%    to 70%. Wall motion was normal; there were no regional wall    motion abnormalities.  - Left atrium: The atrium was mildly dilated.  - Tricuspid valve: There was moderate regurgitation.   Impressions:   - Compared to the prior study, there has been no significant    interval change.   Cath 2017  Prox LAD BMS (Vision 4.0 x 28 from 2013), 5 %stenosed. RPDA lesion, 100 %stenosed - actually a very small distal branch in the inferoapex. - Very minimal distal flow and a <1 mm vessel. LV end diastolic pressure is normal. The left ventricular ejection fraction is greater than 65% by visual estimate.   Angiographically widely patent LAD stent  with brisk flow down the LAD. Potential etiology for what looks like a probable fixed inferoapical defect is either the prior LAD non-STEMI from 2013 versus very tiny distal portion of the PDA that appears to be occluded. This vessel is too small for PCI. Would also likely be too small to consider a source for angina.   Likely nonanginal chest pain. The patient has multiple different etiologies for chest pain including muscle skeletal pain, prior PE related pain as well as history of pericarditis.   He could have intermittent coronary spasm, however there is no angiographic evidence to suggest a potential lesion for spasm.   Plan: Return to nursing unit for ongoing care. TR band removal per protocol. Would probably need to restart on warfarin for his recurrent PEs.    Laboratory Data:  High Sensitivity Troponin:   Recent Labs  Lab 10/30/23 1018  TROPONINIHS 34*     Chemistry Recent Labs  Lab 10/30/23 1018  NA 141  K 3.3*  CL 107  CO2 23  GLUCOSE 134*  BUN 14  CREATININE 1.84*  CALCIUM 8.3*  GFRNONAA 41*  ANIONGAP 11    Hematology Recent Labs  Lab 10/30/23 1018  WBC 8.7  RBC 4.60  HGB 14.3  HCT 43.5  MCV 94.6  MCH 31.1  MCHC 32.9  RDW 13.3  PLT 226   Radiology/Studies:  No results found.   Assessment and Plan:   1. Chest pain, hypotension, elevated troponin, hx of CAD - hsTroponin 34->32 (reassuring trend) - EKG abnormal, Yvonna Alanis PA-C (cardmaster today) reviewed with interventionalist Dr. Clifton James who did not feel this represented STEMI activation, Dr. Excell Seltzer also agrees  changes may be due to LVH/cocaine use - received ASA 324mg  by EMS, started on IV heparin here - avoid BB given cocaine use, hypotension - hold off nitrates given hypotension - check LFTs and lipids in AM and consider adding back statin if normal liver function - CXR pending; consider additional imaging if mediastinal contours are widened - will discuss further with Dr. Excell Seltzer   2.  AKI on suspected CKD 3a - avoid nephrotoxic agents - per primary team  3. Polysubstance abuse with social concerns, homelessness, medication noncompliance - UDS pending - recurrent issue, unfortunately presenting major hazards to health - recommend social work involvement  4. Hypokalemia - give KCl x 1 - check Mg level  5. H/o remote PE, CVA - previously recommended by neurology for anticoagulation with warfarin with future hypercoag panel but patient nonadherent with medication and follow-up - defer long term anticoag plans and whether he requires VTE workup this admission to primary team   Risk Assessment/Risk Scores:     TIMI Risk Score for Unstable Angina or Non-ST Elevation MI:   The patient's TIMI risk score is 5, which indicates a 26% risk of all cause mortality, new or recurrent myocardial infarction or need for urgent revascularization in the next 14 days.  For questions or updates, please contact [facility] Please consult [email or web address] for contact info under    Signed, Laurann Montana, PA-C  10/30/2023 1:17 PM  Patient seen, examined. Available data reviewed. Agree with findings, assessment, and plan as outlined by Ronie Spies, PA-C.  Patient is independently interviewed and examined.  He is a 63 year old male, alert and oriented in no distress.  He has poor dentition.  JVP is normal, carotid upstrokes are normal without bruits, lungs are clear bilaterally, heart is regular rate and rhythm no murmur gallop, abdomen is soft, nontender, with no masses, extremities have no edema, skin is warm and dry with no rash. High-sensitivity troponin is minimally elevated in a flat trend with readings of 34 and 32, respectively.  Patient has evidence of AKI with a creatinine of 1.84 today.  Baseline appears to be about 1.2-1.4.  The patient's EKG shows sinus rhythm with LVH and repolarization abnormalities cannot rule out ischemia.  I have spoken with the patient at length.  He  is very difficult social determinants of health as he is homeless and regularly using cocaine.  He has not been compliant with any prescription medicines or medical follow-up.  He is not a candidate for invasive evaluation with cardiac catheterization and/or PCI.  The risk/benefit for intervention is very unfavorable as he is unlikely to adhere to dual antiplatelet therapy or GDMT as needed.  Reasonable to continue him on heparin overnight but I would discontinue this in the morning.  His troponin is not trending in a way that suggests a significant infarct.  Will await his 2D echocardiogram result.  Reasonable to treat him with antiplatelet therapy on aspirin 81 mg daily.  With his history of coronary artery disease would start him on a statin drug pending LFTs.  In the context of AKI, I would not start him on an ACE/ARB/ARNI even if LV dysfunction is found.  He is not a candidate for a beta-blocker with ongoing cocaine use.  Tonny Bollman, M.D. 10/30/2023 2:38 PM

## 2023-10-30 NOTE — Progress Notes (Signed)
 PHARMACY - ANTICOAGULATION CONSULT NOTE  Pharmacy Consult for heparin Indication: chest pain/ACS  Allergies  Allergen Reactions   Ibuprofen Other (See Comments)    Doctor told him he could not take    Patient Measurements: Height: 6\' 4"  (193 cm) Weight: 70.6 kg (155 lb 9.6 oz) IBW/kg (Calculated) : 86.8 HEPARIN DW (KG): 77.1  Vital Signs: Temp: 97.8 F (36.6 C) (03/28 1510) Temp Source: Oral (03/28 1510) BP: 122/93 (03/28 1510) Pulse Rate: 70 (03/28 1315)  Labs: Recent Labs    10/30/23 1018 10/30/23 1108 10/30/23 1218 10/30/23 1410 10/30/23 1726  HGB 14.3  --   --   --   --   HCT 43.5  --   --   --   --   PLT 226  --   --   --   --   HEPARINUNFRC  --  <0.10*  --   --  0.32  CREATININE 1.84*  --   --   --   --   CKTOTAL  --   --   --  177  --   TROPONINIHS 34*  --  32*  --   --     Estimated Creatinine Clearance: 42.6 mL/min (A) (by C-G formula based on SCr of 1.84 mg/dL (H)).   Medical History: Past Medical History:  Diagnosis Date   Asthma    Bilateral pulmonary embolism (HCC) 03/2013   CAD S/P percutaneous coronary angioplasty 02/2012   NSTEMI - PCI to prox LAD   CKD stage 3a, GFR 45-59 ml/min (HCC)    Degenerative disc disease, cervical    Dx years ago   Depression    H/o ischemic right ica stroke 09/2013   Hypertension    OSA (obstructive sleep apnea)    Diagnosis long ago, used to use CPAP, not currently on any treatment.   Polysubstance abuse (HCC)    Past history of cocaine and marijuana, both smoking. Also long standing tobacco abuse.    Presence of drug coated stent in LAD coronary artery 02/2012   Promus Element DES 4.0 mm x 28 mm; converted from Brilinta to aspirin during stroke evaluation in February 2015    Assessment: 49 YOM presenting with CP, hx CAD, he has hx of PE and was on anticoagulation in years past but no longer taking any medications. Pharmacy to dose IV heparin.  Initial heparin level therapeutic at 0.32, will increase  slightly since on lower end of range.  Goal of Therapy:  Heparin level 0.3-0.7 units/ml Monitor platelets by anticoagulation protocol: Yes   Plan:  Increase heparin to 1100 units/h Daily heparin level and CBC  Fredonia Highland, PharmD, [place], [facility] Clinical Pharmacist [phone] Please check AMION for all [facility] Pharmacy numbers 10/30/2023

## 2023-10-30 NOTE — ED Triage Notes (Signed)
 Pt to ED via EMS after pt was found to be at a friend's house pounding on the door for 45 min. [employer] was notified and arrived on scene when pt asked [employer] to call EMS for sudden onset CP. Pt was alert, but found to be diaphoretic & hypotensive in the field 80/50 now 96/50.  20g left hand. NSS. 324mg  asa. C/o SOB- EMS did not appreciate SOB on their exam.

## 2023-10-30 NOTE — Hospital Course (Addendum)
 Timothy Peck is a 63 y.o. 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    NSTEMI  Hx of CAD w/ PCI 2013 Cocaine Use Presented with symptoms of substernal chest pain, diaphoresis, B/l UE weakness and numbness. Labs notable for Troponin 34 -> 32. EKG with new findings of T wave inversions in the lateral leads. Cardiology consulted per EDP. Patient is started on IV heparin. Etiology more likely secondary to cocaine use resulting in cardiac vessels vasospasm with already hx of CAD.  Patient was initially started with IV heparin, he has been transition to aspirin and statin.  Labs showed a normal TSH, normal CK, LDL 51.  He was given Voltaren gel, lidocaine patches.  Avoid beta-blocker in the setting of cocaine use.   AKI on CKD 3a  Presented with Cr 1.84 (baseline 1.2-1.4) likely in the setting of poor PO intake, hypotension and dehydration.  He was started on lactated Ringer's infusion for 10 hours, serum creatinine has improved.  Patient is counseled on to not take any NSAIDs such as BC powders, ibuprofen, Aleve on daily basis.   Cough Reported symptoms of cough, congestion and sore throat. No fevers. Lung exam clear wo wheezing or crackles.  Chest x-ray without any acute findings.  Respiratory panel negative.  Low concern for pneumonia.  As patient is afebrile and no leukocytosis.   Hypotension-resolved Presented with concerns of low blood pressure with 96/50 and diaphoresis. BP stable presently after receiving LR infusion.  Patient is started on home medicine amlodipine 5 mg daily.   History of Polysubstance abuse  Medication  noncompliance SDOH Homeless with history of medication noncompliance. He reports smoking cocaine about a $100 worth today and yesterday, states that he smokes it for joint pain. Denies any alcohol use, but appears somnolent.  UDS was positive for cocaine and THC.  Alcohol less than 10.  Patient was placed on CIWA without Ativan, CIWA score 0.  TOC was consulted for PCP.   History of CVA 2015 Prior Hx of PE 2014 He was previously on warfarin, however did not take it. Noncompliant with DOAC. Presently not on any anticoagulation. He reports that they were expensive.  Patient was initially started on IV heparin, now transition to aspirin and statin.   ================================================================ Timothy Peck came to the hospital for Chest pain, found to have heart attack   Please take the following medications as prescribed.  For your blood pressure and your heart -Take amlodipine 5 mg, 1 tablet by mouth daily -Take aspirin 81 mg, 1 tablet by mouth daily -Take Lipitor 80 mg, 1 tablet by mouth daily  Please stop using cocaine, it increases your risk of future heart attacks.  I advise you to follow-up with a primary care doctor for your chronic conditions.   If you have any of these following symptoms, please call us or seek care at an emergency department: -Chest Pain -Difficulty Breathing -Worsening abdominal pain -Syncope (passing out) -Drooping of face -Slurred speech -Sudden weakness in your leg or arm -Fever -Chills -blood in the stool -dark black, sticky stool  If you have any questions or concerns, call our clinic  at [phone] or after hours call [phone] and ask for the internal medicine resident on call.  I am glad you are feeling better. It was a pleasure taking care for you. I wish a good recovery and good health!   Dr. Jeral Pinch   ===============================================================

## 2023-10-30 NOTE — ED Notes (Signed)
 Got got patient into a gown on the monitor did EKG shown to er provider got patient a warm blanket patient is resting with call bell in reach

## 2023-10-30 NOTE — Progress Notes (Signed)
 PHARMACY - ANTICOAGULATION CONSULT NOTE  Pharmacy Consult for heparin Indication: chest pain/ACS  Allergies  Allergen Reactions   Ibuprofen Other (See Comments)    Doctor told him he could not take    Patient Measurements: Height: 6\' 4"  (193 cm) Weight: 77.1 kg (170 lb) IBW/kg (Calculated) : 86.8 HEPARIN DW (KG): 77.1  Vital Signs: Temp: 97.4 F (36.3 C) (03/28 1005) Temp Source: Oral (03/28 1005) BP: 105/77 (03/28 1005) Pulse Rate: 70 (03/28 1005)  Labs: Recent Labs    10/30/23 1018  HGB 14.3  HCT 43.5  PLT 226  CREATININE 1.84*  TROPONINIHS 34*    Estimated Creatinine Clearance: 46.6 mL/min (A) (by C-G formula based on SCr of 1.84 mg/dL (H)).   Medical History: Past Medical History:  Diagnosis Date   Asthma    Bilateral pulmonary embolism [facility]) August 2014   CAD S/P percutaneous coronary angioplasty 02/2012   NSTEMI - PCI to prox LAD   Chronic anticoagulation December 2014   Switch from Xarelto to warfarin.   Degenerative disc disease, cervical    Dx years ago   Depression    H/o ischemic right ica stroke February 2015   Hypertension    NSTEMI (non-ST elevated myocardial infarction) (HCC)    07/18/13, stable cath with patent stent; preserved EF by echo; follow-up Echo for CVA 09/2013: EF 60%, mild LVH. No RDW and a. No cardiac source for embolism noted. Bubble study was not performed   NSTEMI (non-ST elevated myocardial infarction), 07/18/13, stable cath with patent stent. 01/31/2012   OSA (obstructive sleep apnea)    Diagnosis long ago, used to use CPAP, not currently on any treatment.   Polysubstance abuse (HCC)    Past history of cocaine and marijuana, both smoking. Also long standing tobacco abuse.    Presence of drug coated stent in LAD coronary artery 02/2012   Promus Element DES 4.0 mm x 28 mm; converted from Brilinta to aspirin during stroke evaluation in February 2015    Assessment: 51 YOM presenting with CP, hx CAD, he has hx of PE and was on  anticoagulation in years past but no longer taking any medications.    Goal of Therapy:  Heparin level 0.3-0.7 units/ml Monitor platelets by anticoagulation protocol: Yes   Plan:  Heparin 4000 units IV x 1, and gtt at 1000 units/hr F/u 6 hour heparin level F/u cards eval and recs  Daylene Posey, PharmD, [facility] Clinical Pharmacist ED Pharmacist Phone # [phone] 10/30/2023 12:13 PM

## 2023-10-31 ENCOUNTER — Other Ambulatory Visit (HOSPITAL_COMMUNITY): Payer: Self-pay

## 2023-10-31 ENCOUNTER — Inpatient Hospital Stay (HOSPITAL_COMMUNITY)

## 2023-10-31 DIAGNOSIS — R079 Chest pain, unspecified: Secondary | ICD-10-CM

## 2023-10-31 LAB — CBC WITH DIFFERENTIAL/PLATELET
Abs Immature Granulocytes: 0.04 10*3/uL (ref 0.00–0.07)
Basophils Absolute: 0 10*3/uL (ref 0.0–0.1)
Basophils Relative: 0 %
Eosinophils Absolute: 0.5 10*3/uL (ref 0.0–0.5)
Eosinophils Relative: 6 %
HCT: 41.1 % (ref 39.0–52.0)
Hemoglobin: 14 g/dL (ref 13.0–17.0)
Immature Granulocytes: 1 %
Lymphocytes Relative: 30 %
Lymphs Abs: 2.6 10*3/uL (ref 0.7–4.0)
MCH: 31.2 pg (ref 26.0–34.0)
MCHC: 34.1 g/dL (ref 30.0–36.0)
MCV: 91.5 fL (ref 80.0–100.0)
Monocytes Absolute: 0.8 10*3/uL (ref 0.1–1.0)
Monocytes Relative: 9 %
Neutro Abs: 4.9 10*3/uL (ref 1.7–7.7)
Neutrophils Relative %: 54 %
Platelets: 229 10*3/uL (ref 150–400)
RBC: 4.49 MIL/uL (ref 4.22–5.81)
RDW: 13.3 % (ref 11.5–15.5)
WBC: 8.8 10*3/uL (ref 4.0–10.5)
nRBC: 0 % (ref 0.0–0.2)

## 2023-10-31 LAB — ECHOCARDIOGRAM COMPLETE
AR max vel: 2.21 cm2
AV Peak grad: 8.8 mmHg
Ao pk vel: 1.48 m/s
Area-P 1/2: 2.91 cm2
Height: 76 in
S' Lateral: 2.7 cm
Weight: 2489.6 [oz_av]

## 2023-10-31 LAB — COMPREHENSIVE METABOLIC PANEL WITH GFR
ALT: 15 U/L (ref 0–44)
AST: 20 U/L (ref 15–41)
Albumin: 3.3 g/dL — ABNORMAL LOW (ref 3.5–5.0)
Alkaline Phosphatase: 65 U/L (ref 38–126)
Anion gap: 5 (ref 5–15)
BUN: 25 mg/dL — ABNORMAL HIGH (ref 6–20)
CO2: 25 mmol/L (ref 22–32)
Calcium: 8.7 mg/dL — ABNORMAL LOW (ref 8.9–10.3)
Chloride: 110 mmol/L (ref 98–111)
Creatinine, Ser: 1.66 mg/dL — ABNORMAL HIGH (ref 0.61–1.24)
GFR, Estimated: 47 mL/min — ABNORMAL LOW (ref >60)
Glucose, Bld: 113 mg/dL — ABNORMAL HIGH (ref 70–99)
Potassium: 4.2 mmol/L (ref 3.5–5.1)
Sodium: 140 mmol/L (ref 135–145)
Total Bilirubin: 0.6 mg/dL (ref 0.0–1.2)
Total Protein: 5.8 g/dL — ABNORMAL LOW (ref 6.5–8.1)

## 2023-10-31 LAB — LIPID PANEL
Cholesterol: 125 mg/dL (ref 0–200)
HDL: 66 mg/dL (ref >40)
LDL Cholesterol: 51 mg/dL (ref 0–99)
Total CHOL/HDL Ratio: 1.9 ratio
Triglycerides: 38 mg/dL (ref <150)
VLDL: 8 mg/dL (ref 0–40)

## 2023-10-31 LAB — HIV ANTIBODY (ROUTINE TESTING W REFLEX): HIV Screen 4th Generation wRfx: NONREACTIVE

## 2023-10-31 LAB — HEPARIN LEVEL (UNFRACTIONATED): Heparin Unfractionated: 0.31 [IU]/mL (ref 0.30–0.70)

## 2023-10-31 MED ORDER — DICLOFENAC SODIUM 1 % EX GEL
4.0000 g | Freq: Four times a day (QID) | CUTANEOUS | 0 refills | Status: AC
  Filled 2023-10-31: qty 100, 6d supply, fill #0

## 2023-10-31 MED ORDER — LIDOCAINE 5 % EX PTCH
1.0000 | MEDICATED_PATCH | CUTANEOUS | 0 refills | Status: AC
  Filled 2023-10-31: qty 30, 30d supply, fill #0

## 2023-10-31 MED ORDER — AMLODIPINE BESYLATE 5 MG PO TABS
5.0000 mg | ORAL_TABLET | Freq: Every day | ORAL | Status: DC
  Administered 2023-10-31: 5 mg via ORAL
  Filled 2023-10-31: qty 1

## 2023-10-31 MED ORDER — ATORVASTATIN CALCIUM 80 MG PO TABS
80.0000 mg | ORAL_TABLET | Freq: Every day | ORAL | 0 refills | Status: AC
  Filled 2023-10-31: qty 30, 30d supply, fill #0

## 2023-10-31 MED ORDER — AMLODIPINE BESYLATE 5 MG PO TABS
5.0000 mg | ORAL_TABLET | Freq: Every day | ORAL | 0 refills | Status: AC
  Filled 2023-10-31: qty 30, 30d supply, fill #0

## 2023-10-31 MED ORDER — ATORVASTATIN CALCIUM 80 MG PO TABS
80.0000 mg | ORAL_TABLET | Freq: Every day | ORAL | Status: DC
  Administered 2023-10-31: 80 mg via ORAL
  Filled 2023-10-31: qty 1

## 2023-10-31 MED ORDER — ASPIRIN 81 MG PO TBEC
81.0000 mg | DELAYED_RELEASE_TABLET | Freq: Every day | ORAL | 0 refills | Status: AC
  Filled 2023-10-31: qty 30, 30d supply, fill #0

## 2023-10-31 NOTE — Progress Notes (Signed)
 Echocardiogram 2D Echocardiogram has been performed.  Destinee Taber N Kmari Brian,RDCS 10/31/2023, 9:48 AM

## 2023-10-31 NOTE — Discharge Instructions (Addendum)
 [name] came to the hospital for Chest pain, found to have heart attack   Please take the following medications as prescribed.  For your blood pressure and your heart -Take amlodipine 5 mg, 1 tablet by mouth daily -Take aspirin 81 mg, 1 tablet by mouth daily -Take Lipitor 80 mg, 1 tablet by mouth daily  Please stop using cocaine, it increases your risk of future heart attacks.  I advise you to follow-up with a primary care doctor for your chronic conditions.   If you have any of these following symptoms, please call us or seek care at an emergency department: -Chest Pain -Difficulty Breathing -Worsening abdominal pain -Syncope (passing out) -Drooping of face -Slurred speech -Sudden weakness in your leg or arm -Fever -Chills -blood in the stool -dark black, sticky stool  If you have any questions or concerns, call our clinic at [phone] or after hours call [phone] and ask for the internal medicine resident on call.  I am glad you are feeling better. It was a pleasure taking care for you. I wish a good recovery and good health!   Dr. Jeral Pinch   Community Resource Guide Shelters The [employer]'s "211" is a great source of information about community services available.  Access by dialing 2-1-1 from anywhere in [place], or by website -  PooledIncome.pl.   Other Armed forces technical officer Number and Address  [facility] Housing for homeless and needy men with substance abuse issues 5 day Covid Quarantine [phone] [address] [place], [place]  [employer] of [employer] Emergency assistance for [employer] only [employer] [phone] [address] [place], [place]  [employer] of the [place] Domestic violence shelter for women and their children [phone] [place], [place]  [facility] Domestic violence shelter for women and their  children Each family gets their own unit and can quarantine after admission. [phone] [place], [place]  [facility]) / Resources for the [employer] center for the homeless Information and referral to housing resources Counseling [employer] La[employer] Barbershop Phone bank Mailroom Computer lab Medical clinic Bike maintenance center 14 Day covid quarantine [phone] [address] [place], [place]  [facility] [employer] Men[facility] Emergency housing Food Emergency financial assistance Permanent supportive housing [phone] [address] [place], [place]  [employer] Crisis assistance Medication Housing Food Utility assistance [phone] [address] [place], [place]   [phone] [address], [place], [place]  [employer] of [place]       Transitional housing Case Chartered certified accountant assistance [phone] [address] [place], [place]  [facility], [employer] for adult men and women Can admit with MD clearance from the hospital after positive covid test.  Intake Hotline [phone] [address] [place], [place]  24-hour Crisis Line for those Facing Homelessness   Information and referral to community resources 9365496307  [employer] and additional resources. Can admit with MD clearance after positive covid test.  Prefer online applications (blocked on [facility] computers)/ currently full. Will be able to do intake at the office starting in May.  [phone] Admin only location     [facility]) now has a full time staff to take referrals for all individuals needing shelter or housing placement. They do not do direct services or  have beds, but are in charge of assessing and coordinating placement for individuals needing shelter. The phone number for  coordinated entry is [phone].

## 2023-10-31 NOTE — Discharge Summary (Signed)
 Name: Timothy Peck MRN: [id number] DOB: [birth year] 63 y.o. PCP: Patient, No Pcp Per  Date of Admission: 10/30/2023  9:51 AM Date of Discharge:  10/31/2023 Attending Physician: Dr. Antony Contras  DISCHARGE DIAGNOSIS:  Primary Problem: Chest pain   Hospital Problems: Principal Problem:   Chest pain Active Problems:   Substance abuse (HCC)   CAD S/P percutaneous coronary angioplasty 02/21/12 non-STEMI -Promus DES 4.0 mm 20 mm to prox LAD   H/o ischemic right ica stroke   Noncompliance with medications   Cocaine abuse (HCC)   Essential hypertension   Acute kidney injury (HCC)    DISCHARGE MEDICATIONS:   Allergies as of 10/31/2023       Reactions   Ibuprofen Other (See Comments)   Doctor told him he could not take        Medication List     TAKE these medications    amLODipine 5 MG tablet Commonly known as: NORVASC Take 1 tablet (5 mg total) by mouth daily.   aspirin EC 81 MG tablet Take 1 tablet (81 mg total) by mouth daily. Swallow whole.   atorvastatin 80 MG tablet Commonly known as: LIPITOR Take 1 tablet (80 mg total) by mouth daily.   lidocaine 5 % Commonly known as: LIDODERM Place 1 patch onto the skin daily. Remove & Discard patch within 12 hours or as directed by MD   Voltaren Arthritis Pain 1 % Gel Generic drug: diclofenac Sodium Apply 4 g topically 4 (four) times daily for 6 days.        DISPOSITION AND FOLLOW-UP:  Timothy Peck was discharged from [facility] in Good condition. At the hospital follow up visit please address:  NSTEMI hx of CAD w/ PCI 2/2 cocaine use: discharged on aspirin, Lipitor. Counseled on cocaine cessation.  AKI on CKD 3a: Improving, encourage PO hydration.  HTN: Discharged on amlodipine 5 mg.   Patient has history of noncompliance on medication due to homelessness and unable to afford medications. Patient is advised to follow up with cardiology and PCP.    Follow-up Recommendations: Consults:  Cardiology Labs: Basic Metabolic Profile and CBC Studies: None Medications: Aspirin 81 mg, Lipitor 80 mg, amlodipine 5 mg daily      HOSPITAL COURSE:  Patient Summary: Timothy Peck is a 63 y.o. 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    NSTEMI  Hx of CAD w/ PCI 2013 Cocaine Use Presented with symptoms of substernal chest pain, diaphoresis, B/l UE weakness and numbness. Labs notable for Troponin 34 -> 32. EKG with new findings of T wave inversions in the lateral leads. Cardiology consulted per EDP. Patient is started on IV heparin. Etiology more likely secondary to cocaine use resulting in cardiac vessels vasospasm with already hx of CAD.  Patient was initially started with IV heparin, he has been transition to aspirin and statin.  Labs showed a normal TSH, normal CK, LDL 51.  He was given Voltaren gel, lidocaine patches.  Avoid beta-blocker in the setting of cocaine use. ECHO with EF 65-70%, no regional wall motion abnormalities, G1DD, normal RV. Recommend Op follow up with cards and continue  Aspirin, Statin and amlodipine.   AKI on CKD 3a  Presented with Cr 1.84 (baseline 1.2-1.4) likely in the setting of poor PO intake, hypotension and dehydration.  He was started on lactated Ringer's infusion for 10 hours, serum creatinine has improved.  Patient is counseled on to not take any NSAIDs such as BC powders, ibuprofen, Aleve on daily basis.   Cough Reported symptoms of cough, congestion and sore throat. No fevers. Lung exam clear wo wheezing or crackles.  Chest x-ray without any acute findings.  Respiratory panel negative.   Low concern for pneumonia.  As patient is afebrile and no leukocytosis.   Hypotension-resolved Presented with concerns of low blood pressure with 96/50 and diaphoresis. BP stable presently after receiving LR infusion.  Patient is started on home medicine amlodipine 5 mg daily.   History of Polysubstance abuse  Medication noncompliance SDOH Homeless with history of medication noncompliance. He reports smoking cocaine about a $100 worth today and yesterday, states that he smokes it for joint pain. Denies any alcohol use, but appears somnolent.  UDS was positive for cocaine and THC.  Alcohol less than 10.  Patient was placed on CIWA without Ativan, CIWA score 0.  TOC was consulted for PCP.   History of CVA 2015 Prior Hx of PE 2014 He was previously on warfarin, however did not take it. Noncompliant with DOAC. Presently not on any anticoagulation. He reports that they were expensive.  Patient was initially started on IV heparin, now transition to aspirin and statin.    DISCHARGE INSTRUCTIONS:   Discharge Instructions     Diet - low sodium heart healthy   Complete by: As directed    Discharge instructions   Complete by: As directed    Timothy Peck  You came to the hospital for Chest pain, found to have heart attack   Please take the following medications as prescribed.  For your blood pressure and your heart -Take amlodipine 5 mg, 1 tablet by mouth daily -Take aspirin 81 mg, 1 tablet by mouth daily -Take Lipitor 80 mg, 1 tablet by mouth daily  Please stop using cocaine, it increases your risk of future heart attacks.  I advise you to follow-up with a primary care doctor for your chronic conditions.   If you have any of these following symptoms, please call us or seek care at an emergency department: -Chest Pain -Difficulty Breathing -Worsening abdominal pain -Syncope (passing out) -Drooping of face -Slurred speech -Sudden weakness in your leg or arm -Fever -Chills -blood in  the stool -dark black, sticky stool  If you have any questions or concerns, call our clinic at [phone] or after hours call [phone] and ask for the internal medicine resident on call.  I am glad you are feeling better. It was a pleasure taking care for you. I wish a good recovery and good health!   Dr. Jeral Pinch   Increase activity slowly   Complete by: As directed        SUBJECTIVE:  Patient is evaluated at bedside, reports chest wall tenderness and shoulder tenderness with movement. Denies any shortness of breath, no cough. No abdominal pain, nausea or vomiting.  Discharge Vitals:   BP 120/75 (BP Location: Right Arm)   Pulse 78   Temp 99 F (37.2 C) (Oral)   Resp 20   Ht 6\' 4"  (1.93 m)   Wt 70.6 kg   SpO2 98%   BMI 18.94 kg/m   OBJECTIVE:  Physical Exam  General: laying  in bed Cardiovascular: RR, no murmurs appreciated  Pulm: breathing comfortably, no wheezing or crackles Abd: mild tenderness, soft, bowel sounds present MSK: No LE edema bilaterally  Pertinent Labs, Studies, and Procedures:     Latest Ref Rng & Units 10/31/2023    4:03 AM 10/30/2023   10:18 AM 02/22/2023    9:02 AM  CBC  WBC 4.0 - 10.5 K/uL 8.8  8.7  7.8   Hemoglobin 13.0 - 17.0 g/dL 16.1  09.6  04.5   Hematocrit 39.0 - 52.0 % 41.1  43.5  41.0   Platelets 150 - 400 K/uL 229  226  261        Latest Ref Rng & Units 10/31/2023    4:03 AM 10/30/2023    2:10 PM 10/30/2023   10:18 AM  CMP  Glucose 70 - 99 mg/dL 409   811   BUN 6 - 20 mg/dL 25   14   Creatinine 9.14 - 1.24 mg/dL 7.82   9.56   Sodium 213 - 145 mmol/L 140   141   Potassium 3.5 - 5.1 mmol/L 4.2   3.3   Chloride 98 - 111 mmol/L 110   107   CO2 22 - 32 mmol/L 25   23   Calcium 8.9 - 10.3 mg/dL 8.7   8.3   Total Protein 6.5 - 8.1 g/dL 5.8  7.2    Total Bilirubin 0.0 - 1.2 mg/dL 0.6  0.9    Alkaline Phos 38 - 126 U/L 65  61    AST 15 - 41 U/L 20  23    ALT 0 - 44 U/L 15  17      DG Chest Portable 1 View Result Date:  10/30/2023 CLINICAL DATA:  Chest pain. EXAM: PORTABLE CHEST 1 VIEW COMPARISON:  09/13/2018. FINDINGS: The heart size and mediastinal contours are within normal limits. No focal consolidation, sizeable pleural effusion, or pneumothorax. No acute osseous abnormality. IMPRESSION: No acute cardiopulmonary findings. Electronically Signed   By: Hart Robinsons M.D.   On: 10/30/2023 14:22     Signed: Jeral Pinch, D.O.  Internal Medicine Resident, PGY-1 [facility]  Pager: [phone] 1:01 PM, 10/31/2023

## 2023-10-31 NOTE — Progress Notes (Signed)
 Pt off floor for Echo

## 2023-10-31 NOTE — Progress Notes (Signed)
Pt returned to floor from echo

## 2023-10-31 NOTE — Progress Notes (Signed)
 Spoke to pt re homelessness. Pt states he was staying with a friend prior to admission but unable to return since he "called the ambulance." Provided pt with shelter/housing resources and bus passes. Pt states he is making calls now to find another place to stay. RN updated.   Dellie Burns, MSW, LCSW [phone] (coverage)

## 2023-11-02 ENCOUNTER — Other Ambulatory Visit: Payer: Self-pay

## 2024-01-05 ENCOUNTER — Encounter (HOSPITAL_COMMUNITY): Payer: Self-pay | Admitting: Emergency Medicine

## 2024-01-05 ENCOUNTER — Other Ambulatory Visit: Payer: Self-pay

## 2024-01-05 ENCOUNTER — Emergency Department (HOSPITAL_COMMUNITY): Payer: MEDICAID

## 2024-01-05 ENCOUNTER — Emergency Department (HOSPITAL_COMMUNITY)
Admission: EM | Admit: 2024-01-05 | Discharge: 2024-01-06 | Disposition: A | Discharge status: Home / Self Care | Payer: MEDICAID

## 2024-01-05 DIAGNOSIS — N1831 Chronic kidney disease, stage 3a: Secondary | ICD-10-CM | POA: Diagnosis not present

## 2024-01-05 DIAGNOSIS — Z5900 Homelessness unspecified: Secondary | ICD-10-CM | POA: Diagnosis not present

## 2024-01-05 DIAGNOSIS — R079 Chest pain, unspecified: Secondary | ICD-10-CM | POA: Insufficient documentation

## 2024-01-05 DIAGNOSIS — Z7982 Long term (current) use of aspirin: Secondary | ICD-10-CM | POA: Insufficient documentation

## 2024-01-05 DIAGNOSIS — I2510 Atherosclerotic heart disease of native coronary artery without angina pectoris: Secondary | ICD-10-CM | POA: Insufficient documentation

## 2024-01-05 DIAGNOSIS — I129 Hypertensive chronic kidney disease with stage 1 through stage 4 chronic kidney disease, or unspecified chronic kidney disease: Secondary | ICD-10-CM | POA: Insufficient documentation

## 2024-01-05 DIAGNOSIS — R1084 Generalized abdominal pain: Secondary | ICD-10-CM | POA: Insufficient documentation

## 2024-01-05 DIAGNOSIS — R42 Dizziness and giddiness: Secondary | ICD-10-CM | POA: Diagnosis not present

## 2024-01-05 DIAGNOSIS — R0602 Shortness of breath: Secondary | ICD-10-CM | POA: Diagnosis not present

## 2024-01-05 DIAGNOSIS — Z79899 Other long term (current) drug therapy: Secondary | ICD-10-CM | POA: Insufficient documentation

## 2024-01-05 DIAGNOSIS — R197 Diarrhea, unspecified: Secondary | ICD-10-CM | POA: Diagnosis not present

## 2024-01-05 LAB — CBC
HCT: 40.2 % (ref 39.0–52.0)
Hemoglobin: 13.5 g/dL (ref 13.0–17.0)
MCH: 31.5 pg (ref 26.0–34.0)
MCHC: 33.6 g/dL (ref 30.0–36.0)
MCV: 93.7 fL (ref 80.0–100.0)
Platelets: 293 10*3/uL (ref 150–400)
RBC: 4.29 MIL/uL (ref 4.22–5.81)
RDW: 13.3 % (ref 11.5–15.5)
WBC: 7.9 10*3/uL (ref 4.0–10.5)
nRBC: 0 % (ref 0.0–0.2)

## 2024-01-05 LAB — BASIC METABOLIC PANEL WITH GFR
Anion gap: 9 (ref 5–15)
BUN: 19 mg/dL (ref 6–20)
CO2: 23 mmol/L (ref 22–32)
Calcium: 9.2 mg/dL (ref 8.9–10.3)
Chloride: 106 mmol/L (ref 98–111)
Creatinine, Ser: 1.35 mg/dL — ABNORMAL HIGH (ref 0.61–1.24)
GFR, Estimated: >60 mL/min (ref >60)
Glucose, Bld: 99 mg/dL (ref 70–99)
Potassium: 3.9 mmol/L (ref 3.5–5.1)
Sodium: 138 mmol/L (ref 135–145)

## 2024-01-05 LAB — URINALYSIS, ROUTINE W REFLEX MICROSCOPIC
Bilirubin Urine: NEGATIVE
Glucose, UA: NEGATIVE mg/dL
Hgb urine dipstick: NEGATIVE
Ketones, ur: NEGATIVE mg/dL
Leukocytes,Ua: NEGATIVE
Nitrite: NEGATIVE
Protein, ur: NEGATIVE mg/dL
Specific Gravity, Urine: 1.015 (ref 1.005–1.030)
pH: 6 (ref 5.0–8.0)

## 2024-01-05 LAB — TROPONIN I (HIGH SENSITIVITY)
Troponin I (High Sensitivity): 21 ng/L — ABNORMAL HIGH (ref <18)
Troponin I (High Sensitivity): 23 ng/L — ABNORMAL HIGH (ref <18)

## 2024-01-05 LAB — LIPASE, BLOOD: Lipase: 33 U/L (ref 11–51)

## 2024-01-05 MED ORDER — IOHEXOL 350 MG/ML SOLN
75.0000 mL | Freq: Once | INTRAVENOUS | Status: AC | PRN
  Administered 2024-01-05: 75 mL via INTRAVENOUS

## 2024-01-05 MED ORDER — MECLIZINE HCL 25 MG PO TABS
25.0000 mg | ORAL_TABLET | Freq: Once | ORAL | Status: AC
  Administered 2024-01-05: 25 mg via ORAL
  Filled 2024-01-05: qty 1

## 2024-01-05 NOTE — ED Triage Notes (Signed)
 Dizziness, Productive cough and SOB x 1 week. Chest pain with coughing and left sided shoulder pain

## 2024-01-05 NOTE — ED Notes (Signed)
Pt given food and drink per request. 

## 2024-01-05 NOTE — ED Provider Triage Note (Signed)
 Emergency Medicine Provider Triage Evaluation Note  Timothy Peck , a 63 y.o. male  was evaluated in triage.  Pt complains of shortness of breath x 1 week.  Patient complains of productive cough, he does experience chest pain with coughing but otherwise denies chest pain.  Denies fevers.  Denies abdominal pain/vomiting.  Was admitted for NSTEMI in March, ran out of his medications after 30 days and has not been taking these.  Review of Systems  Positive: As above Negative: As above  Physical Exam  BP (!) 150/97 (BP Location: Right Arm)   Pulse 78   Temp 98.1 F (36.7 C)   Resp 19   Wt 70.6 kg   SpO2 93%   BMI 18.95 kg/m  Gen:   Awake, no distress   Resp:  Normal effort. No adventitious sounds.  MSK:   Moves extremities without difficulty  Other:    Medical Decision Making  Medically screening exam initiated at 1:01 PM.  Appropriate orders placed.  Timothy Peck was informed that the remainder of the evaluation will be completed by another provider, this initial triage assessment does not replace that evaluation, and the importance of remaining in the ED until their evaluation is complete.     Kendrick Pax, [place] 01/05/24 1302

## 2024-01-05 NOTE — Progress Notes (Signed)
 Paged by ED to review CT image:  CT-PE resulted and radiologist read the CT and raised concerns for possible dissection flap just proximal to the LAD stent.  Images reviewed. I am able to appreciate the area of concern. The CT obtained is non gated, obtained during an average HR of 80s-70s, with 1 mm slices. I agree with the radiologist that this CT is not the best to make this diagnosis as there is a very high likelihood for artifact under these conditions.   According the the ED team, Timothy Peck is resting comfortably in bed. He is hemodynamically stable from my chart review. His troponins are 21 and 23 five hours apart. EKG without signs of ischemia in the ara of concern. His clinical picture does not fit with the diagnosis of coronary dissection.  At this time, the disposition for Timothy Peck is not known.   I completed a chart review. I did not physically see the patient. Primary team to page if formal consult is requested   Timothy Gibbs, MD MS  Cardiology [employer]

## 2024-01-05 NOTE — ED Notes (Signed)
 Patient transported to CT

## 2024-01-05 NOTE — ED Provider Notes (Signed)
 [place] [facility] Provider Note   CSN: [id number] Arrival date & time: 01/05/24  1217     History  Chief Complaint  Patient presents with   Dizziness   Shortness of Breath   Chest Pain    Timothy Peck is a 63 y.o. male with past medical history of polysubstance abuse, CAD, HTN, HLD, bilateral PE (2014), CVA (2015 on ASA), CKD 3a, homelessness presents to emergency department for evaluation of multiple complaints to include: dizziness, chest pain, shortness of breath, generalized abdominal pain, diarrhea.  Reports that chest pain started 5 days ago and is exertional.  Pain is described as "pricking on the left side".  Has associated shortness of breath that worsens with exertion.  Denies productive cough, congestion, fevers.  Also complains of intermittent dizziness that occurs at rest but worsens with position over the past 3 weeks.  Reports that he typically gets dizziness with migraines but denies current migraine, HA.  Also states that he believes it is due to not having his glasses as they were lost.  Also endorses nausea, diarrhea x 2 each since yesterday.  No vomiting, blood in stool  Last echo 10/2023 shows LV EF 65-70% with normal LV function, grade 1 diastolic dysfunction, moderate LVH. 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   Dizziness Associated symptoms: chest pain and shortness of breath   Shortness of Breath Associated symptoms: chest pain   Chest Pain Associated symptoms: dizziness and shortness of breath       Home Medications Prior to Admission medications   Medication Sig Start Date End Date Taking? Authorizing Provider  amLODipine  (NORVASC ) 5 MG tablet Take 1 tablet (5 mg total) by mouth daily. Patient not taking: Reported on 01/05/2024 10/31/23   Jonelle Neri, DO  aspirin  EC 81 MG tablet Take 1 tablet (81 mg total) by mouth daily. Swallow whole. Patient not taking: Reported on 01/05/2024 10/31/23    Jonelle Neri, DO  atorvastatin  (LIPITOR ) 80 MG tablet Take 1 tablet (80 mg total) by mouth daily. Patient not taking: Reported on 01/05/2024 10/31/23   Jonelle Neri, DO  lidocaine  (LIDODERM ) 5 % Place 1 patch onto the skin daily. Remove & Discard patch within 12 hours or as directed by MD Patient not taking: Reported on 01/05/2024 10/31/23   Lanney Pitts, DO      Allergies    Ibuprofen    Review of Systems   Review of Systems  Respiratory:  Positive for shortness of breath.   Cardiovascular:  Positive for chest pain.  Neurological:  Positive for dizziness.    Physical Exam Updated Vital Signs BP 129/76   Pulse 76   Temp 98 F (36.7 C) (Oral)   Resp 14   Wt 70.6 kg   SpO2 100%   BMI 18.95 kg/m  Physical Exam Vitals and nursing note reviewed.  Constitutional:      General: He is not in acute distress.    Appearance: Normal appearance. He is not ill-appearing.     Comments: On my exam, is sleeping in bed with covers over his head  HENT:     Head: Normocephalic and atraumatic. No raccoon eyes or Battle's sign.  Eyes:     Extraocular Movements:     Right eye: Normal extraocular motion and no nystagmus.     Left eye: Normal extraocular motion and no nystagmus.     Conjunctiva/sclera: Conjunctivae normal.  Cardiovascular:     Rate and Rhythm: Normal  rate.     Heart sounds: Normal heart sounds, S1 normal and S2 normal. No murmur heard. Pulmonary:     Effort: Pulmonary effort is normal. No respiratory distress.     Breath sounds: Normal breath sounds.     Comments: Speaking in full complete sentences without difficulty.  Maintaining oxygen saturation without supplementation.  No signs of respiratory distress nor tachypnea. Chest:     Chest wall: No tenderness.  Abdominal:     General: Bowel sounds are normal.     Palpations: Abdomen is soft.     Tenderness: There is generalized abdominal tenderness. There is no right CVA tenderness, left CVA tenderness, guarding or rebound.      Comments: Nonsurgical abdomen with no peritoneal signs  Musculoskeletal:     Right lower leg: No edema.     Left lower leg: No edema.  Skin:    General: Skin is warm.     Capillary Refill: Capillary refill takes less than 2 seconds.     Coloration: Skin is not jaundiced or pale.  Neurological:     Mental Status: He is alert and oriented to person, place, and time. Mental status is at baseline.     Sensory: Sensory deficit present.     Motor: Weakness present.     Comments: Complains of "room spinning sensation".  Follows commands appropriately.  Decreased grip strength of LUE when compared to RUE.  Sensation 2/2 of BUE.  Sensation 1/2 of LLE when compared to RLE. Motor 4/5 of LLE when compared to RLE     ED Results / Procedures / Treatments   Labs (all labs ordered are listed, but only abnormal results are displayed) Labs Reviewed  BASIC METABOLIC PANEL WITH GFR - Abnormal; Notable for the following components:      Result Value   Creatinine, Ser 1.35 (*)    All other components within normal limits  RAPID URINE DRUG SCREEN, HOSP PERFORMED - Abnormal; Notable for the following components:   Cocaine POSITIVE (*)    Tetrahydrocannabinol POSITIVE (*)    All other components within normal limits  TROPONIN I (HIGH SENSITIVITY) - Abnormal; Notable for the following components:   Troponin I (High Sensitivity) 21 (*)    All other components within normal limits  TROPONIN I (HIGH SENSITIVITY) - Abnormal; Notable for the following components:   Troponin I (High Sensitivity) 23 (*)    All other components within normal limits  CBC  LIPASE, BLOOD  URINALYSIS, ROUTINE W REFLEX MICROSCOPIC    EKG EKG Interpretation Date/Time:  Tuesday January 05 2024 12:30:51 EDT Ventricular Rate:  65 PR Interval:  144 QRS Duration:  88 QT Interval:  410 QTC Calculation: 426 R Axis:   87  Text Interpretation: Normal sinus rhythm Biatrial enlargement Minimal voltage criteria for LVH, may be normal  variant ( Cornell product ) Tall T waves in the anterior leads Abnormal ECG When compared with ECG of 30-Oct-2023 10:07, T wave inversion Anterolateral leads is less prominent Confirmed by Alissa April ([id number]) on 01/05/2024 11:29:39 PM  Radiology CT Head Wo Contrast Result Date: 01/06/2024 CLINICAL DATA:  Neuro deficit, acute, stroke suspected EXAM: CT HEAD WITHOUT CONTRAST TECHNIQUE: Contiguous axial images were obtained from the base of the skull through the vertex without intravenous contrast. RADIATION DOSE REDUCTION: This exam was performed according to the departmental dose-optimization program which includes automated exposure control, adjustment of the mA and/or kV according to patient size and/or use of iterative reconstruction technique. COMPARISON:  12/27/2018 FINDINGS:  Brain: Stable encephalomalacia of within the right parietal, posterior temporal, and occipital cortices in keeping with a posterior right MCA territory infarct. No acute intracranial hemorrhage or infarct. No abnormal mass effect or midline shift. No abnormal intra or extra-axial mass lesion. Ventricular size is normal. Cerebellum is unremarkable. Vascular: No hyperdense vessel or unexpected calcification. Skull: Normal. Negative for fracture or focal lesion. Sinuses/Orbits: No acute finding. Other: Mastoid air cells and middle ear cavities are clear. IMPRESSION: 1. No acute intracranial abnormality. 2. Stable remote right MCA territory infarct. Electronically Signed   By: Worthy Heads M.D.   On: 01/06/2024 01:13   CT Angio Chest PE W and/or Wo Contrast Result Date: 01/05/2024 CLINICAL DATA:  High probability pulmonary embolism, dyspnea, productive cough, acute nonlocalized abdominal pain EXAM: CT ANGIOGRAPHY CHEST CT ABDOMEN AND PELVIS WITH CONTRAST TECHNIQUE: Multidetector CT imaging of the chest was performed using the standard protocol during bolus administration of intravenous contrast. Multiplanar CT image reconstructions and  MIPs were obtained to evaluate the vascular anatomy. Multidetector CT imaging of the abdomen and pelvis was performed using the standard protocol during bolus administration of intravenous contrast. RADIATION DOSE REDUCTION: This exam was performed according to the departmental dose-optimization program which includes automated exposure control, adjustment of the mA and/or kV according to patient size and/or use of iterative reconstruction technique. CONTRAST:  75mL OMNIPAQUE  IOHEXOL  350 MG/ML SOLN COMPARISON:  None Available. FINDINGS: CTA CHEST FINDINGS Cardiovascular: Left anterior descending coronary artery stenting has been performed. There is a small septum noted within the origin of the left anterior descending coronary artery which may represent a dissection flap (85/5) though this is not optimally characterized on this examination. No significant coronary artery calcification. Left ventricular hypertrophy. Global cardiac size within normal limits. No pericardial effusion. Central pulmonary arteries are of normal caliber. Thoracic aorta is unremarkable. Mediastinum/Nodes: No enlarged mediastinal, hilar, or axillary lymph nodes. Thyroid  gland, trachea, and esophagus demonstrate no significant findings. Lungs/Pleura: Lungs are clear. No pleural effusion or pneumothorax. Musculoskeletal: No acute bone abnormality. No lytic or blastic bone lesion. Osseous structures are age appropriate. Review of the MIP images confirms the above findings. CT ABDOMEN and PELVIS FINDINGS Hepatobiliary: Scattered tiny cysts within the liver. Liver otherwise unremarkable. Gallbladder unremarkable. No intra or extrahepatic biliary ductal dilation Pancreas: Unremarkable Spleen: Unremarkable Adrenals/Urinary Tract: Adrenal glands are unremarkable. Simple cortical cyst is seen within the left renal midpole for which no follow-up imaging is recommended. The kidneys are otherwise unremarkable. Bladder unremarkable. Stomach/Bowel: Stomach  is within normal limits. Appendix is not clearly identified, however, no secondary signs of appendicitis within the right lower quadrant. No evidence of bowel wall thickening, distention, or inflammatory changes. Vascular/Lymphatic: Aortic atherosclerosis. No enlarged abdominal or pelvic lymph nodes. Reproductive: Prostate is unremarkable. Other: No abdominal wall hernia Musculoskeletal: No acute bone abnormality. No lytic or blastic bone lesion. Osseous structures are age appropriate. Review of the MIP images confirms the above findings. IMPRESSION: 1. No pulmonary embolism. No acute intrathoracic pathology identified. 2. Status post left anterior descending coronary artery stenting. There is a small septum noted within the origin of the left anterior descending coronary artery which may represent a dissection flap though this is not optimally characterized on this examination. Gated CT or formal arteriography may be helpful for confirmation. 3. No acute intra-abdominal pathology identified. 4. Aortic atherosclerosis. Aortic Atherosclerosis (ICD10-I70.0). Electronically Signed   By: Worthy Heads M.D.   On: 01/05/2024 20:03   CT ABDOMEN PELVIS W CONTRAST Result Date: 01/05/2024 CLINICAL DATA:  High probability pulmonary embolism, dyspnea, productive cough, acute nonlocalized abdominal pain EXAM: CT ANGIOGRAPHY CHEST CT ABDOMEN AND PELVIS WITH CONTRAST TECHNIQUE: Multidetector CT imaging of the chest was performed using the standard protocol during bolus administration of intravenous contrast. Multiplanar CT image reconstructions and MIPs were obtained to evaluate the vascular anatomy. Multidetector CT imaging of the abdomen and pelvis was performed using the standard protocol during bolus administration of intravenous contrast. RADIATION DOSE REDUCTION: This exam was performed according to the departmental dose-optimization program which includes automated exposure control, adjustment of the mA and/or kV  according to patient size and/or use of iterative reconstruction technique. CONTRAST:  75mL OMNIPAQUE  IOHEXOL  350 MG/ML SOLN COMPARISON:  None Available. FINDINGS: CTA CHEST FINDINGS Cardiovascular: Left anterior descending coronary artery stenting has been performed. There is a small septum noted within the origin of the left anterior descending coronary artery which may represent a dissection flap (85/5) though this is not optimally characterized on this examination. No significant coronary artery calcification. Left ventricular hypertrophy. Global cardiac size within normal limits. No pericardial effusion. Central pulmonary arteries are of normal caliber. Thoracic aorta is unremarkable. Mediastinum/Nodes: No enlarged mediastinal, hilar, or axillary lymph nodes. Thyroid  gland, trachea, and esophagus demonstrate no significant findings. Lungs/Pleura: Lungs are clear. No pleural effusion or pneumothorax. Musculoskeletal: No acute bone abnormality. No lytic or blastic bone lesion. Osseous structures are age appropriate. Review of the MIP images confirms the above findings. CT ABDOMEN and PELVIS FINDINGS Hepatobiliary: Scattered tiny cysts within the liver. Liver otherwise unremarkable. Gallbladder unremarkable. No intra or extrahepatic biliary ductal dilation Pancreas: Unremarkable Spleen: Unremarkable Adrenals/Urinary Tract: Adrenal glands are unremarkable. Simple cortical cyst is seen within the left renal midpole for which no follow-up imaging is recommended. The kidneys are otherwise unremarkable. Bladder unremarkable. Stomach/Bowel: Stomach is within normal limits. Appendix is not clearly identified, however, no secondary signs of appendicitis within the right lower quadrant. No evidence of bowel wall thickening, distention, or inflammatory changes. Vascular/Lymphatic: Aortic atherosclerosis. No enlarged abdominal or pelvic lymph nodes. Reproductive: Prostate is unremarkable. Other: No abdominal wall hernia  Musculoskeletal: No acute bone abnormality. No lytic or blastic bone lesion. Osseous structures are age appropriate. Review of the MIP images confirms the above findings. IMPRESSION: 1. No pulmonary embolism. No acute intrathoracic pathology identified. 2. Status post left anterior descending coronary artery stenting. There is a small septum noted within the origin of the left anterior descending coronary artery which may represent a dissection flap though this is not optimally characterized on this examination. Gated CT or formal arteriography may be helpful for confirmation. 3. No acute intra-abdominal pathology identified. 4. Aortic atherosclerosis. Aortic Atherosclerosis (ICD10-I70.0). Electronically Signed   By: Worthy Heads M.D.   On: 01/05/2024 20:03   DG Chest 2 View Result Date: 01/05/2024 CLINICAL DATA:  Chest pain and shortness of breath. EXAM: CHEST - 2 VIEW COMPARISON:  October 30, 2023. FINDINGS: The heart size and mediastinal contours are within normal limits. Both lungs are clear. The visualized skeletal structures are unremarkable. IMPRESSION: No active cardiopulmonary disease. Electronically Signed   By: Rosalene Colon M.D.   On: 01/05/2024 14:28    Procedures Procedures    Medications Ordered in ED Medications  iohexol  (OMNIPAQUE ) 350 MG/ML injection 75 mL (75 mLs Intravenous Contrast Given 01/05/24 1945)  meclizine  (ANTIVERT ) tablet 25 mg (25 mg Oral Given 01/05/24 2310)    ED Course/ Medical Decision Making/ A&P Clinical Course as of 01/06/24 0119  Wed Jan 06, 2024  0118 Creatinine(!): 1.35  Baseline 1.26-1.84 [LB]    Clinical Course User Index [LB] Royann Cords, PA                                 Medical Decision Making Amount and/or Complexity of Data Reviewed Labs: ordered. Radiology: ordered.  Risk Prescription drug management.   Patient presents to the ED for concern of CP, SHOB, dizziness, this involves an extensive number of treatment options, and is a  complaint that carries with it a high risk of complications and morbidity.  The differential diagnosis includes ACS, PE, pneumonia, fluid overload, COPD exacerbation, symptomatic anemia, electrolyte abnormality   Co morbidities that complicate the patient evaluation  See HPI   Additional history obtained:  Additional history obtained from Nursing and Past Admission   External records from outside source obtained and reviewed including triage note, recent admission for NSTEMI   Lab Tests:  I Ordered, and personally interpreted labs.  The pertinent results include:   Troponin 21-23 Creatinine 1.35   Imaging Studies ordered:  I ordered imaging studies including CXR, CTPE, CT abd pelvis  I independently visualized and interpreted imaging which showed  Status post left anterior descending coronary artery stenting. There is a small septum noted within the origin of the left anterior descending coronary artery which may represent a dissection flap I agree with the radiologist interpretation   Cardiac Monitoring:  The patient was maintained on a cardiac monitor.  I personally viewed and interpreted the cardiac monitored which showed an underlying rhythm of: NSR with LVH and T wave inversion of V6 per previous EKG     Consultations Obtained:  I requested consultation with cardiology Dr. Velvet Gibbs,  and discussed lab and imaging findings as well as pertinent plan -  Reviewed CT and reported low suspicion for coronary dissection especially in setting of patient appearing under no distress, pain See her note I requested consultation with neurology Dr. Shane Darling, I discussed lab and imaging findings as well as current plan they recommend- CT head as it should show signs of stroke as symptoms have been going on for 3 weeks   Problem List / ED Course:  CP SHOB Does not appear in respiratory distress.  Patient is sleeping throughout ED visit.  No tachypnea nor hypoxia.   Maintaining oxygen saturation without supplementation nor difficulty. Troponins flat at 21 and 23 and per his baseline of 32-34 two months ago. Reviewed troponins and EKGs with cardiology who are reassured with no new EKG changes and flat trops CTPE negative for effusion, PNA. Did question coronary artery dissection but cardiology reviewed CT and does not believe it is an adequate test to determine this Reports improvement of chest pain and shortness of breath while resting comfortably in ER  Abd pain Generalized abdominal pain with 2 episodes of diarrhea since yesterday.  No vomiting.  No vomiting while in ED CT negative for any intra-abdominal pathology Passed PO challenge eating sandwich, applesauce, drinks   Dizziness Stroke 2015 with no residual deficits. Worsens with positional change but is also present at rest Orthostatics negative but complains of dizziness with changes in position Upon reassessment, patient now reports that he has been having left-sided weakness for the past 3 weeks.  He did not initially provide this information when I asked him if you been having weakness Decreased sensation of LLE and decreased motor of LUE and LLE Provided dose of meclizine  without much improvement  in hour post meclizine  Exam is not 100% consistent as there seems to be a component of him not putting full effort into examination with possible contributing factor of polysubstance abuse, malingering as he is homeless.  However, consulted neurology and will obtain CT head to ensure no stroke occurred. CT pending UDS + for cocaine and THC Will reassess patient following continued meclizine    Reevaluation:  After the interventions noted above, I reevaluated the patient and found that they have :stayed the same   Social Determinants of Health:  homeless   Dispostion:  I discussed ED workup with patient expressed understanding agrees with plan.  After consideration of the diagnostic results  and the patients response to treatment, I feel that the patent would likely be safe to be discharged and follow-up with neurology outpatient assuming no abnormalities on CT and dizziness improves.  If he is unable to walk as he is so acutely dizzy, will need to consider metabolism to freedom versus admission for this.  Sign out to Lueders PA pending dispo, completion of CT  Final Clinical Impression(s) / ED Diagnoses Final diagnoses:  Dizziness  Chest pain, unspecified type  Generalized abdominal pain  Diarrhea, unspecified type  Shortness of breath    Rx / DC Orders ED Discharge Orders     None         Royann Cords, PA 01/06/24 0120    Carin Charleston, MD 01/12/24 1119

## 2024-01-06 ENCOUNTER — Emergency Department (HOSPITAL_COMMUNITY): Payer: MEDICAID

## 2024-01-06 LAB — RAPID URINE DRUG SCREEN, HOSP PERFORMED
Amphetamines: NOT DETECTED
Barbiturates: NOT DETECTED
Benzodiazepines: NOT DETECTED
Cocaine: POSITIVE — AB
Opiates: NOT DETECTED
Tetrahydrocannabinol: POSITIVE — AB

## 2024-01-06 NOTE — ED Notes (Signed)
 Patient transported to CT

## 2024-01-06 NOTE — ED Provider Notes (Signed)
  Physical Exam  BP 129/76   Pulse 73   Temp 98 F (36.7 C) (Oral)   Resp 15   Wt 70.6 kg   SpO2 99%   BMI 18.95 kg/m   Physical Exam  Procedures  Procedures  ED Course / MDM   Clinical Course as of 01/06/24 0247  Wed Jan 06, 2024  0118 Creatinine(!): 1.35 Baseline 1.26-1.84 [LB]    Clinical Course User Index [LB] Royann Cords, PA   Medical Decision Making Amount and/or Complexity of Data Reviewed Labs: ordered. Decision-making details documented in ED Course. Radiology: ordered.  Risk Prescription drug management.  Signout from PA Sheralyn Dies, patient with pending head CT.  Came in initially for chest pain, workup reassuring and cardiology consulted regarding abnormal CT finding.  Patient later complaining of headache, 3 weeks of left-sided weakness and dizziness.  Neurology consulted who recommended CT head.  Given duration, CT felt to be adequate to rule out stroke.  Plan to get CT, if normal will reassess and plan on discharge.  Was able to ambulate without assistance, he is feeling better and only having minimal dizziness.  Will discharge home for outpatient follow-up.  On reassessment patient has no complaints to me.        Aimee Houseman, PA-C 01/06/24 0247    Alissa April, MD 01/06/24 [phone]

## 2024-01-06 NOTE — ED Notes (Signed)
 Pt ambulated in hall to desk and back to room with steady gait.  States slight dizziness but no assistance needed during ambulation.

## 2024-01-06 NOTE — Discharge Instructions (Addendum)
 You are seen in the ER today for evaluation of dizziness, chest pain, generalized abdominal pain and shortness of breath.  Fortunately your workup was all very reassuring.  We are glad you are feeling better.  Is important to stop using cocaine.  Follow-up closely with your cardiologist.  Come back to the ER for new or worsening symptoms

## 2024-01-06 NOTE — ED Notes (Signed)
 Pt returned from CT
